# Patient Record
Sex: Male | Born: 1935 | Hispanic: Yes | Marital: Married | Smoking: Never smoker
Health system: Southern US, Community
[De-identification: ages and names within clinical notes are randomized; demographics above are authoritative.]

## PROBLEM LIST (undated history)

## (undated) DIAGNOSIS — I2510 Atherosclerotic heart disease of native coronary artery without angina pectoris: Secondary | ICD-10-CM

## (undated) DIAGNOSIS — N189 Chronic kidney disease, unspecified: Secondary | ICD-10-CM

## (undated) DIAGNOSIS — H903 Sensorineural hearing loss, bilateral: Secondary | ICD-10-CM

## (undated) DIAGNOSIS — K219 Gastro-esophageal reflux disease without esophagitis: Secondary | ICD-10-CM

## (undated) DIAGNOSIS — I471 Supraventricular tachycardia, unspecified: Secondary | ICD-10-CM

## (undated) DIAGNOSIS — K4090 Unilateral inguinal hernia, without obstruction or gangrene, not specified as recurrent: Secondary | ICD-10-CM

## (undated) DIAGNOSIS — K429 Umbilical hernia without obstruction or gangrene: Secondary | ICD-10-CM

## (undated) DIAGNOSIS — M1712 Unilateral primary osteoarthritis, left knee: Secondary | ICD-10-CM

## (undated) DIAGNOSIS — I5030 Unspecified diastolic (congestive) heart failure: Secondary | ICD-10-CM

## (undated) DIAGNOSIS — I340 Nonrheumatic mitral (valve) insufficiency: Secondary | ICD-10-CM

## (undated) DIAGNOSIS — T847XXA Infection and inflammatory reaction due to other internal orthopedic prosthetic devices, implants and grafts, initial encounter: Secondary | ICD-10-CM

## (undated) DIAGNOSIS — Z95810 Presence of automatic (implantable) cardiac defibrillator: Secondary | ICD-10-CM

## (undated) DIAGNOSIS — I5189 Other ill-defined heart diseases: Secondary | ICD-10-CM

## (undated) DIAGNOSIS — K5790 Diverticulosis of intestine, part unspecified, without perforation or abscess without bleeding: Secondary | ICD-10-CM

## (undated) DIAGNOSIS — N281 Cyst of kidney, acquired: Secondary | ICD-10-CM

## (undated) DIAGNOSIS — Z87448 Personal history of other diseases of urinary system: Secondary | ICD-10-CM

## (undated) DIAGNOSIS — I428 Other cardiomyopathies: Secondary | ICD-10-CM

## (undated) DIAGNOSIS — R7303 Prediabetes: Secondary | ICD-10-CM

## (undated) DIAGNOSIS — M109 Gout, unspecified: Secondary | ICD-10-CM

## (undated) DIAGNOSIS — Z974 Presence of external hearing-aid: Secondary | ICD-10-CM

## (undated) DIAGNOSIS — Z7901 Long term (current) use of anticoagulants: Secondary | ICD-10-CM

## (undated) DIAGNOSIS — E785 Hyperlipidemia, unspecified: Secondary | ICD-10-CM

## (undated) DIAGNOSIS — I6789 Other cerebrovascular disease: Secondary | ICD-10-CM

## (undated) DIAGNOSIS — I700 Atherosclerosis of aorta: Secondary | ICD-10-CM

## (undated) DIAGNOSIS — L409 Psoriasis, unspecified: Secondary | ICD-10-CM

## (undated) DIAGNOSIS — M1990 Unspecified osteoarthritis, unspecified site: Secondary | ICD-10-CM

## (undated) DIAGNOSIS — N183 Chronic kidney disease, stage 3 unspecified: Secondary | ICD-10-CM

## (undated) DIAGNOSIS — E039 Hypothyroidism, unspecified: Secondary | ICD-10-CM

## (undated) DIAGNOSIS — I38 Endocarditis, valve unspecified: Secondary | ICD-10-CM

## (undated) DIAGNOSIS — I10 Essential (primary) hypertension: Secondary | ICD-10-CM

## (undated) DIAGNOSIS — I209 Angina pectoris, unspecified: Secondary | ICD-10-CM

## (undated) DIAGNOSIS — I491 Atrial premature depolarization: Secondary | ICD-10-CM

## (undated) DIAGNOSIS — G459 Transient cerebral ischemic attack, unspecified: Secondary | ICD-10-CM

## (undated) DIAGNOSIS — K4020 Bilateral inguinal hernia, without obstruction or gangrene, not specified as recurrent: Secondary | ICD-10-CM

## (undated) DIAGNOSIS — N400 Enlarged prostate without lower urinary tract symptoms: Secondary | ICD-10-CM

## (undated) HISTORY — PX: BUNIONECTOMY: SHX129

## (undated) HISTORY — PX: CATARACT EXTRACTION W/ INTRAOCULAR LENS  IMPLANT, BILATERAL: SHX1307

## (undated) HISTORY — DX: Hyperlipidemia, unspecified: E78.5

## (undated) HISTORY — DX: Personal history of other diseases of urinary system: Z87.448

## (undated) HISTORY — DX: Unspecified osteoarthritis, unspecified site: M19.90

## (undated) HISTORY — DX: Essential (primary) hypertension: I10

## (undated) HISTORY — PX: JOINT REPLACEMENT: SHX530

## (undated) HISTORY — PX: EYE SURGERY: SHX253

---

## 2012-10-06 DIAGNOSIS — R079 Chest pain, unspecified: Secondary | ICD-10-CM | POA: Insufficient documentation

## 2012-10-06 DIAGNOSIS — E785 Hyperlipidemia, unspecified: Secondary | ICD-10-CM | POA: Insufficient documentation

## 2012-10-09 DIAGNOSIS — I209 Angina pectoris, unspecified: Secondary | ICD-10-CM | POA: Insufficient documentation

## 2013-03-18 DIAGNOSIS — I2510 Atherosclerotic heart disease of native coronary artery without angina pectoris: Secondary | ICD-10-CM | POA: Insufficient documentation

## 2013-03-19 DIAGNOSIS — K219 Gastro-esophageal reflux disease without esophagitis: Secondary | ICD-10-CM | POA: Insufficient documentation

## 2013-11-18 DIAGNOSIS — N189 Chronic kidney disease, unspecified: Secondary | ICD-10-CM | POA: Insufficient documentation

## 2014-02-17 DIAGNOSIS — I491 Atrial premature depolarization: Secondary | ICD-10-CM | POA: Insufficient documentation

## 2014-02-17 DIAGNOSIS — R5382 Chronic fatigue, unspecified: Secondary | ICD-10-CM | POA: Insufficient documentation

## 2014-08-17 ENCOUNTER — Other Ambulatory Visit: Payer: Self-pay | Admitting: Unknown Physician Specialty

## 2014-08-17 DIAGNOSIS — I10 Essential (primary) hypertension: Secondary | ICD-10-CM

## 2014-08-17 DIAGNOSIS — N183 Chronic kidney disease, stage 3 unspecified: Secondary | ICD-10-CM

## 2014-08-18 ENCOUNTER — Ambulatory Visit (INDEPENDENT_AMBULATORY_CARE_PROVIDER_SITE_OTHER): Payer: Self-pay | Admitting: Unknown Physician Specialty

## 2014-08-18 ENCOUNTER — Encounter: Payer: Self-pay | Admitting: Unknown Physician Specialty

## 2014-08-18 VITALS — BP 144/83 | HR 64 | Temp 98.2°F | Ht 65.0 in | Wt 184.0 lb

## 2014-08-18 DIAGNOSIS — I10 Essential (primary) hypertension: Secondary | ICD-10-CM | POA: Insufficient documentation

## 2014-08-18 DIAGNOSIS — M545 Low back pain: Secondary | ICD-10-CM

## 2014-08-18 DIAGNOSIS — L409 Psoriasis, unspecified: Secondary | ICD-10-CM | POA: Insufficient documentation

## 2014-08-18 DIAGNOSIS — E785 Hyperlipidemia, unspecified: Secondary | ICD-10-CM

## 2014-08-18 DIAGNOSIS — N183 Chronic kidney disease, stage 3 unspecified: Secondary | ICD-10-CM | POA: Insufficient documentation

## 2014-08-18 HISTORY — DX: Hyperlipidemia, unspecified: E78.5

## 2014-08-18 LAB — MICROALBUMIN, URINE WAIVED
Creatinine, Urine Waived: 100 mg/dL (ref 10–300)
Microalb, Ur Waived: 80 mg/L — ABNORMAL HIGH (ref 0–19)

## 2014-08-18 MED ORDER — LISINOPRIL 5 MG PO TABS: 5.0000 mg | ORAL_TABLET | Freq: Every day | ORAL | Status: DC

## 2014-08-18 NOTE — Assessment & Plan Note (Signed)
BP is borderline.  Will need to add ACE to help with CKD

## 2014-08-18 NOTE — Patient Instructions (Addendum)
Start Lisinopril 5 mg QD to improve blood pressure control and protect your kidneys.  Recheck in 1 month for blood pressure and tolerance to medication.     Dolor de espalda en el adulto (Back Pain, Adult)  El dolor de cintura es frecuente. Aproximadamente 1 de cada 5 personas lo sufren.La causa rara vez pone en peligro la vida. Con frecuencia mejora luego de algn tiempo.Alrededor de la mitad de las personas que sufren un inicio sbito de dolor de cintura, se sentirn mejor luego de 2 semanas. Aproximadamente 8 de cada 10 se sentirn mejor luego de 6 semanas.  CAUSAS  Algunas causas comunes son:   Distensin de los msculos o ligamentos que sostienen la columna vertebral.  Desgaste (degeneracin) de los discos vertebrales.  Artritis.  Traumatismos directos en la espalda. DIAGNSTICO  La mayor parte de las veces, la causa directa no se conoce.Sin embargo, Chief Technology Officer puede tratarse efectivamente an cuando no se Best boy.Una de las formas ms precisas de asegurar que la causa del dolor no constituye un peligro es responder a las preguntas del mdico acerca de su salud y sus sntomas. Si el mdico necesita ms informacin, podr indicar anlisis de laboratorio o Education officer, environmental un diagnstico por imgenes (radiografas o Health visitor).Sin embargo, aunque las [employer] modificaciones, generalmente no es necesaria la [place].  INSTRUCCIONES PARA EL CUIDADO EN EL HOGAR  En algunas personas, el dolor de espalda vuelve.Como rara vez es peligroso, los pacientes pueden aprender a Interior and spatial designer.   Mantngase activo. Si permanece sentado o de pie mucho tiempo en el mismo lugar, se tensiona la espalda.  No se siente, maneje ni se quede parado en un mismo lugar por ms de 30 minutos. Realice caminatas cortas en superficies planas ni bien el dolor haya cedido. Trate de Copy tiempo que camina .  No se quede en la cama.Si hace reposo durante ms de 1 o 2 das,  puede Estate agent.  No evite los ejercicios ni el trabajo.El cuerpo est hecho para moverse.No es peligroso estar [place], aunque le duela la espalda.La espalda se curar ms rpido si contina sus actividades antes de que el dolor se vaya.  Preste atencin a su cuerpo cuando se incline y se levante. Muchas personas sienten menos molestias cuando levantan objetos si doblan las rodillas, mantienen la carga cerca del cuerpo y evitan torcerse. Generalmente, las posiciones ms cmodas son las que ejercen menos tensin en la espalda en recuperacin.  Encuentre una posicin cmoda para dormir. Utilice un colchn firme y recustese de [place]. Doble ligeramente sus rodillas. Si se recuesta sobre su espalda, coloque una almohada debajo de sus rodillas.  Tome slo medicamentos de venta libre o recetados, segn las indicaciones del mdico. Los medicamentos de venta libre para Primary school teacher y reducir Futures trader, son los que en general ms ayudan.El mdico podr prescribirle relajantes musculares.Estos medicamentos calman el dolor de modo que pueda retornar a sus actividades normales y a Investment banker, operational.  Aplique hielo sobre la zona lesionada.  Ponga el hielo en una bolsa plstica.  Colquese una toalla entre la piel y la bolsa de hielo.  Deje la bolsa de hielo durante 15 a 20 minutos 3 a 4 veces por da, durante los primeros 2  3 das. Luego podr alternar Eusebio Me calor y hielo para reducir Chief Technology Officer y los espasmos.  Consulte a su mdico si puede tratar de hacer ejercicios para la espalda y recibir un masaje suave. Pueden ser  beneficiosos.  Evite sentirse ansioso o estresado.El estrs aumenta la tensin muscular y puede empeorar el dolor de espalda.Es importante reconocer cuando est ansioso o estresado y aprender la forma de controlarlos.El ejercicio es una gran opcin. SOLICITE ATENCIN MDICA SI:   Siente un dolor que no se alivia con reposo o medicamentos.  El  dolor no mejora en 1 semana.  Desarrolla nuevos sntomas.  No se siente bien en general. SOLICITE ATENCIN MDICA DE INMEDIATO SI:  Siente un dolor que se irradia desde la espalda hacia sus piernas.  Desarrolla nuevos problemas en el intestino o la vejiga.  Siente debilidad o adormecimiento inusual en sus brazos o piernas.  Presenta nuseas o vmitos.  Presenta dolor abdominal.  Se siente desfalleciente. Document Released: 02/27/2005 Document Revised: 08/29/2011 [facility] Patient Information 2015 [place], [place]. This information is not intended to replace advice given to you by your health care provider. Make sure you discuss any questions you have with your health care provider. Ejercicios para la espalda (Back Exercises) Estos ejercicios ayudan a tratar y a prevenir lesiones en la espalda. El objetivo es aumentar la fuerza en los msculos del vientre (abdomen) y de la espalda. Estos ejercicios tambin lo ayudarn a mejorar la flexibilidad. Comience a realizar estos ejercicios cuando el mdico se lo indique. CUIDADOS EN EL HOGAR Los ejercicios para la espalda incluyen: Inclinacin de la pelvis.  Recustese sobre la espalda con las rodillas flexionadas. Incline la pelvis hasta que la parte inferior de la espalda se apoye en el piso. Mantenga esta posicin durante 5 a 10 segundos. Repita este ejercicio 5 a 10 veces. Rodilla al pecho.  Empuje con la rodilla contra el pecho y [place] posicin durante 20 a 30 segundos. Repita con la otra pierna. Esto puede realizarlo con la otra pierna extendida o flexionada, del modo en que se sienta ms cmodo. Luego presione ambas rodillas contra el pecho. Abdominales.  Doble las rodillas a 90 grados. Comience doblando la pelvis y haga un abdominal parcial y en forma lenta. Slo eleve la parte superior a 30  45 grados del suelo. Emplee al [employer] 2 y 3 segundos para cada abdominal. No haga los abdominales con las rodillas extendidas. Si  hacer abdominales parciales le resulta difcil, simplemente haga el ejercicio pero slo endureciendo los msculos del vientre(abdomen) y manteniendo segn la indicacin. Elevar la cadera.  Recustese sobre la espalda con las rodillas flexionadas a 90 grados. Presione con los pies y los hombros a medida que eleva las caderas a 5 cm del suelo. Mantenga durante 10 segundos y repita 5 a 10 veces. Arquear la espalda.  Acustese [employer]. Levntese apoyando los codos doblados. Presione lentamente con las manos, formando un arco con la zona inferior de la espalda. Repita entre 3 y 5 veces. Elevar los hombros.  Acustese boca abajo con los brazos a los lados del cuerpo. Presione las caderas y Dance movement psychotherapist torso contra el suelo mientras eleva lentamente la cabeza y los hombros del suelo. No exagere al [employer] ejercicios. Tenga cuidado al principio. Los ejercicios pueden causar algunas molestias leves en la espalda. Si el dolor dura ms de 15 minutos, detenga los ejercicios hasta que consulte al mdico. Los problemas en la espalda mejoran de L[place] lenta con esta terapia.  Document Released: 06/14/2010 Document Revised: 05/22/2011 [facility] Patient Information 2015 [place], [place]. This information is not intended to replace advice given to you by your health care provider. Make sure you discuss any questions you have with your  health care provider.  

## 2014-08-18 NOTE — Assessment & Plan Note (Signed)
Add ACE.  Check CMP

## 2014-08-18 NOTE — Progress Notes (Signed)
   BP 144/83 mmHg  Pulse 64  Temp(Src) 98.2 F (36.8 C) (Oral)  Ht 5\' 5"  (1.651 m)  Wt 184 lb (83.462 kg)  BMI 30.62 kg/m2  SpO2 96%   Subjective:    Patient ID: Gerald Hodges, male    DOB: October 26, 1935, 79 y.o.   MRN: [id number]  HPI: Gerald Hodges is a 79 y.o. male presenting on 08/18/2014 for Follow-up  [name] is here with his friend who helpt to interpret.    HYPERTENSION / HYPERLIPIDEMIA Satisfied with current treatment? yes Duration of hypertension: chronic BP monitoring frequency: not checking BP range:  BP medication side effects: no Past BP meds: none, amlodipine/benazepril, atenolol, benazepril, benazepril/HCTZ, bisoprolol (bystolic), carvedilol, chlorthalidone, clonidine, diltiazem, exforge HCT, HCTZ, irbesartan (avapro), labetalol, lisinopril, lisinopril-HCTZ, losartan (cozaar), methyldopa, nifedipine, olmesartan (benicar), olmesartan-HCTZ, quinapril, ramipril, spironalactone, tekturna, valsartan, valsartan-HCTZ and verapamil Duration of hyperlipidemia: chronic Cholesterol medication side effects: no Cholesterol supplements: fish oil, niacin and red yeast rice Past cholesterol medications: none, lovastatin (mevacor), pravastatin (pravachol), rosuvastatin (crestor), simvastatin (zocor), vytorin, fenofibrate (tricor), gemfibrozil, ezetimide (zetia), niaspan and lovaza Medication compliance: excellent compliance Aspirin: yes Recent stressors: yes Recurrent headaches: no Visual changes: no Palpitations: no Dyspnea: no Chest pain: no Lower extremity edema: no Dizzy/lightheaded: no   Note that currently is not on an ACE or ARB despite GFR of 47.  He is a new patient to me   Relevant past medical, surgical, family and social history reviewed and updated as indicated. Interim medical history since our last visit reviewed. Allergies and medications reviewed and updated.  Back pain: sometimes when he wakes up.  Has a lot of back pain. Improves through  the dayl    Review of Systems  Per HPI unless specifically indicated above     Objective:    BP 144/83 mmHg  Pulse 64  Temp(Src) 98.2 F (36.8 C) (Oral)  Ht 5\' 5"  (1.651 m)  Wt 184 lb (83.462 kg)  BMI 30.62 kg/m2  SpO2 96%  Wt Readings from Last 3 Encounters:  08/18/14 184 lb (83.462 kg)  10/21/13 183 lb (83.008 kg)    Physical Exam  Constitutional: He is oriented to person, place, and time. He appears well-developed and well-nourished.  HENT:  Head: Normocephalic and atraumatic.  Cardiovascular: Normal rate and normal heart sounds.   Pulmonary/Chest: Effort normal and breath sounds normal.  Neurological: He is oriented to person, place, and time.  Skin: Skin is warm.  Psychiatric: He has a normal mood and affect. His behavior is normal.          Assessment & Plan:   1. Essential hypertension, benign Borderline BP.  Add Ace for kidney protection  2. Chronic kidney disease, stage 3 Check CMP  3. Hyperlipemia Check Lipid panel  - Comprehensive metabolic panel - Lipid panel - Uric acid - Microalbumin / creatinine urine ratio  4.  Back pain Excercises and pt ed given     Follow up plan:  1 month to check BMP and BP

## 2014-08-18 NOTE — Assessment & Plan Note (Signed)
Check Lipid panel 

## 2014-08-19 LAB — COMPREHENSIVE METABOLIC PANEL
ALT: 17 IU/L (ref 0–44)
AST: 20 IU/L (ref 0–40)
Albumin/Globulin Ratio: 1.9 (ref 1.1–2.5)
Albumin: 4.7 g/dL (ref 3.5–4.8)
Alkaline Phosphatase: 70 IU/L (ref 39–117)
BUN/Creatinine Ratio: 15 (ref 10–22)
BUN: 21 mg/dL (ref 8–27)
Bilirubin Total: 0.8 mg/dL (ref 0.0–1.2)
CO2: 26 mmol/L (ref 18–29)
Calcium: 9.8 mg/dL (ref 8.6–10.2)
Chloride: 97 mmol/L (ref 97–108)
Creatinine, Ser: 1.39 mg/dL — ABNORMAL HIGH (ref 0.76–1.27)
GFR calc Af Amer: 57 mL/min/{1.73_m2} — ABNORMAL LOW (ref >59)
GFR calc non Af Amer: 49 mL/min/{1.73_m2} — ABNORMAL LOW (ref >59)
Globulin, Total: 2.5 g/dL (ref 1.5–4.5)
Glucose: 89 mg/dL (ref 65–99)
Potassium: 4.1 mmol/L (ref 3.5–5.2)
Sodium: 142 mmol/L (ref 134–144)
Total Protein: 7.2 g/dL (ref 6.0–8.5)

## 2014-08-19 LAB — LIPID PANEL
Chol/HDL Ratio: 3.4 ratio units (ref 0.0–5.0)
Cholesterol, Total: 124 mg/dL (ref 100–199)
HDL: 36 mg/dL — ABNORMAL LOW (ref >39)
LDL Calculated: 48 mg/dL (ref 0–99)
Triglycerides: 199 mg/dL — ABNORMAL HIGH (ref 0–149)
VLDL Cholesterol Cal: 40 mg/dL (ref 5–40)

## 2014-08-19 LAB — URIC ACID: Uric Acid: 11.5 mg/dL — ABNORMAL HIGH (ref 3.7–8.6)

## 2014-09-29 ENCOUNTER — Ambulatory Visit: Payer: Self-pay | Admitting: Unknown Physician Specialty

## 2014-10-09 ENCOUNTER — Encounter: Payer: Self-pay | Admitting: Unknown Physician Specialty

## 2014-10-09 ENCOUNTER — Ambulatory Visit (INDEPENDENT_AMBULATORY_CARE_PROVIDER_SITE_OTHER): Payer: Self-pay | Admitting: Unknown Physician Specialty

## 2014-10-09 VITALS — BP 151/84 | HR 64 | Temp 98.3°F | Ht 65.5 in | Wt 184.0 lb

## 2014-10-09 DIAGNOSIS — I129 Hypertensive chronic kidney disease with stage 1 through stage 4 chronic kidney disease, or unspecified chronic kidney disease: Secondary | ICD-10-CM

## 2014-10-09 DIAGNOSIS — E785 Hyperlipidemia, unspecified: Secondary | ICD-10-CM

## 2014-10-09 DIAGNOSIS — N183 Chronic kidney disease, stage 3 unspecified: Secondary | ICD-10-CM

## 2014-10-09 DIAGNOSIS — N189 Chronic kidney disease, unspecified: Secondary | ICD-10-CM

## 2014-10-09 DIAGNOSIS — M1A3790 Chronic gout due to renal impairment, unspecified ankle and foot, without tophus (tophi): Secondary | ICD-10-CM

## 2014-10-09 DIAGNOSIS — M109 Gout, unspecified: Secondary | ICD-10-CM | POA: Insufficient documentation

## 2014-10-09 MED ORDER — OMEPRAZOLE 20 MG PO CPDR: 20.0000 mg | DELAYED_RELEASE_CAPSULE | Freq: Every day | ORAL | Status: DC

## 2014-10-09 MED ORDER — CARVEDILOL 6.25 MG PO TABS: 6.2500 mg | ORAL_TABLET | Freq: Two times a day (BID) | ORAL | Status: DC

## 2014-10-09 MED ORDER — NITROGLYCERIN 0.4 MG SL SUBL: 0.4000 mg | SUBLINGUAL_TABLET | SUBLINGUAL | Status: DC | PRN

## 2014-10-09 MED ORDER — ALLOPURINOL 100 MG PO TABS: 100.0000 mg | ORAL_TABLET | Freq: Every day | ORAL | Status: DC

## 2014-10-09 MED ORDER — COLCHICINE 0.6 MG PO TABS
0.6000 mg | ORAL_TABLET | Freq: Every day | ORAL | Status: DC
Start: 2014-10-09 — End: 2015-06-28

## 2014-10-09 MED ORDER — AMLODIPINE BESYLATE 10 MG PO TABS: 10.0000 mg | ORAL_TABLET | Freq: Every day | ORAL | Status: DC

## 2014-10-09 MED ORDER — ISOSORBIDE MONONITRATE ER 60 MG PO TB24: 60.0000 mg | ORAL_TABLET | Freq: Every day | ORAL | Status: DC

## 2014-10-09 MED ORDER — ATORVASTATIN CALCIUM 20 MG PO TABS
20.0000 mg | ORAL_TABLET | Freq: Every day | ORAL | Status: DC
Start: 2014-10-09 — End: 2015-06-28

## 2014-10-09 NOTE — Progress Notes (Signed)
BP 151/84 mmHg  Pulse 64  Temp(Src) 98.3 F (36.8 C)  Ht 5' 5.5" (1.664 m)  Wt 184 lb (83.462 kg)  BMI 30.14 kg/m2  SpO2 98%   Subjective:    Patient ID: Gerald Hodges, male    DOB: 1935-05-26, 79 y.o.   MRN: [id number]  HPI: Gerald Hodges is a 79 y.o. male presenting on 10/09/2014 for Hyperlipidemia; Hypertension; and Medication Refill  [name] is here with a friend and interperter.    HYPERTENSION / HYPERLIPIDEMIA  While here, he brought in Furosemide 40 mg that he takes once a day that was given in [place]  He brought in 3 medicines which we know he is taking but his friend feels he has more medications at home that he did not bring in.  Those medications are Allopurinol, lisinopril, and Furosemide.  Probably taking Amlodipine and Carvedilol  Satisfied with current treatment? yes Duration of hypertension: chronic BP monitoring frequency: not checking BP range:  BP medication side effects: no Past BP meds: none, amlodipine/benazepril, atenolol, benazepril, benazepril/HCTZ, bisoprolol (bystolic), carvedilol, chlorthalidone, clonidine, diltiazem, exforge HCT, HCTZ, irbesartan (avapro), labetalol, lisinopril, lisinopril-HCTZ, losartan (cozaar), methyldopa, nifedipine, olmesartan (benicar), olmesartan-HCTZ, quinapril, ramipril, spironalactone, tekturna, valsartan, valsartan-HCTZ and verapamil Duration of hyperlipidemia: chronic Cholesterol medication side effects: no Cholesterol supplements: fish oil, niacin and red yeast rice Past cholesterol medications: none, lovastatin (mevacor), pravastatin (pravachol), rosuvastatin (crestor), simvastatin (zocor), vytorin, fenofibrate (tricor), gemfibrozil, ezetimide (zetia), niaspan and lovaza Medication compliance: excellent compliance Aspirin: yes Recent stressors: yes Recurrent headaches: no Visual changes: no Palpitations: no Dyspnea: no Chest pain: no Lower extremity edema: no Dizzy/lightheaded: no    CAD Pt with probable CAD.  He has Imdur in his chart and takes Nitroglycerin maybe once int he last month.  I'm not sure he is taking Imdur.   Gout Taking Allopurinol with a gout attack about 15 days ago.  Colchicine is in chart but not sure he is taking.     Relevant past medical, surgical, family and social history reviewed and updated as indicated. Interim medical history since our last visit reviewed. Allergies and medications reviewed and updated.  Back pain:  Middle of the back still hurts a little bit.    Review of Systems  Per HPI unless specifically indicated above     Objective:    BP 151/84 mmHg  Pulse 64  Temp(Src) 98.3 F (36.8 C)  Ht 5' 5.5" (1.664 m)  Wt 184 lb (83.462 kg)  BMI 30.14 kg/m2  SpO2 98%  Wt Readings from Last 3 Encounters:  10/09/14 184 lb (83.462 kg)  08/18/14 184 lb (83.462 kg)  10/21/13 183 lb (83.008 kg)    Physical Exam  Constitutional: He is oriented to person, place, and time. He appears well-developed and well-nourished.  HENT:  Head: Normocephalic and atraumatic.  Cardiovascular: Normal rate and normal heart sounds.   Pulmonary/Chest: Effort normal and breath sounds normal.  Neurological: He is oriented to person, place, and time.  Skin: Skin is warm.  Psychiatric: He has a normal mood and affect. His behavior is normal.    Reviewed his last labs showing a GFR of 49 with normal electrolytes.       Assessment & Plan:   Problem List Items Addressed This Visit      Unprioritized   Chronic kidney disease, stage III (moderate)    Unable to see Nephrology due to insurance.  I will continue to monitor every 3 months.  Continue Furosemide  Hyperlipemia    LDL 48 last visit.  Continue present statin dose      Relevant Medications   atorvastatin (LIPITOR) 20 MG tablet   amLODipine (NORVASC) 10 MG tablet   carvedilol (COREG) 6.25 MG tablet   isosorbide mononitrate (IMDUR) 60 MG 24 hr tablet   nitroGLYCERIN (NITROSTAT) 0.4  MG SL tablet   Benign hypertension with chronic kidney disease - Primary    He seems to be doing well on present medications.  Kidney function is stable.  Will recheck a CMP today      Relevant Medications   atorvastatin (LIPITOR) 20 MG tablet   amLODipine (NORVASC) 10 MG tablet   carvedilol (COREG) 6.25 MG tablet   isosorbide mononitrate (IMDUR) 60 MG 24 hr tablet   nitroGLYCERIN (NITROSTAT) 0.4 MG SL tablet   Gout    Continue present medications           Follow up plan:  Recheck 4 weeks with all of his medications  Check CMP next visit.  Will check every 3 months.  Unable to afford Cardiology or Nephrology at this time-

## 2014-10-09 NOTE — Assessment & Plan Note (Signed)
LDL 48 last visit.  Continue present statin dose

## 2014-10-09 NOTE — Assessment & Plan Note (Signed)
Continue present medications. 

## 2014-10-09 NOTE — Patient Instructions (Addendum)
DASH Eating Plan DASH stands for "Dietary Approaches to Stop Hypertension." The DASH eating plan is a healthy eating plan that has been shown to reduce high blood pressure (hypertension). Additional health benefits may include reducing the risk of type 2 diabetes mellitus, heart disease, and stroke. The DASH eating plan may also help with weight loss. WHAT DO I NEED TO KNOW ABOUT THE DASH EATING PLAN? For the DASH eating plan, you will follow these general guidelines:  Choose foods with a percent daily value for sodium of less than 5% (as listed on the food label).  Use salt-free seasonings or herbs instead of table salt or sea salt.  Check with your health care provider or pharmacist before using salt substitutes.  Eat lower-sodium products, often labeled as "lower sodium" or "no salt added."  Eat fresh foods.  Eat more vegetables, fruits, and low-fat dairy products.  Choose whole grains. Look for the word "whole" as the first word in the ingredient list.  Choose fish and skinless chicken or turkey more often than red meat. Limit fish, poultry, and meat to 6 oz (170 g) each day.  Limit sweets, desserts, sugars, and sugary drinks.  Choose heart-healthy fats.  Limit cheese to 1 oz (28 g) per day.  Eat more home-cooked food and less restaurant, buffet, and fast food.  Limit fried foods.  Cook foods using methods other than frying.  Limit canned vegetables. If you do use them, rinse them well to decrease the sodium.  When eating at a restaurant, ask that your food be prepared with less salt, or no salt if possible. WHAT FOODS CAN I EAT? Seek help from a dietitian for individual calorie needs. Grains Whole grain or whole wheat bread. Brown rice. Whole grain or whole wheat pasta. Quinoa, bulgur, and whole grain cereals. Low-sodium cereals. Corn or whole wheat flour tortillas. Whole grain cornbread. Whole grain crackers. Low-sodium crackers. Vegetables Fresh or frozen vegetables  (raw, steamed, roasted, or grilled). Low-sodium or reduced-sodium tomato and vegetable juices. Low-sodium or reduced-sodium tomato sauce and paste. Low-sodium or reduced-sodium canned vegetables.  Fruits All fresh, canned (in natural juice), or frozen fruits. Meat and Other Protein Products Ground beef (85% or leaner), grass-fed beef, or beef trimmed of fat. Skinless chicken or turkey. Ground chicken or turkey. Pork trimmed of fat. All fish and seafood. Eggs. Dried beans, peas, or lentils. Unsalted nuts and seeds. Unsalted canned beans. Dairy Low-fat dairy products, such as skim or 1% milk, 2% or reduced-fat cheeses, low-fat ricotta or cottage cheese, or plain low-fat yogurt. Low-sodium or reduced-sodium cheeses. Fats and Oils Tub margarines without trans fats. Light or reduced-fat mayonnaise and salad dressings (reduced sodium). Avocado. Safflower, olive, or canola oils. Natural peanut or almond butter. Other Unsalted popcorn and pretzels. The items listed above may not be a complete list of recommended foods or beverages. Contact your dietitian for more options. WHAT FOODS ARE NOT RECOMMENDED? Grains White bread. White pasta. White rice. Refined cornbread. Bagels and croissants. Crackers that contain trans fat. Vegetables Creamed or fried vegetables. Vegetables in a cheese sauce. Regular canned vegetables. Regular canned tomato sauce and paste. Regular tomato and vegetable juices. Fruits Dried fruits. Canned fruit in light or heavy syrup. Fruit juice. Meat and Other Protein Products Fatty cuts of meat. Ribs, chicken wings, bacon, sausage, bologna, salami, chitterlings, fatback, hot dogs, bratwurst, and packaged luncheon meats. Salted nuts and seeds. Canned beans with salt. Dairy Whole or 2% milk, cream, half-and-half, and cream cheese. Whole-fat or sweetened yogurt. Full-fat   cheeses or blue cheese. Nondairy creamers and whipped toppings. Processed cheese, cheese spreads, or cheese  curds. Condiments Onion and garlic salt, seasoned salt, table salt, and sea salt. Canned and packaged gravies. Worcestershire sauce. Tartar sauce. Barbecue sauce. Teriyaki sauce. Soy sauce, including reduced sodium. Steak sauce. Fish sauce. Oyster sauce. Cocktail sauce. Horseradish. Ketchup and mustard. Meat flavorings and tenderizers. Bouillon cubes. Hot sauce. Tabasco sauce. Marinades. Taco seasonings. Relishes. Fats and Oils Butter, stick margarine, lard, shortening, ghee, and bacon fat. Coconut, palm kernel, or palm oils. Regular salad dressings. Other Pickles and olives. Salted popcorn and pretzels. The items listed above may not be a complete list of foods and beverages to avoid. Contact your dietitian for more information. WHERE CAN I FIND MORE INFORMATION? National Heart, Lung, and Blood Institute: [email or web address] Document Released: 02/16/2011 Document Revised: 07/14/2013 Document Reviewed: 01/01/2013 [facility] Patient Information 2015 [place], [place]. This information is not intended to replace advice given to you by your health care provider. Make sure you discuss any questions you have with your health care provider.  Bring all your medications in next visit [facility] for lungs

## 2014-10-09 NOTE — Assessment & Plan Note (Addendum)
Unable to see Nephrology due to insurance.  I will continue to monitor every 3 months.  Continue Furosemide

## 2014-10-09 NOTE — Assessment & Plan Note (Signed)
He seems to be doing well on present medications.  Kidney function is stable.  Will recheck a CMP today

## 2014-11-10 ENCOUNTER — Ambulatory Visit: Payer: Self-pay | Admitting: Unknown Physician Specialty

## 2015-06-28 ENCOUNTER — Encounter: Payer: Self-pay | Admitting: Unknown Physician Specialty

## 2015-06-28 ENCOUNTER — Other Ambulatory Visit: Payer: Self-pay | Admitting: Unknown Physician Specialty

## 2015-06-28 ENCOUNTER — Ambulatory Visit (INDEPENDENT_AMBULATORY_CARE_PROVIDER_SITE_OTHER): Payer: Medicaid Other | Admitting: Unknown Physician Specialty

## 2015-06-28 VITALS — BP 173/89 | HR 69 | Temp 98.1°F | Ht 64.1 in | Wt 190.0 lb

## 2015-06-28 DIAGNOSIS — N183 Chronic kidney disease, stage 3 unspecified: Secondary | ICD-10-CM

## 2015-06-28 DIAGNOSIS — I208 Other forms of angina pectoris: Secondary | ICD-10-CM | POA: Diagnosis not present

## 2015-06-28 DIAGNOSIS — M21612 Bunion of left foot: Secondary | ICD-10-CM | POA: Diagnosis not present

## 2015-06-28 DIAGNOSIS — E785 Hyperlipidemia, unspecified: Secondary | ICD-10-CM | POA: Diagnosis not present

## 2015-06-28 DIAGNOSIS — M21619 Bunion of unspecified foot: Secondary | ICD-10-CM | POA: Insufficient documentation

## 2015-06-28 DIAGNOSIS — IMO0001 Reserved for inherently not codable concepts without codable children: Secondary | ICD-10-CM

## 2015-06-28 DIAGNOSIS — I10 Essential (primary) hypertension: Secondary | ICD-10-CM

## 2015-06-28 DIAGNOSIS — T814XXA Infection following a procedure, initial encounter: Principal | ICD-10-CM

## 2015-06-28 DIAGNOSIS — I2089 Other forms of angina pectoris: Secondary | ICD-10-CM | POA: Insufficient documentation

## 2015-06-28 DIAGNOSIS — S81801A Unspecified open wound, right lower leg, initial encounter: Secondary | ICD-10-CM

## 2015-06-28 DIAGNOSIS — S8982XS Other specified injuries of left lower leg, sequela: Secondary | ICD-10-CM | POA: Diagnosis not present

## 2015-06-28 MED ORDER — AMLODIPINE BESYLATE 10 MG PO TABS: 10.0000 mg | ORAL_TABLET | Freq: Every day | ORAL | Status: DC

## 2015-06-28 MED ORDER — CARVEDILOL 6.25 MG PO TABS: 6.2500 mg | ORAL_TABLET | Freq: Two times a day (BID) | ORAL | Status: DC

## 2015-06-28 MED ORDER — ATORVASTATIN CALCIUM 20 MG PO TABS: 20.0000 mg | ORAL_TABLET | Freq: Every day | ORAL | Status: DC

## 2015-06-28 MED ORDER — FUROSEMIDE 40 MG PO TABS: 40.0000 mg | ORAL_TABLET | Freq: Two times a day (BID) | ORAL | Status: DC

## 2015-06-28 MED ORDER — ALLOPURINOL 100 MG PO TABS: 100.0000 mg | ORAL_TABLET | Freq: Every day | ORAL | Status: DC

## 2015-06-28 MED ORDER — ISOSORBIDE MONONITRATE ER 60 MG PO TB24: 60.0000 mg | ORAL_TABLET | Freq: Every day | ORAL | Status: DC

## 2015-06-28 MED ORDER — LISINOPRIL 5 MG PO TABS: 5.0000 mg | ORAL_TABLET | Freq: Every day | ORAL | Status: DC

## 2015-06-28 MED ORDER — COLCHICINE 0.6 MG PO TABS: 0.6000 mg | ORAL_TABLET | Freq: Every day | ORAL | Status: DC

## 2015-06-28 NOTE — Assessment & Plan Note (Signed)
Poor control.  Will restart Lisinopril and Lasix

## 2015-06-28 NOTE — Progress Notes (Signed)
BP 173/89 mmHg  Pulse 69  Temp(Src) 98.1 F (36.7 C)  Ht 5' 4.1" (1.628 m)  Wt 190 lb (86.183 kg)  BMI 32.52 kg/m2  SpO2 97%   Subjective:    Patient ID: Gerald Hodges, male    DOB: 1936-01-17, 80 y.o.   MRN: [id number]  HPI: Gerald Hodges is a 80 y.o. male  Chief Complaint  Patient presents with  . Hyperlipidemia  . Hypertension   Pt is here with family member who is doing interpretation.    Hypertension/hyperlipidemia This is a patient who is lost to f/u who is not taking many of his medications.  Sees cardiology.  Is out of his cholesterol, Lasix, and BP meds. Appt is pending with cardiology.  Having intermittent chest pain which stops with rest.  No SOB  Leg wound  Hit left leg with hammer.  Has a wound for about 2 weeks  Relevant past medical, surgical, family and social history reviewed and updated as indicated. Interim medical history since our last visit reviewed. Allergies and medications reviewed and updated.  Review of Systems  Constitutional: Negative.   HENT: Negative.   Eyes: Negative.   Respiratory: Negative.   Cardiovascular: Positive for chest pain.  Gastrointestinal: Negative.   Endocrine: Negative.   Genitourinary:       Some pain in pelvic area with urination  Skin:       Left lower leg wound  Psychiatric/Behavioral: Negative.   Wants a bunion removed  Per HPI unless specifically indicated above     Objective:    BP 173/89 mmHg  Pulse 69  Temp(Src) 98.1 F (36.7 C)  Ht 5' 4.1" (1.628 m)  Wt 190 lb (86.183 kg)  BMI 32.52 kg/m2  SpO2 97%  Wt Readings from Last 3 Encounters:  06/28/15 190 lb (86.183 kg)  10/09/14 184 lb (83.462 kg)  08/18/14 184 lb (83.462 kg)    Physical Exam  Constitutional: He is oriented to person, place, and time. He appears well-developed and well-nourished. No distress.  HENT:  Head: Normocephalic and atraumatic.  Eyes: Conjunctivae and lids are normal. Right eye exhibits no discharge. Left eye  exhibits no discharge. No scleral icterus.  Neck: Normal range of motion. Neck supple. No JVD present. Carotid bruit is not present.  Cardiovascular: Normal rate, regular rhythm and normal heart sounds.   Pulmonary/Chest: Effort normal and breath sounds normal. No respiratory distress.  Abdominal: Normal appearance. There is no splenomegaly or hepatomegaly.  Musculoskeletal: Normal range of motion.  Neurological: He is alert and oriented to person, place, and time.  Skin: Skin is warm, dry and intact. Lesion noted. No rash noted. No pallor.  Left lower leg lesion with eschar  Psychiatric: He has a normal mood and affect. His behavior is normal. Judgment and thought content normal.    Results for orders placed or performed in visit on 08/18/14  Comprehensive metabolic panel  Result Value Ref Range   Glucose 89 65 - 99 mg/dL   BUN 21 8 - 27 mg/dL   Creatinine, Ser 6.62 (H) 0.76 - 1.27 mg/dL   GFR calc non Af Amer 49 (L) >59 mL/min/1.73   GFR calc Af Amer 57 (L) >59 mL/min/1.73   BUN/Creatinine Ratio 15 10 - 22   Sodium 142 134 - 144 mmol/L   Potassium 4.1 3.5 - 5.2 mmol/L   Chloride 97 97 - 108 mmol/L   CO2 26 18 - 29 mmol/L   Calcium 9.8 8.6 - 10.2 mg/dL  Total Protein 7.2 6.0 - 8.5 g/dL   Albumin 4.7 3.5 - 4.8 g/dL   Globulin, Total 2.5 1.5 - 4.5 g/dL   Albumin/Globulin Ratio 1.9 1.1 - 2.5   Bilirubin Total 0.8 0.0 - 1.2 mg/dL   Alkaline Phosphatase 70 39 - 117 IU/L   AST 20 0 - 40 IU/L   ALT 17 0 - 44 IU/L  Lipid panel  Result Value Ref Range   Cholesterol, Total 124 100 - 199 mg/dL   Triglycerides 161 (H) 0 - 149 mg/dL   HDL 36 (L) >09 mg/dL   VLDL Cholesterol Cal 40 5 - 40 mg/dL   LDL Calculated 48 0 - 99 mg/dL   Chol/HDL Ratio 3.4 0.0 - 5.0 ratio units  Uric acid  Result Value Ref Range   Uric Acid 11.5 (H) 3.7 - 8.6 mg/dL  Microalbumin, Urine Waived  Result Value Ref Range   Microalb, Ur Waived 80 (H) 0 - 19 mg/L   Creatinine, Urine Waived 100 10 - 300 mg/dL    Microalb/Creat Ratio 30-300 (H) <30 mg/g      Assessment & Plan:   Problem List Items Addressed This Visit      Unprioritized   Bunion of unspecified foot   Relevant Orders   Ambulatory referral to Podiatry   Chronic kidney disease, stage III (moderate)    Check CMP.  Improve BP control      Relevant Orders   CBC   Essential hypertension, benign    Poor control.  Will restart Lisinopril and Lasix      Relevant Medications   amLODipine (NORVASC) 10 MG tablet   atorvastatin (LIPITOR) 20 MG tablet   carvedilol (COREG) 6.25 MG tablet   furosemide (LASIX) 40 MG tablet   isosorbide mononitrate (IMDUR) 60 MG 24 hr tablet   lisinopril (PRINIVIL,ZESTRIL) 5 MG tablet   Other Relevant Orders   Comprehensive metabolic panel   Hyperlipemia    Restart cholesterol meds      Relevant Medications   amLODipine (NORVASC) 10 MG tablet   atorvastatin (LIPITOR) 20 MG tablet   carvedilol (COREG) 6.25 MG tablet   furosemide (LASIX) 40 MG tablet   isosorbide mononitrate (IMDUR) 60 MG 24 hr tablet   lisinopril (PRINIVIL,ZESTRIL) 5 MG tablet   Other Relevant Orders   Lipid Panel w/o Chol/HDL Ratio   Stable angina [facility])    Appointment pending with cardiology      Relevant Medications   amLODipine (NORVASC) 10 MG tablet   atorvastatin (LIPITOR) 20 MG tablet   carvedilol (COREG) 6.25 MG tablet   furosemide (LASIX) 40 MG tablet   isosorbide mononitrate (IMDUR) 60 MG 24 hr tablet   lisinopril (PRINIVIL,ZESTRIL) 5 MG tablet    Other Visit Diagnoses    Wound abscess, initial encounter    -  Primary    Refer to wound clinic    Chronic kidney disease, stage 3        Relevant Medications    furosemide (LASIX) 40 MG tablet    lisinopril (PRINIVIL,ZESTRIL) 5 MG tablet        Follow up plan: Return in about 4 weeks (around 07/26/2015).

## 2015-06-28 NOTE — Assessment & Plan Note (Addendum)
Check CMP.  Improve BP control

## 2015-06-28 NOTE — Assessment & Plan Note (Signed)
Appointment pending with cardiology

## 2015-06-28 NOTE — Assessment & Plan Note (Signed)
Restart cholesterol meds

## 2015-06-29 ENCOUNTER — Encounter: Payer: Self-pay | Admitting: Unknown Physician Specialty

## 2015-06-29 LAB — COMPREHENSIVE METABOLIC PANEL
ALT: 25 IU/L (ref 0–44)
AST: 26 IU/L (ref 0–40)
Albumin/Globulin Ratio: 1.6 (ref 1.2–2.2)
Albumin: 4.1 g/dL (ref 3.5–4.8)
Alkaline Phosphatase: 81 IU/L (ref 39–117)
BUN/Creatinine Ratio: 16 (ref 10–24)
BUN: 23 mg/dL (ref 8–27)
Bilirubin Total: 0.7 mg/dL (ref 0.0–1.2)
CO2: 24 mmol/L (ref 18–29)
Calcium: 9.7 mg/dL (ref 8.6–10.2)
Chloride: 103 mmol/L (ref 96–106)
Creatinine, Ser: 1.45 mg/dL — ABNORMAL HIGH (ref 0.76–1.27)
GFR calc Af Amer: 53 mL/min/{1.73_m2} — ABNORMAL LOW (ref >59)
GFR calc non Af Amer: 46 mL/min/{1.73_m2} — ABNORMAL LOW (ref >59)
Globulin, Total: 2.6 g/dL (ref 1.5–4.5)
Glucose: 92 mg/dL (ref 65–99)
Potassium: 5 mmol/L (ref 3.5–5.2)
Sodium: 142 mmol/L (ref 134–144)
Total Protein: 6.7 g/dL (ref 6.0–8.5)

## 2015-06-29 LAB — CBC
Hematocrit: 43 % (ref 37.5–51.0)
Hemoglobin: 14.5 g/dL (ref 12.6–17.7)
MCH: 30.5 pg (ref 26.6–33.0)
MCHC: 33.7 g/dL (ref 31.5–35.7)
MCV: 90 fL (ref 79–97)
Platelets: 265 10*3/uL (ref 150–379)
RBC: 4.76 x10E6/uL (ref 4.14–5.80)
RDW: 13.5 % (ref 12.3–15.4)
WBC: 5.5 10*3/uL (ref 3.4–10.8)

## 2015-06-29 LAB — LIPID PANEL W/O CHOL/HDL RATIO
Cholesterol, Total: 109 mg/dL (ref 100–199)
HDL: 32 mg/dL — ABNORMAL LOW (ref >39)
LDL Calculated: 38 mg/dL (ref 0–99)
Triglycerides: 194 mg/dL — ABNORMAL HIGH (ref 0–149)
VLDL Cholesterol Cal: 39 mg/dL (ref 5–40)

## 2015-06-30 ENCOUNTER — Other Ambulatory Visit: Payer: Self-pay

## 2015-06-30 MED ORDER — AMLODIPINE BESYLATE 10 MG PO TABS: 10.0000 mg | ORAL_TABLET | Freq: Every day | ORAL | Status: DC

## 2015-06-30 MED ORDER — CARVEDILOL 6.25 MG PO TABS: 6.2500 mg | ORAL_TABLET | Freq: Two times a day (BID) | ORAL | Status: DC

## 2015-06-30 MED ORDER — ALLOPURINOL 100 MG PO TABS: 100.0000 mg | ORAL_TABLET | Freq: Every day | ORAL | Status: DC

## 2015-06-30 MED ORDER — NITROGLYCERIN 0.4 MG SL SUBL: 0.4000 mg | SUBLINGUAL_TABLET | SUBLINGUAL | Status: DC | PRN

## 2015-06-30 MED ORDER — OMEPRAZOLE 20 MG PO CPDR: 20.0000 mg | DELAYED_RELEASE_CAPSULE | Freq: Every day | ORAL | Status: DC

## 2015-06-30 MED ORDER — ATORVASTATIN CALCIUM 20 MG PO TABS: 20.0000 mg | ORAL_TABLET | Freq: Every day | ORAL | Status: DC

## 2015-06-30 NOTE — Telephone Encounter (Signed)
Pharmacy is [facility] [address].

## 2015-07-05 ENCOUNTER — Encounter: Payer: Medicaid Other | Attending: Surgery | Admitting: Surgery

## 2015-07-05 DIAGNOSIS — X58XXXA Exposure to other specified factors, initial encounter: Secondary | ICD-10-CM | POA: Insufficient documentation

## 2015-07-05 DIAGNOSIS — N183 Chronic kidney disease, stage 3 (moderate): Secondary | ICD-10-CM | POA: Diagnosis not present

## 2015-07-05 DIAGNOSIS — L97222 Non-pressure chronic ulcer of left calf with fat layer exposed: Secondary | ICD-10-CM | POA: Insufficient documentation

## 2015-07-05 DIAGNOSIS — S81812A Laceration without foreign body, left lower leg, initial encounter: Secondary | ICD-10-CM | POA: Insufficient documentation

## 2015-07-05 DIAGNOSIS — Z79899 Other long term (current) drug therapy: Secondary | ICD-10-CM | POA: Insufficient documentation

## 2015-07-05 DIAGNOSIS — E785 Hyperlipidemia, unspecified: Secondary | ICD-10-CM | POA: Diagnosis not present

## 2015-07-05 DIAGNOSIS — I129 Hypertensive chronic kidney disease with stage 1 through stage 4 chronic kidney disease, or unspecified chronic kidney disease: Secondary | ICD-10-CM | POA: Diagnosis not present

## 2015-07-05 NOTE — Progress Notes (Signed)
Gerald Hodges ([id number]) Visit Report for 07/05/2015 Chief Complaint Document Details Patient Name: Gerald Hodges, Gerald Hodges Date of Service: 07/05/2015 8:45 AM Medical Record Number: [id number] Patient Account Number: [id number] Date of Birth/Sex: [birth year] (80 y.o. Male) Treating RN: Leonard Downing Primary Care Physician: Gabriel Cirri Other Clinician: Referring Physician: Gabriel Cirri Treating Physician/Extender: Rudene Re in Treatment: 0 Information Obtained from: Patient Chief Complaint Patient presents to the wound care center for a consult due non healing wound to the left lower extremity which he sustained about 3 weeks ago. The patient speaks Spanish and is seen with an Print production planner) Signed: 07/05/2015 9:57:56 AM By: Evlyn Kanner MD, FACS Entered By: Evlyn Kanner on 07/05/2015 09:57:56 Gerald Hodges ([id number]) -------------------------------------------------------------------------------- Debridement Details Patient Name: Gerald Hodges Date of Service: 07/05/2015 8:45 AM Medical Record Number: [id number] Patient Account Number: [id number] Date of Birth/Sex: 1935-12-15 (80 y.o. Male) Treating RN: Leonard Downing Primary Care Physician: Gabriel Cirri Other Clinician: Referring Physician: Gabriel Cirri Treating Physician/Extender: Rudene Re in Treatment: 0 Debridement Performed for Wound #1 Left Lower Leg Assessment: Performed By: Physician Evlyn Kanner, MD Debridement: Debridement Pre-procedure Yes Verification/Time Out Taken: Start Time: 09:33 Pain Control: Lidocaine 4% Topical Solution Level: Skin/Subcutaneous Tissue Total Area Debrided (L x 1.8 (cm) x 0.9 (cm) = 1.62 (cm) W): Tissue and other Viable, Non-Viable, Fibrin/Slough, Subcutaneous material debrided: Instrument: Curette Bleeding: Minimum Hemostasis Achieved: Pressure End Time: 09:35 Procedural Pain: 0 Post Procedural Pain: 1 Response to  Treatment: Procedure was tolerated well Post Debridement Measurements of Total Wound Length: (cm) 1.8 Width: (cm) 0.9 Depth: (cm) 0.2 Volume: (cm) 0.254 Post Procedure Diagnosis Same as Pre-procedure Electronic Signature(s) Signed: 07/05/2015 9:57:14 AM By: Evlyn Kanner MD, FACS Signed: 07/05/2015 3:31:28 PM By: Lucrezia Starch RN, Sendra Entered By: Evlyn Kanner on 07/05/2015 09:57:13 Gerald Hodges ([id number]) -------------------------------------------------------------------------------- HPI Details Patient Name: Gerald Hodges Date of Service: 07/05/2015 8:45 AM Medical Record Number: [id number] Patient Account Number: [id number] Date of Birth/Sex: October 19, 1935 (80 y.o. Male) Treating RN: Leonard Downing Primary Care Physician: Gabriel Cirri Other Clinician: Referring Physician: Gabriel Cirri Treating Physician/Extender: Rudene Re in Treatment: 0 History of Present Illness Location: left lower extremity Quality: Patient reports experiencing a dull pain to affected area(s). Severity: Patient states wound are getting worse. Duration: Patient has had the wound for < 3 weeks prior to presenting for treatment Timing: Pain in wound is Intermittent (comes and goes Context: The wound occurred when the patient injured himself with a hammer which caused a wound on the left lower extremity Modifying Factors: Other treatment(s) tried include:hydrogen peroxide and some home remedies Associated Signs and Symptoms: Patient reports having increase swelling. HPI Description: 80 year old Hispanic gentleman who is seen with the interpreter for a injury to the left lower extremity sustained about 3 weeks ago with a gamma which tore his skin open. He has been using some home remedies and hydrogen peroxide to clean this wound and it has gotten rather dry with no signs of infection. Has a past medical history of hypertension and does not have any other issues. the patient has a past  medical history of hyperlipidemia and hypertension but has not been taking medications for a long while. he is also known to have chronic kidney disease stage III and had stable angina. Electronic Signature(s) Signed: 07/05/2015 10:01:48 AM By: Evlyn Kanner MD, FACS Entered By: Evlyn Kanner on 07/05/2015 10:01:47 Gerald Hodges ([id number]) -------------------------------------------------------------------------------- Physical Exam Details Patient Name: Gerald Hodges Date of Service: 07/05/2015 8:45 AM Medical Record Number: [id number] Patient Account Number: [id number]  Date of Birth/Sex: 31-Aug-1935 (80 y.o. Male) Treating RN: Leonard Downing Primary Care Physician: Gabriel Cirri Other Clinician: Referring Physician: Gabriel Cirri Treating Physician/Extender: Rudene Re in Treatment: 0 Constitutional . Pulse regular. Respirations normal and unlabored. Afebrile. . Eyes Nonicteric. Reactive to light. Ears, Nose, Mouth, and Throat Lips, teeth, and gums WNL.Marland Kitchen Moist mucosa without lesions. Neck supple and nontender. No palpable supraclavicular or cervical adenopathy. Normal sized without goiter. Respiratory WNL. No retractions.. Cardiovascular Pedal Pulses WNL. ABI on the left was 0.94. No clubbing, cyanosis or edema. Lymphatic No adneopathy. No adenopathy. No adenopathy. Musculoskeletal Adexa without tenderness or enlargement.. Digits and nails w/o clubbing, cyanosis, infection, petechiae, ischemia, or inflammatory conditions.. Integumentary (Hair, Skin) No suspicious lesions. No crepitus or fluctuance. No peri-wound warmth or erythema. No masses.Marland Kitchen Psychiatric Judgement and insight Intact.. No evidence of depression, anxiety, or agitation.. Notes he has an open wound down to the subcutaneous tissue which is covered with dry eschar with some necrotic skin and sharp debridement was done with a #3 curet and bleeding was brisk and controlled  with pressure Electronic Signature(s) Signed: 07/05/2015 10:02:38 AM By: Evlyn Kanner MD, FACS Entered By: Evlyn Kanner on 07/05/2015 10:02:36 Gerald Hodges ([id number]) -------------------------------------------------------------------------------- Physician Orders Details Patient Name: Gerald Hodges Date of Service: 07/05/2015 8:45 AM Medical Record Number: [id number] Patient Account Number: [id number] Date of Birth/Sex: [birth year] (80 y.o. Male) Treating RN: Leonard Downing Primary Care Physician: Gabriel Cirri Other Clinician: Referring Physician: Gabriel Cirri Treating Physician/Extender: Rudene Re in Treatment: 0 Verbal / Phone Orders: Yes Clinician: Leonard Downing Read Back and Verified: Yes Diagnosis Coding Wound Cleansing Wound #1 Left Lower Leg o Cleanse wound with mild soap and water Primary Wound Dressing Wound #1 Left Lower Leg o Santyl Ointment - this is the medication of choice but it is expensive, will order through [facility] direct. If unable to purchase this medication then use Medihoney o Medihoney gel - use this medication only if the other medication (Santyl) is not available Secondary Dressing Wound #1 Left Lower Leg o Boardered Foam Dressing Dressing Change Frequency Wound #1 Left Lower Leg o Change dressing every day. Follow-up Appointments Wound #1 Left Lower Leg o Return Appointment in 1 week. Patient Medications Allergies: no known drug allergies Notifications Medication Indication Start End MediHoney (honey) 07/05/2015 DOSE topical 100 % paste - paste topical as directed Electronic Signature(s) Signed: 07/05/2015 10:06:31 AM By: Evlyn Kanner MD, FACS Entered By: Evlyn Kanner on 07/05/2015 10:06:30 KARLOZ, GOGERTY ([id number]) [place], Margurite Auerbach ([id number]) -------------------------------------------------------------------------------- Prescription 07/05/2015 Patient Name: Gerald Hodges Physician: Evlyn Kanner MD Date of Birth: 02/06/1936 NPI#: [id number] Sex: Judie Petit DEA#: [id number] Phone #: [phone] License #: Patient Address: [facility] [address] [place], [place] [address] [facility] [address], [address] [place], [place] [address] [phone] Allergies no known drug allergies Physician's Orders Santyl Ointment - this is the medication of choice but it is expensive, will order through [facility] direct. If unable to purchase this medication then use Medihoney Signature(s): Date(s): Electronic Signature(s) Signed: 07/05/2015 3:39:07 PM By: Evlyn Kanner MD, FACS Entered By: Evlyn Kanner on 07/05/2015 10:06:32 Gerald Hodges ([id number]) --------------------------------------------------------------------------------  Problem List Details Patient Name: Gerald Hodges Date of Service: 07/05/2015 8:45 AM Medical Record Number: [id number] Patient Account Number: [id number] Date of Birth/Sex: February 17, 1936 (80 y.o. 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 Male) Treating RN: Leonard Downing Primary Care Physician: Gabriel Cirri Other Clinician: Referring Physician: Gabriel Cirri Treating Physician/Extender: Rudene Re in Treatment: 0 Subjective Chief Complaint Information obtained from Patient Patient presents to the wound care center for a consult due non healing wound to the left lower extremity which he sustained about 3 weeks ago. The patient speaks Spanish and is seen with an interpreter History of Present Illness (HPI) The following HPI elements were documented for the patient's wound: Location: left lower extremity Quality: Patient reports experiencing a dull pain to affected area(s). Severity: Patient states wound are getting worse. Duration: Patient has had the wound for < 3 weeks prior to presenting for treatment Timing: Pain in wound is Intermittent (comes and goes Context: The wound occurred when the patient injured himself with a hammer which caused a wound on the left lower extremity Modifying Factors: Other treatment(s) tried include:hydrogen peroxide and some home remedies Associated Signs and Symptoms: Patient reports having increase swelling. 80 year old Hispanic gentleman who is seen with the interpreter for a injury to the left lower extremity sustained about 3 weeks ago with a gamma which tore his skin open. He has been using some home remedies and hydrogen peroxide to clean this wound and it has gotten rather dry with no signs of infection. Has a past medical history of hypertension and does not have any other issues. the patient has a past medical history of hyperlipidemia and hypertension but has not been taking medications for a long while. he is also known to have chronic kidney disease stage III and had stable angina. Wound History Patient presents with 1 open wound that has been present for approximately 3 weeks. Patient has been treating wound in the following manner: peroxide. Laboratory  tests have not been performed in the last month. Patient reportedly has not tested positive for an antibiotic resistant organism. Patient reportedly has not had testing performed to evaluate circulation in the legs. Patient History Information obtained from Patient, son. Allergies no known drug allergies JAMIE, BELGER ([id number]) Social History Never smoker, Marital Status - Married, Alcohol Use - Never, Drug Use - No History, Caffeine Use - Daily. Medical History Cardiovascular Patient has history of Angina - stable, Hypertension Medical And Surgical History Notes Cardiovascular hyperlipemia Review of Systems (ROS) Constitutional Symptoms (General Health) Complains or has symptoms of Fever - "feels like I have been running a temperature". Denies complaints or symptoms of Fatigue, Chills, Marked Weight Change. Eyes The patient has no complaints or symptoms. Ear/Nose/Mouth/Throat The patient has no complaints or symptoms. Hematologic/Lymphatic The patient has no complaints or symptoms. Medications Coreg 6.25 mg tablet oral tablet oral Lipitor 20 mg tablet oral tablet oral lisinopril 5 mg tablet oral tablet oral amlodipine 10 mg tablet oral tablet oral Lasix 40 mg tablet oral tablet oral isosorbide mononitrate ER 60 mg tablet,extended release 24 hr oral tablet extended release 24 hr oral Objective Constitutional Pulse regular. Respirations normal and unlabored. Afebrile. Vitals Time Taken: 9:15 AM, Height: 64 in, Source: Stated, Weight: 190 lbs, Source: Stated,  BMI: 32.6, Temperature: 98.0 F, Pulse: 67 bpm, Blood Pressure: 159/80 mmHg. Eyes Nonicteric. Reactive to light. [place], Margurite Auerbach ([id number]) Ears, Nose, Mouth, and Throat Lips, teeth, and gums WNL.Marland Kitchen Moist mucosa without lesions. Neck supple and nontender. No palpable supraclavicular or cervical adenopathy. Normal sized without goiter. Respiratory WNL. No retractions.. Cardiovascular Pedal Pulses WNL. ABI on  the left was 0.94. No clubbing, cyanosis or edema. Lymphatic No adneopathy. No adenopathy. No adenopathy. Musculoskeletal Adexa without tenderness or enlargement.. Digits and nails w/o clubbing, cyanosis, infection, petechiae, ischemia, or inflammatory conditions.Marland Kitchen Psychiatric Judgement and insight Intact.. No evidence of depression, anxiety, or agitation.. General Notes: he has an open wound down to the subcutaneous tissue which is covered with dry eschar with some necrotic skin and sharp debridement was done with a #3 curet and bleeding was brisk and controlled with pressure Integumentary (Hair, Skin) No suspicious lesions. No crepitus or fluctuance. No peri-wound warmth or erythema. No masses.. Wound #1 status is Open. Original cause of wound was Trauma. The wound is located on the Left Lower Leg. The wound measures 1.8cm length x 0.9cm width x 0.1cm depth; 1.272cm^2 area and 0.127cm^3 volume. The wound is limited to skin breakdown. There is no tunneling or undermining noted. There is a none present amount of drainage noted. The wound margin is flat and intact. There is small (1-33%) granulation within the wound bed. There is a large (67-100%) amount of necrotic tissue within the wound bed including Eschar and Adherent Slough. The periwound skin appearance exhibited: Dry/Scaly. The periwound skin appearance did not exhibit: Callus, Crepitus, Excoriation, Fluctuance, Friable, Induration, Localized Edema, Rash, Scarring, Maceration, Moist, Atrophie Blanche, Cyanosis, Ecchymosis, Hemosiderin Staining, Mottled, Pallor, Rubor, Erythema. Periwound temperature was noted as No Abnormality. Assessment Active Problems ICD-10 JADEN, ABREU ([id number]) [phone] - Non-pressure chronic ulcer of left calf with fat layer exposed S81.812A - Laceration without foreign body, left lower leg, initial encounter this 80 year old gentleman who has been using hydrogen peroxide and some home remedies on his  wound of the left lower extremity on the left leg, has been advised to stop all previous treatments. I have recommended: 1. Santyl ointment locally to be applied daily and daily obtains this from his insurance company we will ask him to use some medical honey. 2. He can wash with soap and water and do her daily dressing and see me back next week Procedures Wound #1 Wound #1 is a Trauma, Other located on the Left Lower Leg . There was a Skin/Subcutaneous Tissue Debridement ([id number]) debridement with total area of 1.62 sq cm performed by Evlyn Kanner, MD. with the following instrument(s): Curette to remove Viable and Non-Viable tissue/material including Fibrin/Slough and Subcutaneous after achieving pain control using Lidocaine 4% Topical Solution. A time out was conducted prior to the start of the procedure. A Minimum amount of bleeding was controlled with Pressure. The procedure was tolerated well with a pain level of 0 throughout and a pain level of 1 following the procedure. Post Debridement Measurements: 1.8cm length x 0.9cm width x 0.2cm depth; 0.254cm^3 volume. Post procedure Diagnosis Wound #1: Same as Pre-Procedure Plan Wound Cleansing: Wound #1 Left Lower Leg: Cleanse wound with mild soap and water Primary Wound Dressing: Wound #1 Left Lower Leg: Santyl Ointment - this is the medication of choice but it is expensive, will order through Santyl direct. If unable to purchase this medication then use Medihoney Medihoney gel - use this medication only if the other medication (Santyl) is not available Secondary Dressing: Wound #1 Left  Lower Leg: Boardered Foam Dressing Dressing Change Frequency: Wound #1 Left Lower Leg: DECLAN, MIER ([id number]) Change dressing every day. Follow-up Appointments: Wound #1 Left Lower Leg: Return Appointment in 1 week. The following medication(s) was prescribed: MediHoney (honey) topical 100 % paste paste topical as directed starting  07/05/2015 this 80 year old gentleman who has been using hydrogen peroxide and some home remedies on his wound of the left lower extremity on the left leg, has been advised to stop all previous treatments. I have recommended: 1. Santyl ointment locally to be applied daily and daily obtains this from his insurance company we will ask him to use some medical honey. 2. He can wash with soap and water and do her daily dressing and see me back next week Electronic Signature(s) Signed: 07/05/2015 10:07:06 AM By: Evlyn Kanner MD, FACS Previous Signature: 07/05/2015 10:06:56 AM Version By: Evlyn Kanner MD, FACS Previous Signature: 07/05/2015 10:04:09 AM Version By: Evlyn Kanner MD, FACS Entered By: Evlyn Kanner on 07/05/2015 10:07:06 Gerald Hodges ([id number]) -------------------------------------------------------------------------------- ROS/PFSH Details Patient Name: Gerald Hodges Date of Service: 07/05/2015 8:45 AM Medical Record Number: [id number] Patient Account Number: [id number] Date of Birth/Sex: [birth year] (80 y.o. Male) Treating RN: Leonard Downing Primary Care Physician: Gabriel Cirri Other Clinician: Referring Physician: Gabriel Cirri Treating Physician/Extender: Rudene Re in Treatment: 0 Information Obtained From Patient Other: son Wound History Do you currently have one or more open woundso Yes How many open wounds do you currently haveo 1 Approximately how long have you had your woundso 3 weeks How have you been treating your wound(s) until nowo peroxide Has your wound(s) ever healed and then re-openedo No Have you had any lab work done in the past montho No Have you tested positive for an antibiotic resistant organism (MRSA, VRE)o No Have you had any tests for circulation on your legso No Constitutional Symptoms (General Health) Complaints and Symptoms: Positive for: Fever - "feels like I have been running a temperature" Negative for: Fatigue; Chills;  Marked Weight Change Eyes Complaints and Symptoms: No Complaints or Symptoms Ear/Nose/Mouth/Throat Complaints and Symptoms: No Complaints or Symptoms Hematologic/Lymphatic Complaints and Symptoms: No Complaints or Symptoms Cardiovascular Medical History: Positive for: Angina - stable; Hypertension Past Medical History Notes: hyperlipemia Family and Social History SOTERO, BRINKMEYER ([id number]) Never smoker; Marital Status - Married; Alcohol Use: Never; Drug Use: No History; Caffeine Use: Daily; Financial Concerns: No; Food, Clothing or Shelter Needs: No; Support System Lacking: No; Transportation Concerns: No; Advanced Directives: No; Patient does not want information on Advanced Directives; Do not resuscitate: No; Living Will: No; Medical Power of Attorney: No Physician Affirmation I have reviewed and agree with the above information. Electronic Signature(s) Signed: 07/05/2015 9:57:25 AM By: Evlyn Kanner MD, FACS Signed: 07/05/2015 3:31:28 PM By: Lucrezia Starch RN, Lennice Sites By: Evlyn Kanner on 07/05/2015 09:57:25 Gerald Hodges ([id number]) -------------------------------------------------------------------------------- SuperBill Details Patient Name: Gerald Hodges Date of Service: 07/05/2015 Medical Record Number: [id number] Patient Account Number: [id number] Date of Birth/Sex: 1935-11-26 (79 y.o. Male) Treating RN: Leonard Downing Primary Care Physician: Gabriel Cirri Other Clinician: Referring Physician: Gabriel Cirri Treating Physician/Extender: Rudene Re in Treatment: 0 Diagnosis Coding ICD-10 Codes Code Description [phone] Non-pressure chronic ulcer of left calf with fat layer exposed S81.812A Laceration without foreign body, left lower leg, initial encounter Facility Procedures CPT4 Code Description: [id number] 99213 - WOUND CARE VISIT-LEV 3 EST PT Modifier: Quantity: 1 CPT4 Code Description: [id number] 11042 - DEB SUBQ TISSUE 20 SQ CM/< ICD-10  Description Diagnosis L97.222 Non-pressure chronic ulcer of left calf with fat  lay [phone] Laceration without foreign body, left lower leg, ini Modifier: er exposed tial encounter Quantity: 1 Physician Procedures CPT4 Code Description: [id number] 99204 - WC PHYS LEVEL 4 - NEW PT ICD-10 Description Diagnosis L97.222 Non-pressure chronic ulcer of left calf with fat lay S81.812A Laceration without foreign body, left lower leg, ini Modifier: er exposed tial encounter Quantity: 1 CPT4 Code Description: [id number] 11042 - WC PHYS SUBQ TISS 20 SQ CM ICD-10 Description Diagnosis L97.222 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

## 2015-07-05 NOTE — Progress Notes (Signed)
OHM, STARLIPER ([id number]) Visit Report for 07/05/2015 Abuse/Suicide Risk Screen Details Patient Name: Gerald Hodges, Gerald Hodges Date of Service: 07/05/2015 8:45 AM Medical Record Number: [id number] Patient Account Number: [id number] Date of Birth/Sex: 12-20-1935 (80 y.o. Male) Treating RN: Leonard Downing Primary Care Physician: Gabriel Cirri Other Clinician: Referring Physician: Gabriel Cirri Treating Physician/Extender: Rudene Re in Treatment: 0 Abuse/Suicide Risk Screen Items Answer ABUSE/SUICIDE RISK SCREEN: Has anyone close to you tried to hurt or harm you recentlyo No Do you feel uncomfortable with anyone in your familyo No Has anyone forced you do things that you didnot want to doo No Do you have any thoughts of harming yourselfo No Patient displays signs or symptoms of abuse and/or neglect. No Electronic Signature(s) Signed: 07/05/2015 3:31:28 PM By: Lucrezia Starch, RN, Sendra Entered By: Lucrezia Starch RN, Sendra on 07/05/2015 09:14:18 Gerald Hodges ([id number]) -------------------------------------------------------------------------------- Activities of Daily Living Details Patient Name: Gerald Hodges Date of Service: 07/05/2015 8:45 AM Medical Record Number: [id number] Patient Account Number: [id number] Date of Birth/Sex: 08-10-1935 (80 y.o. Male) Treating RN: Leonard Downing Primary Care Physician: Gabriel Cirri Other Clinician: Referring Physician: Gabriel Cirri Treating Physician/Extender: Rudene Re in Treatment: 0 Activities of Daily Living Items Answer Activities of Daily Living (Please select one for each item) Drive Automobile Not Able Take Medications Completely Able Use Telephone Completely Able Care for Appearance Completely Able Use Toilet Completely Able Bath / Shower Completely Able Dress Self Completely Able Feed Self Completely Able Walk Completely Able Get In / Out Bed Completely Able Housework Completely Able Prepare Meals  Completely Able Handle Money Completely Able Shop for Self Need Assistance Electronic Signature(s) Signed: 07/05/2015 3:31:28 PM By: Lucrezia Starch, RN, Sendra Entered By: Lucrezia Starch RN, Sendra on 07/05/2015 09:14:54 Gerald Hodges ([id number]) -------------------------------------------------------------------------------- Education Assessment Details Patient Name: Gerald Hodges Date of Service: 07/05/2015 8:45 AM Medical Record Number: [id number] Patient Account Number: [id number] Date of Birth/Sex: 21-Jan-1936 (80 y.o. Male) Treating RN: Leonard Downing Primary Care Physician: Gabriel Cirri Other Clinician: Referring Physician: Gabriel Cirri Treating Physician/Extender: Rudene Re in Treatment: 0 Primary Learner Assessed: Patient Learning Preferences/Education Level/Primary Language Learning Preference: Explanation, Demonstration Highest Education Level: Grade School Preferred Language: Spanish Cognitive Barrier Assessment/Beliefs Language Barrier: Corporate investment banker Needed: Yes Hospital Employed Language Interpreter Memory Deficit: No Emotional Barrier: No Physical Barrier Assessment Impaired Vision: Yes Glasses Impaired Hearing: Yes does not hear well Decreased Hand dexterity: No Knowledge/Comprehension Assessment Knowledge Level: Medium Comprehension Level: Medium Ability to understand written Medium instructions: Ability to understand verbal Medium instructions: Motivation Assessment Anxiety Level: Calm Cooperation: Cooperative Education Importance: Acknowledges Need Interest in Health Problems: Asks Questions Perception: Coherent Willingness to Engage in Self- High Management Activities: Readiness to Engage in Self- High Management Activities: Electronic Signature(s) Signed: 07/05/2015 3:31:28 PM By: Lucrezia Starch RN, Basilio Cairo, Selassie ([id number]) Entered By: Lucrezia Starch RN, Sendra on 07/05/2015 09:16:15 Gerald Hodges  ([id number]) -------------------------------------------------------------------------------- Fall Risk Assessment Details Patient Name: Gerald Hodges Date of Service: 07/05/2015 8:45 AM Medical Record Number: [id number] Patient Account Number: [id number] Date of Birth/Sex: March 27, 1935 (80 y.o. Male) Treating RN: Leonard Downing Primary Care Physician: Gabriel Cirri Other Clinician: Referring Physician: Gabriel Cirri Treating Physician/Extender: Rudene Re in Treatment: 0 Fall Risk Assessment Items Have you had 2 or more falls in the last 12 monthso 0 No Have you had any fall that resulted in injury in the last 12 monthso 0 No FALL RISK ASSESSMENT: History of falling - immediate or within 3 months 0 No Secondary diagnosis 15 Yes Ambulatory aid None/bed rest/wheelchair/nurse 0 Yes Crutches/cane/walker 0 No Furniture 0  No IV Access/Saline Lock 0 No Gait/Training Normal/bed rest/immobile 0 Yes Weak 0 No Impaired 0 No Mental Status Oriented to own ability 0 Yes Electronic Signature(s) Signed: 07/05/2015 3:31:28 PM By: Lucrezia Starch, RN, Sendra Entered By: Lucrezia Starch RN, Sendra on 07/05/2015 09:16:33 Gerald Hodges ([id number]) -------------------------------------------------------------------------------- Foot Assessment Details Patient Name: Gerald Hodges Date of Service: 07/05/2015 8:45 AM Medical Record Number: [id number] Patient Account Number: [id number] Date of Birth/Sex: Jun 18, 1935 (80 y.o. Male) Treating RN: Leonard Downing Primary Care Physician: Gabriel Cirri Other Clinician: Referring Physician: Gabriel Cirri Treating Physician/Extender: Rudene Re in Treatment: 0 Foot Assessment Items Site Locations + = Sensation present, - = Sensation absent, C = Callus, U = Ulcer R = Redness, W = Warmth, M = Maceration, PU = Pre-ulcerative lesion F = Fissure, S = Swelling, D = Dryness Assessment Right: Left: Other Deformity: No No Prior Foot Ulcer: No  No Prior Amputation: No No Charcot Joint: No No Ambulatory Status: Ambulatory Without Help Gait: Steady Electronic Signature(s) Signed: 07/05/2015 3:31:28 PM By: Lucrezia Starch, RN, Sendra Entered By: Lucrezia Starch RN, Sendra on 07/05/2015 09:17:12 Gerald Hodges ([id number]) -------------------------------------------------------------------------------- Nutrition Risk Assessment Details Patient Name: Gerald Hodges Date of Service: 07/05/2015 8:45 AM Medical Record Number: [id number] Patient Account Number: [id number] Date of Birth/Sex: 07-07-1935 (80 y.o. Male) Treating RN: Leonard Downing Primary Care Physician: Gabriel Cirri Other Clinician: Referring Physician: Gabriel Cirri Treating Physician/Extender: Rudene Re in Treatment: 0 Height (in): Weight (lbs): Body Mass Index (BMI): Nutrition Risk Assessment Items NUTRITION RISK SCREEN: I have an illness or condition that made me change the kind and/or 0 No amount of food I eat I eat fewer than two meals per day 0 No I eat few fruits and vegetables, or milk products 0 No I have three or more drinks of beer, liquor or wine almost every day 0 No I have tooth or mouth problems that make it hard for me to eat 0 No I don't always have enough money to buy the food I need 0 No I eat alone most of the time 0 No I take three or more different prescribed or over-the-counter drugs a 1 Yes day Without wanting to, I have lost or gained 10 pounds in the last six 0 No months I am not always physically able to shop, cook and/or feed myself 0 No Nutrition Protocols Good Risk Protocol 0 No interventions needed Moderate Risk Protocol Electronic Signature(s) Signed: 07/05/2015 3:31:28 PM By: Lucrezia Starch RN, Sendra Entered By: Lucrezia Starch RN, Sendra on 07/05/2015 09:16:43

## 2015-07-05 NOTE — Progress Notes (Signed)
HARTFORD, MAULDEN ([id number]) Visit Report for 07/05/2015 Allergy List Details Patient Name: CHAN, SHEAHAN Date of Service: 07/05/2015 8:45 AM Medical Record Number: [id number] Patient Account Number: [id number] Date of Birth/Sex: 22-Jun-1935 (80 y.o. Male) Treating RN: Leonard Downing Primary Care Physician: Gabriel Cirri Other Clinician: Referring Physician: Gabriel Cirri Treating Physician/Extender: Rudene Re in Treatment: 0 Allergies Active Allergies no known drug allergies Allergy Notes Electronic Signature(s) Signed: 07/05/2015 3:31:28 PM By: Lucrezia Starch, RN, Sendra Entered By: Lucrezia Starch RN, Sendra on 07/05/2015 09:09:08 [name] ([id number]) -------------------------------------------------------------------------------- Arrival Information Details Patient Name: [name] Date of Service: 07/05/2015 8:45 AM Medical Record Number: [id number] Patient Account Number: [id number] Date of Birth/Sex: [birth year] (80 y.o. Male) Treating RN: Leonard Downing Primary Care Physician: Gabriel Cirri Other Clinician: Referring Physician: Gabriel Cirri Treating Physician/Extender: Rudene Re in Treatment: 0 Visit Information Patient Arrived: Ambulatory Arrival Time: 08:55 Accompanied By: son and spanish interpreter Transfer Assistance: None Patient Identification Verified: Yes Secondary Verification Process Yes Completed: Patient Requires Transmission- No Based Precautions: Patient Has Alerts: Yes Patient Alerts: 07/05/15 ABI (L) 0.94 Electronic Signature(s) Signed: 07/05/2015 3:31:28 PM By: Lucrezia Starch, RN, Sendra Entered By: Lucrezia Starch RN, Sendra on 07/05/2015 09:22:38 [name] ([id number]) -------------------------------------------------------------------------------- Clinic Level of Care Assessment Details Patient Name: [name] Date of Service: 07/05/2015 8:45 AM Medical Record Number: [id number] Patient Account Number:  [id number] Date of Birth/Sex: 30-Jan-1936 (80 y.o. Male) Treating RN: Leonard Downing Primary Care Physician: Gabriel Cirri Other Clinician: Referring Physician: Gabriel Cirri Treating Physician/Extender: Rudene Re in Treatment: 0 Clinic Level of Care Assessment Items TOOL 1 Quantity Score X - Use when EandM and Procedure is performed on INITIAL visit 1 0 ASSESSMENTS - Nursing Assessment / Reassessment X - General Physical Exam (combine w/ comprehensive assessment (listed just 1 20 below) when performed on new pt. evals) X - Comprehensive Assessment (HX, ROS, Risk Assessments, Wounds Hx, etc.) 1 25 ASSESSMENTS - Wound and Skin Assessment / Reassessment []  - Dermatologic / Skin Assessment (not related to wound area) 0 ASSESSMENTS - Ostomy and/or Continence Assessment and Care []  - Incontinence Assessment and Management 0 []  - Ostomy Care Assessment and Management (repouching, etc.) 0 PROCESS - Coordination of Care X - Simple Patient / Family Education for ongoing care 1 15 []  - Complex (extensive) Patient / Family Education for ongoing care 0 X - Staff obtains Chiropractor, Records, Test Results / Process Orders 1 10 []  - Staff telephones HHA, Nursing Homes / Clarify orders / etc 0 []  - Routine Transfer to another Facility (non-emergent condition) 0 []  - Routine Hospital Admission (non-emergent condition) 0 []  - New Admissions / Manufacturing engineer / Ordering NPWT, Apligraf, etc. 0 []  - Emergency Hospital Admission (emergent condition) 0 PROCESS - Special Needs []  - Pediatric / Minor Patient Management 0 []  - Isolation Patient Management 0 TORRIAN, CANION ([id number]) []  - Hearing / Language / Visual special needs 0 []  - Assessment of Community assistance (transportation, D/C planning, etc.) 0 []  - Additional assistance / Altered mentation 0 []  - Support Surface(s) Assessment (bed, cushion, seat, etc.) 0 INTERVENTIONS - Miscellaneous []  - External ear exam 0 []  -  Patient Transfer (multiple staff / Nurse, adult / Similar devices) 0 []  - Simple Staple / Suture removal (25 or less) 0 []  - Complex Staple / Suture removal (26 or more) 0 []  - Hypo/Hyperglycemic Management (do not check if billed separately) 0 X - Ankle / Brachial Index (ABI) - do not check if billed separately 1 15 Has the patient been seen at the  hospital within the last three years: Yes Total Score: 85 Level Of Care: New/Established - Level 3 Electronic Signature(s) Signed: 07/05/2015 3:31:28 PM By: Lucrezia Starch, RN, Sendra Entered By: Lucrezia Starch RN, Sendra on 07/05/2015 10:02:26 [name] ([id number]) -------------------------------------------------------------------------------- Encounter Discharge Information Details Patient Name: [name] Date of Service: 07/05/2015 8:45 AM Medical Record Number: [id number] Patient Account Number: [id number] Date of Birth/Sex: 07/02/1935 (80 y.o. Male) Treating RN: Leonard Downing Primary Care Physician: Gabriel Cirri Other Clinician: Referring Physician: Gabriel Cirri Treating Physician/Extender: Rudene Re in Treatment: 0 Encounter Discharge Information Items Schedule Follow-up Appointment: No Medication Reconciliation completed and provided to Patient/Care No Tynia Wiers: Provided on Clinical Summary of Care: 07/05/2015 Form Type Recipient Paper Patient AS Electronic Signature(s) Signed: 07/05/2015 9:47:30 AM By: Gwenlyn Perking Entered By: Gwenlyn Perking on 07/05/2015 09:47:30 [name] ([id number]) -------------------------------------------------------------------------------- Lower Extremity Assessment Details Patient Name: [name] Date of Service: 07/05/2015 8:45 AM Medical Record Number: [id number] Patient Account Number: [id number] Date of Birth/Sex: [birth year] (80 y.o. Male) Treating RN: Leonard Downing Primary Care Physician: Gabriel Cirri Other Clinician: Referring Physician: Gabriel Cirri Treating Physician/Extender: Rudene Re in Treatment: 0 Edema Assessment Assessed: [Left: No] [Right: No] E[Left: dema] [Right: :] Calf Left: Right: Point of Measurement: 30 cm From Medial Instep 39 cm cm Ankle Left: Right: Point of Measurement: 10 cm From Medial Instep 24 cm cm Vascular Assessment Pulses: Posterior Tibial Dorsalis Pedis Palpable: [Left:Yes] Extremity colors, hair growth, and conditions: Extremity Color: [Left:Normal] Hair Growth on Extremity: [Left:Yes] Temperature of Extremity: [Left:Warm] Capillary Refill: [Left:< 3 seconds] Dependent Rubor: [Left:No] Blanched when Elevated: [Left:No] Lipodermatosclerosis: [Left:No] Blood Pressure: Brachial: [Left:159] Dorsalis Pedis: 150 [Left:Dorsalis Pedis:] Ankle: Posterior Tibial: [Left:Posterior Tibial: 0.94] Toe Nail Assessment Left: Right: Thick: No Discolored: No Deformed: No Improper Length and Hygiene: No TABOR, DENHAM ([id number]) Electronic Signature(s) Signed: 07/05/2015 3:31:28 PM By: Lucrezia Starch, RN, Sendra Entered By: Lucrezia Starch RN, Sendra on 07/05/2015 09:04:23 [name] ([id number]) -------------------------------------------------------------------------------- Multi-Disciplinary Care Plan Details Patient Name: [name] Date of Service: 07/05/2015 8:45 AM Medical Record Number: [id number] Patient Account Number: [id number] Date of Birth/Sex: 12-16-1935 (80 y.o. Male) Treating RN: Leonard Downing Primary Care Physician: Gabriel Cirri Other Clinician: Referring Physician: Gabriel Cirri Treating Physician/Extender: Rudene Re in Treatment: 0 Active Inactive Orientation to the Wound Care Program Nursing Diagnoses: Knowledge deficit related to the wound healing center program Goals: Patient/caregiver will verbalize understanding of the Wound Healing Center Program Date Initiated: 07/05/2015 Goal Status: Active Interventions: Provide education on  orientation to the wound center Notes: Wound/Skin Impairment Nursing Diagnoses: Knowledge deficit related to ulceration/compromised skin integrity Goals: Patient/caregiver will verbalize understanding of skin care regimen Date Initiated: 07/05/2015 Goal Status: Active Ulcer/skin breakdown will have a volume reduction of 30% by week 4 Date Initiated: 07/05/2015 Goal Status: Active Ulcer/skin breakdown will have a volume reduction of 50% by week 8 Date Initiated: 07/05/2015 Goal Status: Active Ulcer/skin breakdown will have a volume reduction of 80% by week 12 Date Initiated: 07/05/2015 Goal Status: Active Ulcer/skin breakdown will heal within 14 weeks Date Initiated: 07/05/2015 Goal Status: Active YAKUB, LODES ([id number]) Interventions: Assess patient/caregiver ability to obtain necessary supplies Assess patient/caregiver ability to perform ulcer/skin care regimen upon admission and as needed Assess ulceration(s) every visit Provide education on ulcer and skin care Notes: Electronic Signature(s) Signed: 07/05/2015 3:31:28 PM By: Lucrezia Starch, RN, Sendra Entered By: Lucrezia Starch RN, Sendra on 07/05/2015 09:31:42 [name] ([id number]) -------------------------------------------------------------------------------- Pain Assessment Details Patient Name: [name] Date of Service: 07/05/2015 8:45 AM Medical Record Number: [id number] Patient Account Number:  [id number] Date of Birth/Sex: 1935-07-17 (80 y.o. Male) Treating RN: Leonard Downing Primary Care Physician: Gabriel Cirri Other Clinician: Referring Physician: Gabriel Cirri Treating Physician/Extender: Rudene Re in Treatment: 0 Active Problems Location of Pain Severity and Description of Pain Patient Has Paino No Site Locations Rate the pain. Current Pain Level: 0 Pain Management and Medication Current Pain Management: Electronic Signature(s) Signed: 07/05/2015 3:31:28 PM By: Lucrezia Starch, RN,  Sendra Entered By: Lucrezia Starch, RN, Sendra on 07/05/2015 09:19:28 [name] ([id number]) -------------------------------------------------------------------------------- Patient/Caregiver Education Details Patient Name: [name] Date of Service: 07/05/2015 8:45 AM Medical Record Number: [id number] Patient Account Number: [id number] Date of Birth/Gender: Jul 11, 1935 (80 y.o. Male) Treating RN: Leonard Downing Primary Care Physician: Gabriel Cirri Other Clinician: Referring Physician: Gabriel Cirri Treating Physician/Extender: Rudene Re in Treatment: 0 Education Assessment Education Provided To: Patient and Caregiver Education Topics Provided Wound Debridement: Handouts: Wound Debridement Methods: Explain/Verbal Responses: State content correctly Wound/Skin Impairment: Handouts: Caring for Your Ulcer, Skin Care Do's and Dont's Methods: Explain/Verbal Responses: State content correctly Electronic Signature(s) Signed: 07/05/2015 3:31:28 PM By: Lucrezia Starch, RN, Sendra Entered By: Lucrezia Starch, RN, Sendra on 07/05/2015 09:22:14 [name] ([id number]) -------------------------------------------------------------------------------- Wound Assessment Details Patient Name: [name] Date of Service: 07/05/2015 8:45 AM Medical Record Number: [id number] Patient Account Number: [id number] Date of Birth/Sex: 11/07/1935 (80 y.o. Male) Treating RN: Leonard Downing Primary Care Physician: Gabriel Cirri Other Clinician: Referring Physician: Gabriel Cirri Treating Physician/Extender: Rudene Re in Treatment: 0 Wound Status Wound Number: 1 Primary Etiology: Trauma, Other Wound Location: Left Lower Leg Wound Status: Open Wounding Event: Trauma Comorbid History: Angina, Hypertension Date Acquired: 06/14/2015 Weeks Of Treatment: 0 Clustered Wound: No Photos Photo Uploaded By: Lucrezia Starch, RN, Rosalio Macadamia on 07/05/2015 11:14:25 Wound Measurements Length: (cm)  1.8 % Reduction i Width: (cm) 0.9 % Reduction i Depth: (cm) 0.1 Tunneling: Area: (cm) 1.272 Undermining: Volume: (cm) 0.127 n Area: 0% n Volume: 0% No No Wound Description Classification: Partial Thickness Foul Odor Aft Wound Margin: Flat and Intact Exudate Amount: None Present er Cleansing: No Wound Bed Granulation Amount: Small (1-33%) Exposed Structure Necrotic Amount: Large (67-100%) Fascia Exposed: No Necrotic Quality: Eschar, Adherent Slough Fat Layer Exposed: No Tendon Exposed: No Muscle Exposed: No Joint Exposed: No SORIANO, Gurkirat ([id number]) Bone Exposed: No Limited to Skin Breakdown Periwound Skin Texture Texture Color No Abnormalities Noted: No No Abnormalities Noted: No Callus: No Atrophie Blanche: No Crepitus: No Cyanosis: No Excoriation: No Ecchymosis: No Fluctuance: No Erythema: No Friable: No Hemosiderin Staining: No Induration: No Mottled: No Localized Edema: No Pallor: No Rash: No Rubor: No Scarring: No Temperature / Pain Moisture Temperature: No Abnormality No Abnormalities Noted: No Dry / Scaly: Yes Maceration: No Moist: No Wound Preparation Ulcer Cleansing: Rinsed/Irrigated with Saline Topical Anesthetic Applied: Other: lidocaine 4%, Treatment Notes Wound #1 (Left Lower Leg) 1. Cleansed with: Clean wound with Normal Saline 2. Anesthetic Topical Lidocaine 4% cream to wound bed prior to debridement 4. Dressing Applied: Santyl Ointment 5. Secondary Dressing Applied Bordered Foam Dressing Notes patient given instructions on using [employer] is unable to purchase [employer]) Signed: 07/05/2015 3:31:28 PM By: Lucrezia Starch, RN, Sendra Entered By: Lucrezia Starch RN, Sendra on 07/05/2015 09:30:54 [name] ([id number]) -------------------------------------------------------------------------------- Vitals Details Patient Name: [name] Date of Service: 07/05/2015 8:45 AM Medical Record Number:  [id number] Patient Account Number: [id number] Date of Birth/Sex: Oct 31, 1935 (80 y.o. Male) Treating RN: Leonard Downing Primary Care Physician: Gabriel Cirri Other Clinician: Referring Physician: Gabriel Cirri Treating Physician/Extender: Rudene Re in Treatment: 0 Vital Signs Time Taken: 09:15  Temperature (F): 98.0 Height (in): 64 Pulse (bpm): 67 Source: Stated Blood Pressure (mmHg): 159/80 Weight (lbs): 190 Reference Range: 80 - 120 mg / dl Source: Stated Body Mass Index (BMI): 32.6 Electronic Signature(s) Signed: 07/05/2015 3:31:28 PM By: Lucrezia Starch RN, Sendra Entered By: Lucrezia Starch RN, Sendra on 07/05/2015 09:23:39

## 2015-07-12 ENCOUNTER — Encounter: Payer: Medicaid Other | Attending: Surgery | Admitting: Surgery

## 2015-07-12 DIAGNOSIS — E785 Hyperlipidemia, unspecified: Secondary | ICD-10-CM | POA: Diagnosis not present

## 2015-07-12 DIAGNOSIS — N183 Chronic kidney disease, stage 3 (moderate): Secondary | ICD-10-CM | POA: Diagnosis not present

## 2015-07-12 DIAGNOSIS — L97222 Non-pressure chronic ulcer of left calf with fat layer exposed: Secondary | ICD-10-CM | POA: Diagnosis not present

## 2015-07-12 DIAGNOSIS — S81812A Laceration without foreign body, left lower leg, initial encounter: Secondary | ICD-10-CM | POA: Insufficient documentation

## 2015-07-12 DIAGNOSIS — X58XXXA Exposure to other specified factors, initial encounter: Secondary | ICD-10-CM | POA: Insufficient documentation

## 2015-07-12 DIAGNOSIS — I129 Hypertensive chronic kidney disease with stage 1 through stage 4 chronic kidney disease, or unspecified chronic kidney disease: Secondary | ICD-10-CM | POA: Diagnosis not present

## 2015-07-12 NOTE — Progress Notes (Signed)
MASASHI, SNOWDON ([id number]) Visit Report for 07/12/2015 Chief Complaint Document Details Patient Name: Gerald Hodges, Gerald Hodges Date of Service: 07/12/2015 8:45 AM Medical Record Number: [id number] Patient Account Number: [id number] Date of Birth/Sex: [birth year] (80 y.o. Male) Treating RN: Leonard Downing Primary Care Physician: Gabriel Cirri Other Clinician: Referring Physician: Gabriel Cirri Treating Physician/Extender: Rudene Re in Treatment: 1 Information Obtained from: Patient Chief Complaint Patient presents to the wound care center for a consult due non healing wound to the left lower extremity which he sustained about 3 weeks ago. The patient speaks Spanish and is seen with an Print production planner) Signed: 07/12/2015 9:22:30 AM By: Evlyn Kanner MD, FACS Entered By: Evlyn Kanner on 07/12/2015 09:22:30 Gerald Hodges ([id number]) -------------------------------------------------------------------------------- Debridement Details Patient Name: Gerald Hodges Date of Service: 07/12/2015 8:45 AM Medical Record Number: [id number] Patient Account Number: [id number] Date of Birth/Sex: 03-02-1936 (80 y.o. Male) Treating RN: Leonard Downing Primary Care Physician: Gabriel Cirri Other Clinician: Referring Physician: Gabriel Cirri Treating Physician/Extender: Rudene Re in Treatment: 1 Debridement Performed for Wound #1 Left Lower Leg Assessment: Performed By: Physician Evlyn Kanner, MD Debridement: Debridement Pre-procedure Yes Verification/Time Out Taken: Start Time: 09:11 Pain Control: Lidocaine 4% Topical Solution Level: Skin/Subcutaneous Tissue Total Area Debrided (L x 1.8 (cm) x 0.9 (cm) = 1.62 (cm) W): Tissue and other Viable, Non-Viable, Fibrin/Slough, Subcutaneous material debrided: Instrument: Curette Bleeding: Minimum End Time: 09:12 Procedural Pain: 0 Post Procedural Pain: 0 Response to Treatment: Procedure was tolerated  well Post Debridement Measurements of Total Wound Length: (cm) 1.8 Width: (cm) 1 Depth: (cm) 0.1 Volume: (cm) 0.141 Post Procedure Diagnosis Same as Pre-procedure Electronic Signature(s) Signed: 07/12/2015 9:22:24 AM By: Evlyn Kanner MD, FACS Signed: 07/12/2015 4:34:19 PM By: Lucrezia Starch RN, Sendra Previous Signature: 07/12/2015 9:22:02 AM Version By: Evlyn Kanner MD, FACS Entered By: Evlyn Kanner on 07/12/2015 09:22:24 Gerald Hodges ([id number]) -------------------------------------------------------------------------------- HPI Details Patient Name: Gerald Hodges Date of Service: 07/12/2015 8:45 AM Medical Record Number: [id number] Patient Account Number: [id number] Date of Birth/Sex: October 10, 1935 (80 y.o. Male) Treating RN: Leonard Downing Primary Care Physician: Gabriel Cirri Other Clinician: Referring Physician: Gabriel Cirri Treating Physician/Extender: Rudene Re in Treatment: 1 History of Present Illness Location: left lower extremity Quality: Patient reports experiencing a dull pain to affected area(s). Severity: Patient states wound are getting worse. Duration: Patient has had the wound for < 3 weeks prior to presenting for treatment Timing: Pain in wound is Intermittent (comes and goes Context: The wound occurred when the patient injured himself with a hammer which caused a wound on the left lower extremity Modifying Factors: Other treatment(s) tried include:hydrogen peroxide and some home remedies Associated Signs and Symptoms: Patient reports having increase swelling. HPI Description: 80 year old Hispanic gentleman who is seen with the interpreter for a injury to the left lower extremity sustained about 3 weeks ago with a gamma which tore his skin open. He has been using some home remedies and hydrogen peroxide to clean this wound and it has gotten rather dry with no signs of infection. Has a past medical history of hypertension and does not have any other  issues. the patient has a past medical history of hyperlipidemia and hypertension but has not been taking medications for a long while. he is also known to have chronic kidney disease stage III and had stable angina. 07/12/2015 -- he has received a Santyl ointment has been using it for the last couple of days. No other issues. Electronic Signature(s) Signed: 07/12/2015 9:22:49 AM By: Evlyn Kanner MD, FACS Entered By: Meyer Russel  Kjell Brannen on 07/12/2015 09:22:49 Gerald Hodges, Gerald Hodges ([id number]) -------------------------------------------------------------------------------- Physical Exam Details Patient Name: Gerald Hodges, Gerald Hodges Date of Service: 07/12/2015 8:45 AM Medical Record Number: [id number] Patient Account Number: [id number] Date of Birth/Sex: [birth year] (80 y.o. Male) Treating RN: Leonard Downing Primary Care Physician: Gabriel Cirri Other Clinician: Referring Physician: Gabriel Cirri Treating Physician/Extender: Rudene Re in Treatment: 1 Constitutional . Pulse regular. Respirations normal and unlabored. Afebrile. . Eyes Nonicteric. Reactive to light. Ears, Nose, Mouth, and Throat Lips, teeth, and gums WNL.Marland Kitchen Moist mucosa without lesions. Neck supple and nontender. No palpable supraclavicular or cervical adenopathy. Normal sized without goiter. Respiratory WNL. No retractions.. Breath sounds WNL, No rubs, rales, rhonchi, or wheeze.. Cardiovascular Heart rhythm and rate regular, no murmur or gallop.. Pedal Pulses WNL. No clubbing, cyanosis or edema. Lymphatic No adneopathy. No adenopathy. No adenopathy. Musculoskeletal Adexa without tenderness or enlargement.. Digits and nails w/o clubbing, cyanosis, infection, petechiae, ischemia, or inflammatory conditions.. Integumentary (Hair, Skin) No suspicious lesions. No crepitus or fluctuance. No peri-wound warmth or erythema. No masses.Marland Kitchen Psychiatric Judgement and insight Intact.. No evidence of depression, anxiety, or  agitation.. Notes the wound on the left lower extremity continues to have subcutaneous debris and using a #3 curet I have been able to sharply debride some of the necrotic tissue down to the subcutaneous granulation tissue and bleeding was controlled with pressure Electronic Signature(s) Signed: 07/12/2015 9:23:24 AM By: Evlyn Kanner MD, FACS Entered By: Evlyn Kanner on 07/12/2015 09:23:23 Gerald Hodges ([id number]) -------------------------------------------------------------------------------- Physician Orders Details Patient Name: Gerald Hodges Date of Service: 07/12/2015 8:45 AM Medical Record Number: [id number] Patient Account Number: [id number] Date of Birth/Sex: Jul 03, 1935 (80 y.o. Male) Treating RN: Leonard Downing Primary Care Physician: Gabriel Cirri Other Clinician: Referring Physician: Gabriel Cirri Treating Physician/Extender: Rudene Re in Treatment: 1 Verbal / Phone Orders: Yes Clinician: Leonard Downing Read Back and Verified: Yes Diagnosis Coding Wound Cleansing Wound #1 Left Lower Leg o Cleanse wound with mild soap and water Primary Wound Dressing Wound #1 Left Lower Leg o Santyl Ointment Secondary Dressing Wound #1 Left Lower Leg o Boardered Foam Dressing Dressing Change Frequency Wound #1 Left Lower Leg o Change dressing every day. Follow-up Appointments Wound #1 Left Lower Leg o Return Appointment in 1 week. Electronic Signature(s) Signed: 07/12/2015 4:16:10 PM By: Evlyn Kanner MD, FACS Signed: 07/12/2015 4:34:19 PM By: Lucrezia Starch RN, Lennice Sites By: Lucrezia Starch RN, Sendra on 07/12/2015 09:12:54 Gerald Hodges ([id number]) -------------------------------------------------------------------------------- Problem List Details Patient Name: Gerald Hodges Date of Service: 07/12/2015 8:45 AM Medical Record Number: [id number] Patient Account Number: [id number] Date of Birth/Sex: [birth year] (80 y.o. 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 Male) Treating RN: Leonard Downing Primary Care Physician: Gabriel Cirri Other Clinician: Referring Physician: Gabriel Cirri Treating Physician/Extender: Rudene Re in Treatment: 1 Subjective Chief Complaint Information obtained from Patient Patient presents to the wound care center for a consult due non healing wound to the left lower  extremity which he sustained about 3 weeks ago. The patient speaks Spanish and is seen with an interpreter History of Present Illness (HPI) The following HPI elements were documented for the patient's wound: Location: left lower extremity Quality: Patient reports experiencing a dull pain to affected area(s). Severity: Patient states wound are getting worse. Duration: Patient has had the wound for < 3 weeks prior to presenting for treatment Timing: Pain in wound is Intermittent (comes and goes Context: The wound occurred when the patient injured himself with a hammer which caused a wound on the left lower extremity Modifying Factors: Other treatment(s) tried  include:hydrogen peroxide and some home remedies Associated Signs and Symptoms: Patient reports having increase swelling. 80 year old Hispanic gentleman who is seen with the interpreter for a injury to the left lower extremity sustained about 3 weeks ago with a gamma which tore his skin open. He has been using some home remedies and hydrogen peroxide to clean this wound and it has gotten rather dry with no signs of infection. Has a past medical history of hypertension and does not have any other issues. the patient has a past medical history of hyperlipidemia and hypertension but has not been taking medications for a long while. he is also known to have chronic kidney disease stage III and had stable angina. 07/12/2015 -- he has received a Santyl ointment has been using it for the last couple of days. No other issues. Objective Constitutional Pulse regular. Respirations normal and unlabored. Afebrile. Vitals Time Taken: 9:00 AM, Height: 64 in, Weight: 190 lbs, BMI: 32.6, Temperature: 97.6 F, Pulse: 75 Gerald Hodges, Gerald Hodges ([id number]) bpm, Respiratory Rate: 16 breaths/min, Blood Pressure: 138/72 mmHg. Eyes Nonicteric. Reactive to light. Ears, Nose, Mouth, and Throat Lips, teeth, and gums WNL.Marland Kitchen Moist mucosa without lesions. Neck supple and nontender. No palpable supraclavicular or cervical adenopathy. Normal sized without goiter. Respiratory WNL. No retractions.. Breath sounds WNL, No rubs, rales, rhonchi, or wheeze.. Cardiovascular Heart rhythm and rate regular, no murmur or gallop.. Pedal Pulses WNL. No clubbing, cyanosis or edema. Lymphatic No adneopathy. No adenopathy. No adenopathy. Musculoskeletal Adexa without tenderness or enlargement.. Digits and nails w/o clubbing, cyanosis, infection, petechiae, ischemia, or inflammatory conditions.Marland Kitchen Psychiatric Judgement and insight Intact.. No evidence of depression, anxiety, or agitation.. General Notes: the wound on the left lower  extremity continues to have subcutaneous debris and using a #3 curet I have been able to sharply debride some of the necrotic tissue down to the subcutaneous granulation tissue and bleeding was controlled with pressure Integumentary (Hair, Skin) No suspicious lesions. No crepitus or fluctuance. No peri-wound warmth or erythema. No masses.. Wound #1 status is Open. Original cause of wound was Trauma. The wound is located on the Left Lower Leg. The wound measures 1.8cm length x 1cm width x 0.1cm depth; 1.414cm^2 area and 0.141cm^3 volume. The wound is limited to skin breakdown. There is no tunneling or undermining noted. There is a none present amount of drainage noted. The wound margin is flat and intact. There is medium (34-66%) red granulation within the wound bed. There is a medium (34-66%) amount of necrotic tissue within the wound bed including Eschar and Adherent Slough. The periwound skin appearance exhibited: Moist. The periwound skin appearance did not exhibit: Callus, Crepitus, Excoriation, Fluctuance, Friable, Induration, Localized Edema, Rash, Scarring, Dry/Scaly, Maceration, Atrophie Blanche, Cyanosis, Ecchymosis, Hemosiderin Staining, Mottled, Pallor, Rubor, Erythema. Periwound temperature was noted as No Abnormality. The periwound has tenderness on palpation. Assessment Gerald Hodges, Gerald Hodges ([id number]) Active Problems ICD-10 [phone] - Non-pressure chronic  ulcer of left calf with fat layer exposed S81.812A - Laceration without foreign body, left lower leg, initial encounter Procedures Wound #1 Wound #1 is a Trauma, Other located on the Left Lower Leg . There was a Skin/Subcutaneous Tissue Debridement ([id number]) debridement with total area of 1.62 sq cm performed by Evlyn Kanner, MD. with the following instrument(s): Curette to remove Viable and Non-Viable tissue/material including Fibrin/Slough and Subcutaneous after achieving pain control using Lidocaine 4% Topical Solution. A  time out was conducted prior to the start of the procedure. A Minimum amount of bleeding was controlled with N/A. The procedure was tolerated well with a pain level of 0 throughout and a pain level of 0 following the procedure. Post Debridement Measurements: 1.8cm length x 1cm width x 0.1cm depth; 0.141cm^3 volume. Post procedure Diagnosis Wound #1: Same as Pre-Procedure Plan Wound Cleansing: Wound #1 Left Lower Leg: Cleanse wound with mild soap and water Primary Wound Dressing: Wound #1 Left Lower Leg: Santyl Ointment Secondary Dressing: Wound #1 Left Lower Leg: Boardered Foam Dressing Dressing Change Frequency: Wound #1 Left Lower Leg: Change dressing every day. Follow-up Appointments: Wound #1 Left Lower Leg: Return Appointment in 1 week. Gerald Hodges, Gerald Hodges ([id number]) I have asked him to continue using Santyl ointment daily after washing with soap and water and also recommended elevation and exercise. He is being compliant and all questions answered with the help of interpreter Electronic Signature(s) Signed: 07/12/2015 2:55:19 PM By: Evlyn Kanner MD, FACS Previous Signature: 07/12/2015 9:23:48 AM Version By: Evlyn Kanner MD, FACS Entered By: Evlyn Kanner on 07/12/2015 14:55:18 Gerald Hodges ([id number]) -------------------------------------------------------------------------------- SuperBill Details Patient Name: Gerald Hodges Date of Service: 07/12/2015 Medical Record Number: [id number] Patient Account Number: [id number] Date of Birth/Sex: June 23, 1935 (80 y.o. 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

## 2015-07-12 NOTE — Progress Notes (Signed)
Gerald Hodges ([id number]) Visit Report for 07/12/2015 Arrival Information Details Patient Name: Gerald Hodges Date of Service: 07/12/2015 8:45 AM Medical Record Number: [id number] Patient Account Number: [id number] Date of Birth/Sex: 1935-12-19 (80 y.o. Male) Treating RN: Gerald Hodges Primary Care Physician: Gerald Hodges Other Clinician: Referring Physician: Gabriel Hodges Treating Physician/Extender: Gerald Hodges in Treatment: 1 Visit Information History Since Last Visit Added or deleted any medications: No Patient Arrived: Ambulatory Any new allergies or adverse reactions: No Arrival Time: 08:54 Had a fall or experienced change in No Accompanied By: son and activities of daily living that may affect intrepreter risk of falls: Transfer Assistance: None Signs or symptoms of abuse/neglect since last No Patient Identification Verified: Yes visito Secondary Verification Process Yes Hospitalized since last visit: No Completed: Has Dressing in Place as Prescribed: Yes Patient Requires Transmission- No Pain Present Now: No Based Precautions: Patient Has Alerts: Yes Patient Alerts: 07/05/15 ABI (L) 0.94 Electronic Signature(s) Signed: 07/12/2015 2:37:38 PM By: Gerald Gurney, RN, BSN, Kim RN, BSN Entered By: Gerald Gurney, RN, BSN, Gerald Hodges on 07/12/2015 08:59:34 Gerald Hodges ([id number]) -------------------------------------------------------------------------------- Encounter Discharge Information Details Patient Name: Gerald Hodges Date of Service: 07/12/2015 8:45 AM Medical Record Number: [id number] Patient Account Number: [id number] Date of Birth/Sex: 12-Oct-1935 (79 y.o. Male) Treating RN: Gerald Hodges Primary Care Physician: Gerald Hodges Other Clinician: Referring Physician: Gabriel Hodges Treating Physician/Extender: Gerald Hodges in Treatment: 1 Encounter Discharge Information Items Schedule Follow-up Appointment: No Medication Reconciliation completed No and  provided to Patient/Care Gerald Hodges: Provided on Clinical Summary of Care: 07/12/2015 Form Type Recipient Paper Patient AS Electronic Signature(s) Signed: 07/12/2015 9:18:38 AM By: Gerald Hodges Entered By: Gerald Hodges on 07/12/2015 09:18:38 Gerald Hodges ([id number]) -------------------------------------------------------------------------------- Lower Extremity Assessment Details Patient Name: Gerald Hodges Date of Service: 07/12/2015 8:45 AM Medical Record Number: [id number] Patient Account Number: [id number] Date of Birth/Sex: [birth year] (80 y.o. Male) Treating RN: Gerald Hodges Primary Care Physician: Gerald Hodges Other Clinician: Referring Physician: Gabriel Hodges Treating Physician/Extender: Gerald Hodges in Treatment: 1 Edema Assessment Assessed: [Left: No] [Right: No] E[Left: dema] [Right: :] Calf Left: Right: Point of Measurement: 30 cm From Medial Instep 37.4 cm cm Ankle Left: Right: Point of Measurement: 10 cm From Medial Instep 25 cm cm Vascular Assessment Pulses: Posterior Tibial Palpable: [Left:Yes] Dorsalis Pedis Palpable: [Left:Yes] Extremity colors, hair growth, and conditions: Extremity Color: [Left:Normal] Hair Growth on Extremity: [Left:Yes] Temperature of Extremity: [Left:Cold] Capillary Refill: [Left:< 3 seconds] Toe Nail Assessment Left: Right: Thick: No Discolored: No Deformed: No Improper Length and Hygiene: No Electronic Signature(s) Signed: 07/12/2015 2:37:38 PM By: Gerald Gurney, RN, BSN, Kim RN, BSN Entered By: Gerald Gurney, RN, BSN, Gerald Hodges on 07/12/2015 09:02:44 Gerald Hodges ([id number]) Gerald Hodges, Gerald Hodges ([id number]) -------------------------------------------------------------------------------- Pain Assessment Details Patient Name: Gerald Hodges Date of Service: 07/12/2015 8:45 AM Medical Record Number: [id number] Patient Account Number: [id number] Date of Birth/Sex: 19-Sep-1935 (80 y.o. Male) Treating RN: Gerald Hodges Primary Care  Physician: Gerald Hodges Other Clinician: Referring Physician: Gabriel Hodges Treating Physician/Extender: Gerald Hodges in Treatment: 1 Active Problems Location of Pain Severity and Description of Pain Patient Has Paino No Site Locations Pain Management and Medication Current Pain Management: Electronic Signature(s) Signed: 07/12/2015 2:37:38 PM By: Gerald Gurney, RN, BSN, Kim RN, BSN Entered By: Gerald Gurney, RN, BSN, Gerald Hodges on 07/12/2015 09:00:25 Gerald Hodges ([id number]) -------------------------------------------------------------------------------- Patient/Caregiver Education Details Patient Name: Gerald Hodges Date of Service: 07/12/2015 8:45 AM Medical Record Number: [id number] Patient Account Number: [id number] Date of Birth/Gender: 1935/07/07 (80 y.o. Male) Treating RN: Gerald Hodges Primary Care Physician: Gerald Hodges Other Clinician: Referring Physician:  WICKER, Hodges Treating Physician/Extender: Gerald Hodges in Treatment: 1 Education Assessment Education Provided To: Patient Education Topics Provided Wound/Skin Impairment: Handouts: Caring for Your Ulcer, Other: continue using santyl daily Methods: Demonstration Responses: State content correctly Electronic Signature(s) Signed: 07/12/2015 4:34:19 PM By: Gerald Starch, RN, Gerald Hodges Entered By: Gerald Starch, RN, Gerald Hodges on 07/12/2015 09:19:09 Gerald Hodges ([id number]) -------------------------------------------------------------------------------- Wound Assessment Details Patient Name: Gerald Hodges Date of Service: 07/12/2015 8:45 AM Medical Record Number: [id number] Patient Account Number: [id number] Date of Birth/Sex: 20-Mar-1935 (80 y.o. Male) Treating RN: Gerald Hodges Primary Care Physician: Gerald Hodges Other Clinician: Referring Physician: Gabriel Hodges Treating Physician/Extender: Gerald Hodges in Treatment: 1 Wound Status Wound Number: 1 Primary Etiology: Trauma, Other Wound Location:  Left Lower Leg Wound Status: Open Wounding Event: Trauma Comorbid History: Angina, Hypertension Date Acquired: 06/14/2015 Weeks Of Treatment: 1 Clustered Wound: No Photos Photo Uploaded By: Gerald Gurney, RN, BSN, Gerald Hodges on 07/12/2015 09:26:50 Wound Measurements Length: (cm) 1.8 Width: (cm) 1 Depth: (cm) 0.1 Area: (cm) 1.414 Volume: (cm) 0.141 % Reduction in Area: -11.2% % Reduction in Volume: -11% Epithelialization: Small (1-33%) Tunneling: No Undermining: No Wound Description Classification: Partial Thickness Foul Odor Aft Wound Margin: Flat and Intact Exudate Amount: None Present er Cleansing: No Wound Bed Granulation Amount: Medium (34-66%) Exposed Structure Granulation Quality: Red Fascia Exposed: No Necrotic Amount: Medium (34-66%) Fat Layer Exposed: No Necrotic Quality: Eschar, Adherent Slough Tendon Exposed: No Muscle Exposed: No Joint Exposed: No SORIANO, Maijor ([id number]) Bone Exposed: No Limited to Skin Breakdown Periwound Skin Texture Texture Color No Abnormalities Noted: No No Abnormalities Noted: No Callus: No Atrophie Blanche: No Crepitus: No Cyanosis: No Excoriation: No Ecchymosis: No Fluctuance: No Erythema: No Friable: No Hemosiderin Staining: No Induration: No Mottled: No Localized Edema: No Pallor: No Rash: No Rubor: No Scarring: No Temperature / Pain Moisture Temperature: No Abnormality No Abnormalities Noted: No Tenderness on Palpation: Yes Dry / Scaly: No Maceration: No Moist: Yes Wound Preparation Ulcer Cleansing: Rinsed/Irrigated with Saline Topical Anesthetic Applied: Other: lidocaine 4%, Treatment Notes Wound #1 (Left Lower Leg) 1. Cleansed with: Clean wound with Normal Saline 2. Anesthetic Topical Lidocaine 4% cream to wound bed prior to debridement 4. Dressing Applied: Santyl Ointment 5. Secondary Dressing Applied Bordered Foam Dressing Electronic Signature(s) Signed: 07/12/2015 4:34:19 PM By: Gerald Starch, RN,  Gerald Hodges Entered By: Gerald Starch RN, Gerald Hodges on 07/12/2015 09:25:10 Gerald Hodges ([id number]) -------------------------------------------------------------------------------- Vitals Details Patient Name: Gerald Hodges Date of Service: 07/12/2015 8:45 AM Medical Record Number: [id number] Patient Account Number: [id number] Date of Birth/Sex: 11-01-1935 (80 y.o. Male) Treating RN: Gerald Hodges Primary Care Physician: Gerald Hodges Other Clinician: Referring Physician: Gabriel Hodges Treating Physician/Extender: Gerald Hodges in Treatment: 1 Vital Signs Time Taken: 09:00 Temperature (F): 97.6 Height (in): 64 Pulse (bpm): 75 Weight (lbs): 190 Respiratory Rate (breaths/min): 16 Body Mass Index (BMI): 32.6 Blood Pressure (mmHg): 138/72 Reference Range: 80 - 120 mg / dl Electronic Signature(s) Signed: 07/12/2015 2:37:38 PM By: Gerald Gurney, RN, BSN, Kim RN, BSN Entered By: Gerald Gurney, RN, BSN, Gerald Hodges on 07/12/2015 09:01:14

## 2015-07-16 ENCOUNTER — Ambulatory Visit (INDEPENDENT_AMBULATORY_CARE_PROVIDER_SITE_OTHER): Payer: Medicaid Other

## 2015-07-16 ENCOUNTER — Encounter: Payer: Self-pay | Admitting: Sports Medicine

## 2015-07-16 ENCOUNTER — Ambulatory Visit (INDEPENDENT_AMBULATORY_CARE_PROVIDER_SITE_OTHER): Payer: Medicaid Other | Admitting: Sports Medicine

## 2015-07-16 DIAGNOSIS — M216X9 Other acquired deformities of unspecified foot: Secondary | ICD-10-CM

## 2015-07-16 DIAGNOSIS — M779 Enthesopathy, unspecified: Secondary | ICD-10-CM

## 2015-07-16 DIAGNOSIS — M79673 Pain in unspecified foot: Secondary | ICD-10-CM

## 2015-07-16 DIAGNOSIS — M21619 Bunion of unspecified foot: Secondary | ICD-10-CM

## 2015-07-16 DIAGNOSIS — M778 Other enthesopathies, not elsewhere classified: Secondary | ICD-10-CM

## 2015-07-16 DIAGNOSIS — M7750 Other enthesopathy of unspecified foot: Secondary | ICD-10-CM

## 2015-07-16 MED ORDER — TRIAMCINOLONE ACETONIDE 10 MG/ML IJ SUSP
10.0000 mg | Freq: Once | INTRAMUSCULAR | Status: DC
Start: 2015-07-16 — End: 2020-01-01

## 2015-07-16 NOTE — Patient Instructions (Signed)
Juanete (Hallux Valgus) (Bunion [Hallux Valgus])  Una protrusin sea de la zona interna del pie, en la base del primer dedo, se denomina hallux valgus o juanete. Un juanete hace que Financial risk analyst dedo forme un [employer] otros dedos. SNTOMAS  Una protrusin sea en la zona interna del pie que hace que el primer dedo se doble y en algunos casos se superponga a los otros dedos.  Piel dura sobre protuberancia sea en la base del dedo gordo del pie (callosidad).  Ocasionalmente, se acumula lquido bajo la piel dura; este lquido puede ser rojo y causar dolor e hinchazn (inflamacin) debido a la irritacin o presin constante.  Dolor y rigidez del pie . CAUSAS  Hay muchas causas, entre las que se incluyen la herencia (factores genticos) o puede deberse a un traumatismo que fuerza al primer dedo a una posicin en la que superpone a los otros dedos.  Los juanetes se asocian con la utilizacin de zapatos de punta angosta (zapatos puntiagudos). LOS RIESGOS AUMENTAN CON  Historial familiar de anormalidades en el pie, especialmente juanetes.  Artritis  Zapatos angostos, en particular con tacos altos. PREVENCIN  Utilice zapatos con una zona amplia para el dedo gordo.  Evite los zapatos con Editor, commissioning.  Utilice una pequea almohadilla entre el dedo gordo y [place].  Mantenga un estado fsico adecuado:  La flexibilidad en el tobillo y la pierna  [place] y resistencia del msculo PRONSTICO Con un tratamiento adecuado, los juanetes pueden curarse. En ocasiones requiere someterse a [place].  POSIBLES COMPLICACIONES:  Infecciones en el juanete.  Artritis en el dedo gordo.  Riesgos de [place], que incluyen infeccin, Restaurant manager, fast food, lesiones a los nervios (dedo entumecido), Investment banker, operational, sobrecorreccin (el dedo apunta Fish farm manager), artritis del dedo gordo, el dedo gordo apunta hacia arriba, el hueso no est curado. TRATAMIENTO El tratamiento inicial consiste en la  suspensin de las actividades que agravan el dolor, la utilizacin de analgsicos y aplicacin de hielo para reducir la inflamacin y Chief Technology Officer. Utilice zapatos con una zona amplia para el dedo gordo. Puede hacer que un zapatero modifique los zapatos para Paramedic la presin sobre el juanete, especialmente si no puede hallar zapatos lo suficientemente anchos. Tambin puede colocar un almohadilla con un corte en el centro para reducir la presin [employer]. Ocasionalmente le ser de gran ayuda un apoyo de arco (orttico) para reducir la presin del juanete y Pharmacologist los sntomas. Es importante Chief Financial Officer de Therapist, nutritional y fortalecimiento de los msculos del pie. Puede optar por [employer] un soporte o una almohadilla durante la noche para Tax adviser dedo del segundo. Si no se obtiene xito con Artist, ser necesario someterse a [place]. La ciruga implica la remocin del tejido que se ha desarrollado en exceso y la correccin de la posicin del primer dedo, modificando la alineacin de los [place]. La ciruga del juanete generalmente se realiza como ciruga ambulatoria, lo que significa que podr regresar a su casa el mismo da de la [place]. La ciruga puede suponer el corte del hueso del dedo gordo, en la porcin media o simplemente el corte y Actuary (Tourist information centre manager) de los ligamentos y tejidos blandos alrededor del dedo gordo.  MEDICAMENTOS  Si necesita analgsicos, se recomiendan los antiinflamatorios no esteroides, como aspirina e ibuprofeno y otros calmantes menores, como acetaminofeno.  No los tome dentro de los [address] previos a la [place].  En general, luego de la ciruga se prescriben calmantes. Utilcelos como se le  indique y slo cuando lo necesite.  Los ungentos tpicos tambin pueden [employer]. CALOR Y FRO  El tratamiento con fro [employer] y reduce la inflamacin. El fro debe aplicarse durante 10 a 15 minutos cada 2  3  horas para reducir la inflamacin y Chief Technology Officer e inmediatamente despus de cualquier actividad que agrava los sntomas. Utilice bolsas o un masaje de hielo.  El calor puede usarse antes de Therapist, music y de las actividades de fortalecimiento indicadas por el profesional, el fisioterapeuta o Orthoptist. Utilice una bolsa trmica o un pao hmedo. SOLICITE ATENCIN MDICA SI:   Los sntomas empeoran o no mejoran en 2 semanas, a pesar de Medical illustrator.  Luego de la ciruga presenta fiebre, aumento del dolor, enrojecimiento, hinchazn, supura, hemorragia o incremento de calor alrededor del rea operada.  Presenta sntomas nuevos sin motivo aparente (las drogas utilizadas durante el tratamiento pueden producir [place]).   Esta informacin no tiene Theme park manager el consejo del mdico. Asegrese de hacerle al mdico cualquier pregunta que tenga.   Document Released: 12/14/2005 Document Revised: 07/14/2014 Elsevier Interactive Patient Education [employer]

## 2015-07-16 NOTE — Progress Notes (Signed)
Patient ID: Gerald Hodges, male   DOB: 1935/09/21, 80 y.o.   MRN: [id number] Subjective: Gerald Hodges is a 80 y.o. male patient who presents to office for evaluation of Left>Right bunion pain. Patient complains of progressive pain especially over the last year in the Left>Right foot that starts as pain over the bump with direct pressure and range of motion; patient now has difficulty fitting shoes comfortably. Reports that he can only walk about 30 mins until it hurts; reports that they swell and has had this in total for >10 years but the last year has been more painful. States he was in [place] and was about to have surgery but then came to the [place].  Patient has also tried change in shoes with no relief. Patient denies any other pedal complaints.   Patient is assisted by relative and a translator during this visit.   Patient Active Problem List   Diagnosis Date Noted  . Stable angina (HCC) 06/28/2015  . Bunion of unspecified foot 06/28/2015  . Benign hypertension with chronic kidney disease 10/09/2014  . Gout 10/09/2014  . Essential hypertension, benign 08/18/2014  . Psoriasis 08/18/2014  . Chronic kidney disease, stage III (moderate) 08/18/2014  . Hyperlipemia 08/18/2014  . CFIDS (chronic fatigue and immune dysfunction syndrome) 02/17/2014  . APC (atrial premature contractions) 02/17/2014  . Chronic kidney disease 11/18/2013  . Acid reflux 03/19/2013  . Arteriosclerosis of coronary artery 03/18/2013  . Angina pectoris (HCC) 10/09/2012  . BP (high blood pressure) 10/09/2012  . Breath shortness 10/09/2012  . Chest pain 10/06/2012  . Creatinine elevation 10/06/2012  . HLD (hyperlipidemia) 10/06/2012    Current Outpatient Prescriptions on File Prior to Visit  Medication Sig Dispense Refill  . allopurinol (ZYLOPRIM) 100 MG tablet Take 1 tablet (100 mg total) by mouth daily. 90 tablet 1  . amLODipine (NORVASC) 10 MG tablet Take 1 tablet (10 mg total) by mouth daily. 90 tablet 1  .  aspirin 81 MG tablet Take 81 mg by mouth daily.    Marland Kitchen atorvastatin (LIPITOR) 20 MG tablet Take 1 tablet (20 mg total) by mouth daily. 90 tablet 1  . carvedilol (COREG) 6.25 MG tablet Take 1 tablet (6.25 mg total) by mouth 2 (two) times daily with a meal. 180 tablet 1  . colchicine 0.6 MG tablet Take 1 tablet (0.6 mg total) by mouth daily. 30 tablet 2  . furosemide (LASIX) 40 MG tablet Take 1 tablet (40 mg total) by mouth 2 (two) times daily. Reported on 06/28/2015 90 tablet 1  . isosorbide mononitrate (IMDUR) 60 MG 24 hr tablet Take 1 tablet (60 mg total) by mouth daily. 90 tablet 1  . lisinopril (PRINIVIL,ZESTRIL) 5 MG tablet Take 1 tablet (5 mg total) by mouth daily. 90 tablet 1  . nitroGLYCERIN (NITROSTAT) 0.4 MG SL tablet Place 1 tablet (0.4 mg total) under the tongue every 5 (five) minutes as needed for chest pain (chest pain). 20 tablet 1  . omeprazole (PRILOSEC) 20 MG capsule Take 1 capsule (20 mg total) by mouth daily. 90 capsule 3   No current facility-administered medications on file prior to visit.    No Known Allergies  Objective:  General: Alert and oriented x3 in no acute distress  Dermatology: No open lesions bilateral lower extremities, no webspace macerations, no ecchymosis bilateral, all nails x 10 are well manicured.  Vascular: Dorsalis Pedis and Posterior Tibial pedal pulses 1/4, Capillary Fill Time 3 seconds, (+) scant pedal hair growth bilateral, no edema bilateral lower extremities,  Temperature gradient within normal limits.  Neurology: Gross sensation intact via light touch bilateral, Protective sensation intact  with Semmes Weinstein Monofilament to all pedal sites, Position sense intact, vibratory intact bilateral, Deep tendon reflexes within normal limits bilateral, No babinski sign present bilateral. (-) Tinels sign bilateral.   Musculoskeletal: Mild tenderness with palpation left>right bunion deformity, no crepitus with range of motion, deformity not reducible,  trackbound on left and tracking on right, there is mild 1st ray hypermobility noted bilateral. Midtarsal, Subtalar joint, and ankle joint range of motion is within normal limits.  There is medial arch collapse on weightbearing, HAV deformity supported on ground with second toe crossover deformity noted Left>Right.   Xrays  Right and Left Foot    Impression: Intermetatarsal angle above normal limits Left greater than right suggestive of grade IV bunion with cystic changes at metatarsal head, posterior and inferior spurs, midtarsal breach and arthritis present, soft tissues within normal limits, no fracture, no foreign body.       Assessment and Plan: Problem List Items Addressed This Visit    None    Visit Diagnoses    Foot pain, unspecified laterality    -  Primary    Relevant Orders    DG Foot 2 Views Left    DG Foot 2 Views Right    Bunion        Capsulitis of foot, unspecified laterality        Relevant Medications    triamcinolone acetonide (KENALOG) 10 MG/ML injection 10 mg       -Complete examination performed -Xrays reviewed -Discussed treatement options; discussed HAV deformity;conservative and  Surgical management; risks, benefits, alternatives discussed. All patient's questions answered. -After verbal consent, injected into right and left 1st MTPJ for symptomatic relief 1cc lidocaine plain, 1cc marcaine plain, 0.5cc dexamethasone without complication.   -Recommend good supportive wider shoes and OTC inserts.  -Encouraged rest, ice, elevation as needed -Patient to return to office in 2 months or sooner if condition worsens.  Asencion Islam, DPM

## 2015-07-19 ENCOUNTER — Encounter: Payer: Medicaid Other | Admitting: Surgery

## 2015-07-19 DIAGNOSIS — L97222 Non-pressure chronic ulcer of left calf with fat layer exposed: Secondary | ICD-10-CM | POA: Diagnosis not present

## 2015-07-19 NOTE — Progress Notes (Signed)
SIAM, CLEARY ([id number]) Visit Report for 07/19/2015 Chief Complaint Document Details Patient Name: Gerald Hodges, Gerald Hodges Date of Service: 07/19/2015 10:45 AM Medical Record Number: [id number] Patient Account Number: [id number] Date of Birth/Sex: 1935/12/20 (80 y.o. Male) Treating RN: Leonard Downing Primary Care Physician: Gabriel Cirri Other Clinician: Referring Physician: Gabriel Cirri Treating Physician/Extender: Rudene Re in Treatment: 2 Information Obtained from: Patient Chief Complaint Patient presents to the wound care center for a consult due non healing wound to the left lower extremity which he sustained about 3 weeks ago. The patient speaks Spanish and is seen with an Print production planner) Signed: 07/19/2015 11:26:12 AM By: Evlyn Kanner MD, FACS Entered By: Evlyn Kanner on 07/19/2015 11:26:12 Gerald Hodges ([id number]) -------------------------------------------------------------------------------- HPI Details Patient Name: Gerald Hodges Date of Service: 07/19/2015 10:45 AM Medical Record Number: [id number] Patient Account Number: [id number] Date of Birth/Sex: 1935-12-28 (80 y.o. Male) Treating RN: Leonard Downing Primary Care Physician: Gabriel Cirri Other Clinician: Referring Physician: Gabriel Cirri Treating Physician/Extender: Rudene Re in Treatment: 2 History of Present Illness Location: left lower extremity Quality: Patient reports experiencing a dull pain to affected area(s). Severity: Patient states wound are getting worse. Duration: Patient has had the wound for < 3 weeks prior to presenting for treatment Timing: Pain in wound is Intermittent (comes and goes Context: The wound occurred when the patient injured himself with a hammer which caused a wound on the left lower extremity Modifying Factors: Other treatment(s) tried include:hydrogen peroxide and some home remedies Associated Signs and Symptoms: Patient reports  having increase swelling. HPI Description: 80 year old Hispanic gentleman who is seen with the interpreter for a injury to the left lower extremity sustained about 3 weeks ago with a gamma which tore his skin open. He has been using some home remedies and hydrogen peroxide to clean this wound and it has gotten rather dry with no signs of infection. Has a past medical history of hypertension and does not have any other issues. the patient has a past medical history of hyperlipidemia and hypertension but has not been taking medications for a long while. he is also known to have chronic kidney disease stage III and had stable angina. 07/12/2015 -- he has received a Santyl ointment has been using it for the last couple of days. No other issues. Electronic Signature(s) Signed: 07/19/2015 11:26:18 AM By: Evlyn Kanner MD, FACS Entered By: Evlyn Kanner on 07/19/2015 11:26:18 Gerald Hodges ([id number]) -------------------------------------------------------------------------------- Physical Exam Details Patient Name: Gerald Hodges Date of Service: 07/19/2015 10:45 AM Medical Record Number: [id number] Patient Account Number: [id number] Date of Birth/Sex: Jan 22, 1936 (80 y.o. Male) Treating RN: Leonard Downing Primary Care Physician: Gabriel Cirri Other Clinician: Referring Physician: Gabriel Cirri Treating Physician/Extender: Rudene Re in Treatment: 2 Constitutional . Pulse regular. Respirations normal and unlabored. Afebrile. . Eyes Nonicteric. Reactive to light. Ears, Nose, Mouth, and Throat Lips, teeth, and gums WNL.Marland Kitchen Moist mucosa without lesions. Neck supple and nontender. No palpable supraclavicular or cervical adenopathy. Normal sized without goiter. Respiratory WNL. No retractions.. Cardiovascular Pedal Pulses WNL. No clubbing, cyanosis or edema. Lymphatic No adneopathy. No adenopathy. No adenopathy. Musculoskeletal Adexa without tenderness or enlargement.. Digits  and nails w/o clubbing, cyanosis, infection, petechiae, ischemia, or inflammatory conditions.. Integumentary (Hair, Skin) No suspicious lesions. No crepitus or fluctuance. No peri-wound warmth or erythema. No masses.Marland Kitchen Psychiatric Judgement and insight Intact.. No evidence of depression, anxiety, or agitation.. Notes the wound continues to look better and no debridement was required today. I have washed it out with moist saline gauze. Electronic Signature(s)  Signed: 07/19/2015 11:26:50 AM By: Evlyn Kanner MD, FACS Entered By: Evlyn Kanner on 07/19/2015 11:26:49 Gerald Hodges ([id number]) -------------------------------------------------------------------------------- Physician Orders Details Patient Name: Gerald Hodges Date of Service: 07/19/2015 10:45 AM Medical Record Number: [id number] Patient Account Number: [id number] Date of Birth/Sex: 1935/05/02 (80 y.o. Male) Treating RN: Leonard Downing Primary Care Physician: Gabriel Cirri Other Clinician: Referring Physician: Gabriel Cirri Treating Physician/Extender: Rudene Re in Treatment: 2 Verbal / Phone Orders: Yes Clinician: Leonard Downing Read Back and Verified: Yes Diagnosis Coding Wound Cleansing Wound #1 Left Lower Leg o Cleanse wound with mild soap and water Primary Wound Dressing Wound #1 Left Lower Leg o Aquacel Ag Secondary Dressing Wound #1 Left Lower Leg o Boardered Foam Dressing Dressing Change Frequency Wound #1 Left Lower Leg o Change dressing every other day. Follow-up Appointments Wound #1 Left Lower Leg o Return Appointment in 1 week. Electronic Signature(s) Signed: 07/19/2015 3:24:57 PM By: Maureen Chatters Signed: 07/19/2015 4:11:53 PM By: Evlyn Kanner MD, FACS Entered By: Lucrezia Starch RN, Sendra on 07/19/2015 11:17:42 Gerald Hodges ([id number]) -------------------------------------------------------------------------------- Problem List Details Patient Name: Gerald Hodges Date of Service: 07/19/2015 10:45 AM Medical Record Number: [id number] Patient Account Number: [id number] Date of Birth/Sex: 1935-11-11 (80 y.o. 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 Male) Treating RN: Leonard Downing Primary Care Physician: Gabriel Cirri Other Clinician: Referring Physician: Gabriel Cirri Treating Physician/Extender: Rudene Re in Treatment: 2 Subjective Chief Complaint Information obtained from Patient Patient presents to the wound care center for a consult due non healing wound to the left lower extremity which he sustained about 3 weeks ago. The patient speaks Spanish and is seen with an interpreter History of Present Illness (HPI) The following HPI elements were documented for the patient's wound: Location: left lower extremity Quality: Patient reports experiencing a dull pain to affected area(s). Severity: Patient states wound are getting worse. Duration: Patient has had the wound for < 3 weeks prior to presenting for treatment Timing: Pain in wound is Intermittent (comes  and goes Context: The wound occurred when the patient injured himself with a hammer which caused a wound on the left lower extremity Modifying Factors: Other treatment(s) tried include:hydrogen peroxide and some home remedies Associated Signs and Symptoms: Patient reports having increase swelling. 80 year old Hispanic gentleman who is seen with the interpreter for a injury to the left lower extremity sustained about 3 weeks ago with a gamma which tore his skin open. He has been using some home remedies and hydrogen peroxide to clean this wound and it has gotten rather dry with no signs of infection. Has a past medical history of hypertension and does not have any other issues. the patient has a past medical history of hyperlipidemia and hypertension but has not been taking medications for a long while. he is also known to have chronic kidney disease stage III and had stable angina. 07/12/2015 -- he has received a  Santyl ointment has been using it for the last couple of days. No other issues. Objective Constitutional Pulse regular. Respirations normal and unlabored. Afebrile. Vitals Time Taken: 11:12 AM, Height: 64 in, Weight: 190 lbs, BMI: 32.6, Pulse: 63 bpm, Blood Pressure: SORIANO, Darriel ([id number]) 151/84 mmHg. Eyes Nonicteric. Reactive to light. Ears, Nose, Mouth, and Throat Lips, teeth, and gums WNL.Marland Kitchen Moist mucosa without lesions. Neck supple and nontender. No palpable supraclavicular or cervical adenopathy. Normal sized without goiter. Respiratory WNL. No retractions.. Cardiovascular Pedal Pulses WNL. No clubbing, cyanosis or edema. Lymphatic No adneopathy. No adenopathy. No adenopathy. Musculoskeletal Adexa without tenderness or enlargement.. Digits and nails w/o clubbing, cyanosis, infection, petechiae, ischemia, or inflammatory conditions.Marland Kitchen Psychiatric Judgement and insight Intact.. No evidence of depression, anxiety, or agitation.. General Notes: the wound continues  to look better and no debridement was required today. I have washed it out with moist saline gauze. Integumentary (Hair, Skin) No suspicious lesions. No crepitus or fluctuance. No peri-wound warmth or erythema. No masses.. Wound #1 status is Open. Original cause of wound was Trauma. The wound is located on the Left Lower Leg. The wound measures 1.4cm length x 0.4cm width x 0.1cm depth; 0.44cm^2 area and 0.044cm^3 volume. The wound is limited to skin breakdown. There is no tunneling or undermining noted. There is a small amount of drainage noted. The wound margin is flat and intact. There is large (67-100%) red granulation within the wound bed. There is a small (1-33%) amount of necrotic tissue within the wound bed including Adherent Slough. The periwound skin appearance exhibited: Moist. The periwound skin appearance did not exhibit: Callus, Crepitus, Excoriation, Fluctuance, Friable, Induration, Localized Edema, Rash, Scarring, Dry/Scaly, Maceration, Atrophie Blanche, Cyanosis, Ecchymosis, Hemosiderin Staining, Mottled, Pallor, Rubor, Erythema. Periwound temperature was noted as No Abnormality. The periwound has tenderness on palpation. KYLLIAN, CLINGERMAN ([id number]) Assessment Active Problems ICD-10 [phone] - Non-pressure chronic ulcer of left calf with fat layer exposed S81.812A - Laceration without foreign body, left lower leg, initial encounter Plan Wound Cleansing: Wound #1 Left Lower Leg: Cleanse wound with mild soap and water Primary Wound Dressing: Wound #1 Left Lower Leg: Aquacel Ag Secondary Dressing: Wound #1 Left Lower Leg: Boardered Foam Dressing Dressing Change Frequency: Wound #1 Left Lower Leg: Change dressing every other day. Follow-up Appointments: Wound #1 Left Lower Leg: Return Appointment in 1 week. I have asked him to begin using aqua cell AG on every other day after washing with soap and water and also recommended elevation and exercise. He is being  compliant and all questions answered with the help of interpreter. He will come back and see me next week. Electronic Signature(s) Signed: 07/19/2015 11:27:54 AM By: Evlyn Kanner MD, FACS Entered By: Evlyn Kanner on 07/19/2015 11:27:54 Gerald Hodges ([id number]) -------------------------------------------------------------------------------- SuperBill Details Patient Name: Gerald Hodges Date of Service: 07/19/2015 Medical Record Number: [id number] Patient Account Number: [id number] Date of Birth/Sex: 11-Jan-1936 (80 y.o. Male) Treating RN: Leonard Downing Primary Care Physician: Gabriel Cirri Other Clinician: Referring Physician: Gabriel Cirri Treating Physician/Extender: Rudene Re in Treatment: 2 Diagnosis Coding ICD-10 Codes Code Description [phone] Non-pressure chronic ulcer of left calf with fat layer exposed S81.812A Laceration without foreign body, left lower leg, initial encounter Facility Procedures CPT4 Code: [id number] Description: 99213 - WOUND CARE VISIT-LEV 3 EST PT Modifier: Quantity: 1 Physician Procedures CPT4 Code Description: [id number] 99213 - WC PHYS LEVEL 3 - EST PT ICD-10 Description Diagnosis L97.222 Non-pressure chronic ulcer of left calf with fat lay S81.812A Laceration without foreign body, left lower leg, ini  Modifier: er exposed tial encounter Quantity: 1 Electronic Signature(s) Signed: 07/19/2015 3:24:57 PM By: Maureen Chatters Signed: 07/19/2015 4:11:53 PM By: Evlyn Kanner MD, FACS Previous Signature: 07/19/2015 11:28:14 AM Version By: Evlyn Kanner MD, FACS Entered By: Lucrezia Starch RN, Sendra on 07/19/2015 11:39:02

## 2015-07-19 NOTE — Progress Notes (Signed)
Gerald Hodges, Gerald Hodges ([id number]) Visit Report for 07/19/2015 Arrival Information Details Patient Name: Gerald Hodges, Gerald Hodges Date of Service: 07/19/2015 10:45 AM Medical Record Number: [id number] Patient Account Number: [id number] Date of Birth/Sex: September 08, 1935 (80 y.o. Male) Treating RN: Leonard Downing Primary Care Physician: Gabriel Cirri Other Clinician: Referring Physician: Gabriel Cirri Treating Physician/Extender: Rudene Re in Treatment: 2 Visit Information History Since Last Visit All ordered tests and consults were completed: No Patient Arrived: Ambulatory Added or deleted any medications: No Arrival Time: 11:09 Any new allergies or adverse reactions: No Accompanied By: son and Had a fall or experienced change in No Transfer Assistance: None activities of daily living that may affect Patient Identification Verified: Yes risk of falls: Secondary Verification Process Yes Signs or symptoms of abuse/neglect since last No Completed: visito Patient Requires Transmission-Based No Hospitalized since last visit: No Precautions: Has Dressing in Place as Prescribed: Yes Patient Has Alerts: Yes Pain Present Now: Yes Electronic Signature(s) Signed: 07/19/2015 3:24:57 PM By: Lucrezia Starch, RN, Sendra Entered By: Lucrezia Starch RN, Sendra on 07/19/2015 11:10:23 Gerald Hodges ([id number]) -------------------------------------------------------------------------------- Clinic Level of Care Assessment Details Patient Name: Gerald Hodges Date of Service: 07/19/2015 10:45 AM Medical Record Number: [id number] Patient Account Number: [id number] Date of Birth/Sex: 13-Jun-1935 (80 y.o. Male) Treating RN: Leonard Downing Primary Care Physician: Gabriel Cirri Other Clinician: Referring Physician: Gabriel Cirri Treating Physician/Extender: Rudene Re in Treatment: 2 Clinic Level of Care Assessment Items TOOL 4 Quantity Score X - Use when only an EandM is performed on FOLLOW-UP visit  1 0 ASSESSMENTS - Nursing Assessment / Reassessment X - Reassessment of Co-morbidities (includes updates in patient status) 1 10 X - Reassessment of Adherence to Treatment Plan 1 5 ASSESSMENTS - Wound and Skin Assessment / Reassessment X - Simple Wound Assessment / Reassessment - one wound 1 5 []  - Complex Wound Assessment / Reassessment - multiple wounds 0 []  - Dermatologic / Skin Assessment (not related to wound area) 0 ASSESSMENTS - Focused Assessment X - Circumferential Edema Measurements - multi extremities 1 5 []  - Nutritional Assessment / Counseling / Intervention 0 X - Lower Extremity Assessment (monofilament, tuning fork, pulses) 1 5 []  - Peripheral Arterial Disease Assessment (using hand held doppler) 0 ASSESSMENTS - Ostomy and/or Continence Assessment and Care []  - Incontinence Assessment and Management 0 []  - Ostomy Care Assessment and Management (repouching, etc.) 0 PROCESS - Coordination of Care X - Simple Patient / Family Education for ongoing care 1 15 []  - Complex (extensive) Patient / Family Education for ongoing care 0 X - Staff obtains Chiropractor, Records, Test Results / Process Orders 1 10 []  - Staff telephones HHA, Nursing Homes / Clarify orders / etc 0 []  - Routine Transfer to another Facility (non-emergent condition) 0 [facility], Bastian ([id number]) []  - Routine Hospital Admission (non-emergent condition) 0 []  - New Admissions / Manufacturing engineer / Ordering NPWT, Apligraf, etc. 0 []  - Emergency Hospital Admission (emergent condition) 0 X - Simple Discharge Coordination 1 10 []  - Complex (extensive) Discharge Coordination 0 PROCESS - Special Needs []  - Pediatric / Minor Patient Management 0 []  - Isolation Patient Management 0 []  - Hearing / Language / Visual special needs 0 []  - Assessment of Community assistance (transportation, D/C planning, etc.) 0 []  - Additional assistance / Altered mentation 0 []  - Support Surface(s) Assessment (bed, cushion, seat,  etc.) 0 INTERVENTIONS - Wound Cleansing / Measurement X - Simple Wound Cleansing - one wound 1 5 []  - Complex Wound Cleansing - multiple wounds 0 X - Wound Imaging (photographs -  any number of wounds) 1 5 []  - Wound Tracing (instead of photographs) 0 X - Simple Wound Measurement - one wound 1 5 []  - Complex Wound Measurement - multiple wounds 0 INTERVENTIONS - Wound Dressings X - Small Wound Dressing one or multiple wounds 1 10 []  - Medium Wound Dressing one or multiple wounds 0 []  - Large Wound Dressing one or multiple wounds 0 X - Application of Medications - topical 1 5 []  - Application of Medications - injection 0 INTERVENTIONS - Miscellaneous []  - External ear exam 0 SORIANO, Whitley ([id number]) []  - Specimen Collection (cultures, biopsies, blood, body fluids, etc.) 0 []  - Specimen(s) / Culture(s) sent or taken to Lab for analysis 0 []  - Patient Transfer (multiple staff / Michiel Sites Lift / Similar devices) 0 []  - Simple Staple / Suture removal (25 or less) 0 []  - Complex Staple / Suture removal (26 or more) 0 []  - Hypo / Hyperglycemic Management (close monitor of Blood Glucose) 0 []  - Ankle / Brachial Index (ABI) - do not check if billed separately 0 X - Vital Signs 1 5 Has the patient been seen at the hospital within the last three years: Yes Total Score: 100 Level Of Care: New/Established - Level 3 Electronic Signature(s) Signed: 07/19/2015 3:24:57 PM By: Lucrezia Starch, RN, Sendra Entered By: Lucrezia Starch, RN, Sendra on 07/19/2015 11:38:47 Gerald Hodges ([id number]) -------------------------------------------------------------------------------- Encounter Discharge Information Details Patient Name: Gerald Hodges Date of Service: 07/19/2015 10:45 AM Medical Record Number: [id number] Patient Account Number: [id number] Date of Birth/Sex: 1935-05-14 (80 y.o. Male) Treating RN: Leonard Downing Primary Care Physician: Gabriel Cirri Other Clinician: Referring Physician: Gabriel Cirri Treating Physician/Extender: Rudene Re in Treatment: 2 Encounter Discharge Information Items Schedule Follow-up Appointment: No Medication Reconciliation completed No and provided to Patient/Care Gasper Hopes: Provided on Clinical Summary of Care: 07/19/2015 Form Type Recipient Paper Patient AS Electronic Signature(s) Signed: 07/19/2015 11:23:49 AM By: Gwenlyn Perking Entered By: Gwenlyn Perking on 07/19/2015 11:23:49 Gerald Hodges ([id number]) -------------------------------------------------------------------------------- Lower Extremity Assessment Details Patient Name: Gerald Hodges Date of Service: 07/19/2015 10:45 AM Medical Record Number: [id number] Patient Account Number: [id number] Date of Birth/Sex: [birth year] (80 y.o. Male) Treating RN: Leonard Downing Primary Care Physician: Gabriel Cirri Other Clinician: Referring Physician: Gabriel Cirri Treating Physician/Extender: Rudene Re in Treatment: 2 Edema Assessment Assessed: [Left: No] [Right: No] Edema: [Left: N] [Right: o] Calf Left: Right: Point of Measurement: 30 cm From Medial Instep 39 cm cm Ankle Left: Right: Point of Measurement: 10 cm From Medial Instep 24 cm cm Vascular Assessment Pulses: Posterior Tibial Dorsalis Pedis Palpable: [Left:Yes] Extremity colors, hair growth, and conditions: Extremity Color: [Left:Normal] Hair Growth on Extremity: [Left:Yes] Temperature of Extremity: [Left:Warm] Capillary Refill: [Left:< 3 seconds] Dependent Rubor: [Left:No] Blanched when Elevated: [Left:No] Lipodermatosclerosis: [Left:No] Toe Nail Assessment Left: Right: Thick: No Discolored: No Deformed: No Improper Length and Hygiene: No Electronic Signature(s) Signed: 07/19/2015 3:24:57 PM By: Lucrezia Starch RN, Basilio Cairo, Eulogio ([id number]) Entered By: Lucrezia Starch RN, Sendra on 07/19/2015 11:12:25 Gerald Hodges  ([id number]) -------------------------------------------------------------------------------- Pain Assessment Details Patient Name: Gerald Hodges Date of Service: 07/19/2015 10:45 AM Medical Record Number: [id number] Patient Account Number: [id number] Date of Birth/Sex: May 24, 1935 (80 y.o. Male) Treating RN: Leonard Downing Primary Care Physician: Gabriel Cirri Other Clinician: Referring Physician: Gabriel Cirri Treating Physician/Extender: Rudene Re in Treatment: 2 Active Problems Location of Pain Severity and Description of Pain Patient Has Paino Yes Site Locations Pain Location: Pain in Ulcers With Dressing Change: Yes Duration of the Pain. Constant / Intermittento Intermittent Rate the pain.  Current Pain Level: 5 Pain Management and Medication Current Pain Management: Electronic Signature(s) Signed: 07/19/2015 3:24:57 PM By: Lucrezia Starch, RN, Sendra Entered By: Lucrezia Starch, RN, Sendra on 07/19/2015 11:10:37 Gerald Hodges ([id number]) -------------------------------------------------------------------------------- Patient/Caregiver Education Details Patient Name: Gerald Hodges Date of Service: 07/19/2015 10:45 AM Medical Record Number: [id number] Patient Account Number: [id number] Date of Birth/Gender: [birth year] (80 y.o. Male) Treating RN: Leonard Downing Primary Care Physician: Gabriel Cirri Other Clinician: Referring Physician: Gabriel Cirri Treating Physician/Extender: Rudene Re in Treatment: 2 Education Assessment Education Provided To: Patient and Caregiver Education Topics Provided Wound/Skin Impairment: Handouts: Caring for Your Ulcer, Skin Care Do's and Dont's Methods: Explain/Verbal Responses: State content correctly Electronic Signature(s) Signed: 07/19/2015 3:24:57 PM By: Lucrezia Starch, RN, Sendra Entered By: Lucrezia Starch, RN, Sendra on 07/19/2015 11:24:14 Gerald Hodges  ([id number]) -------------------------------------------------------------------------------- Wound Assessment Details Patient Name: Gerald Hodges Date of Service: 07/19/2015 10:45 AM Medical Record Number: [id number] Patient Account Number: [id number] Date of Birth/Sex: December 03, 1935 (80 y.o. Male) Treating RN: Leonard Downing Primary Care Physician: Gabriel Cirri Other Clinician: Referring Physician: Gabriel Cirri Treating Physician/Extender: Rudene Re in Treatment: 2 Wound Status Wound Number: 1 Primary Etiology: Trauma, Other Wound Location: Left Lower Leg Wound Status: Open Wounding Event: Trauma Comorbid History: Angina, Hypertension Date Acquired: 06/14/2015 Weeks Of Treatment: 2 Clustered Wound: No Wound Measurements Length: (cm) 1.4 Width: (cm) 0.4 Depth: (cm) 0.1 Area: (cm) 0.44 Volume: (cm) 0.044 % Reduction in Area: 65.4% % Reduction in Volume: 65.4% Epithelialization: Medium (34-66%) Tunneling: No Undermining: No Wound Description Classification: Partial Thickness Foul Odor Aft Wound Margin: Flat and Intact Exudate Amount: Small er Cleansing: No Wound Bed Granulation Amount: Large (67-100%) Exposed Structure Granulation Quality: Red Fascia Exposed: No Necrotic Amount: Small (1-33%) Fat Layer Exposed: No Necrotic Quality: Adherent Slough Tendon Exposed: No Muscle Exposed: No Joint Exposed: No Bone Exposed: No Limited to Skin Breakdown Periwound Skin Texture Texture Color No Abnormalities Noted: No No Abnormalities Noted: No Callus: No Atrophie Blanche: No Crepitus: No Cyanosis: No Excoriation: No Ecchymosis: No Fluctuance: No Erythema: No Friable: No Hemosiderin Staining: No Induration: No Mottled: No MITCHELL, IWANICKI ([id number]) Localized Edema: No Pallor: No Rash: No Rubor: No Scarring: No Temperature / Pain Moisture Temperature: No Abnormality No Abnormalities Noted: No Tenderness on Palpation: Yes Dry / Scaly:  No Maceration: No Moist: Yes Wound Preparation Ulcer Cleansing: Rinsed/Irrigated with Saline Topical Anesthetic Applied: Other: lidocaine 4%, Treatment Notes Wound #1 (Left Lower Leg) 1. Cleansed with: Clean wound with Normal Saline 2. Anesthetic Topical Lidocaine 4% cream to wound bed prior to debridement 4. Dressing Applied: Aquacel Ag 5. Secondary Dressing Applied Bordered Foam Dressing Electronic Signature(s) Signed: 07/19/2015 3:24:57 PM By: Lucrezia Starch, RN, Sendra Entered By: Lucrezia Starch RN, Sendra on 07/19/2015 11:19:22 Gerald Hodges ([id number]) -------------------------------------------------------------------------------- Vitals Details Patient Name: Gerald Hodges Date of Service: 07/19/2015 10:45 AM Medical Record Number: [id number] Patient Account Number: [id number] Date of Birth/Sex: [birth year] (80 y.o. Male) Treating RN: Leonard Downing Primary Care Physician: Gabriel Cirri Other Clinician: Referring Physician: Gabriel Cirri Treating Physician/Extender: Rudene Re in Treatment: 2 Vital Signs Time Taken: 11:12 Pulse (bpm): 63 Height (in): 64 Blood Pressure (mmHg): 151/84 Weight (lbs): 190 Reference Range: 80 - 120 mg / dl Body Mass Index (BMI): 32.6 Electronic Signature(s) Signed: 07/19/2015 3:24:57 PM By: Lucrezia Starch RN, Sendra Entered By: Lucrezia Starch RN, Sendra on 07/19/2015 11:12:41

## 2015-07-26 ENCOUNTER — Encounter: Payer: Medicaid Other | Admitting: Surgery

## 2015-07-26 DIAGNOSIS — L97222 Non-pressure chronic ulcer of left calf with fat layer exposed: Secondary | ICD-10-CM | POA: Diagnosis not present

## 2015-07-28 ENCOUNTER — Ambulatory Visit: Payer: Medicaid Other | Admitting: Unknown Physician Specialty

## 2015-07-29 NOTE — Progress Notes (Signed)
EMRIK, ERHARD ([id number]) Visit Report for 07/26/2015 Arrival Information Details Patient Name: Gerald Hodges, Gerald Hodges Date of Service: 07/26/2015 8:45 AM Medical Record Number: [id number] Patient Account Number: [id number] Date of Birth/Sex: 1935/11/18 (80 y.o. Male) Treating RN: Phillis Haggis Primary Care Physician: Gabriel Cirri Other Clinician: Referring Physician: Gabriel Cirri Treating Physician/Extender: Rudene Re in Treatment: 3 Visit Information History Since Last Visit All ordered tests and consults were completed: No Patient Arrived: Ambulatory Added or deleted any medications: No Arrival Time: 08:57 Any new allergies or adverse reactions: No Accompanied By: family, translator Had a fall or experienced change in No activities of daily living that may affect Transfer Assistance: None risk of falls: Patient Requires Transmission- No Signs or symptoms of abuse/neglect since last No Based Precautions: visito Patient Has Alerts: Yes Hospitalized since last visit: No Pain Present Now: No Electronic SignatureHodgess) Signed: 07/28/2015 5:19:35 PM By: Alejandro Mulling Entered By: Alejandro Mulling on 07/26/2015 08:58:38 Gerald [id number]) -------------------------------------------------------------------------------- Clinic Level of Care Assessment Details Patient Name: Gerald Hodges Date of Service: 07/26/2015 8:45 AM Medical Record Number: [id number] Patient Account Number: [id number] Date of Birth/Sex: 07-Apr-1935 (80 y.o. Male) Treating RN: Phillis Haggis Primary Care Physician: Gabriel Cirri Other Clinician: Referring Physician: Gabriel Cirri Treating Physician/Extender: Rudene Re in Treatment: 3 Clinic Level of Care Assessment Items TOOL 4 Quantity Score X - Use when only an EandM is performed on FOLLOW-UP visit 1 0 ASSESSMENTS - Nursing Assessment / Reassessment X - Reassessment of Co-morbidities (includes updates in patient  status) 1 10 X - Reassessment of Adherence to Treatment Plan 1 5 ASSESSMENTS - Wound and Skin Assessment / Reassessment X - Simple Wound Assessment / Reassessment - one wound 1 5  - Complex Wound Assessment / Reassessment - multiple wounds 0  - Dermatologic / Skin Assessment (not related to wound area) 0 ASSESSMENTS - Focused Assessment  - Circumferential Edema Measurements - multi extremities 0  - Nutritional Assessment / Counseling / Intervention 0  - Lower Extremity Assessment (monofilament, tuning fork, pulses) 0  - Peripheral Arterial Disease Assessment (using hand held doppler) 0 ASSESSMENTS - Ostomy and/or Continence Assessment and Care  - Incontinence Assessment and Management 0  - Ostomy Care Assessment and Management (repouching, etc.) 0 PROCESS - Coordination of Care X - Simple Patient / Family Education for ongoing care 1 15  - Complex (extensive) Patient / Family Education for ongoing care 0  - Staff obtains Chiropractor, Records, Test Results / Process Orders 0  - Staff telephones HHA, Nursing Homes / Clarify orders / etc 0  - Routine Transfer to another Facility (non-emergent condition) 0 Hypericum, Knute ([id number])  - Routine Hospital Admission (non-emergent condition) 0  - New Admissions / Manufacturing engineer / Ordering NPWT, Apligraf, etc. 0  - Emergency Hospital Admission (emergent condition) 0  - Simple Discharge Coordination 0 X - Complex (extensive) Discharge Coordination 1 15 PROCESS - Special Needs  - Pediatric / Minor Patient Management 0  - Isolation Patient Management 0  - Hearing / Language / Visual special needs 0  - Assessment of Community assistance (transportation, D/C planning, etc.) 0  - Additional assistance / Altered mentation 0  - Support SurfaceHodgess) Assessment (bed, cushion, seat, etc.) 0 INTERVENTIONS - Wound Cleansing / Measurement X - Simple Wound Cleansing - one wound 1 5  - Complex Wound  Cleansing - multiple wounds 0 X - Wound Imaging (photographs - any number of wounds) 1 5  - Wound Tracing (instead of photographs) 0  - Simple Wound Measurement -  one wound 0 []  - Complex Wound Measurement - multiple wounds 0 INTERVENTIONS - Wound Dressings []  - Small Wound Dressing one or multiple wounds 0 []  - Medium Wound Dressing one or multiple wounds 0 []  - Large Wound Dressing one or multiple wounds 0 []  - Application of Medications - topical 0 []  - Application of Medications - injection 0 INTERVENTIONS - Miscellaneous []  - External ear exam 0 SORIANO, Jadiel ([id number]) []  - Specimen Collection (cultures, biopsies, blood, body fluids, etc.) 0 []  - SpecimenHodgess) / CultureHodgess) sent or taken to Lab for analysis 0 []  - Patient Transfer (multiple staff / Michiel Sites Lift / Similar devices) 0 []  - Simple Staple / Suture removal (25 or less) 0 []  - Complex Staple / Suture removal (26 or more) 0 []  - Hypo / Hyperglycemic Management (close monitor of Blood Glucose) 0 []  - Ankle / Brachial Index (ABI) - do not check if billed separately 0 X - Vital Signs 1 5 Has the patient been seen at the hospital within the last three years: Yes Total Score: 65 Level Of Care: New/Established - Level 2 Electronic SignatureHodgess) Signed: 07/28/2015 5:19:35 PM By: Alejandro Mulling Entered By: Alejandro Mulling on 07/26/2015 10:16:42 Gerald [id number]) -------------------------------------------------------------------------------- Encounter Discharge Information Details Patient Name: Gerald Hodges Date of Service: 07/26/2015 8:45 AM Medical Record Number: [id number] Patient Account Number: [id number] Date of Birth/Sex: 25-Oct-1935 (80 y.o. Male) Treating RN: Phillis Haggis Primary Care Physician: Gabriel Cirri Other Clinician: Referring Physician: Gabriel Cirri Treating Physician/Extender: Rudene Re in Treatment: 3 Encounter Discharge Information Items Discharge Pain Level:  0 Discharge Condition: Stable Ambulatory Status: Ambulatory Discharge Destination: Home Transportation: Private Auto family / Accompanied By: translator Schedule Follow-up Appointment: No Medication Reconciliation completed and provided to Patient/Care Yes Madeeha Costantino: Provided on Clinical Summary of Care: 07/26/2015 Form Type Recipient Paper Patient AS Electronic SignatureHodgess) Signed: 07/26/2015 9:13:49 AM By: Gwenlyn Perking Entered By: Gwenlyn Perking on 07/26/2015 09:13:49 Gerald [id number]) -------------------------------------------------------------------------------- Lower Extremity Assessment Details Patient Name: Gerald Hodges Date of Service: 07/26/2015 8:45 AM Medical Record Number: [id number] Patient Account Number: [id number] Date of Birth/Sex: Sep 23, 1935 (80 y.o. Male) Treating RN: Phillis Haggis Primary Care Physician: Gabriel Cirri Other Clinician: Referring Physician: Gabriel Cirri Treating Physician/Extender: Rudene Re in Treatment: 3 Vascular Assessment Pulses: Posterior Tibial Dorsalis Pedis Palpable: [Left:Yes] Extremity colors, hair growth, and conditions: Extremity Color: [Left:Mottled] Temperature of Extremity: [Left:Warm] Capillary Refill: [Left:> 3 seconds] Electronic SignatureHodgess) Signed: 07/28/2015 5:19:35 PM By: Alejandro Mulling Entered By: Alejandro Mulling on 07/26/2015 09:02:56 Gerald [id number]) -------------------------------------------------------------------------------- Multi Wound Chart Details Patient Name: Gerald Hodges Date of Service: 07/26/2015 8:45 AM Medical Record Number: [id number] Patient Account Number: [id number] Date of Birth/Sex: [birth year] (80 y.o. Male) Treating RN: Phillis Haggis Primary Care Physician: Gabriel Cirri Other Clinician: Referring Physician: Gabriel Cirri Treating Physician/Extender: Rudene Re in Treatment: 3 Vital Signs HeightHodgesin): 64 PulseHodgesbpm):  59 WeightHodgeslbs): 190 Blood Pressure 143/72 (mmHg): Body Mass IndexHodgesBMI): 33 TemperatureHodgesF): Respiratory Rate 18 (breaths/min): Photos: [1:No Photos] [N/A:N/A] Wound Location: [1:Left Lower Leg] [N/A:N/A] Wounding Event: [1:Trauma] [N/A:N/A] Primary Etiology: [1:Trauma, Other] [N/A:N/A] Date Acquired: [1:06/14/2015] [N/A:N/A] Weeks of Treatment: [1:3] [N/A:N/A] Wound Status: [1:Healed - Epithelialized] [N/A:N/A] Measurements L x W x D 0x0x0 [N/A:N/A] (cm) Area (cm) : [1:0] [N/A:N/A] Volume (cm) : [1:0] [N/A:N/A] % Reduction in Area: [1:100.00%] [N/A:N/A] % Reduction in Volume: 100.00% [N/A:N/A] Classification: [1:Partial Thickness] [N/A:N/A] Periwound Skin Texture: No Abnormalities Noted [N/A:N/A] Periwound Skin [1:No Abnormalities Noted] [N/A:N/A] Moisture: Periwound Skin Color: No Abnormalities Noted [N/A:N/A] Tenderness on [1:No] [  N/A:N/A] Treatment Notes Electronic SignatureHodgess) Signed: 07/28/2015 5:19:35 PM By: Alejandro Mulling Entered By: Alejandro Mulling on 07/26/2015 09:08:03 Gerald [id number]) -------------------------------------------------------------------------------- Multi-Disciplinary Care Plan Details Patient Name: Gerald Hodges Date of Service: 07/26/2015 8:45 AM Medical Record Number: [id number] Patient Account Number: [id number] Date of Birth/Sex: 06-06-1935 (79 y.o. Male) Treating RN: Phillis Haggis Primary Care Physician: Gabriel Cirri Other Clinician: Referring Physician: Gabriel Cirri Treating Physician/Extender: Rudene Re in Treatment: 3 Active Inactive Electronic SignatureHodgess) Signed: 07/28/2015 5:19:35 PM By: Alejandro Mulling Entered By: Alejandro Mulling on 07/27/2015 11:55:22 Gerald [id number]) -------------------------------------------------------------------------------- Pain Assessment Details Patient Name: Gerald Hodges Date of Service: 07/26/2015 8:45 AM Medical Record Number: [id number] Patient  Account Number: [id number] Date of Birth/Sex: 17-Mar-1935 (80 y.o. Male) Treating RN: Phillis Haggis Primary Care Physician: Gabriel Cirri Other Clinician: Referring Physician: Gabriel Cirri Treating Physician/Extender: Rudene Re in Treatment: 3 Active Problems Location of Pain Severity and Description of Pain Patient Has Paino No Site Locations Pain Management and Medication Current Pain Management: Electronic SignatureHodgess) Signed: 07/28/2015 5:19:35 PM By: Alejandro Mulling Entered By: Alejandro Mulling on 07/26/2015 08:58:46 Gerald [id number]) -------------------------------------------------------------------------------- Patient/Caregiver Education Details Patient Name: Gerald Hodges Date of Service: 07/26/2015 8:45 AM Medical Record Number: [id number] Patient Account Number: [id number] Date of Birth/Gender: 03/25/1935 (80 y.o. Male) Treating RN: Phillis Haggis Primary Care Physician: Gabriel Cirri Other Clinician: Referring Physician: Gabriel Cirri Treating Physician/Extender: Rudene Re in Treatment: 3 Education Assessment Education Provided To: Patient Education Topics Provided Wound/Skin Impairment: Other: Keep area / new skin protected with band-aide for about 10 days. Call us if you need Handouts: anything or hav Methods: Demonstration, Explain/Verbal Responses: State content correctly Electronic SignatureHodgess) Signed: 07/28/2015 5:19:35 PM By: Alejandro Mulling Entered By: Alejandro Mulling on 07/26/2015 09:10:00 Gerald [id number]) -------------------------------------------------------------------------------- Wound Assessment Details Patient Name: Gerald Hodges Date of Service: 07/26/2015 8:45 AM Medical Record Number: [id number] Patient Account Number: [id number] Date of Birth/Sex: [birth year] (80 y.o. Male) Treating RN: Phillis Haggis Primary Care Physician: Gabriel Cirri Other Clinician: Referring Physician:  Gabriel Cirri Treating Physician/Extender: Rudene Re in Treatment: 3 Wound Status Wound Number: 1 Primary Etiology: Trauma, Other Wound Location: Left Lower Leg Wound Status: Healed - Epithelialized Wounding Event: Trauma Date Acquired: 06/14/2015 Weeks Of Treatment: 3 Clustered Wound: No Photos Photo Uploaded By: Alejandro Mulling on 07/27/2015 07:57:26 Wound Measurements Length: (cm) 0 % Reduction i Width: (cm) 0 % Reduction i Depth: (cm) 0 Area: (cm) 0 Volume: (cm) 0 n Area: 100% n Volume: 100% Wound Description Classification: Partial Thickness Periwound Skin Texture Texture Color No Abnormalities Noted: No No Abnormalities Noted: No Moisture No Abnormalities Noted: No Electronic SignatureHodgess) Signed: 07/28/2015 5:19:35 PM By: Clarice Pole, Sadik ([id number]) Entered By: Alejandro Mulling on 07/26/2015 09:07:51 Gerald [id number]) -------------------------------------------------------------------------------- Vitals Details Patient Name: Gerald Hodges Date of Service: 07/26/2015 8:45 AM Medical Record Number: [id number] Patient Account Number: [id number] Date of Birth/Sex: 06/24/1935 (80 y.o. Male) Treating RN: Phillis Haggis Primary Care Physician: Gabriel Cirri Other Clinician: Referring Physician: Gabriel Cirri Treating Physician/Extender: Rudene Re in Treatment: 3 Vital Signs Time Taken: 09:00 Pulse (bpm): 59 Height (in): 64 Respiratory Rate (breaths/min): 18 Weight (lbs): 190 Blood Pressure (mmHg): 143/72 Body Mass Index (BMI): 32.6 Reference Range: 80 - 120 mg / dl Electronic SignatureHodgess) Signed: 07/28/2015 5:19:35 PM By: Alejandro Mulling Entered By: Alejandro Mulling on 07/26/2015 09:01:15

## 2015-07-29 NOTE — Progress Notes (Signed)
JAHLIL, ZILLER ([id number]) Visit Report for 07/26/2015 Chief Complaint Document Details Patient Name: Gerald Hodges, Gerald Hodges Date of Service: 07/26/2015 8:45 AM Medical Record Number: [id number] Patient Account Number: [id number] Date of Birth/Sex: 1935/09/22 (80 y.o. Male) Treating RN: Phillis Haggis Primary Care Physician: Gabriel Cirri Other Clinician: Referring Physician: Gabriel Cirri Treating Physician/Extender: Rudene Re in Treatment: 3 Information Obtained from: Patient Chief Complaint Patient presents to the wound care center for a consult due non healing wound to the left lower extremity which he sustained about 3 weeks ago. The patient speaks Spanish and is seen with an Print production planner) Signed: 07/26/2015 9:09:50 AM By: Evlyn Kanner MD, FACS Entered By: Evlyn Kanner on 07/26/2015 09:09:50 Gerald Hodges ([id number]) -------------------------------------------------------------------------------- HPI Details Patient Name: Gerald Hodges Date of Service: 07/26/2015 8:45 AM Medical Record Number: [id number] Patient Account Number: [id number] Date of Birth/Sex: 1935-05-16 (80 y.o. Male) Treating RN: Phillis Haggis Primary Care Physician: Gabriel Cirri Other Clinician: Referring Physician: Gabriel Cirri Treating Physician/Extender: Rudene Re in Treatment: 3 History of Present Illness Location: left lower extremity Quality: Patient reports experiencing a dull pain to affected area(s). Severity: Patient states wound are getting worse. Duration: Patient has had the wound for < 3 weeks prior to presenting for treatment Timing: Pain in wound is Intermittent (comes and goes Context: The wound occurred when the patient injured himself with a hammer which caused a wound on the left lower extremity Modifying Factors: Other treatment(s) tried include:hydrogen peroxide and some home remedies Associated Signs and Symptoms: Patient reports  having increase swelling. HPI Description: 80 year old Hispanic gentleman who is seen with the interpreter for a injury to the left lower extremity sustained about 3 weeks ago with a gamma which tore his skin open. He has been using some home remedies and hydrogen peroxide to clean this wound and it has gotten rather dry with no signs of infection. Has a past medical history of hypertension and does not have any other issues. the patient has a past medical history of hyperlipidemia and hypertension but has not been taking medications for a long while. he is also known to have chronic kidney disease stage III and had stable angina. 07/12/2015 -- he has received a Santyl ointment has been using it for the last couple of days. No other issues. Electronic Signature(s) Signed: 07/26/2015 9:09:54 AM By: Evlyn Kanner MD, FACS Entered By: Evlyn Kanner on 07/26/2015 09:09:53 Gerald Hodges ([id number]) -------------------------------------------------------------------------------- Physical Exam Details Patient Name: Gerald Hodges Date of Service: 07/26/2015 8:45 AM Medical Record Number: [id number] Patient Account Number: [id number] Date of Birth/Sex: [birth year] (80 y.o. Male) Treating RN: Phillis Haggis Primary Care Physician: Gabriel Cirri Other Clinician: Referring Physician: Gabriel Cirri Treating Physician/Extender: Rudene Re in Treatment: 3 Constitutional . Pulse regular. Respirations normal and unlabored. Afebrile. . Eyes Nonicteric. Reactive to light. Ears, Nose, Mouth, and Throat Lips, teeth, and gums WNL.Marland Kitchen Moist mucosa without lesions. Neck supple and nontender. No palpable supraclavicular or cervical adenopathy. Normal sized without goiter. Respiratory WNL. No retractions.. Cardiovascular Pedal Pulses WNL. No clubbing, cyanosis or edema. Lymphatic No adneopathy. No adenopathy. No adenopathy. Musculoskeletal Adexa without tenderness or enlargement.. Digits  and nails w/o clubbing, cyanosis, infection, petechiae, ischemia, or inflammatory conditions.. Integumentary (Hair, Skin) No suspicious lesions. No crepitus or fluctuance. No peri-wound warmth or erythema. No masses.Marland Kitchen Psychiatric Judgement and insight Intact.. No evidence of depression, anxiety, or agitation.. Notes wound is completely healed and there are no open ulcerations Electronic Signature(s) Signed: 07/26/2015 9:10:16 AM By: Evlyn Kanner MD, FACS Entered By:  Evlyn Kanner on 07/26/2015 09:10:16 Gerald Hodges, Gerald Hodges ([id number]) -------------------------------------------------------------------------------- Physician Orders Details Patient Name: Gerald Hodges Date of Service: 07/26/2015 8:45 AM Medical Record Number: [id number] Patient Account Number: [id number] Date of Birth/Sex: June 07, 1935 (80 y.o. Male) Treating RN: Phillis Haggis Primary Care Physician: Gabriel Cirri Other Clinician: Referring Physician: Gabriel Cirri Treating Physician/Extender: Rudene Re in Treatment: 3 Verbal / Phone Orders: Yes Clinician: Ashok Cordia, Debi Read Back and Verified: Yes Diagnosis Coding Discharge From [facility] Services o Discharge from Wound Care Center - Keep area / new skin protected with band-aide for about 10 days. Call us if you need anything or have any questions. Electronic Signature(s) Signed: 07/26/2015 3:35:28 PM By: Evlyn Kanner MD, FACS Signed: 07/28/2015 5:19:35 PM By: Alejandro Mulling Entered By: Alejandro Mulling on 07/26/2015 09:09:18 Gerald Hodges ([id number]) -------------------------------------------------------------------------------- Problem List Details Patient Name: Gerald Hodges Date of Service: 07/26/2015 8:45 AM Medical Record Number: [id number] Patient Account Number: [id number] Date of Birth/Sex: 1935/07/31 (80 y.o. 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 Male) Treating RN: Phillis Haggis Primary Care Physician: Gabriel Cirri Other Clinician: Referring Physician: Gabriel Cirri Treating Physician/Extender: Rudene Re in Treatment: 3 Subjective Chief Complaint Information obtained from Patient Patient presents to the wound care center for a consult due non healing wound to the left lower extremity which he sustained about 3 weeks ago. The patient speaks Spanish and is seen with an interpreter History of Present Illness (HPI) The following HPI elements were documented for the patient's wound: Location: left lower extremity Quality: Patient reports experiencing a dull pain to affected area(s). Severity: Patient states wound are getting worse. Duration: Patient has had the wound for < 3 weeks prior to presenting for treatment Timing: Pain in wound is Intermittent (comes and goes Context: The wound occurred when the patient injured himself with a hammer which caused a wound on the left lower extremity Modifying Factors: Other treatment(s) tried include:hydrogen peroxide and some home remedies Associated Signs and Symptoms: Patient reports  having increase swelling. 80 year old Hispanic gentleman who is seen with the interpreter for a injury to the left lower extremity sustained about 3 weeks ago with a gamma which tore his skin open. He has been using some home remedies and hydrogen peroxide to clean this wound and it has gotten rather dry with no signs of infection. Has a past medical history of hypertension and does not have any other issues. the patient has a past medical history of hyperlipidemia and hypertension but has not been taking medications for a long while. he is also known to have chronic kidney disease stage III and had stable angina. 07/12/2015 -- he has received a Santyl ointment has been using it for the last couple of days. No other issues. Objective Constitutional Pulse regular. Respirations normal and unlabored. Afebrile. Vitals Time Taken: 9:00 AM, Height: 64 in, Weight: 190 lbs, BMI: 32.6, Pulse: 59 bpm, Respiratory Rate: 18 Gerald Hodges,  Gerald Hodges ([id number]) breaths/min, Blood Pressure: 143/72 mmHg. Eyes Nonicteric. Reactive to light. Ears, Nose, Mouth, and Throat Lips, teeth, and gums WNL.Marland Kitchen Moist mucosa without lesions. Neck supple and nontender. No palpable supraclavicular or cervical adenopathy. Normal sized without goiter. Respiratory WNL. No retractions.. Cardiovascular Pedal Pulses WNL. No clubbing, cyanosis or edema. Lymphatic No adneopathy. No adenopathy. No adenopathy. Musculoskeletal Adexa without tenderness or enlargement.. Digits and nails w/o clubbing, cyanosis, infection, petechiae, ischemia, or inflammatory conditions.Marland Kitchen Psychiatric Judgement and insight Intact.. No evidence of depression, anxiety, or agitation.. General Notes: wound is completely healed and there are no open ulcerations Integumentary (Hair, Skin) No suspicious lesions. No crepitus or fluctuance. No peri-wound warmth or erythema. No masses.. Wound #1 status is Healed - Epithelialized. Original cause of wound was  Trauma. The wound is located on the Left Lower Leg. The wound measures 0cm length x 0cm width x 0cm depth; 0cm^2 area and 0cm^3 volume. Assessment Active Problems ICD-10 L97.222 - Non-pressure chronic ulcer of left calf with fat layer exposed S81.812A - Laceration without foreign body, left lower leg, initial encounter Gerald Hodges, Gerald Hodges ([id number]) The wound is healed and I have asked them to protect this with a large Band-Aid for about 10 days. He is discharged on the wound care services and will be seen back as needed. An interpreter was there in the room to help with the translation Plan Discharge From [facility] Services: Discharge from Wound Care Center - Keep area / new skin protected with band-aide for about 10 days. Call us if you need anything or have any questions. The wound is healed and I have asked them to protect this with a large Band-Aid for about 10 days. He is discharged on the wound care services and will be seen back as needed. An interpreter was there in the room to help with the translation Electronic Signature(s) Signed: 07/26/2015 9:10:55 AM By: Evlyn Kanner MD, FACS Entered By: Evlyn Kanner on 07/26/2015 09:10:54 Gerald Hodges ([id number]) -------------------------------------------------------------------------------- SuperBill Details Patient Name: Gerald Hodges Date of Service: 07/26/2015 Medical Record Number: [id number] Patient Account Number: [id number] Date of Birth/Sex: 02-12-1936 (80 y.o. Male) Treating RN: Phillis Haggis Primary Care Physician: Gabriel Cirri Other Clinician: Referring Physician: Gabriel Cirri Treating Physician/Extender: Rudene Re in Treatment: 3 Diagnosis Coding ICD-10 Codes Code Description [id number] Non-pressure chronic ulcer of left calf with fat layer exposed S81.812A Laceration without foreign body, left lower leg, initial encounter Facility Procedures CPT4 Code: [id number] Description: 75300 - WOUND CARE  VISIT-LEV 2 EST PT Modifier: Quantity: 1 Physician Procedures CPT4 Code Description: 5110211 17356 - WC PHYS LEVEL 2 - EST PT ICD-10 Description Diagnosis L97.222 Non-pressure chronic ulcer of left calf with fat lay S81.812A Laceration without foreign body, left lower leg, ini Modifier: er exposed tial encounter Quantity: 1 Electronic Signature(s) Signed: 07/26/2015 3:35:28 PM By: Evlyn Kanner MD, FACS Signed: 07/28/2015 5:19:35 PM By: Alejandro Mulling Previous Signature: 07/26/2015 9:11:05 AM Version By: Evlyn Kanner MD, FACS Entered By: Alejandro Mulling on 07/26/2015 10:16:56

## 2015-08-04 ENCOUNTER — Ambulatory Visit (INDEPENDENT_AMBULATORY_CARE_PROVIDER_SITE_OTHER): Payer: Medicaid Other | Admitting: Unknown Physician Specialty

## 2015-08-04 ENCOUNTER — Encounter: Payer: Self-pay | Admitting: Unknown Physician Specialty

## 2015-08-04 VITALS — BP 174/102 | HR 67 | Temp 98.1°F | Ht 65.2 in | Wt 183.4 lb

## 2015-08-04 DIAGNOSIS — J309 Allergic rhinitis, unspecified: Secondary | ICD-10-CM | POA: Diagnosis not present

## 2015-08-04 DIAGNOSIS — R05 Cough: Secondary | ICD-10-CM

## 2015-08-04 DIAGNOSIS — N183 Chronic kidney disease, stage 3 unspecified: Secondary | ICD-10-CM

## 2015-08-04 DIAGNOSIS — R059 Cough, unspecified: Secondary | ICD-10-CM

## 2015-08-04 DIAGNOSIS — I10 Essential (primary) hypertension: Secondary | ICD-10-CM

## 2015-08-04 MED ORDER — FLUTICASONE PROPIONATE 50 MCG/ACT NA SUSP: 2.0000 | Freq: Every day | NASAL | Status: DC

## 2015-08-04 MED ORDER — BENZONATATE 100 MG PO CAPS: 100.0000 mg | ORAL_CAPSULE | Freq: Two times a day (BID) | ORAL | Status: DC | PRN

## 2015-08-04 MED ORDER — LOSARTAN POTASSIUM 50 MG PO TABS: 50.0000 mg | ORAL_TABLET | Freq: Every day | ORAL | Status: DC

## 2015-08-04 NOTE — Patient Instructions (Addendum)
Get chest x-ray at [address] [place], [place] [address] [facility]  Discontinue Lisinopril and start Losartan  For vaccines, go to [facility] or [address] in [place] or [employer]

## 2015-08-04 NOTE — Progress Notes (Signed)
BP 174/102 mmHg  Pulse 67  Temp(Src) 98.1 F (36.7 C)  Ht 5' 5.2" (1.656 m)  Wt 183 lb 6.4 oz (83.19 kg)  BMI 30.34 kg/m2  SpO2 96%   Subjective:    Patient ID: Gerald Hodges, male    DOB: 09-Dec-1935, 80 y.o.   MRN: [id number]  HPI: Gerald Hodges is a 80 y.o. male  Chief Complaint  Patient presents with  . Cough    pt states he has had a cough and some chest congestion for about 3 weeks now   Cough This is a new problem. The current episode started today. The problem has been gradually worsening. The problem occurs constantly. The cough is productive of sputum. Associated symptoms include headaches, nasal congestion and postnasal drip. Pertinent negatives include no chest pain, ear pain or fever. Nothing aggravates the symptoms. He has tried nothing for the symptoms. The treatment provided no relief.   Hypertension Pt originally told me he is taking no medications but after discussion with me and the interpreter and son who doesn't live with him, he says he is taking 4 medications but doesn't know which ones they are.    Relevant past medical, surgical, family and social history reviewed and updated as indicated. Interim medical history since our last visit reviewed. Allergies and medications reviewed and updated.  Review of Systems  Constitutional: Negative for fever.  HENT: Positive for postnasal drip. Negative for ear pain.   Respiratory: Positive for cough.   Cardiovascular: Negative for chest pain.  Neurological: Positive for headaches.    Per HPI unless specifically indicated above     Objective:    BP 174/102 mmHg  Pulse 67  Temp(Src) 98.1 F (36.7 C)  Ht 5' 5.2" (1.656 m)  Wt 183 lb 6.4 oz (83.19 kg)  BMI 30.34 kg/m2  SpO2 96%  Wt Readings from Last 3 Encounters:  08/04/15 183 lb 6.4 oz (83.19 kg)  06/28/15 190 lb (86.183 kg)  10/09/14 184 lb (83.462 kg)    Physical Exam  Constitutional: He is oriented to person, place, and time. He appears  well-developed and well-nourished. No distress.  HENT:  Head: Normocephalic and atraumatic.  Right Ear: Tympanic membrane and ear canal normal.  Left Ear: Tympanic membrane and ear canal normal.  Nose: Rhinorrhea present. Right sinus exhibits no maxillary sinus tenderness and no frontal sinus tenderness. Left sinus exhibits no maxillary sinus tenderness and no frontal sinus tenderness.  Mouth/Throat: Uvula is midline. Posterior oropharyngeal edema present.  Eyes: Conjunctivae and lids are normal. Right eye exhibits no discharge. Left eye exhibits no discharge. No scleral icterus.  Neck: Neck supple.  Cardiovascular: Normal rate, regular rhythm and normal heart sounds.   Pulmonary/Chest: Effort normal and breath sounds normal. No respiratory distress.  Abdominal: Normal appearance. There is no splenomegaly or hepatomegaly.  Musculoskeletal: Normal range of motion.  Neurological: He is alert and oriented to person, place, and time.  Skin: Skin is warm, dry and intact. No rash noted. No pallor.  Psychiatric: He has a normal mood and affect. His behavior is normal. Judgment and thought content normal.  Nursing note and vitals reviewed.   Results for orders placed or performed in visit on 06/28/15  Lipid Panel w/o Chol/HDL Ratio  Result Value Ref Range   Cholesterol, Total 109 100 - 199 mg/dL   Triglycerides 412 (H) 0 - 149 mg/dL   HDL 32 (L) >87 mg/dL   VLDL Cholesterol Cal 39 5 - 40 mg/dL   LDL  Calculated 38 0 - 99 mg/dL  Comprehensive metabolic panel  Result Value Ref Range   Glucose 92 65 - 99 mg/dL   BUN 23 8 - 27 mg/dL   Creatinine, Ser 1.61 (H) 0.76 - 1.27 mg/dL   GFR calc non Af Amer 46 (L) >59 mL/min/1.73   GFR calc Af Amer 53 (L) >59 mL/min/1.73   BUN/Creatinine Ratio 16 10 - 24   Sodium 142 134 - 144 mmol/L   Potassium 5.0 3.5 - 5.2 mmol/L   Chloride 103 96 - 106 mmol/L   CO2 24 18 - 29 mmol/L   Calcium 9.7 8.6 - 10.2 mg/dL   Total Protein 6.7 6.0 - 8.5 g/dL   Albumin  4.1 3.5 - 4.8 g/dL   Globulin, Total 2.6 1.5 - 4.5 g/dL   Albumin/Globulin Ratio 1.6 1.2 - 2.2   Bilirubin Total 0.7 0.0 - 1.2 mg/dL   Alkaline Phosphatase 81 39 - 117 IU/L   AST 26 0 - 40 IU/L   ALT 25 0 - 44 IU/L  CBC  Result Value Ref Range   WBC 5.5 3.4 - 10.8 x10E3/uL   RBC 4.76 4.14 - 5.80 x10E6/uL   Hemoglobin 14.5 12.6 - 17.7 g/dL   Hematocrit 09.6 04.5 - 51.0 %   MCV 90 79 - 97 fL   MCH 30.5 26.6 - 33.0 pg   MCHC 33.7 31.5 - 35.7 g/dL   RDW 40.9 81.1 - 91.4 %   Platelets 265 150 - 379 x10E3/uL      Assessment & Plan:   Problem List Items Addressed This Visit      Unprioritized   Allergic rhinitis   Essential hypertension, benign   Relevant Medications   losartan (COZAAR) 50 MG tablet    Other Visit Diagnoses    Cough    -  Primary    Relevant Orders    DG Chest 2 View    Chronic kidney disease, stage 3           Written instructions given  Follow up plan: Return in about 4 weeks (around 09/01/2015).

## 2015-08-13 ENCOUNTER — Ambulatory Visit
Admission: RE | Admit: 2015-08-13 | Discharge: 2015-08-13 | Disposition: A | Discharge status: Home / Self Care | Payer: Medicaid Other | Source: Ambulatory Visit | Attending: Unknown Physician Specialty | Admitting: Unknown Physician Specialty

## 2015-08-13 DIAGNOSIS — R059 Cough, unspecified: Secondary | ICD-10-CM

## 2015-08-13 DIAGNOSIS — R918 Other nonspecific abnormal finding of lung field: Secondary | ICD-10-CM | POA: Insufficient documentation

## 2015-08-13 DIAGNOSIS — R05 Cough: Secondary | ICD-10-CM | POA: Insufficient documentation

## 2015-09-03 ENCOUNTER — Ambulatory Visit: Payer: Medicaid Other | Admitting: Unknown Physician Specialty

## 2015-09-13 ENCOUNTER — Other Ambulatory Visit: Payer: Self-pay

## 2015-09-13 NOTE — Telephone Encounter (Signed)
Opened in error

## 2015-09-15 ENCOUNTER — Other Ambulatory Visit: Payer: Self-pay

## 2015-09-15 MED ORDER — NITROGLYCERIN 0.4 MG SL SUBL: 0.4000 mg | SUBLINGUAL_TABLET | SUBLINGUAL | Status: DC | PRN

## 2015-09-21 ENCOUNTER — Encounter: Payer: Self-pay | Admitting: Unknown Physician Specialty

## 2015-09-21 ENCOUNTER — Ambulatory Visit (INDEPENDENT_AMBULATORY_CARE_PROVIDER_SITE_OTHER): Payer: Medicaid Other | Admitting: Unknown Physician Specialty

## 2015-09-21 VITALS — BP 147/87 | HR 71 | Temp 98.3°F | Ht 65.5 in | Wt 186.4 lb

## 2015-09-21 DIAGNOSIS — N183 Chronic kidney disease, stage 3 unspecified: Secondary | ICD-10-CM

## 2015-09-21 DIAGNOSIS — M545 Low back pain, unspecified: Secondary | ICD-10-CM

## 2015-09-21 DIAGNOSIS — N189 Chronic kidney disease, unspecified: Secondary | ICD-10-CM

## 2015-09-21 DIAGNOSIS — I129 Hypertensive chronic kidney disease with stage 1 through stage 4 chronic kidney disease, or unspecified chronic kidney disease: Secondary | ICD-10-CM

## 2015-09-21 DIAGNOSIS — I2510 Atherosclerotic heart disease of native coronary artery without angina pectoris: Secondary | ICD-10-CM

## 2015-09-21 NOTE — Assessment & Plan Note (Signed)
Per Dr Klein °

## 2015-09-21 NOTE — Assessment & Plan Note (Addendum)
It seems that he see a nephrologist in [place].

## 2015-09-21 NOTE — Progress Notes (Signed)
BP 147/87 mmHg  Pulse 71  Temp(Src) 98.3 F (36.8 C)  Ht 5' 5.5" (1.664 m)  Wt 186 lb 6.4 oz (84.55 kg)  BMI 30.54 kg/m2  SpO2 98%   Subjective:    Patient ID: Gerald Hodges, male    DOB: March 16, 1935, 80 y.o.   MRN: [id number]  HPI: Gerald Hodges is a 80 y.o. male  Chief Complaint  Patient presents with  . Follow-up    pt states he has not been taking aspirin because it makes him cough, he also wants to talk about something for his kidneys, states his previous provider told him he had problems with his kidneys. He states he also has 2 blockages and wanted to let us know.    Hypertension Pt brought in medications today and seems to be taking them.  He does have questions about his past history of CKD.  ASA causes an allergy and a cough.   Average home BPs Not checking   No problems or lightheadedness He has the occasional chest pain and uses Nitroglycerin.   No Edema   Hyperlipidemia Using medications without problems.  Taking 40 mg Atorvastatin.   No Muscle aches  Diet compliance: Exercise:  CAD He is established with a cardiologist at [facility]. He is concerned that he can't take Aspirin.     Back pain Pt with complaints of low back pain a lot in the last 6 months.  States his wife gives him a pill which helps.    Relevant past medical, surgical, family and social history reviewed and updated as indicated. Interim medical history since our last visit reviewed. Allergies and medications reviewed and updated.  Review of Systems  Per HPI unless specifically indicated above     Objective:    BP 147/87 mmHg  Pulse 71  Temp(Src) 98.3 F (36.8 C)  Ht 5' 5.5" (1.664 m)  Wt 186 lb 6.4 oz (84.55 kg)  BMI 30.54 kg/m2  SpO2 98%  Wt Readings from Last 3 Encounters:  09/21/15 186 lb 6.4 oz (84.55 kg)  08/04/15 183 lb 6.4 oz (83.19 kg)  06/28/15 190 lb (86.183 kg)    Physical Exam  Constitutional: He is oriented to person, place, and time. He appears  well-developed and well-nourished. No distress.  HENT:  Head: Normocephalic and atraumatic.  Eyes: Conjunctivae and lids are normal. Right eye exhibits no discharge. Left eye exhibits no discharge. No scleral icterus.  Neck: Normal range of motion. Neck supple. No JVD present. Carotid bruit is not present.  Cardiovascular: Normal rate, regular rhythm and normal heart sounds.   Pulmonary/Chest: Effort normal and breath sounds normal. No respiratory distress.  Abdominal: Normal appearance. There is no splenomegaly or hepatomegaly.  Musculoskeletal: Normal range of motion.  Neurological: He is alert and oriented to person, place, and time.  Skin: Skin is warm, dry and intact. No rash noted. No pallor.  Psychiatric: He has a normal mood and affect. His behavior is normal. Judgment and thought content normal.    Results for orders placed or performed in visit on 06/28/15  Lipid Panel w/o Chol/HDL Ratio  Result Value Ref Range   Cholesterol, Total 109 100 - 199 mg/dL   Triglycerides 409 (H) 0 - 149 mg/dL   HDL 32 (L) >81 mg/dL   VLDL Cholesterol Cal 39 5 - 40 mg/dL   LDL Calculated 38 0 - 99 mg/dL  Comprehensive metabolic panel  Result Value Ref Range   Glucose 92 65 - 99 mg/dL  BUN 23 8 - 27 mg/dL   Creatinine, Ser 0.25 (H) 0.76 - 1.27 mg/dL   GFR calc non Af Amer 46 (L) >59 mL/min/1.73   GFR calc Af Amer 53 (L) >59 mL/min/1.73   BUN/Creatinine Ratio 16 10 - 24   Sodium 142 134 - 144 mmol/L   Potassium 5.0 3.5 - 5.2 mmol/L   Chloride 103 96 - 106 mmol/L   CO2 24 18 - 29 mmol/L   Calcium 9.7 8.6 - 10.2 mg/dL   Total Protein 6.7 6.0 - 8.5 g/dL   Albumin 4.1 3.5 - 4.8 g/dL   Globulin, Total 2.6 1.5 - 4.5 g/dL   Albumin/Globulin Ratio 1.6 1.2 - 2.2   Bilirubin Total 0.7 0.0 - 1.2 mg/dL   Alkaline Phosphatase 81 39 - 117 IU/L   AST 26 0 - 40 IU/L   ALT 25 0 - 44 IU/L  CBC  Result Value Ref Range   WBC 5.5 3.4 - 10.8 x10E3/uL   RBC 4.76 4.14 - 5.80 x10E6/uL   Hemoglobin 14.5 12.6  - 17.7 g/dL   Hematocrit 85.2 77.8 - 51.0 %   MCV 90 79 - 97 fL   MCH 30.5 26.6 - 33.0 pg   MCHC 33.7 31.5 - 35.7 g/dL   RDW 24.2 35.3 - 61.4 %   Platelets 265 150 - 379 x10E3/uL      Assessment & Plan:   Problem List Items Addressed This Visit      Unprioritized   Arteriosclerosis of coronary artery    Per Dr. Graciela Husbands      Relevant Medications   atorvastatin (LIPITOR) 40 MG tablet   Benign hypertension with chronic kidney disease - Primary    BP improved today      Relevant Medications   atorvastatin (LIPITOR) 40 MG tablet   Chronic kidney disease, stage III (moderate)    It seems that he see a nephrologist in [place].        Low back pain       Follow up plan: Return in about 6 months (around 03/23/2016).

## 2015-09-21 NOTE — Assessment & Plan Note (Signed)
BP improved today.  

## 2015-09-21 NOTE — Assessment & Plan Note (Signed)
Discussed Tylenol and activity

## 2015-09-21 NOTE — Patient Instructions (Signed)
Dolor de espalda en adultos (Back Pain, Adult) El dolor de espalda es muy frecuente en los adultos.La causa del dolor de espalda es rara vez peligrosa y Chief Technology Officer a menudo mejora con el [place].Es posible que se desconozca la causa de esta afeccin. Algunas causas comunes son las siguientes: 1. Distensin de los msculos o ligamentos que sostienen la columna vertebral. 2. Desgaste (degeneracin) de los discos vertebrales. 3. Artritis. 4. Lesiones directas en la espalda. En [employer], el dolor de espalda es recurrente. Como rara vez es peligroso, las personas pueden aprender a Psychologist, clinical afeccin por s mismas. INSTRUCCIONES PARA EL CUIDADO EN EL HOGAR Controle su dolor de espalda a fin de Public house manager cambio. Las siguientes indicaciones ayudarn a Paramedic cualquier molestia que pueda sentir: 1. Permanezca activo. Si permanece sentado o de pie en un mismo lugar durante mucho tiempo, se tensiona la espalda. No se siente, conduzca o permanezca de pie en un mismo lugar durante ms de 30 minutos seguidos. Realice caminatas cortas en superficies planas tan pronto como le sea posible.Trate de caminar un poco ms de Pharmacist, community. 2. Haga ejercicio regularmente como se lo haya indicado el mdico. El ejercicio ayuda a que su espalda se cure ms rpidamente. Tambin ayuda a prevenir futuras lesiones al [employer] fuertes y flexibles. 3. No permanezca en la cama.Si hace reposo ms de 1 a 2 das, puede demorar su recuperacin. 4. Preste atencin a su cuerpo al inclinarse y levantarse. Las posiciones ms cmodas son las que ejercen menos tensin en la espalda en recuperacin. Siempre use tcnicas apropiadas para levantar objetos, como por ejemplo: 1. Flexionar las rodillas. 2. Mantener la carga cerca del cuerpo. 3. No torcerse. 5. Encuentre una posicin cmoda para dormir. Use un colchn firme y recustese de costado con las rodillas ligeramente flexionadas. Si se recuesta [employer], coloque una almohada debajo de las rodillas. 6. Evite sentir ansiedad o estrs.El estrs aumenta la tensin muscular y puede empeorar el dolor de espalda.Es importante reconocer si se siente ansioso o estresado y aprender maneras de controlarlo, por ejemplo haciendo ejercicio. 7. [employer] solamente como se lo haya indicado el mdico. Los medicamentos de venta libre para Engineer, materials y la inflamacin a menudo son los ms eficaces.El mdico puede recetarle relajantes musculares.Estos medicamentos ayudan a Primary school teacher de modo que pueda reanudar ms rpidamente sus actividades normales y el ejercicio saludable. 8. Aplique hielo sobre la zona lesionada. 1. Ponga el hielo en una bolsa plstica. 2. Coloque una toalla entre la piel y la bolsa de hielo. 3. Deje el hielo durante , 2 a 3veces por da, durante los primeros 2 o 3das. Despus de eso, puede alternar el hielo y el calor para reducir Chief Technology Officer y los espasmos. 9. Mantenga un peso saludable. El exceso de peso ejerce presin adicional sobre la espalda y hace que resulte difcil mantener una buena [place]. SOLICITE ATENCIN MDICA SI: 1. Siente un dolor que no se alivia con reposo o medicamentos. 2. Siente mucho dolor que se extiende a las piernas o los glteos. 3. El dolor no mejora en [employer]. 4. Siente dolor por la noche. 5. Pierde peso. 6. Siente escalofros o fiebre. SOLICITE ATENCIN MDICA DE INMEDIATO SI:  1. Tiene nuevos problemas para controlar la vejiga o los intestinos. 2. Siente debilidad o adormecimiento inusuales en los brazos o en las piernas. 3. Siente nuseas o vmitos. 4. Siente dolor abdominal. 5. Siente que va a desmayarse.  Esta informacin no tiene Theme park manager el consejo del mdico. Asegrese de hacerle al mdico cualquier pregunta que tenga.   Document Released: 02/27/2005 Document Revised: 03/20/2014 Elsevier Interactive Patient Education 2016 [employer]. Ejercicios para la espalda (Back Exercises) Los siguientes ejercicios fortalecen los msculos que dan soporte a la espalda y, [place], ayudan a Pharmacologist la flexibilidad de la zona lumbar. Hacer estos ejercicios puede ser de ayuda para Psychologist, sport and exercise de espalda o Engineer, materials actual. Si tiene dolor o molestias en la espalda, intente hacer estos ejercicios 2 o 3veces por da, o como se lo haya indicado el mdico. Cuando el dolor desaparezca, hgalos una vez por da, pero haga ms repeticiones de cada ejercicio. Si no tiene dolor o [employer] espalda, haga estos ejercicios una vez por da o como se lo haya indicado el mdico. EJERCICIOS Rodilla al pecho Repita estos pasos 3 o 5veces con cada pierna: 5. Acustese boca arriba sobre una cama dura o sobre el suelo con las piernas extendidas. 6. Lleve una rodilla al pecho. La otra pierna debe quedar extendida y en contacto con el suelo. 7. Mantenga la rodilla contra el pecho. Para lograrlo tmese la rodilla o el muslo. 8. Tire de la rodilla hasta sentir una elongacin suave en la parte baja de la espalda. 9. Mantenga la elongacin durante 10 a 30segundos. 10. Suelte y extienda la pierna lentamente. Inclinacin de la pelvis Repita estos pasos 5 o 10veces: 10. Acustese boca arriba sobre una cama dura o sobre el suelo con las piernas extendidas. 11. Flexione las rodillas de modo que apunten al techo y los pies queden apoyados en el suelo. 12. Contraiga los msculos de la parte baja del abdomen para empujar la zona lumbar contra el suelo. Con este movimiento se inclinar la pelvis de modo que el cccix apunte hacia el techo, en lugar de apuntar en direccin a los pies o al suelo. 13. Contraiga suavemente y respire con normalidad mientras mantiene esta posicin durante 5 a 10segundos. El perro y el gato Repita estos pasos hasta que la zona lumbar se vuelva ms flexible: 7. Apoye las palmas de las manos y las rodillas sobre una superficie  firme. Las manos deben estar alineadas con los hombros y las rodillas con las caderas. Puede colocarse almohadillas debajo de las rodillas para estar cmodo. 8. Deje caer la cabeza y baje el cccix en direccin al suelo de modo que la zona lumbar se arquee como el lomo de un gato [place]. 9. Mantenga esta posicin durante 5segundos. 10. Lentamente, levante la cabeza y eleve el cccix de modo que apunte en direccin al techo para que la espalda forme un arco hundido como el lomo de un perro contento. 11. Mantenga esta posicin durante 5segundos. Flexiones de Public house manager pasos 5 o 10veces: 6. Acustese boca abajo en el suelo. 7. Coloque las palmas de las manos cerca de la cabeza, separadas aproximadamente al ancho de los hombros. 8. Con la espalda lo ms relajada posible y las caderas apoyadas en el suelo, extienda lentamente los brazos para levantar la mitad superior del cuerpo y Optometrist los hombros. No use los msculos de la espalda para elevar la parte superior del torso. Puede cambiar las manos de lugar para estar ms cmodo. 9. Mantenga esta posicin durante 5segundos mientras mantiene la espalda relajada. 10. Lentamente vuelva a la posicin horizontal. Puentes Repita estos pasos 10veces: 1. Acustese boca arriba sobre una superficie firme. 2. Flexione las rodillas de [place]  que apunten al techo y los pies queden apoyados en el suelo. 3. Contraiga los glteos y despegue las nalgas del suelo hasta que la cintura est casi a la misma altura que las rodillas. Debe sentir el trabajo muscular en las nalgas y la parte posterior de los muslos. Si no siente el esfuerzo de [employer], aleje los pies 1 o 2pulgadas (2,5 o 5centmetros) de las nalgas. 4. Mantenga esta posicin durante 3 a 5segundos. 5. Baje lentamente las caderas a la posicin inicial y relaje los glteos por completo. Si este ejercicio le resulta muy fcil, intente realizarlo con los brazos cruzados [place]. Abdominales Repita estos pasos 5 o 10veces: 1. Acustese boca arriba sobre una cama dura o sobre el suelo con las piernas extendidas. 2. Flexione las rodillas de modo que apunten al techo y los pies queden apoyados en el suelo. 3. Cruce los [employer]. 4. Baje levemente el mentn en direccin al pecho sin doblar el cuello. 5. Contraiga los msculos del abdomen y con lentitud eleve el torso lo suficiente como para Artist los omplatos del suelo. No eleve el torso ms alto que eso, porque esto puede sobreexigir a la zona lumbar y no ayuda a Investment banker, operational. 6. Regrese lentamente a la posicin inicial. Elevaciones de espalda Repita estos pasos 5 o 10veces: 1. Acustese boca abajo con los brazos a los costados del cuerpo y apoye la frente en el suelo. 2. Contraiga los msculos de las piernas y los glteos. 3. Lentamente despegue el pecho del suelo [employer] las caderas bien apoyadas en el suelo. Mantenga la nuca alineada con la curvatura de la espalda. Los ojos deben mirar al suelo. 4. Mantenga esta posicin durante 3 a 5segundos. 5. Regrese lentamente a la posicin inicial. SOLICITE ATENCIN MDICA SI:  El dolor o las molestias en la espalda se vuelven mucho ms intensos cuando hace un ejercicio.  El dolor o las molestias en la espalda no se Copy en el trmino de las 2horas posteriores a Copy. Si tiene alguno de [employer], deje de [employer] ejercicios de inmediato. No vuelva a hacer los ejercicios a menos que el mdico lo autorice. SOLICITE ATENCIN MDICA DE INMEDIATO SI:  Siente un dolor sbito e intenso en la espalda. Si esto ocurre, deje de [employer] ejercicios de inmediato. No vuelva a hacer los ejercicios a menos que el mdico lo autorice.   Esta informacin no tiene Theme park manager el consejo del mdico. Asegrese de hacerle al mdico cualquier pregunta que tenga.   Document Released: 02/27/2005  Document Revised: 11/18/2014 Elsevier Interactive Patient Education [employer]

## 2015-09-24 ENCOUNTER — Encounter: Payer: Self-pay | Admitting: Sports Medicine

## 2015-09-24 ENCOUNTER — Ambulatory Visit (INDEPENDENT_AMBULATORY_CARE_PROVIDER_SITE_OTHER): Payer: Medicaid Other | Admitting: Sports Medicine

## 2015-09-24 DIAGNOSIS — M216X9 Other acquired deformities of unspecified foot: Secondary | ICD-10-CM | POA: Diagnosis not present

## 2015-09-24 DIAGNOSIS — M779 Enthesopathy, unspecified: Secondary | ICD-10-CM

## 2015-09-24 DIAGNOSIS — M7750 Other enthesopathy of unspecified foot: Secondary | ICD-10-CM

## 2015-09-24 DIAGNOSIS — M21619 Bunion of unspecified foot: Secondary | ICD-10-CM

## 2015-09-24 DIAGNOSIS — M79673 Pain in unspecified foot: Secondary | ICD-10-CM

## 2015-09-24 DIAGNOSIS — M19079 Primary osteoarthritis, unspecified ankle and foot: Secondary | ICD-10-CM

## 2015-09-24 DIAGNOSIS — M778 Other enthesopathies, not elsewhere classified: Secondary | ICD-10-CM

## 2015-09-24 MED ORDER — TRIAMCINOLONE ACETONIDE 10 MG/ML IJ SUSP: 10.0000 mg | Freq: Once | INTRAMUSCULAR | Status: DC

## 2015-09-24 NOTE — Progress Notes (Addendum)
Patient ID: Gerald Hodges, male   DOB: Sep 07, 1935, 80 y.o.   MRN: [id number]  Subjective: Gerald Hodges is a 80 y.o. male patient who returns to office for evaluation of Left>Right bunion pain. Patient is s/p injection to both bunions and reports injections helped for about 3 weeks. Patient denies any other pedal complaints.   Patient is assisted by relative and a translator during this visit.   Patient Active Problem List   Diagnosis Date Noted  . Low back pain 09/21/2015  . Allergic rhinitis 08/04/2015  . Stable angina ([place]) 06/28/2015  . Bunion of unspecified foot 06/28/2015  . Benign hypertension with chronic kidney disease 10/09/2014  . Gout 10/09/2014  . Essential hypertension, benign 08/18/2014  . Psoriasis 08/18/2014  . Chronic kidney disease, stage III (moderate) 08/18/2014  . Hyperlipemia 08/18/2014  . CFIDS (chronic fatigue and immune dysfunction syndrome) 02/17/2014  . APC (atrial premature contractions) 02/17/2014  . Chronic kidney disease 11/18/2013  . Acid reflux 03/19/2013  . Arteriosclerosis of coronary artery 03/18/2013  . Angina pectoris ([place]) 10/09/2012  . Breath shortness 10/09/2012  . Chest pain 10/06/2012  . Creatinine elevation 10/06/2012  . HLD (hyperlipidemia) 10/06/2012    Current Outpatient Prescriptions on File Prior to Visit  Medication Sig Dispense Refill  . allopurinol (ZYLOPRIM) 100 MG tablet Take 1 tablet (100 mg total) by mouth daily. 90 tablet 1  . amLODipine (NORVASC) 10 MG tablet Take 1 tablet (10 mg total) by mouth daily. 90 tablet 1  . aspirin 81 MG tablet Take 81 mg by mouth daily. Reported on 09/21/2015    . atorvastatin (LIPITOR) 40 MG tablet Take 40 mg by mouth daily.    . carvedilol (COREG) 6.25 MG tablet Take 1 tablet (6.25 mg total) by mouth 2 (two) times daily with a meal. 180 tablet 1  . colchicine 0.6 MG tablet Take 1 tablet (0.6 mg total) by mouth daily. 30 tablet 2  . fluticasone (FLONASE) 50 MCG/ACT nasal spray Place 2  sprays into both nostrils daily. 16 g 6  . furosemide (LASIX) 40 MG tablet Take 1 tablet (40 mg total) by mouth 2 (two) times daily. Reported on 06/28/2015 90 tablet 1  . isosorbide mononitrate (IMDUR) 60 MG 24 hr tablet Take 1 tablet (60 mg total) by mouth daily. 90 tablet 1  . losartan (COZAAR) 50 MG tablet Take 1 tablet (50 mg total) by mouth daily. 30 tablet 1  . nitroGLYCERIN (NITROSTAT) 0.4 MG SL tablet Place 1 tablet (0.4 mg total) under the tongue every 5 (five) minutes as needed for chest pain (chest pain). 20 tablet 1  . omeprazole (PRILOSEC) 20 MG capsule Take 1 capsule (20 mg total) by mouth daily. 90 capsule 3   Current Facility-Administered Medications on File Prior to Visit  Medication Dose Route Frequency Provider Last Rate Last Dose  . triamcinolone acetonide (KENALOG) 10 MG/ML injection 10 mg  10 mg Other Once Landis Martins, DPM        Allergies  Allergen Reactions  . Aspirin Cough  . Lisinopril Other (See Comments)    Throat itching    Objective:  General: Alert and oriented x3 in no acute distress  Dermatology: No open lesions bilateral lower extremities, no webspace macerations, no ecchymosis bilateral, all nails x 10 are well manicured.  Vascular: Dorsalis Pedis and Posterior Tibial pedal pulses 1/4, Capillary Fill Time 3 seconds, (+) scant pedal hair growth bilateral, no edema bilateral lower extremities, Temperature gradient within normal limits.  Neurology: Gross sensation intact  via light touch bilateral, Protective sensation intact  with Semmes Weinstein Monofilament to all pedal sites, Position sense intact, vibratory intact bilateral, Deep tendon reflexes within normal limits bilateral, No babinski sign present bilateral. (-) Tinels sign bilateral.   Musculoskeletal: Mild tenderness with palpation left>right bunion deformity, no crepitus with range of motion, deformity not reducible, trackbound on left and tracking on right, there is mild 1st ray  hypermobility noted bilateral. Midtarsal, Subtalar joint, and ankle joint range of motion is within normal limits.  There is medial arch collapse on weightbearing, HAV deformity supported on ground with second toe crossover deformity noted Left>Right.   Assessment and Plan: Problem List Items Addressed This Visit    None    Visit Diagnoses    Bunion    -  Primary   Relevant Medications   triamcinolone acetonide (KENALOG) 10 MG/ML injection 10 mg   Other Relevant Orders   Uric acid (Completed)   CBC with Differential (Completed)   Rheumatoid factor (Completed)   HLA-B27 Antigen (Completed)   Sedimentation Rate (Completed)   C-reactive protein (Completed)   Basic Metabolic Panel (Completed)   Capsulitis of foot, unspecified laterality       Relevant Medications   triamcinolone acetonide (KENALOG) 10 MG/ML injection 10 mg   Other Relevant Orders   Uric acid (Completed)   CBC with Differential (Completed)   Rheumatoid factor (Completed)   HLA-B27 Antigen (Completed)   Sedimentation Rate (Completed)   C-reactive protein (Completed)   Basic Metabolic Panel (Completed)   Foot pain, unspecified laterality       Relevant Medications   triamcinolone acetonide (KENALOG) 10 MG/ML injection 10 mg   Other Relevant Orders   Uric acid (Completed)   CBC with Differential (Completed)   Rheumatoid factor (Completed)   HLA-B27 Antigen (Completed)   Sedimentation Rate (Completed)   C-reactive protein (Completed)   Basic Metabolic Panel (Completed)   Osteoarthritis of ankle and foot, unspecified laterality       Relevant Medications   triamcinolone acetonide (KENALOG) 10 MG/ML injection 10 mg   Arthritis of foot       Relevant Medications   triamcinolone acetonide (KENALOG) 10 MG/ML injection 10 mg     -Complete examination performed  -Re-Discussed treatement options; discussed HAV deformity;conservative and  Surgical management; risks, benefits, alternatives discussed. All patient's  questions answered. -After verbal consent, injected into right and left 1st MTPJ for symptomatic relief 1cc lidocaine plain, 1cc marcaine plain, 0.5cc dexamethasone without complication.  This is injection # 2.  -Recommend good supportive wider shoes and OTC inserts.  -Encouraged rest, ice, elevation as needed -Patient to return to office after arthritic panel and after he sees his PCP for medical clearance for consideration for surgery or sooner if condition worsens.  Landis Martins, DPM

## 2015-09-28 ENCOUNTER — Ambulatory Visit: Payer: Medicaid Other | Admitting: Unknown Physician Specialty

## 2015-09-28 LAB — CBC WITH DIFFERENTIAL/PLATELET
Basophils Absolute: 0 10*3/uL (ref 0.0–0.2)
Basos: 0 %
EOS (ABSOLUTE): 0.2 10*3/uL (ref 0.0–0.4)
Eos: 5 %
Hematocrit: 38.9 % (ref 37.5–51.0)
Hemoglobin: 13.8 g/dL (ref 12.6–17.7)
Immature Grans (Abs): 0 10*3/uL (ref 0.0–0.1)
Immature Granulocytes: 0 %
Lymphocytes Absolute: 2.3 10*3/uL (ref 0.7–3.1)
Lymphs: 47 %
MCH: 31.2 pg (ref 26.6–33.0)
MCHC: 35.5 g/dL (ref 31.5–35.7)
MCV: 88 fL (ref 79–97)
Monocytes Absolute: 0.5 10*3/uL (ref 0.1–0.9)
Monocytes: 9 %
Neutrophils Absolute: 1.9 10*3/uL (ref 1.4–7.0)
Neutrophils: 39 %
Platelets: 270 10*3/uL (ref 150–379)
RBC: 4.43 x10E6/uL (ref 4.14–5.80)
RDW: 14.4 % (ref 12.3–15.4)
WBC: 4.9 10*3/uL (ref 3.4–10.8)

## 2015-09-28 LAB — BASIC METABOLIC PANEL
BUN/Creatinine Ratio: 17 (ref 10–24)
BUN: 26 mg/dL (ref 8–27)
CO2: 22 mmol/L (ref 18–29)
Calcium: 9.6 mg/dL (ref 8.6–10.2)
Chloride: 96 mmol/L (ref 96–106)
Creatinine, Ser: 1.49 mg/dL — ABNORMAL HIGH (ref 0.76–1.27)
GFR calc Af Amer: 52 mL/min/{1.73_m2} — ABNORMAL LOW (ref >59)
GFR calc non Af Amer: 45 mL/min/{1.73_m2} — ABNORMAL LOW (ref >59)
Glucose: 92 mg/dL (ref 65–99)
Potassium: 4.3 mmol/L (ref 3.5–5.2)
Sodium: 136 mmol/L (ref 134–144)

## 2015-09-28 LAB — HLA-B27 ANTIGEN: HLA B27: NEGATIVE

## 2015-09-28 LAB — RHEUMATOID FACTOR: Rhuematoid fact SerPl-aCnc: <10 IU/mL (ref 0.0–13.9)

## 2015-09-28 LAB — C-REACTIVE PROTEIN: CRP: 2.3 mg/L (ref 0.0–4.9)

## 2015-09-28 LAB — SEDIMENTATION RATE: Sed Rate: 2 mm/hr (ref 0–30)

## 2015-09-28 LAB — URIC ACID: Uric Acid: 7.9 mg/dL (ref 3.7–8.6)

## 2015-10-19 ENCOUNTER — Ambulatory Visit (INDEPENDENT_AMBULATORY_CARE_PROVIDER_SITE_OTHER): Payer: Medicaid Other | Admitting: Sports Medicine

## 2015-10-19 ENCOUNTER — Encounter: Payer: Self-pay | Admitting: Sports Medicine

## 2015-10-19 DIAGNOSIS — M778 Other enthesopathies, not elsewhere classified: Secondary | ICD-10-CM

## 2015-10-19 DIAGNOSIS — Z01818 Encounter for other preprocedural examination: Secondary | ICD-10-CM

## 2015-10-19 DIAGNOSIS — M7750 Other enthesopathy of unspecified foot: Secondary | ICD-10-CM | POA: Diagnosis not present

## 2015-10-19 DIAGNOSIS — M79673 Pain in unspecified foot: Secondary | ICD-10-CM

## 2015-10-19 DIAGNOSIS — M216X9 Other acquired deformities of unspecified foot: Secondary | ICD-10-CM

## 2015-10-19 DIAGNOSIS — M21619 Bunion of unspecified foot: Secondary | ICD-10-CM

## 2015-10-19 DIAGNOSIS — M779 Enthesopathy, unspecified: Secondary | ICD-10-CM

## 2015-10-19 NOTE — Patient Instructions (Signed)
Pre-Operative Instructions  Congratulations, you have decided to take an important step to improving your quality of life.  You can be assured that the doctors of [facility] will be with you every step of the way.  1. Plan to be at the surgery center/hospital at least 1 (one) hour prior to your scheduled time unless otherwise directed by the surgical center/hospital staff.  You must have a responsible adult accompany you, remain during the surgery and drive you home.  Make sure you have directions to the surgical center/hospital and know how to get there on time. 2. For hospital based surgery you will need to obtain a history and physical form from your family physician within 1 month prior to the date of surgery- we will give you a form for you primary physician.  3. We make every effort to accommodate the date you request for surgery.  There are however, times where surgery dates or times have to be moved.  We will contact you as soon as possible if a change in schedule is required.   4. No Aspirin/Ibuprofen for one week before surgery.  If you are on aspirin, any non-steroidal anti-inflammatory medications (Mobic, Aleve, Ibuprofen) you should stop taking it 7 days prior to your surgery.  You make take Tylenol  For pain prior to surgery.  5. Medications- If you are taking daily heart and blood pressure medications, seizure, reflux, allergy, asthma, anxiety, pain or diabetes medications, make sure the surgery center/hospital is aware before the day of surgery so they may notify you which medications to take or avoid the day of surgery. 6. No food or drink after midnight the night before surgery unless directed otherwise by surgical center/hospital staff. 7. No alcoholic beverages 24 hours prior to surgery.  No smoking 24 hours prior to or 24 hours after surgery. 8. Wear loose pants or shorts- loose enough to fit over bandages, boots, and casts. 9. No slip on shoes, sneakers are best. 10. Bring  your boot with you to the surgery center/hospital.  Also bring crutches or a walker if your physician has prescribed it for you.  If you do not have this equipment, it will be provided for you after surgery. 11. If you have not been contracted by the surgery center/hospital by the day before your surgery, call to confirm the date and time of your surgery. 12. Leave-time from work may vary depending on the type of surgery you have.  Appropriate arrangements should be made prior to surgery with your employer. 13. Prescriptions will be provided immediately following surgery by your doctor.  Have these filled as soon as possible after surgery and take the medication as directed. 14. Remove nail polish on the operative foot. 15. Wash the night before surgery.  The night before surgery wash the foot and leg well with the antibacterial soap provided and water paying special attention to beneath the toenails and in between the toes.  Rinse thoroughly with water and dry well with a towel.  Perform this wash unless told not to do so by your physician.  Enclosed: 1 Ice pack (please put in freezer the night before surgery)   1 Hibiclens skin cleaner   Pre-op Instructions  If you have any questions regarding the instructions, do not hesitate to call our office.  [place]: [address] [place], [place] [address] [phone]  [place]: [address] [place], [place] [address] [phone]  [place]: [address]  [place], [place] [address] [phone]   Dr.   Norman Regal DPM, Dr. Matthew Wagoner DPM, Dr. M. Todd Hyatt DPM, Dr. Olive Zmuda DPM 

## 2015-10-19 NOTE — Progress Notes (Signed)
Patient ID: Gerald Hodges, male   DOB: Apr 14, 1935, 80 y.o.   MRN: [id number]  Subjective: Gerald Hodges is a 80 y.o. male patient who returns to office for evaluation of Left>Right bunion pain. S/p injection #2, 3.5 weeks ago to sites. Reports a little relief but now starting to hurt at L>R bunion. Patient is here for surgery consult and review of blood work as well. Patient denies any other pedal complaints.   Patient is assisted by Gerald Hodges who is translating during this visit.   Patient Active Problem List   Diagnosis Date Noted  . Low back pain 09/21/2015  . Allergic rhinitis 08/04/2015  . Stable angina (HCC) 06/28/2015  . Bunion of unspecified foot 06/28/2015  . Benign hypertension with chronic kidney disease 10/09/2014  . Gout 10/09/2014  . Essential hypertension, benign 08/18/2014  . Psoriasis 08/18/2014  . Chronic kidney disease, stage III (moderate) 08/18/2014  . Hyperlipemia 08/18/2014  . CFIDS (chronic fatigue and immune dysfunction syndrome) 02/17/2014  . APC (atrial premature contractions) 02/17/2014  . Chronic kidney disease 11/18/2013  . Acid reflux 03/19/2013  . Arteriosclerosis of coronary artery 03/18/2013  . Angina pectoris (HCC) 10/09/2012  . Breath shortness 10/09/2012  . Chest pain 10/06/2012  . Creatinine elevation 10/06/2012  . HLD (hyperlipidemia) 10/06/2012    Current Outpatient Prescriptions on File Prior to Visit  Medication Sig Dispense Refill  . allopurinol (ZYLOPRIM) 100 MG tablet Take 1 tablet (100 mg total) by mouth daily. 90 tablet 1  . amLODipine (NORVASC) 10 MG tablet Take 1 tablet (10 mg total) by mouth daily. 90 tablet 1  . aspirin 81 MG tablet Take 81 mg by mouth daily. Reported on 09/21/2015    . atorvastatin (LIPITOR) 40 MG tablet Take 40 mg by mouth daily.    . carvedilol (COREG) 6.25 MG tablet Take 1 tablet (6.25 mg total) by mouth 2 (two) times daily with a meal. 180 tablet 1  . colchicine 0.6 MG tablet Take 1 tablet (0.6 mg total) by  mouth daily. 30 tablet 2  . fluticasone (FLONASE) 50 MCG/ACT nasal spray Place 2 sprays into both nostrils daily. 16 g 6  . furosemide (LASIX) 40 MG tablet Take 1 tablet (40 mg total) by mouth 2 (two) times daily. Reported on 06/28/2015 90 tablet 1  . isosorbide mononitrate (IMDUR) 60 MG 24 hr tablet Take 1 tablet (60 mg total) by mouth daily. 90 tablet 1  . losartan (COZAAR) 50 MG tablet Take 1 tablet (50 mg total) by mouth daily. 30 tablet 1  . nitroGLYCERIN (NITROSTAT) 0.4 MG SL tablet Place 1 tablet (0.4 mg total) under the tongue every 5 (five) minutes as needed for chest pain (chest pain). 20 tablet 1  . omeprazole (PRILOSEC) 20 MG capsule Take 1 capsule (20 mg total) by mouth daily. 90 capsule 3   Current Facility-Administered Medications on File Prior to Visit  Medication Dose Route Frequency Provider Last Rate Last Dose  . triamcinolone acetonide (KENALOG) 10 MG/ML injection 10 mg  10 mg Other Once [employer], DPM      . triamcinolone acetonide (KENALOG) 10 MG/ML injection 10 mg  10 mg Other Once Asencion Islam, DPM        Allergies  Allergen Reactions  . Aspirin Cough  . Lisinopril Other (See Comments)    Throat itching    Objective:  General: Alert and oriented x3 in no acute distress  Dermatology: No open lesions bilateral lower extremities, no webspace macerations, no ecchymosis bilateral, all nails x  10 are well manicured.  Vascular: Dorsalis Pedis and Posterior Tibial pedal pulses 1/4, Capillary Fill Time 3 seconds, (+) scant pedal hair growth bilateral, no edema bilateral lower extremities, Temperature gradient within normal limits.  Neurology: Gross sensation intact via light touch bilateral, Protective sensation intact  with Semmes Weinstein Monofilament to all pedal sites, Position sense intact, vibratory intact bilateral, Deep tendon reflexes within normal limits bilateral, No babinski sign present bilateral. (-) Tinels sign bilateral.   Musculoskeletal: Mild  tenderness with palpation left>right bunion deformity, no crepitus with range of motion, deformity not reducible, trackbound on left and tracking on right, there is mild 1st ray hypermobility noted bilateral. Midtarsal, Subtalar joint, and ankle joint range of motion is within normal limits.  There is medial arch collapse on weightbearing, HAV deformity supported on ground with second toe crossover deformity noted Left>Right.   Arthritic panel- negative  Assessment and Plan: Problem List Items Addressed This Visit    None    Visit Diagnoses    Bunion    -  Primary   Capsulitis of foot, unspecified laterality       Foot pain, unspecified laterality         -Complete examination performed  -Re-Discussed treatement options; discussed HAV deformity;conservative and  Surgical management; risks, benefits, alternatives discussed. All patient's questions answered. -Arthritic panel negative -Patient opt for surgical management. Consent obtained for Left bunionectomy with plate and screws and possible kwire. Pre and Post op course explained. Risks, benefits, alternatives explained. No guarantees given or implied. Surgical booking slip submitted and provided patient with Surgical packet and info for [facility]. Our office scheduler will call patient to discuss date of surgery once clearances have been received as below -Will need PCP, cardiology and nephrology clearance prior; Patient goes to [facility] family practice -To Dispense CAM Walker at time of surgery. Patient will also need walker or wheelchair; DME request made  -Recommend good supportive wider shoes and OTC inserts meanwhile.  -Encouraged rest, ice, elevation as needed -Patient to return to office after surgery or sooner if condition worsens.  Asencion Islam, DPM

## 2015-10-26 ENCOUNTER — Telehealth: Payer: Self-pay | Admitting: *Deleted

## 2015-10-26 NOTE — Telephone Encounter (Signed)
"  This is Gerald Hodges.  I'm calling about my daddy's surgery.  He's supposed to have a bunion taken out.  We went to the doctor the week before last.  I don't know, we never received a phone call of when we are going to schedule surgery.  I want to know if you can help me out with that?  If you would return my call, I would appreciate."

## 2015-11-03 ENCOUNTER — Encounter: Payer: Self-pay | Admitting: *Deleted

## 2015-11-03 NOTE — Telephone Encounter (Signed)
I left a message for Trinna Post to give me a call back.  I need to know the name of Mr. Jaclyn Shaggy Nephrologist and Cardiologist so I can send medical clearance letters to them.

## 2015-11-03 NOTE — Telephone Encounter (Signed)
Medical clearance requests were sent to Dr. Angelita Ingles and Gabriel Cirri, NP.  Waiting on responses.

## 2015-11-03 NOTE — Telephone Encounter (Signed)
"  This is Gerald Hodges, I'm sorry I missed your call."  I am trying to get medical clearance from his doctors per Dr. Marylene Land.  I've sent a letter to Gerald Cirri, NP and Gerald Hodges, Cardiologist.  [phone] the name of his Nephrologist?  "He doesn't have a Nephrologist. Those are the only 2 doctors that he sees."  I've sent them letters for clearance.  Once we get clearance from them, we'll give you a call to get him scheduled for surgery.  "Okay, you will call me to schedule his appointment when you hear from them?"  Yes, that is correct.

## 2015-11-16 ENCOUNTER — Other Ambulatory Visit: Payer: Self-pay | Admitting: Unknown Physician Specialty

## 2015-11-16 NOTE — Telephone Encounter (Signed)
"  Have you received the information from the Cardiologist?"  No, we haven't heard anything yet.  "How long does it take to get it, you know?"  No, it depends on the doctor.  I suggest you call him and see if you can follow up.  I sent the medical clearance letter.  "Okay, I will."

## 2015-11-23 ENCOUNTER — Telehealth: Payer: Self-pay | Admitting: Podiatry

## 2015-11-23 NOTE — Telephone Encounter (Signed)
pts son called stating they still have not heard about scheduling surgery. Per Delydia they still have not gotten the clearance from the pts cardiologist. Pts son is to call the cardiologist to check on this

## 2015-11-23 NOTE — Telephone Encounter (Signed)
"  I am calling to see if you got my daddy's clearance letter?"  Yes, we got it.  When would you like to schedule it?  "What days are available?"  She can do it on Monday.  "That date will be fine, what time?"  You'll probably get a call on Thursday or Friday from the surgical center.  They will tell you the time.  "Okay, thank you."  I called and informed Gerald Hodges that Dr. Marylene Land wants [name] to stop taking the baby Aspirin until after surgery.

## 2015-11-24 NOTE — Telephone Encounter (Signed)
I called [facility] and informed Aram Beecham that Dr. Marylene Land said she can use an Integra Lapidus Plate or 2, 3.5 mm cortical screws.  "Arlys John said it would probably be best to use the 3.27mm screws.  Integra Lapidus Plate is expensive too."  I will let her know.

## 2015-11-24 NOTE — Telephone Encounter (Signed)
Aram Beecham from [facility] called.  She asked if a different plate can be used for patient's surgery.  Plate costs more than the procedure itself. (Integra base wedge plate)

## 2015-11-24 NOTE — Telephone Encounter (Signed)
We received medical clearance.  We can schedule his surgery.  When would you like to schedule him?  "Let us do it as soon as possible."  She can do it on Monday.  "That will be good, in [facility] right?"  Yes it will be done at [facility].  They will call with the arrival time.

## 2015-11-24 NOTE — Telephone Encounter (Signed)
Integra lapidus plate or I can use 2, 3.5-mm cortical screws  -Dr. Kathie Rhodes

## 2015-11-25 ENCOUNTER — Telehealth: Payer: Self-pay | Admitting: Unknown Physician Specialty

## 2015-11-25 DIAGNOSIS — H5213 Myopia, bilateral: Secondary | ICD-10-CM

## 2015-11-25 NOTE — Telephone Encounter (Signed)
Referral generated

## 2015-11-25 NOTE — Telephone Encounter (Signed)
Pt needs a referral to go to [facility] for annual eye exam.

## 2015-11-25 NOTE — Telephone Encounter (Signed)
Routing to provider  

## 2015-11-29 ENCOUNTER — Encounter: Payer: Self-pay | Admitting: Sports Medicine

## 2015-11-29 DIAGNOSIS — M2012 Hallux valgus (acquired), left foot: Secondary | ICD-10-CM | POA: Diagnosis not present

## 2015-11-30 ENCOUNTER — Telehealth: Payer: Self-pay | Admitting: Sports Medicine

## 2015-11-30 ENCOUNTER — Telehealth: Payer: Self-pay | Admitting: *Deleted

## 2015-11-30 ENCOUNTER — Telehealth: Payer: Self-pay

## 2015-11-30 MED ORDER — ACETAMINOPHEN-CODEINE #4 300-60 MG PO TABS: 1.0000 | ORAL_TABLET | Freq: Four times a day (QID) | ORAL | 0 refills | Status: DC | PRN

## 2015-11-30 NOTE — Telephone Encounter (Signed)
Post op check phone call made to patient. Patient did not answer. Left voicemail with call back phone # advising patient to call if there are any issues and to follow up as scheduled on next week. -Dr. Marylene Land

## 2015-11-30 NOTE — Telephone Encounter (Signed)
error 

## 2015-11-30 NOTE — Telephone Encounter (Signed)
I called and asked Gerald Hodges to change patient's prescription to Tylenol #4 quantity 60 sig: one po every 6 hours as needed for pain.Marland Kitchen  Hydrocodone 10/325 required prior authorization.

## 2015-12-03 NOTE — Progress Notes (Signed)
DOS 09.18.2017 Left Bunionectomy with Possible Plate with Screws and K-Wire

## 2015-12-07 ENCOUNTER — Ambulatory Visit (INDEPENDENT_AMBULATORY_CARE_PROVIDER_SITE_OTHER): Payer: Medicaid Other

## 2015-12-07 ENCOUNTER — Encounter: Payer: Self-pay | Admitting: Sports Medicine

## 2015-12-07 ENCOUNTER — Ambulatory Visit (INDEPENDENT_AMBULATORY_CARE_PROVIDER_SITE_OTHER): Payer: Medicaid Other | Admitting: Sports Medicine

## 2015-12-07 VITALS — BP 116/74 | HR 83 | Temp 98.8°F | Resp 16

## 2015-12-07 DIAGNOSIS — Z9889 Other specified postprocedural states: Secondary | ICD-10-CM

## 2015-12-07 DIAGNOSIS — M216X9 Other acquired deformities of unspecified foot: Secondary | ICD-10-CM | POA: Diagnosis not present

## 2015-12-07 DIAGNOSIS — M79672 Pain in left foot: Secondary | ICD-10-CM

## 2015-12-07 DIAGNOSIS — M21619 Bunion of unspecified foot: Secondary | ICD-10-CM

## 2015-12-07 NOTE — Progress Notes (Signed)
Subjective: Gerald Hodges is a 80 y.o. male patient seen today in office for POV #1 (DOS 11-29-15), S/P Keller bunionectomy and base wedge osteotomy. Patient denies pain at surgical site, denies calf pain, denies headache, chest pain, shortness of breath, nausea, vomiting, fever, or chills. No other issues noted.   Patient is assisted by son and grandson this visit who is  Interpreting.  Patient Active Problem List   Diagnosis Date Noted  . Low back pain 09/21/2015  . Allergic rhinitis 08/04/2015  . Stable angina (HCC) 06/28/2015  . Bunion of unspecified foot 06/28/2015  . Benign hypertension with chronic kidney disease 10/09/2014  . Gout 10/09/2014  . Essential hypertension, benign 08/18/2014  . Psoriasis 08/18/2014  . Chronic kidney disease, stage III (moderate) 08/18/2014  . Hyperlipemia 08/18/2014  . CFIDS (chronic fatigue and immune dysfunction syndrome) 02/17/2014  . APC (atrial premature contractions) 02/17/2014  . Chronic kidney disease 11/18/2013  . Acid reflux 03/19/2013  . Arteriosclerosis of coronary artery 03/18/2013  . Angina pectoris (HCC) 10/09/2012  . Breath shortness 10/09/2012  . Chest pain 10/06/2012  . Creatinine elevation 10/06/2012  . HLD (hyperlipidemia) 10/06/2012    Current Outpatient Prescriptions on File Prior to Visit  Medication Sig Dispense Refill  . acetaminophen-codeine (TYLENOL #4) 300-60 MG tablet Take 1 tablet by mouth every 6 (six) hours as needed for moderate pain. 60 tablet 0  . allopurinol (ZYLOPRIM) 100 MG tablet Take 1 tablet (100 mg total) by mouth daily. 90 tablet 1  . amLODipine (NORVASC) 10 MG tablet Take 1 tablet (10 mg total) by mouth daily. 90 tablet 1  . aspirin 81 MG tablet Take 81 mg by mouth daily. Reported on 09/21/2015    . atorvastatin (LIPITOR) 40 MG tablet Take 40 mg by mouth daily.    . carvedilol (COREG) 6.25 MG tablet Take 1 tablet (6.25 mg total) by mouth 2 (two) times daily with a meal. 180 tablet 1  . colchicine 0.6  MG tablet Take 1 tablet (0.6 mg total) by mouth daily. 30 tablet 2  . docusate sodium (COLACE) 100 MG capsule Take 100 mg by mouth daily as needed for mild constipation.    . fluticasone (FLONASE) 50 MCG/ACT nasal spray Place 2 sprays into both nostrils daily. 16 g 6  . furosemide (LASIX) 40 MG tablet Take 1 tablet (40 mg total) by mouth 2 (two) times daily. Reported on 06/28/2015 90 tablet 1  . HYDROcodone-acetaminophen (NORCO) 10-325 MG tablet Take 1 tablet by mouth every 6 (six) hours as needed.    . isosorbide mononitrate (IMDUR) 60 MG 24 hr tablet Take 1 tablet (60 mg total) by mouth daily. 90 tablet 1  . losartan (COZAAR) 50 MG tablet take 1 tablet by mouth once daily 30 tablet 6  . nitroGLYCERIN (NITROSTAT) 0.4 MG SL tablet Place 1 tablet (0.4 mg total) under the tongue every 5 (five) minutes as needed for chest pain (chest pain). 20 tablet 1  . omeprazole (PRILOSEC) 20 MG capsule Take 1 capsule (20 mg total) by mouth daily. 90 capsule 3  . promethazine (PHENERGAN) 25 MG tablet Take 25 mg by mouth every 8 (eight) hours as needed for nausea or vomiting.     Current Facility-Administered Medications on File Prior to Visit  Medication Dose Route Frequency Provider Last Rate Last Dose  . triamcinolone acetonide (KENALOG) 10 MG/ML injection 10 mg  10 mg Other Once [employer], DPM      . triamcinolone acetonide (KENALOG) 10 MG/ML injection 10 mg  10 mg Other Once Asencion Islamitorya Calab Sachse, DPM        Allergies  Allergen Reactions  . Aspirin Cough  . Lisinopril Other (See Comments)    Throat itching    Objective: There were no vitals filed for this visit.  General: No acute distress, AAOx3  Left foot: Sutures intact with no gapping or dehiscence at surgical site, mild swelling to left forefoot, no erythema, no warmth, no drainage, no signs of infection noted, Capillary fill time <3 seconds in all digits, gross sensation present via light touch to left foot. No pain or crepitation with range of  motion left foot.  No pain with calf compression.   Post Op Xray, Left foot: Kwire and screw in excellent alignment and position. Osteotomy sites healing. Hardware intact. Soft tissue swelling within normal limits for post op status.   Assessment and Plan:  Problem List Items Addressed This Visit    None    Visit Diagnoses    S/P foot surgery, left    -  Primary   Left foot pain       Bunion       Relevant Orders   DG Foot Complete Left     -Patient seen and evaluated -Applied dry sterile dressing to surgical site left foot secured with ACE wrap and stockinet  -Advised patient to make sure to keep dressings clean, dry, and intact to left surgical site, removing the ACE as needed  -Advised patient to continue with walker and CAM boot to left foot  -Advised patient to limit activity to necessity  -Advised patient to ice and elevate as necessary  -Continue with PRN and Pain meds (Tylenol #4) -Will plan for possible suture removal at next office visit if ready. In the meantime, patient to call office if any issues or problems arise.   Asencion Islamitorya Djimon Lundstrom, DPM

## 2015-12-13 ENCOUNTER — Other Ambulatory Visit: Payer: Self-pay | Admitting: Unknown Physician Specialty

## 2015-12-13 DIAGNOSIS — I10 Essential (primary) hypertension: Secondary | ICD-10-CM

## 2015-12-13 DIAGNOSIS — N183 Chronic kidney disease, stage 3 unspecified: Secondary | ICD-10-CM

## 2015-12-13 MED ORDER — FUROSEMIDE 40 MG PO TABS: 40.0000 mg | ORAL_TABLET | Freq: Two times a day (BID) | ORAL | 1 refills | Status: DC

## 2015-12-13 NOTE — Telephone Encounter (Signed)
Pt would like refill for furosemide (LASIX) 40 MG tablet sent to [facility] [address]

## 2015-12-14 ENCOUNTER — Ambulatory Visit (INDEPENDENT_AMBULATORY_CARE_PROVIDER_SITE_OTHER): Payer: Medicaid Other | Admitting: Sports Medicine

## 2015-12-14 VITALS — BP 121/86 | HR 57

## 2015-12-14 DIAGNOSIS — Z9889 Other specified postprocedural states: Secondary | ICD-10-CM

## 2015-12-14 DIAGNOSIS — M21619 Bunion of unspecified foot: Secondary | ICD-10-CM

## 2015-12-14 NOTE — Progress Notes (Signed)
Subjective: Gerald Hodges is a 80 y.o. male patient seen today in office for POV #2 (DOS 11-29-15), S/P Keller bunionectomy and base wedge osteotomy. Patient denies pain at surgical site, denies calf pain, denies headache, chest pain, shortness of breath, nausea, vomiting, fever, or chills. No other issues noted.   Patient is assisted by son and wife this visit who is interpreting.  Patient Active Problem List   Diagnosis Date Noted  . Low back pain 09/21/2015  . Allergic rhinitis 08/04/2015  . Stable angina (HCC) 06/28/2015  . Bunion of unspecified foot 06/28/2015  . Benign hypertension with chronic kidney disease 10/09/2014  . Gout 10/09/2014  . Essential hypertension, benign 08/18/2014  . Psoriasis 08/18/2014  . Chronic kidney disease, stage III (moderate) 08/18/2014  . Hyperlipemia 08/18/2014  . CFIDS (chronic fatigue and immune dysfunction syndrome) (HCC) 02/17/2014  . APC (atrial premature contractions) 02/17/2014  . Chronic kidney disease 11/18/2013  . Acid reflux 03/19/2013  . Arteriosclerosis of coronary artery 03/18/2013  . Angina pectoris (HCC) 10/09/2012  . Breath shortness 10/09/2012  . Chest pain 10/06/2012  . Creatinine elevation 10/06/2012  . HLD (hyperlipidemia) 10/06/2012    Current Outpatient Prescriptions on File Prior to Visit  Medication Sig Dispense Refill  . acetaminophen-codeine (TYLENOL #4) 300-60 MG tablet Take 1 tablet by mouth every 6 (six) hours as needed for moderate pain. 60 tablet 0  . allopurinol (ZYLOPRIM) 100 MG tablet Take 1 tablet (100 mg total) by mouth daily. 90 tablet 1  . amLODipine (NORVASC) 10 MG tablet Take 1 tablet (10 mg total) by mouth daily. 90 tablet 1  . aspirin 81 MG tablet Take 81 mg by mouth daily. Reported on 09/21/2015    . atorvastatin (LIPITOR) 40 MG tablet Take 40 mg by mouth daily.    . carvedilol (COREG) 6.25 MG tablet Take 1 tablet (6.25 mg total) by mouth 2 (two) times daily with a meal. 180 tablet 1  . colchicine 0.6  MG tablet Take 1 tablet (0.6 mg total) by mouth daily. 30 tablet 2  . docusate sodium (COLACE) 100 MG capsule Take 100 mg by mouth daily as needed for mild constipation.    . fluticasone (FLONASE) 50 MCG/ACT nasal spray Place 2 sprays into both nostrils daily. 16 g 6  . furosemide (LASIX) 40 MG tablet Take 1 tablet (40 mg total) by mouth 2 (two) times daily. Reported on 06/28/2015 90 tablet 1  . HYDROcodone-acetaminophen (NORCO) 10-325 MG tablet Take 1 tablet by mouth every 6 (six) hours as needed.    . isosorbide mononitrate (IMDUR) 60 MG 24 hr tablet Take 1 tablet (60 mg total) by mouth daily. 90 tablet 1  . losartan (COZAAR) 50 MG tablet take 1 tablet by mouth once daily 30 tablet 6  . nitroGLYCERIN (NITROSTAT) 0.4 MG SL tablet Place 1 tablet (0.4 mg total) under the tongue every 5 (five) minutes as needed for chest pain (chest pain). 20 tablet 1  . omeprazole (PRILOSEC) 20 MG capsule Take 1 capsule (20 mg total) by mouth daily. 90 capsule 3  . promethazine (PHENERGAN) 25 MG tablet Take 25 mg by mouth every 8 (eight) hours as needed for nausea or vomiting.     Current Facility-Administered Medications on File Prior to Visit  Medication Dose Route Frequency Provider Last Rate Last Dose  . triamcinolone acetonide (KENALOG) 10 MG/ML injection 10 mg  10 mg Other Once [employer], DPM      . triamcinolone acetonide (KENALOG) 10 MG/ML injection 10 mg  10 mg Other Once Asencion Islamitorya Rosalie Buenaventura, DPM        Allergies  Allergen Reactions  . Aspirin Cough  . Lisinopril Other (See Comments)    Throat itching    Objective: There were no vitals filed for this visit.  General: No acute distress, AAOx3  Left foot: Sutures intact with no dehiscence at surgical site, mild swelling to left forefoot, no erythema, no warmth, no drainage, no signs of infection noted, Capillary fill time <3 seconds in all digits, gross sensation present via light touch to left foot. No pain or crepitation with range of motion left  foot.  No pain with calf compression.    Assessment and Plan:  Problem List Items Addressed This Visit    None    Visit Diagnoses    S/P foot surgery, left    -  Primary   Bunion         -Patient seen and evaluated -Sutures were not ready to be removed today -Applied steristrips and dry sterile dressing to surgical site left foot secured with ACE wrap and stockinet  -Advised patient to make sure to keep dressings clean, dry, and intact to left surgical site, removing the ACE as needed  -Advised patient to continue with walker and CAM boot to left foot  -Advised patient to limit activity to necessity  -Advised patient to ice and elevate as necessary  -Continue with PRN and Pain meds (Tylenol #4) as needed  -Will plan for possible suture removal at next office visit if ready. In the meantime, patient to call office if any issues or problems arise.   Asencion Islamitorya Keondre Markson, DPM

## 2015-12-17 ENCOUNTER — Other Ambulatory Visit: Payer: Self-pay | Admitting: Unknown Physician Specialty

## 2015-12-17 MED ORDER — COLCHICINE 0.6 MG PO TABS: 0.6000 mg | ORAL_TABLET | Freq: Every day | ORAL | 2 refills | Status: DC

## 2015-12-21 ENCOUNTER — Ambulatory Visit (INDEPENDENT_AMBULATORY_CARE_PROVIDER_SITE_OTHER): Payer: Medicaid Other | Admitting: Sports Medicine

## 2015-12-21 VITALS — Temp 97.9°F

## 2015-12-21 DIAGNOSIS — Z9889 Other specified postprocedural states: Secondary | ICD-10-CM

## 2015-12-21 DIAGNOSIS — M21619 Bunion of unspecified foot: Secondary | ICD-10-CM

## 2015-12-21 NOTE — Progress Notes (Signed)
Subjective: Gerald Hodges is a 80 y.o. male patient seen today in office for POV #3 (DOS 11-29-15), S/P Keller bunionectomy and base wedge osteotomy. Patient denies pain at surgical site, denies calf pain, denies headache, chest pain, shortness of breath, nausea, vomiting, fever, or chills. No other issues noted.   Patient is assisted by son and wife this visit who is interpreting.  Patient Active Problem List   Diagnosis Date Noted  . Low back pain 09/21/2015  . Allergic rhinitis 08/04/2015  . Stable angina (HCC) 06/28/2015  . Bunion of unspecified foot 06/28/2015  . Benign hypertension with chronic kidney disease 10/09/2014  . Gout 10/09/2014  . Essential hypertension, benign 08/18/2014  . Psoriasis 08/18/2014  . Chronic kidney disease, stage III (moderate) 08/18/2014  . Hyperlipemia 08/18/2014  . CFIDS (chronic fatigue and immune dysfunction syndrome) (HCC) 02/17/2014  . APC (atrial premature contractions) 02/17/2014  . Chronic kidney disease 11/18/2013  . Acid reflux 03/19/2013  . Arteriosclerosis of coronary artery 03/18/2013  . Angina pectoris (HCC) 10/09/2012  . Breath shortness 10/09/2012  . Chest pain 10/06/2012  . Creatinine elevation 10/06/2012  . HLD (hyperlipidemia) 10/06/2012    Current Outpatient Prescriptions on File Prior to Visit  Medication Sig Dispense Refill  . acetaminophen-codeine (TYLENOL #4) 300-60 MG tablet Take 1 tablet by mouth every 6 (six) hours as needed for moderate pain. 60 tablet 0  . allopurinol (ZYLOPRIM) 100 MG tablet Take 1 tablet (100 mg total) by mouth daily. 90 tablet 1  . amLODipine (NORVASC) 10 MG tablet Take 1 tablet (10 mg total) by mouth daily. 90 tablet 1  . aspirin 81 MG tablet Take 81 mg by mouth daily. Reported on 09/21/2015    . atorvastatin (LIPITOR) 40 MG tablet Take 40 mg by mouth daily.    . carvedilol (COREG) 6.25 MG tablet Take 1 tablet (6.25 mg total) by mouth 2 (two) times daily with a meal. 180 tablet 1  . colchicine 0.6  MG tablet Take 1 tablet (0.6 mg total) by mouth daily. 30 tablet 2  . docusate sodium (COLACE) 100 MG capsule Take 100 mg by mouth daily as needed for mild constipation.    . fluticasone (FLONASE) 50 MCG/ACT nasal spray Place 2 sprays into both nostrils daily. 16 g 6  . furosemide (LASIX) 40 MG tablet Take 1 tablet (40 mg total) by mouth 2 (two) times daily. Reported on 06/28/2015 90 tablet 1  . HYDROcodone-acetaminophen (NORCO) 10-325 MG tablet Take 1 tablet by mouth every 6 (six) hours as needed.    . isosorbide mononitrate (IMDUR) 60 MG 24 hr tablet Take 1 tablet (60 mg total) by mouth daily. 90 tablet 1  . losartan (COZAAR) 50 MG tablet take 1 tablet by mouth once daily 30 tablet 6  . nitroGLYCERIN (NITROSTAT) 0.4 MG SL tablet Place 1 tablet (0.4 mg total) under the tongue every 5 (five) minutes as needed for chest pain (chest pain). 20 tablet 1  . omeprazole (PRILOSEC) 20 MG capsule Take 1 capsule (20 mg total) by mouth daily. 90 capsule 3  . promethazine (PHENERGAN) 25 MG tablet Take 25 mg by mouth every 8 (eight) hours as needed for nausea or vomiting.     Current Facility-Administered Medications on File Prior to Visit  Medication Dose Route Frequency Provider Last Rate Last Dose  . triamcinolone acetonide (KENALOG) 10 MG/ML injection 10 mg  10 mg Other Once [employer] Tinsley Lomas, DPM      . triamcinolone acetonide (KENALOG) 10 MG/ML injection 10 mg  10 mg Other Once Asencion Islam, DPM        Allergies  Allergen Reactions  . Aspirin Cough  . Lisinopril Other (See Comments)    Throat itching    Objective: There were no vitals filed for this visit.  General: No acute distress, AAOx3  Left foot: Sutures intact with no dehiscence at surgical site, mild swelling to left forefoot, no erythema, no warmth, no drainage, no signs of infection noted, Capillary fill time <3 seconds in all digits, gross sensation present via light touch to left foot. No pain or crepitation with range of motion left  foot.  No pain with calf compression.    Assessment and Plan:  Problem List Items Addressed This Visit    None    Visit Diagnoses    S/P foot surgery, left    -  Primary   Bunion         -Patient seen and evaluated -Sutures were not ready to be removed today -Applied betadine and steristrips and dry sterile dressing to surgical site left foot secured with ACE wrap and stockinet  -Advised patient to make sure to keep dressings clean, dry, and intact to left surgical site, removing the ACE as needed  -Advised patient to continue with walker and CAM boot to left foot  -Advised patient to limit activity to necessity  -Advised patient to ice and elevate as necessary  -Continue with PRN and Pain meds (Tylenol #4) as needed  -Will plan for suture removal at next office visit and also Xray right foot to consider if Rober Minion is ready to be removed. In the meantime, patient to call office if any issues or problems arise.   Asencion Islam, DPM

## 2015-12-28 ENCOUNTER — Ambulatory Visit (INDEPENDENT_AMBULATORY_CARE_PROVIDER_SITE_OTHER): Payer: Medicaid Other | Admitting: Sports Medicine

## 2015-12-28 ENCOUNTER — Encounter: Payer: Self-pay | Admitting: Sports Medicine

## 2015-12-28 DIAGNOSIS — M21619 Bunion of unspecified foot: Secondary | ICD-10-CM

## 2015-12-28 DIAGNOSIS — M79672 Pain in left foot: Secondary | ICD-10-CM

## 2015-12-28 DIAGNOSIS — Z9889 Other specified postprocedural states: Secondary | ICD-10-CM

## 2015-12-28 NOTE — Progress Notes (Signed)
Subjective: Gerald Hodges is a 80 y.o. male patient seen today in office for POV #4 (DOS 11-29-15), S/P Keller bunionectomy and base wedge osteotomy. Patient denies pain at surgical site, denies calf pain, denies headache, chest pain, shortness of breath, nausea, vomiting, fever, or chills. No other issues noted.   Patient is assisted by Gerald Hodges and Gerald Hodges this visit who is interpreting.  Patient Active Problem List   Diagnosis Date Noted  . Low back pain 09/21/2015  . Allergic rhinitis 08/04/2015  . Stable angina (HCC) 06/28/2015  . Bunion of unspecified foot 06/28/2015  . Benign hypertension with chronic kidney disease 10/09/2014  . Gout 10/09/2014  . Essential hypertension, benign 08/18/2014  . Psoriasis 08/18/2014  . Chronic kidney disease, stage III (moderate) 08/18/2014  . Hyperlipemia 08/18/2014  . CFIDS (chronic fatigue and immune dysfunction syndrome) (HCC) 02/17/2014  . APC (atrial premature contractions) 02/17/2014  . Chronic kidney disease 11/18/2013  . Acid reflux 03/19/2013  . Arteriosclerosis of coronary artery 03/18/2013  . Angina pectoris (HCC) 10/09/2012  . Breath shortness 10/09/2012  . Chest pain 10/06/2012  . Creatinine elevation 10/06/2012  . HLD (hyperlipidemia) 10/06/2012    Current Outpatient Prescriptions on File Prior to Visit  Medication Sig Dispense Refill  . acetaminophen-codeine (TYLENOL #4) 300-60 MG tablet Take 1 tablet by mouth every 6 (six) hours as needed for moderate pain. 60 tablet 0  . allopurinol (ZYLOPRIM) 100 MG tablet Take 1 tablet (100 mg total) by mouth daily. 90 tablet 1  . amLODipine (NORVASC) 10 MG tablet Take 1 tablet (10 mg total) by mouth daily. 90 tablet 1  . aspirin 81 MG tablet Take 81 mg by mouth daily. Reported on 09/21/2015    . atorvastatin (LIPITOR) 40 MG tablet Take 40 mg by mouth daily.    . carvedilol (COREG) 6.25 MG tablet Take 1 tablet (6.25 mg total) by mouth 2 (two) times daily with a meal. 180 tablet 1  . colchicine 0.6  MG tablet Take 1 tablet (0.6 mg total) by mouth daily. 30 tablet 2  . docusate sodium (COLACE) 100 MG capsule Take 100 mg by mouth daily as needed for mild constipation.    . fluticasone (FLONASE) 50 MCG/ACT nasal spray Place 2 sprays into both nostrils daily. 16 g 6  . furosemide (LASIX) 40 MG tablet Take 1 tablet (40 mg total) by mouth 2 (two) times daily. Reported on 06/28/2015 90 tablet 1  . HYDROcodone-acetaminophen (NORCO) 10-325 MG tablet Take 1 tablet by mouth every 6 (six) hours as needed.    . isosorbide mononitrate (IMDUR) 60 MG 24 hr tablet Take 1 tablet (60 mg total) by mouth daily. 90 tablet 1  . losartan (COZAAR) 50 MG tablet take 1 tablet by mouth once daily 30 tablet 6  . nitroGLYCERIN (NITROSTAT) 0.4 MG SL tablet Place 1 tablet (0.4 mg total) under the tongue every 5 (five) minutes as needed for chest pain (chest pain). 20 tablet 1  . omeprazole (PRILOSEC) 20 MG capsule Take 1 capsule (20 mg total) by mouth daily. 90 capsule 3  . promethazine (PHENERGAN) 25 MG tablet Take 25 mg by mouth every 8 (eight) hours as needed for nausea or vomiting.     Current Facility-Administered Medications on File Prior to Visit  Medication Dose Route Frequency Provider Last Rate Last Dose  . triamcinolone acetonide (KENALOG) 10 MG/ML injection 10 mg  10 mg Other Once [employer] Hien Perreira, DPM      . triamcinolone acetonide (KENALOG) 10 MG/ML injection 10 mg  10 mg Other Once Asencion Islam, DPM        Allergies  Allergen Reactions  . Aspirin Cough and Other (See Comments)  . Lisinopril Other (See Comments)    Throat itching Throat itching Throat itching    Objective: There were no vitals filed for this visit.  General: No acute distress, AAOx3  Left foot: Sutures intact with no dehiscence at surgical site, mild swelling to left forefoot, no erythema, no warmth, no drainage, no signs of infection noted, Capillary fill time <3 seconds in all digits, gross sensation present via light touch to left  foot. No pain or crepitation with range of motion left foot.  No pain with calf compression.    Assessment and Plan:  Problem List Items Addressed This Visit    None    Visit Diagnoses    S/P foot surgery, left    -  Primary   Bunion       Left foot pain         -Patient seen and evaluated -Sutures removed and then applied betadine and steristrips and dry sterile dressing to surgical site left foot secured with ACE wrap and stockinet  -Advised patient to make sure to keep dressings clean, dry, and intact to left surgical site, removing the ACE as needed  -Advised patient to continue with walker and CAM boot to left foot  -Advised patient to limit activity to necessity  -Advised patient to ice and elevate as necessary  -Continue with PRN and Pain meds (Tylenol #4) as needed  -Will plan for Xray right foot to consider if Rober Minion is ready to be removed in 2 weeks. In the meantime, patient to call office if any issues or problems arise.   Asencion Islam, DPM

## 2016-01-11 ENCOUNTER — Ambulatory Visit (INDEPENDENT_AMBULATORY_CARE_PROVIDER_SITE_OTHER): Payer: Medicaid Other | Admitting: Sports Medicine

## 2016-01-11 ENCOUNTER — Ambulatory Visit (INDEPENDENT_AMBULATORY_CARE_PROVIDER_SITE_OTHER): Payer: Medicaid Other

## 2016-01-11 ENCOUNTER — Encounter: Payer: Self-pay | Admitting: Sports Medicine

## 2016-01-11 DIAGNOSIS — M216X9 Other acquired deformities of unspecified foot: Secondary | ICD-10-CM

## 2016-01-11 DIAGNOSIS — M21619 Bunion of unspecified foot: Secondary | ICD-10-CM

## 2016-01-11 DIAGNOSIS — Z9889 Other specified postprocedural states: Secondary | ICD-10-CM

## 2016-01-11 NOTE — Progress Notes (Signed)
Subjective: Gerald Hodges is a 80 y.o. male patient seen today in office for POV #5 (DOS 11-29-15), S/P Keller bunionectomy and base wedge osteotomy. Patient denies pain at surgical site, denies calf pain, denies headache, chest pain, shortness of breath, nausea, vomiting, fever, or chills. No other issues noted.   Patient is assisted by son this visit who is interpreting.  Patient admits to walking without boot.  Patient Active Problem List   Diagnosis Date Noted  . Low back pain 09/21/2015  . Allergic rhinitis 08/04/2015  . Stable angina (HCC) 06/28/2015  . Bunion of unspecified foot 06/28/2015  . Benign hypertension with chronic kidney disease 10/09/2014  . Gout 10/09/2014  . Essential hypertension, benign 08/18/2014  . Psoriasis 08/18/2014  . Chronic kidney disease, stage III (moderate) 08/18/2014  . Hyperlipemia 08/18/2014  . CFIDS (chronic fatigue and immune dysfunction syndrome) (HCC) 02/17/2014  . APC (atrial premature contractions) 02/17/2014  . Chronic kidney disease 11/18/2013  . Acid reflux 03/19/2013  . Arteriosclerosis of coronary artery 03/18/2013  . Angina pectoris (HCC) 10/09/2012  . Breath shortness 10/09/2012  . Chest pain 10/06/2012  . Creatinine elevation 10/06/2012  . HLD (hyperlipidemia) 10/06/2012    Current Outpatient Prescriptions on File Prior to Visit  Medication Sig Dispense Refill  . acetaminophen-codeine (TYLENOL #4) 300-60 MG tablet Take 1 tablet by mouth every 6 (six) hours as needed for moderate pain. 60 tablet 0  . allopurinol (ZYLOPRIM) 100 MG tablet Take 1 tablet (100 mg total) by mouth daily. 90 tablet 1  . amLODipine (NORVASC) 10 MG tablet Take 1 tablet (10 mg total) by mouth daily. 90 tablet 1  . aspirin 81 MG tablet Take 81 mg by mouth daily. Reported on 09/21/2015    . atorvastatin (LIPITOR) 40 MG tablet Take 40 mg by mouth daily.    . carvedilol (COREG) 6.25 MG tablet Take 1 tablet (6.25 mg total) by mouth 2 (two) times daily with a  meal. 180 tablet 1  . colchicine 0.6 MG tablet Take 1 tablet (0.6 mg total) by mouth daily. 30 tablet 2  . docusate sodium (COLACE) 100 MG capsule Take 100 mg by mouth daily as needed for mild constipation.    . fluticasone (FLONASE) 50 MCG/ACT nasal spray Place 2 sprays into both nostrils daily. 16 g 6  . furosemide (LASIX) 40 MG tablet Take 1 tablet (40 mg total) by mouth 2 (two) times daily. Reported on 06/28/2015 90 tablet 1  . HYDROcodone-acetaminophen (NORCO) 10-325 MG tablet Take 1 tablet by mouth every 6 (six) hours as needed.    . isosorbide mononitrate (IMDUR) 60 MG 24 hr tablet Take 1 tablet (60 mg total) by mouth daily. 90 tablet 1  . losartan (COZAAR) 50 MG tablet take 1 tablet by mouth once daily 30 tablet 6  . nitroGLYCERIN (NITROSTAT) 0.4 MG SL tablet Place 1 tablet (0.4 mg total) under the tongue every 5 (five) minutes as needed for chest pain (chest pain). 20 tablet 1  . omeprazole (PRILOSEC) 20 MG capsule Take 1 capsule (20 mg total) by mouth daily. 90 capsule 3  . promethazine (PHENERGAN) 25 MG tablet Take 25 mg by mouth every 8 (eight) hours as needed for nausea or vomiting.     Current Facility-Administered Medications on File Prior to Visit  Medication Dose Route Frequency Provider Last Rate Last Dose  . triamcinolone acetonide (KENALOG) 10 MG/ML injection 10 mg  10 mg Other Once [employer], DPM      . triamcinolone acetonide (KENALOG)  10 MG/ML injection 10 mg  10 mg Other Once Asencion Islamitorya Paiton Boultinghouse, DPM        Allergies  Allergen Reactions  . Aspirin Cough and Other (See Comments)  . Lisinopril Other (See Comments)    Throat itching Throat itching Throat itching    Objective: There were no vitals filed for this visit.  General: No acute distress, AAOx3  Left foot: Incision intact at surgical site, mild swelling to left forefoot, no erythema, no warmth, no drainage, no signs of infection noted, Capillary fill time <3 seconds in all digits, gross sensation present via  light touch to left foot. No pain or crepitation with range of motion left foot.  No pain with calf compression.   Xray- small amount of loosing at proximal screw. Kwire intact.    Assessment and Plan:  Problem List Items Addressed This Visit    None    Visit Diagnoses    S/P foot surgery, left    -  Primary   Relevant Orders   DG Foot Complete Left   Bunion       Relevant Orders   DG Foot Complete Left     -Patient seen and evaluated -Applied steristrips and dry sterile dressing to surgical site left foot secured with stockinet  -Advised patient to make sure to keep steristrips, clean, dry, and intact to left surgical site, removing the Stockinette as needed  -Advised patient to continue with walker and CAM boot to left foot; Advised compliance to prevent more loosening of screw; will xray next visit to determine if screw needs to be removed -Advised patient to limit activity to necessity  -Advised patient to ice and elevate as necessary  -Continue with PRN and Pain meds (Tylenol #4) as needed  -Will plan for Xray right foot to consider if Rober Minionkwire is ready and consider possible screw removal plan if continues to loosen in 2 weeks. In the meantime, patient to call office if any issues or problems arise.   Asencion Islamitorya Ryler Laskowski, DPM

## 2016-01-25 ENCOUNTER — Ambulatory Visit (INDEPENDENT_AMBULATORY_CARE_PROVIDER_SITE_OTHER): Payer: Medicaid Other | Admitting: Sports Medicine

## 2016-01-25 ENCOUNTER — Ambulatory Visit (INDEPENDENT_AMBULATORY_CARE_PROVIDER_SITE_OTHER): Payer: Medicaid Other

## 2016-01-25 ENCOUNTER — Encounter: Payer: Self-pay | Admitting: Sports Medicine

## 2016-01-25 DIAGNOSIS — M216X9 Other acquired deformities of unspecified foot: Secondary | ICD-10-CM | POA: Diagnosis not present

## 2016-01-25 DIAGNOSIS — Z9889 Other specified postprocedural states: Secondary | ICD-10-CM

## 2016-01-25 DIAGNOSIS — M79672 Pain in left foot: Secondary | ICD-10-CM

## 2016-01-25 DIAGNOSIS — M21619 Bunion of unspecified foot: Secondary | ICD-10-CM

## 2016-01-25 NOTE — Patient Instructions (Signed)
Pre-Operative Instructions  Congratulations, you have decided to take an important step to improving your quality of life.  You can be assured that the doctors of [facility] will be with you every step of the way.  1. Plan to be at the surgery center/hospital at least 1 (one) hour prior to your scheduled time unless otherwise directed by the surgical center/hospital staff.  You must have a responsible adult accompany you, remain during the surgery and drive you home.  Make sure you have directions to the surgical center/hospital and know how to get there on time. 2. For hospital based surgery you will need to obtain a history and physical form from your family physician within 1 month prior to the date of surgery- we will give you a form for you primary physician.  3. We make every effort to accommodate the date you request for surgery.  There are however, times where surgery dates or times have to be moved.  We will contact you as soon as possible if a change in schedule is required.   4. No Aspirin/Ibuprofen for one week before surgery.  If you are on aspirin, any non-steroidal anti-inflammatory medications (Mobic, Aleve, Ibuprofen) you should stop taking it 7 days prior to your surgery.  You make take Tylenol  For pain prior to surgery.  5. Medications- If you are taking daily heart and blood pressure medications, seizure, reflux, allergy, asthma, anxiety, pain or diabetes medications, make sure the surgery center/hospital is aware before the day of surgery so they may notify you which medications to take or avoid the day of surgery. 6. No food or drink after midnight the night before surgery unless directed otherwise by surgical center/hospital staff. 7. No alcoholic beverages 24 hours prior to surgery.  No smoking 24 hours prior to or 24 hours after surgery. 8. Wear loose pants or shorts- loose enough to fit over bandages, boots, and casts. 9. No slip on shoes, sneakers are best. 10. Bring  your boot with you to the surgery center/hospital.  Also bring crutches or a walker if your physician has prescribed it for you.  If you do not have this equipment, it will be provided for you after surgery. 11. If you have not been contracted by the surgery center/hospital by the day before your surgery, call to confirm the date and time of your surgery. 12. Leave-time from work may vary depending on the type of surgery you have.  Appropriate arrangements should be made prior to surgery with your employer. 13. Prescriptions will be provided immediately following surgery by your doctor.  Have these filled as soon as possible after surgery and take the medication as directed. 14. Remove nail polish on the operative foot. 15. Wash the night before surgery.  The night before surgery wash the foot and leg well with the antibacterial soap provided and water paying special attention to beneath the toenails and in between the toes.  Rinse thoroughly with water and dry well with a towel.  Perform this wash unless told not to do so by your physician.  Enclosed: 1 Ice pack (please put in freezer the night before surgery)   1 Hibiclens skin cleaner   Pre-op Instructions  If you have any questions regarding the instructions, do not hesitate to call our office.  [place]: [address] [place], [place] [address] [phone]  [place]: [address] [place], [place] [address] [phone]  [place]: [address]  , [place] [address] [phone]   Dr.   Norman Regal DPM, Dr. Matthew Wagoner DPM, Dr. M. Todd Hyatt DPM, Dr. Demoni Gergen DPM 

## 2016-01-25 NOTE — Progress Notes (Signed)
Subjective: Gerald Hodges is a 80 y.o. male patient seen today in office for POV #6 (DOS 11-29-15), S/P Keller bunionectomy and base wedge osteotomy. Patient denies pain at surgical site, denies calf pain, denies headache, chest pain, shortness of breath, nausea, vomiting, fever, or chills. No other issues noted.   Patient is assisted by son this visit who is interpreting.  Patient Active Problem List   Diagnosis Date Noted  . Low back pain 09/21/2015  . Allergic rhinitis 08/04/2015  . Stable angina (HCC) 06/28/2015  . Bunion of unspecified foot 06/28/2015  . Benign hypertension with chronic kidney disease 10/09/2014  . Gout 10/09/2014  . Essential hypertension, benign 08/18/2014  . Psoriasis 08/18/2014  . Chronic kidney disease, stage III (moderate) 08/18/2014  . Hyperlipemia 08/18/2014  . CFIDS (chronic fatigue and immune dysfunction syndrome) (HCC) 02/17/2014  . APC (atrial premature contractions) 02/17/2014  . Chronic kidney disease 11/18/2013  . Acid reflux 03/19/2013  . Arteriosclerosis of coronary artery 03/18/2013  . Angina pectoris (HCC) 10/09/2012  . Breath shortness 10/09/2012  . Chest pain 10/06/2012  . Creatinine elevation 10/06/2012  . HLD (hyperlipidemia) 10/06/2012    Current Outpatient Prescriptions on File Prior to Visit  Medication Sig Dispense Refill  . acetaminophen-codeine (TYLENOL #4) 300-60 MG tablet Take 1 tablet by mouth every 6 (six) hours as needed for moderate pain. 60 tablet 0  . allopurinol (ZYLOPRIM) 100 MG tablet Take 1 tablet (100 mg total) by mouth daily. 90 tablet 1  . amLODipine (NORVASC) 10 MG tablet Take 1 tablet (10 mg total) by mouth daily. 90 tablet 1  . aspirin 81 MG tablet Take 81 mg by mouth daily. Reported on 09/21/2015    . atorvastatin (LIPITOR) 40 MG tablet Take 40 mg by mouth daily.    . carvedilol (COREG) 6.25 MG tablet Take 1 tablet (6.25 mg total) by mouth 2 (two) times daily with a meal. 180 tablet 1  . colchicine 0.6 MG  tablet Take 1 tablet (0.6 mg total) by mouth daily. 30 tablet 2  . docusate sodium (COLACE) 100 MG capsule Take 100 mg by mouth daily as needed for mild constipation.    . fluticasone (FLONASE) 50 MCG/ACT nasal spray Place 2 sprays into both nostrils daily. 16 g 6  . furosemide (LASIX) 40 MG tablet Take 1 tablet (40 mg total) by mouth 2 (two) times daily. Reported on 06/28/2015 90 tablet 1  . HYDROcodone-acetaminophen (NORCO) 10-325 MG tablet Take 1 tablet by mouth every 6 (six) hours as needed.    . isosorbide mononitrate (IMDUR) 60 MG 24 hr tablet Take 1 tablet (60 mg total) by mouth daily. 90 tablet 1  . losartan (COZAAR) 50 MG tablet take 1 tablet by mouth once daily 30 tablet 6  . nitroGLYCERIN (NITROSTAT) 0.4 MG SL tablet Place 1 tablet (0.4 mg total) under the tongue every 5 (five) minutes as needed for chest pain (chest pain). 20 tablet 1  . omeprazole (PRILOSEC) 20 MG capsule Take 1 capsule (20 mg total) by mouth daily. 90 capsule 3  . promethazine (PHENERGAN) 25 MG tablet Take 25 mg by mouth every 8 (eight) hours as needed for nausea or vomiting.     Current Facility-Administered Medications on File Prior to Visit  Medication Dose Route Frequency Provider Last Rate Last Dose  . triamcinolone acetonide (KENALOG) 10 MG/ML injection 10 mg  10 mg Other Once [employer] Kayode Petion, DPM      . triamcinolone acetonide (KENALOG) 10 MG/ML injection 10 mg  10 mg Other Once Asencion Islam, DPM        Allergies  Allergen Reactions  . Aspirin Cough and Other (See Comments)  . Lisinopril Other (See Comments)    Throat itching Throat itching Throat itching    Objective: There were no vitals filed for this visit.  General: No acute distress, AAOx3  Left foot:Wire at toe and Incision intact at surgical site, mild swelling to left forefoot, no erythema, no warmth, no drainage, no signs of infection noted, Capillary fill time <3 seconds in all digits, gross sensation present via light touch to left foot.  No pain or crepitation with range of motion left foot.  No pain with calf compression.   Xray- more loosing at proximal screw. Kwire intact.    Assessment and Plan:  Problem List Items Addressed This Visit    None    Visit Diagnoses    S/P foot surgery, left    -  Primary   Relevant Orders   DG Foot Complete Left   Bunion       Relevant Orders   DG Foot Complete Left   Left foot pain       Relevant Orders   DG Foot Complete Left     -Patient seen and evaluated -Patient opt for surgical management. Consent obtained for removal of hardware. Pre and Post op course explained. Risks, benefits, alternatives explained. No guarantees given or implied. Surgical booking slip submitted and provided patient with Surgical packet and info for [facility].  -Applied stockinette to left foot -Advised patient to continue with walker and CAM boot to left foot -Advised patient to limit activity to necessity  -Advised patient to ice and elevate as necessary  -Continue with PRN and Pain meds (Tylenol #4) as needed  -Will plan for OR removal of pin and screws and patient to follow up after surgery.   Asencion Islam, DPM

## 2016-01-31 ENCOUNTER — Encounter: Payer: Self-pay | Admitting: Sports Medicine

## 2016-01-31 DIAGNOSIS — M2012 Hallux valgus (acquired), left foot: Secondary | ICD-10-CM | POA: Diagnosis not present

## 2016-02-01 ENCOUNTER — Telehealth: Payer: Self-pay | Admitting: Sports Medicine

## 2016-02-01 NOTE — Telephone Encounter (Signed)
Post op check phone call made to patient. There was no answer. Left voicemail with call back #. Patient instructed to follow up next week and to call office if there is any problems or issues. -Dr. Marylene Land

## 2016-02-02 ENCOUNTER — Other Ambulatory Visit: Payer: Self-pay | Admitting: Unknown Physician Specialty

## 2016-02-02 DIAGNOSIS — M1A0720 Idiopathic chronic gout, left ankle and foot, without tophus (tophi): Secondary | ICD-10-CM

## 2016-02-02 MED ORDER — PREDNISONE 20 MG PO TABS: 40.0000 mg | ORAL_TABLET | Freq: Every day | ORAL | 0 refills | Status: DC

## 2016-02-07 ENCOUNTER — Telehealth: Payer: Self-pay | Admitting: Unknown Physician Specialty

## 2016-02-07 DIAGNOSIS — M1A0720 Idiopathic chronic gout, left ankle and foot, without tophus (tophi): Secondary | ICD-10-CM

## 2016-02-07 MED ORDER — PREDNISONE 20 MG PO TABS: 40.0000 mg | ORAL_TABLET | Freq: Every day | ORAL | 0 refills | Status: DC

## 2016-02-07 NOTE — Progress Notes (Signed)
DOS 11.20.2017 Removal of Hardware and Kwire Left Foot

## 2016-02-07 NOTE — Telephone Encounter (Signed)
Routing to provider  

## 2016-02-07 NOTE — Telephone Encounter (Signed)
It is dangerous to take this much.  I will order it just once more

## 2016-02-07 NOTE — Telephone Encounter (Signed)
Pts son would like to know if th pt could have more predniSONE (DELTASONE) 20 MG tablet sent to [facility] [address] until the PA for colchicine 0.6 MG tablet is approved.

## 2016-02-07 NOTE — Telephone Encounter (Signed)
Called and let patient know what Cheryl said.  

## 2016-02-08 ENCOUNTER — Ambulatory Visit (INDEPENDENT_AMBULATORY_CARE_PROVIDER_SITE_OTHER): Payer: Medicaid Other | Admitting: Sports Medicine

## 2016-02-08 ENCOUNTER — Encounter: Payer: Self-pay | Admitting: Sports Medicine

## 2016-02-08 DIAGNOSIS — Z9889 Other specified postprocedural states: Secondary | ICD-10-CM

## 2016-02-08 DIAGNOSIS — M21619 Bunion of unspecified foot: Secondary | ICD-10-CM

## 2016-02-08 DIAGNOSIS — M79672 Pain in left foot: Secondary | ICD-10-CM

## 2016-02-08 NOTE — Progress Notes (Signed)
Subjective: Gerald Hodges is a 80 y.o. male patient seen today in office for POV 1 (DOS 01-31-16), S/P hardware removal. Patient denies pain at surgical site, denies calf pain, denies headache, chest pain, shortness of breath, nausea, vomiting, fever, or chills. Patient states that he is doing well and is only taking Tylenol. No other issues noted.   Patient Active Problem List   Diagnosis Date Noted  . Low back pain 09/21/2015  . Allergic rhinitis 08/04/2015  . Stable angina (HCC) 06/28/2015  . Bunion of unspecified foot 06/28/2015  . Hypertension 10/09/2014  . Gout 10/09/2014  . Essential hypertension, benign 08/18/2014  . Psoriasis 08/18/2014  . Chronic kidney disease, stage III (moderate) 08/18/2014  . Hyperlipemia 08/18/2014  . CFIDS (chronic fatigue and immune dysfunction syndrome) (HCC) 02/17/2014  . APC (atrial premature contractions) 02/17/2014  . Chronic kidney disease 11/18/2013  . GERD (gastroesophageal reflux disease) 03/19/2013  . CAD (coronary artery disease) 03/18/2013  . Angina pectoris (HCC) 10/09/2012  . Breath shortness 10/09/2012  . Chest pain 10/06/2012  . Creatinine elevation 10/06/2012  . Hyperlipidemia 10/06/2012    Current Outpatient Prescriptions on File Prior to Visit  Medication Sig Dispense Refill  . acetaminophen-codeine (TYLENOL #4) 300-60 MG tablet Take 1 tablet by mouth every 6 (six) hours as needed for moderate pain. 60 tablet 0  . allopurinol (ZYLOPRIM) 100 MG tablet Take 1 tablet (100 mg total) by mouth daily. 90 tablet 1  . amLODipine (NORVASC) 10 MG tablet Take 1 tablet (10 mg total) by mouth daily. 90 tablet 1  . aspirin 81 MG tablet Take 81 mg by mouth daily. Reported on 09/21/2015    . atorvastatin (LIPITOR) 40 MG tablet Take 40 mg by mouth daily.    . carvedilol (COREG) 6.25 MG tablet Take 1 tablet (6.25 mg total) by mouth 2 (two) times daily with a meal. 180 tablet 1  . colchicine 0.6 MG tablet Take 1 tablet (0.6 mg total) by mouth  daily. 30 tablet 2  . docusate sodium (COLACE) 100 MG capsule Take 100 mg by mouth daily as needed for mild constipation.    . docusate sodium (COLACE) 100 MG capsule Take 100 mg by mouth daily.    . fluticasone (FLONASE) 50 MCG/ACT nasal spray Place 2 sprays into both nostrils daily. 16 g 6  . furosemide (LASIX) 40 MG tablet Take 1 tablet (40 mg total) by mouth 2 (two) times daily. Reported on 06/28/2015 90 tablet 1  . HYDROcodone-acetaminophen (NORCO) 10-325 MG tablet Take 1 tablet by mouth every 6 (six) hours as needed.    . isosorbide mononitrate (IMDUR) 60 MG 24 hr tablet Take 1 tablet (60 mg total) by mouth daily. 90 tablet 1  . losartan (COZAAR) 50 MG tablet take 1 tablet by mouth once daily 30 tablet 6  . nitroGLYCERIN (NITROSTAT) 0.4 MG SL tablet Place 1 tablet (0.4 mg total) under the tongue every 5 (five) minutes as needed for chest pain (chest pain). 20 tablet 1  . omeprazole (PRILOSEC) 20 MG capsule Take 1 capsule (20 mg total) by mouth daily. 90 capsule 3  . predniSONE (DELTASONE) 20 MG tablet Take 2 tablets (40 mg total) by mouth daily with breakfast. 10 tablet 0  . promethazine (PHENERGAN) 25 MG tablet Take 25 mg by mouth every 8 (eight) hours as needed for nausea or vomiting.    . promethazine (PHENERGAN) 25 MG tablet Take 25 mg by mouth every 8 (eight) hours as needed for nausea or vomiting.    .Marland Kitchen  UNABLE TO FIND Take 600 mg by mouth 1 day or 1 dose. Tylenol 600     Current Facility-Administered Medications on File Prior to Visit  Medication Dose Route Frequency Provider Last Rate Last Dose  . triamcinolone acetonide (KENALOG) 10 MG/ML injection 10 mg  10 mg Other Once [employer], DPM      . triamcinolone acetonide (KENALOG) 10 MG/ML injection 10 mg  10 mg Other Once Asencion Islam, DPM        Allergies  Allergen Reactions  . Aspirin Cough and Other (See Comments)  . Lisinopril Other (See Comments)    Throat itching Throat itching Throat itching    Objective: There  were no vitals filed for this visit.  General: No acute distress, AAOx3  Left foot: Sutures intact with no gapping or dehiscence at surgical site, mild swelling to left foot, no erythema, no warmth, no drainage, no signs of infection noted, Capillary fill time <3 seconds in all digits, gross sensation present via light touch to left foot. No pain or crepitation with range of motion left foot.  No pain with calf compression.   Post Op Xray, Left foot to be obtained at next visit.   Assessment and Plan:  Problem List Items Addressed This Visit    None    Visit Diagnoses    S/P foot surgery, left    -  Primary   Bunion       Left foot pain           -Patient seen and evaluated -Applied dry sterile dressing to surgical site left foot secured with ACE wrap and stockinet  -Advised patient to make sure to keep dressings clean, dry, and intact to left surgical site, removing the ACE as needed  -Advised patient to continue with [place] and CAM boot left foot   -Advised patient to limit activity to necessity  -Advised patient to ice and elevate as necessary  -Will plan for suture removal, xrays, and to discuss slowly transitioning from walking boot pending bone healing at next office visit. In the meantime, patient to call office if any issues or problems arise.   Asencion Islam, DPM

## 2016-02-18 ENCOUNTER — Encounter: Payer: Self-pay | Admitting: Sports Medicine

## 2016-02-20 ENCOUNTER — Other Ambulatory Visit: Payer: Self-pay | Admitting: Unknown Physician Specialty

## 2016-02-20 DIAGNOSIS — M1A0720 Idiopathic chronic gout, left ankle and foot, without tophus (tophi): Secondary | ICD-10-CM

## 2016-02-22 ENCOUNTER — Ambulatory Visit (INDEPENDENT_AMBULATORY_CARE_PROVIDER_SITE_OTHER): Payer: Medicaid Other

## 2016-02-22 ENCOUNTER — Ambulatory Visit (INDEPENDENT_AMBULATORY_CARE_PROVIDER_SITE_OTHER): Payer: Medicaid Other | Admitting: Sports Medicine

## 2016-02-22 ENCOUNTER — Encounter: Payer: Self-pay | Admitting: Sports Medicine

## 2016-02-22 DIAGNOSIS — M2012 Hallux valgus (acquired), left foot: Secondary | ICD-10-CM | POA: Diagnosis not present

## 2016-02-22 DIAGNOSIS — M79672 Pain in left foot: Secondary | ICD-10-CM

## 2016-02-22 DIAGNOSIS — Z9889 Other specified postprocedural states: Secondary | ICD-10-CM

## 2016-02-22 NOTE — Progress Notes (Signed)
Subjective: Gerald Hodges is a 80 y.o. male patient seen today in office for POV 2 (DOS 01-31-16), S/P hardware removal. Patient denies pain at surgical site, denies calf pain, denies headache, chest pain, shortness of breath, nausea, vomiting, fever, or chills. Patient states that he is doing well and is only taking Tylenol. No other issues noted.   Patient Active Problem List   Diagnosis Date Noted  . Low back pain 09/21/2015  . Allergic rhinitis 08/04/2015  . Stable angina (HCC) 06/28/2015  . Bunion of unspecified foot 06/28/2015  . Hypertension 10/09/2014  . Gout 10/09/2014  . Essential hypertension, benign 08/18/2014  . Psoriasis 08/18/2014  . Chronic kidney disease, stage III (moderate) 08/18/2014  . Hyperlipemia 08/18/2014  . CFIDS (chronic fatigue and immune dysfunction syndrome) (HCC) 02/17/2014  . APC (atrial premature contractions) 02/17/2014  . Chronic kidney disease 11/18/2013  . GERD (gastroesophageal reflux disease) 03/19/2013  . CAD (coronary artery disease) 03/18/2013  . Angina pectoris (HCC) 10/09/2012  . Breath shortness 10/09/2012  . Chest pain 10/06/2012  . Creatinine elevation 10/06/2012  . Hyperlipidemia 10/06/2012    Current Outpatient Prescriptions on File Prior to Visit  Medication Sig Dispense Refill  . acetaminophen-codeine (TYLENOL #4) 300-60 MG tablet Take 1 tablet by mouth every 6 (six) hours as needed for moderate pain. 60 tablet 0  . allopurinol (ZYLOPRIM) 100 MG tablet Take 1 tablet (100 mg total) by mouth daily. 90 tablet 1  . amLODipine (NORVASC) 10 MG tablet Take 1 tablet (10 mg total) by mouth daily. 90 tablet 1  . aspirin 81 MG tablet Take 81 mg by mouth daily. Reported on 09/21/2015    . atorvastatin (LIPITOR) 40 MG tablet Take 40 mg by mouth daily.    . carvedilol (COREG) 6.25 MG tablet Take 1 tablet (6.25 mg total) by mouth 2 (two) times daily with a meal. 180 tablet 1  . colchicine 0.6 MG tablet Take 1 tablet (0.6 mg total) by mouth  daily. 30 tablet 2  . docusate sodium (COLACE) 100 MG capsule Take 100 mg by mouth daily as needed for mild constipation.    . docusate sodium (COLACE) 100 MG capsule Take 100 mg by mouth daily.    . fluticasone (FLONASE) 50 MCG/ACT nasal spray Place 2 sprays into both nostrils daily. 16 g 6  . furosemide (LASIX) 40 MG tablet Take 1 tablet (40 mg total) by mouth 2 (two) times daily. Reported on 06/28/2015 90 tablet 1  . HYDROcodone-acetaminophen (NORCO) 10-325 MG tablet Take 1 tablet by mouth every 6 (six) hours as needed.    . isosorbide mononitrate (IMDUR) 60 MG 24 hr tablet Take 1 tablet (60 mg total) by mouth daily. 90 tablet 1  . losartan (COZAAR) 50 MG tablet take 1 tablet by mouth once daily 30 tablet 6  . nitroGLYCERIN (NITROSTAT) 0.4 MG SL tablet Place 1 tablet (0.4 mg total) under the tongue every 5 (five) minutes as needed for chest pain (chest pain). 20 tablet 1  . omeprazole (PRILOSEC) 20 MG capsule Take 1 capsule (20 mg total) by mouth daily. 90 capsule 3  . predniSONE (DELTASONE) 20 MG tablet take 2 tablets by mouth once daily with BREAKFAST 10 tablet 0  . promethazine (PHENERGAN) 25 MG tablet Take 25 mg by mouth every 8 (eight) hours as needed for nausea or vomiting.    . promethazine (PHENERGAN) 25 MG tablet Take 25 mg by mouth every 8 (eight) hours as needed for nausea or vomiting.    .Marland Kitchen  UNABLE TO FIND Take 600 mg by mouth 1 day or 1 dose. Tylenol 600     Current Facility-Administered Medications on File Prior to Visit  Medication Dose Route Frequency Provider Last Rate Last Dose  . triamcinolone acetonide (KENALOG) 10 MG/ML injection 10 mg  10 mg Other Once [employer], DPM      . triamcinolone acetonide (KENALOG) 10 MG/ML injection 10 mg  10 mg Other Once Asencion Islam, DPM        Allergies  Allergen Reactions  . Aspirin Cough and Other (See Comments)  . Lisinopril Other (See Comments)    Throat itching Throat itching Throat itching    Objective: There were no  vitals filed for this visit.  General: No acute distress, AAOx3  Left foot: Sutures intact with no gapping or dehiscence at surgical site, mild swelling to left foot, no erythema, no warmth, no drainage, no signs of infection noted, Capillary fill time <3 seconds in all digits, gross sensation present via light touch to left foot. No pain or crepitation with range of motion left foot.  No pain with calf compression.   Post Op Xray, Left foot kwire and screw remaining intact with lucency and filling in at osteotomy site.  Assessment and Plan:  Problem List Items Addressed This Visit    None    Visit Diagnoses    Left foot pain    -  Primary   Relevant Orders   DG Foot Complete Left   S/P foot surgery, left          -Patient seen and evaluated -Xrays reviewed -Sutures removed and applied steristrips and stockinet  -Advised patient to shower as normal and to allow steristrips to fall off -Advised patient to continue with Cane and CAM boot left foot x 2 weeks and then slowly transition to good supportive shoes -Advised patient to limit activity to necessity  -Advised patient to ice and elevate as necessary  -Return to office in 3 weeks. In the meantime, patient to call office if any issues or problems arise.   Asencion Islam, DPM

## 2016-03-14 ENCOUNTER — Encounter: Payer: Self-pay | Admitting: Sports Medicine

## 2016-03-14 ENCOUNTER — Ambulatory Visit (INDEPENDENT_AMBULATORY_CARE_PROVIDER_SITE_OTHER): Payer: Self-pay | Admitting: Sports Medicine

## 2016-03-14 DIAGNOSIS — M79672 Pain in left foot: Secondary | ICD-10-CM

## 2016-03-14 DIAGNOSIS — Z9889 Other specified postprocedural states: Secondary | ICD-10-CM

## 2016-03-14 DIAGNOSIS — M21619 Bunion of unspecified foot: Secondary | ICD-10-CM

## 2016-03-14 NOTE — Progress Notes (Signed)
Subjective: Gerald Hodges is a 81 y.o. male patient seen today in office for POV 3 (DOS 01-31-16), S/P hardware removal. Patient denies pain at surgical site, denies calf pain, denies headache, chest pain, shortness of breath, nausea, vomiting, fever, or chills. Patient states that he is doing well with no other issues noted.   Patient Active Problem List   Diagnosis Date Noted  . Low back pain 09/21/2015  . Allergic rhinitis 08/04/2015  . Stable angina (HCC) 06/28/2015  . Bunion of unspecified foot 06/28/2015  . Hypertension 10/09/2014  . Gout 10/09/2014  . Essential hypertension, benign 08/18/2014  . Psoriasis 08/18/2014  . Chronic kidney disease, stage III (moderate) 08/18/2014  . Hyperlipemia 08/18/2014  . CFIDS (chronic fatigue and immune dysfunction syndrome) (HCC) 02/17/2014  . APC (atrial premature contractions) 02/17/2014  . Chronic kidney disease 11/18/2013  . GERD (gastroesophageal reflux disease) 03/19/2013  . CAD (coronary artery disease) 03/18/2013  . Angina pectoris (HCC) 10/09/2012  . Breath shortness 10/09/2012  . Chest pain 10/06/2012  . Creatinine elevation 10/06/2012  . Hyperlipidemia 10/06/2012    Current Outpatient Prescriptions on File Prior to Visit  Medication Sig Dispense Refill  . acetaminophen-codeine (TYLENOL #4) 300-60 MG tablet Take 1 tablet by mouth every 6 (six) hours as needed for moderate pain. 60 tablet 0  . allopurinol (ZYLOPRIM) 100 MG tablet Take 1 tablet (100 mg total) by mouth daily. 90 tablet 1  . amLODipine (NORVASC) 10 MG tablet Take 1 tablet (10 mg total) by mouth daily. 90 tablet 1  . aspirin 81 MG tablet Take 81 mg by mouth daily. Reported on 09/21/2015    . atorvastatin (LIPITOR) 40 MG tablet Take 40 mg by mouth daily.    . carvedilol (COREG) 6.25 MG tablet Take 1 tablet (6.25 mg total) by mouth 2 (two) times daily with a meal. 180 tablet 1  . colchicine 0.6 MG tablet Take 1 tablet (0.6 mg total) by mouth daily. 30 tablet 2  .  docusate sodium (COLACE) 100 MG capsule Take 100 mg by mouth daily as needed for mild constipation.    . docusate sodium (COLACE) 100 MG capsule Take 100 mg by mouth daily.    . fluticasone (FLONASE) 50 MCG/ACT nasal spray Place 2 sprays into both nostrils daily. 16 g 6  . furosemide (LASIX) 40 MG tablet Take 1 tablet (40 mg total) by mouth 2 (two) times daily. Reported on 06/28/2015 90 tablet 1  . HYDROcodone-acetaminophen (NORCO) 10-325 MG tablet Take 1 tablet by mouth every 6 (six) hours as needed.    . isosorbide mononitrate (IMDUR) 60 MG 24 hr tablet Take 1 tablet (60 mg total) by mouth daily. 90 tablet 1  . losartan (COZAAR) 50 MG tablet take 1 tablet by mouth once daily 30 tablet 6  . nitroGLYCERIN (NITROSTAT) 0.4 MG SL tablet Place 1 tablet (0.4 mg total) under the tongue every 5 (five) minutes as needed for chest pain (chest pain). 20 tablet 1  . omeprazole (PRILOSEC) 20 MG capsule Take 1 capsule (20 mg total) by mouth daily. 90 capsule 3  . predniSONE (DELTASONE) 20 MG tablet take 2 tablets by mouth once daily with BREAKFAST 10 tablet 0  . promethazine (PHENERGAN) 25 MG tablet Take 25 mg by mouth every 8 (eight) hours as needed for nausea or vomiting.    . promethazine (PHENERGAN) 25 MG tablet Take 25 mg by mouth every 8 (eight) hours as needed for nausea or vomiting.    Marland Kitchen UNABLE TO FIND Take  600 mg by mouth 1 day or 1 dose. Tylenol 600     Current Facility-Administered Medications on File Prior to Visit  Medication Dose Route Frequency Provider Last Rate Last Dose  . triamcinolone acetonide (KENALOG) 10 MG/ML injection 10 mg  10 mg Other Once [employer], DPM      . triamcinolone acetonide (KENALOG) 10 MG/ML injection 10 mg  10 mg Other Once Asencion Islam, DPM        Allergies  Allergen Reactions  . Aspirin Cough and Other (See Comments)  . Lisinopril Other (See Comments)    Throat itching Throat itching Throat itching    Objective: There were no vitals filed for this  visit.  General: No acute distress, AAOx3  Left foot:Incision healed with no gapping or dehiscence at surgical site, mild swelling to left foot, no erythema, no warmth, no drainage, no signs of infection noted, Capillary fill time <3 seconds in all digits, gross sensation present via light touch to left foot. No pain or crepitation with range of motion left foot.  No pain with calf compression.   Assessment and Plan:  Problem List Items Addressed This Visit    None    Visit Diagnoses    S/P foot surgery, left    -  Primary   Left foot pain       Bunion          -Patient seen and evaluated -Surgical site well healed  -Advised patient to slowly transition to good supportive shoes -Advised patient to limit activity to necessity  -Advised patient to ice and elevate as necessary  -Return to office in 6 weeks for surgical consent for right foot with xrays. In the meantime, patient to call office if any issues or problems arise.   Asencion Islam, DPM

## 2016-03-28 ENCOUNTER — Encounter: Payer: Medicaid Other | Admitting: Unknown Physician Specialty

## 2016-03-28 ENCOUNTER — Other Ambulatory Visit: Payer: Self-pay | Admitting: Sports Medicine

## 2016-04-10 ENCOUNTER — Other Ambulatory Visit: Payer: Self-pay | Admitting: Unknown Physician Specialty

## 2016-04-10 DIAGNOSIS — M1A0720 Idiopathic chronic gout, left ankle and foot, without tophus (tophi): Secondary | ICD-10-CM

## 2016-04-25 ENCOUNTER — Ambulatory Visit (INDEPENDENT_AMBULATORY_CARE_PROVIDER_SITE_OTHER): Payer: Medicaid Other

## 2016-04-25 ENCOUNTER — Ambulatory Visit (INDEPENDENT_AMBULATORY_CARE_PROVIDER_SITE_OTHER): Payer: Self-pay | Admitting: Sports Medicine

## 2016-04-25 ENCOUNTER — Encounter: Payer: Self-pay | Admitting: Sports Medicine

## 2016-04-25 DIAGNOSIS — M216X1 Other acquired deformities of right foot: Secondary | ICD-10-CM

## 2016-04-25 DIAGNOSIS — M21611 Bunion of right foot: Secondary | ICD-10-CM

## 2016-04-25 DIAGNOSIS — Z01818 Encounter for other preprocedural examination: Secondary | ICD-10-CM

## 2016-04-25 NOTE — Progress Notes (Signed)
Subjective: Gerald Hodges is a 81 y.o. male patient seen today in office for POV 4 (DOS 01-31-16), S/P hardware removal on left and is now here for surgery consult for right bunion. No other issues noted.   Patient Active Problem List   Diagnosis Date Noted  . Low back pain 09/21/2015  . Allergic rhinitis 08/04/2015  . Stable angina (HCC) 06/28/2015  . Bunion of unspecified foot 06/28/2015  . Hypertension 10/09/2014  . Gout 10/09/2014  . Essential hypertension, benign 08/18/2014  . Psoriasis 08/18/2014  . Chronic kidney disease, stage III (moderate) 08/18/2014  . Hyperlipemia 08/18/2014  . Chronic fatigue 02/17/2014  . PAC (premature atrial contraction) 02/17/2014  . Chronic kidney disease 11/18/2013  . GERD (gastroesophageal reflux disease) 03/19/2013  . CAD (coronary artery disease) 03/18/2013  . Angina pectoris (HCC) 10/09/2012  . Shortness of breath 10/09/2012  . Chest pain 10/06/2012  . Elevated serum creatinine 10/06/2012  . Hyperlipidemia 10/06/2012    Current Outpatient Prescriptions on File Prior to Visit  Medication Sig Dispense Refill  . acetaminophen-codeine (TYLENOL #4) 300-60 MG tablet Take 1 tablet by mouth every 6 (six) hours as needed for moderate pain. 60 tablet 0  . allopurinol (ZYLOPRIM) 100 MG tablet Take 1 tablet (100 mg total) by mouth daily. 90 tablet 1  . amLODipine (NORVASC) 10 MG tablet Take 1 tablet (10 mg total) by mouth daily. 90 tablet 1  . aspirin 81 MG tablet Take 81 mg by mouth daily. Reported on 09/21/2015    . atorvastatin (LIPITOR) 40 MG tablet Take 40 mg by mouth daily.    . carvedilol (COREG) 6.25 MG tablet Take 1 tablet (6.25 mg total) by mouth 2 (two) times daily with a meal. 180 tablet 1  . colchicine 0.6 MG tablet Take 1 tablet (0.6 mg total) by mouth daily. 30 tablet 2  . docusate sodium (COLACE) 100 MG capsule Take 100 mg by mouth daily as needed for mild constipation.    . docusate sodium (COLACE) 100 MG capsule Take 100 mg by  mouth daily.    . fluticasone (FLONASE) 50 MCG/ACT nasal spray Place 2 sprays into both nostrils daily. 16 g 6  . furosemide (LASIX) 40 MG tablet Take 1 tablet (40 mg total) by mouth 2 (two) times daily. Reported on 06/28/2015 90 tablet 1  . HYDROcodone-acetaminophen (NORCO) 10-325 MG tablet Take 1 tablet by mouth every 6 (six) hours as needed.    . isosorbide mononitrate (IMDUR) 60 MG 24 hr tablet Take 1 tablet (60 mg total) by mouth daily. 90 tablet 1  . losartan (COZAAR) 50 MG tablet take 1 tablet by mouth once daily 30 tablet 6  . nitroGLYCERIN (NITROSTAT) 0.4 MG SL tablet Place 1 tablet (0.4 mg total) under the tongue every 5 (five) minutes as needed for chest pain (chest pain). 20 tablet 1  . omeprazole (PRILOSEC) 20 MG capsule Take 1 capsule (20 mg total) by mouth daily. 90 capsule 3  . predniSONE (DELTASONE) 20 MG tablet take 2 tablets by mouth once daily with BREAKFAST 10 tablet 0  . predniSONE (DELTASONE) 20 MG tablet take 2 tablets by mouth once daily with BREAKFAST 10 tablet 0  . promethazine (PHENERGAN) 25 MG tablet Take 25 mg by mouth every 8 (eight) hours as needed for nausea or vomiting.    . promethazine (PHENERGAN) 25 MG tablet Take 25 mg by mouth every 8 (eight) hours as needed for nausea or vomiting.    Marland Kitchen UNABLE TO FIND Take  600 mg by mouth 1 day or 1 dose. Tylenol 600     Current Facility-Administered Medications on File Prior to Visit  Medication Dose Route Frequency Provider Last Rate Last Dose  . triamcinolone acetonide (KENALOG) 10 MG/ML injection 10 mg  10 mg Other Once [employer], DPM      . triamcinolone acetonide (KENALOG) 10 MG/ML injection 10 mg  10 mg Other Once Asencion Islam, DPM        Allergies  Allergen Reactions  . Aspirin Cough and Other (See Comments)  . Lisinopril Other (See Comments)    Throat itching Throat itching Throat itching   Family History  Problem Relation Age of Onset  . Cancer Mother     stomach   Social History   Social  History  . Marital status: Married    Spouse name: N/A  . Number of children: N/A  . Years of education: N/A   Social History Main Topics  . Smoking status: Never Smoker  . Smokeless tobacco: Never Used  . Alcohol use No  . Drug use: No  . Sexual activity: Not Currently   Other Topics Concern  . None   Social History Narrative  . None   No past surgical history on file.  Objective: There were no vitals filed for this visit.  General: No acute distress, AAOx3  Left foot:Healed with no acute concerns  Right foot: Neurovascular status intact, Grade 4 bunion with hallux pronation with mild pain without crepitation with range of motion  Xray, right foot- increased IM angle consistent with grade 4 bunion and mild arthritis, no other acute findings  Assessment and Plan:  Problem List Items Addressed This Visit    None    Visit Diagnoses    Bunion of right foot    -  Primary   Relevant Orders   DG Foot 2 Views Right   Pre-operative exam          -Patient seen and evaluated -Left Surgical site well healed  -Patient opt for surgical management, right bunion. Consent obtained for right bunionectomy. Pre and Post op course explained. Risks, benefits, alternatives explained. No guarantees given or implied. Surgical booking slip submitted and provided patient with Surgical packet and info for [facility].  -All questions answered with family translating  -To dispense at [facility] CAM Boot to use post op with walker and no weight to right forefoot -Patient to return after surgery. In the meantime, patient to call office if any issues or problems arise.   Asencion Islam, DPM

## 2016-04-25 NOTE — Patient Instructions (Signed)
Pre-Operative Instructions  Congratulations, you have decided to take an important step to improving your quality of life.  You can be assured that the doctors of [facility] will be with you every step of the way.  1. Plan to be at the surgery center/hospital at least 1 (one) hour prior to your scheduled time unless otherwise directed by the surgical center/hospital staff.  You must have a responsible adult accompany you, remain during the surgery and drive you home.  Make sure you have directions to the surgical center/hospital and know how to get there on time. 2. For hospital based surgery you will need to obtain a history and physical form from your family physician within 1 month prior to the date of surgery- we will give you a form for you primary physician.  3. We make every effort to accommodate the date you request for surgery.  There are however, times where surgery dates or times have to be moved.  We will contact you as soon as possible if a change in schedule is required.   4. No Aspirin/Ibuprofen for one week before surgery.  If you are on aspirin, any non-steroidal anti-inflammatory medications (Mobic, Aleve, Ibuprofen) you should stop taking it 7 days prior to your surgery.  You make take Tylenol  For pain prior to surgery.  5. Medications- If you are taking daily heart and blood pressure medications, seizure, reflux, allergy, asthma, anxiety, pain or diabetes medications, make sure the surgery center/hospital is aware before the day of surgery so they may notify you which medications to take or avoid the day of surgery. 6. No food or drink after midnight the night before surgery unless directed otherwise by surgical center/hospital staff. 7. No alcoholic beverages 24 hours prior to surgery.  No smoking 24 hours prior to or 24 hours after surgery. 8. Wear loose pants or shorts- loose enough to fit over bandages, boots, and casts. 9. No slip on shoes, sneakers are best. 10. Bring  your boot with you to the surgery center/hospital.  Also bring crutches or a walker if your physician has prescribed it for you.  If you do not have this equipment, it will be provided for you after surgery. 11. If you have not been contracted by the surgery center/hospital by the day before your surgery, call to confirm the date and time of your surgery. 12. Leave-time from work may vary depending on the type of surgery you have.  Appropriate arrangements should be made prior to surgery with your employer. 13. Prescriptions will be provided immediately following surgery by your doctor.  Have these filled as soon as possible after surgery and take the medication as directed. 14. Remove nail polish on the operative foot. 15. Wash the night before surgery.  The night before surgery wash the foot and leg well with the antibacterial soap provided and water paying special attention to beneath the toenails and in between the toes.  Rinse thoroughly with water and dry well with a towel.  Perform this wash unless told not to do so by your physician.  Enclosed: 1 Ice pack (please put in freezer the night before surgery)   1 Hibiclens skin cleaner   Pre-op Instructions  If you have any questions regarding the instructions, do not hesitate to call our office.  [place]: [address] , [place] [address] [phone]  [place]: [address] [place], [place] [address] [phone]  [place]: [address]  [place], [place] [address] [phone]   Dr.   Norman Regal DPM, Dr. Matthew Wagoner DPM, Dr. M. Todd Hyatt DPM, Dr. Stoney Karczewski DPM 

## 2016-04-28 DIAGNOSIS — J029 Acute pharyngitis, unspecified: Secondary | ICD-10-CM | POA: Diagnosis not present

## 2016-04-28 DIAGNOSIS — R6889 Other general symptoms and signs: Secondary | ICD-10-CM | POA: Diagnosis not present

## 2016-05-08 DIAGNOSIS — I10 Essential (primary) hypertension: Secondary | ICD-10-CM | POA: Diagnosis not present

## 2016-05-08 DIAGNOSIS — M25571 Pain in right ankle and joints of right foot: Secondary | ICD-10-CM | POA: Diagnosis not present

## 2016-05-08 DIAGNOSIS — M2010 Hallux valgus (acquired), unspecified foot: Secondary | ICD-10-CM | POA: Diagnosis not present

## 2016-05-08 DIAGNOSIS — M2040 Other hammer toe(s) (acquired), unspecified foot: Secondary | ICD-10-CM | POA: Diagnosis not present

## 2016-05-09 ENCOUNTER — Telehealth: Payer: Self-pay | Admitting: Sports Medicine

## 2016-05-09 NOTE — Telephone Encounter (Signed)
Post op check phone call made to patient. Patient did not answer. Left voicemail with instructions to call office back if any problems or issues arise. Patient to follow up as scheduled on next week. -Dr. Marylene Land

## 2016-05-16 ENCOUNTER — Ambulatory Visit (INDEPENDENT_AMBULATORY_CARE_PROVIDER_SITE_OTHER): Payer: Medicaid Other

## 2016-05-16 ENCOUNTER — Ambulatory Visit (INDEPENDENT_AMBULATORY_CARE_PROVIDER_SITE_OTHER): Payer: Self-pay | Admitting: Sports Medicine

## 2016-05-16 ENCOUNTER — Encounter: Payer: Self-pay | Admitting: Sports Medicine

## 2016-05-16 DIAGNOSIS — Z9889 Other specified postprocedural states: Secondary | ICD-10-CM

## 2016-05-16 DIAGNOSIS — M79671 Pain in right foot: Secondary | ICD-10-CM

## 2016-05-16 DIAGNOSIS — M2010 Hallux valgus (acquired), unspecified foot: Secondary | ICD-10-CM | POA: Diagnosis not present

## 2016-05-16 NOTE — Progress Notes (Signed)
Subjective: Gerald Hodges is a 81 y.o. male patient seen today in office for POV #1 (DOS 05-08-16), S/P Right closing base wedge bunionectomy and keller arthroplasty. Patient denies pain at surgical site, denies calf pain, denies headache, chest pain, shortness of breath, nausea, vomiting, fever, or chills. Patient states that he is doing well and is only taking tylenol as needed. No other issues noted.   Patient Active Problem List   Diagnosis Date Noted  . Low back pain 09/21/2015  . Allergic rhinitis 08/04/2015  . Stable angina ([place]) 06/28/2015  . Bunion of unspecified foot 06/28/2015  . Hypertension 10/09/2014  . Gout 10/09/2014  . Essential hypertension, benign 08/18/2014  . Psoriasis 08/18/2014  . Chronic kidney disease, stage III (moderate) 08/18/2014  . Hyperlipemia 08/18/2014  . Chronic fatigue 02/17/2014  . PAC (premature atrial contraction) 02/17/2014  . Chronic kidney disease 11/18/2013  . GERD (gastroesophageal reflux disease) 03/19/2013  . CAD (coronary artery disease) 03/18/2013  . Angina pectoris ([place]) 10/09/2012  . Shortness of breath 10/09/2012  . Chest pain 10/06/2012  . Elevated serum creatinine 10/06/2012  . Hyperlipidemia 10/06/2012    Current Outpatient Prescriptions on File Prior to Visit  Medication Sig Dispense Refill  . acetaminophen-codeine (TYLENOL #4) 300-60 MG tablet Take 1 tablet by mouth every 6 (six) hours as needed for moderate pain. 60 tablet 0  . allopurinol (ZYLOPRIM) 100 MG tablet Take 1 tablet (100 mg total) by mouth daily. 90 tablet 1  . amLODipine (NORVASC) 10 MG tablet Take 1 tablet (10 mg total) by mouth daily. 90 tablet 1  . aspirin 81 MG tablet Take 81 mg by mouth daily. Reported on 09/21/2015    . atorvastatin (LIPITOR) 40 MG tablet Take 40 mg by mouth daily.    . carvedilol (COREG) 6.25 MG tablet Take 1 tablet (6.25 mg total) by mouth 2 (two) times daily with a meal. 180 tablet 1  . colchicine 0.6 MG tablet Take 1 tablet (0.6 mg  total) by mouth daily. 30 tablet 2  . docusate sodium (COLACE) 100 MG capsule Take 100 mg by mouth daily as needed for mild constipation.    . docusate sodium (COLACE) 100 MG capsule Take 100 mg by mouth daily.    . fluticasone (FLONASE) 50 MCG/ACT nasal spray Place 2 sprays into both nostrils daily. 16 g 6  . furosemide (LASIX) 40 MG tablet Take 1 tablet (40 mg total) by mouth 2 (two) times daily. Reported on 06/28/2015 90 tablet 1  . HYDROcodone-acetaminophen (NORCO) 10-325 MG tablet Take 1 tablet by mouth every 6 (six) hours as needed.    . isosorbide mononitrate (IMDUR) 60 MG 24 hr tablet Take 1 tablet (60 mg total) by mouth daily. 90 tablet 1  . losartan (COZAAR) 50 MG tablet take 1 tablet by mouth once daily 30 tablet 6  . nitroGLYCERIN (NITROSTAT) 0.4 MG SL tablet Place 1 tablet (0.4 mg total) under the tongue every 5 (five) minutes as needed for chest pain (chest pain). 20 tablet 1  . omeprazole (PRILOSEC) 20 MG capsule Take 1 capsule (20 mg total) by mouth daily. 90 capsule 3  . predniSONE (DELTASONE) 20 MG tablet take 2 tablets by mouth once daily with BREAKFAST 10 tablet 0  . predniSONE (DELTASONE) 20 MG tablet take 2 tablets by mouth once daily with BREAKFAST 10 tablet 0  . promethazine (PHENERGAN) 25 MG tablet Take 25 mg by mouth every 8 (eight) hours as needed for nausea or vomiting.    . promethazine (  PHENERGAN) 25 MG tablet Take 25 mg by mouth every 8 (eight) hours as needed for nausea or vomiting.    Marland Kitchen UNABLE TO FIND Take 600 mg by mouth 1 day or 1 dose. Tylenol 600     Current Facility-Administered Medications on File Prior to Visit  Medication Dose Route Frequency Provider Last Rate Last Dose  . triamcinolone acetonide (KENALOG) 10 MG/ML injection 10 mg  10 mg Other Once [employer], DPM      . triamcinolone acetonide (KENALOG) 10 MG/ML injection 10 mg  10 mg Other Once Landis Martins, DPM        Allergies  Allergen Reactions  . Aspirin Cough and Other (See Comments)  .  Lisinopril Other (See Comments)    Throat itching Throat itching Throat itching    Objective: There were no vitals filed for this visit.  General: No acute distress, AAOx3  Right foot: Kwire and Sutures intact with no gapping or dehiscence at surgical site, mild swelling to right forefoot, no erythema, no warmth, no drainage, no signs of infection noted, Capillary fill time <3 seconds in all digits, gross sensation present via light touch to right foot. No pain or crepitation with range of motion right foot.  No pain with calf compression.   Post Op Xray, Right foot: 1st met Excellent alignment and position. Osteotomy site healing. Hardware intact. Soft tissue swelling within normal limits for post op status.   Assessment and Plan:  Problem List Items Addressed This Visit    None    Visit Diagnoses    S/P foot surgery, right    -  Primary   Relevant Orders   DG Foot Complete Right (Completed)   Right foot pain          -Patient seen and evaluated -Xrays reviewed -Applied dry sterile dressing to surgical site right foot secured with ACE wrap and stockinet  -Advised patient to make sure to keep dressings clean, dry, and intact to right surgical site, removing the ACE as needed  -Advised patient to continue with CAM boot and walker for the next month -Advised patient to limit activity to necessity  -Advised patient to ice and elevate as necessary  -Continue with tylenol and other PRN meds as needed -Will plan for suture removal if ready at next office visit. In the meantime, patient to call office if any issues or problems arise.   Landis Martins, DPM

## 2016-05-23 ENCOUNTER — Ambulatory Visit: Payer: Medicaid Other

## 2016-05-23 ENCOUNTER — Encounter: Payer: Medicaid Other | Admitting: Sports Medicine

## 2016-05-26 ENCOUNTER — Ambulatory Visit (INDEPENDENT_AMBULATORY_CARE_PROVIDER_SITE_OTHER): Payer: Self-pay | Admitting: Sports Medicine

## 2016-05-26 DIAGNOSIS — M7750 Other enthesopathy of unspecified foot: Secondary | ICD-10-CM

## 2016-05-26 DIAGNOSIS — M779 Enthesopathy, unspecified: Principal | ICD-10-CM

## 2016-05-26 DIAGNOSIS — M778 Other enthesopathies, not elsewhere classified: Secondary | ICD-10-CM

## 2016-05-26 DIAGNOSIS — Z9889 Other specified postprocedural states: Secondary | ICD-10-CM

## 2016-05-29 NOTE — Progress Notes (Signed)
Pt presents s/p foot surgery on 2.26.18 to day for suture removal and dressing change. He states that he has mild pain on the plantar forefoot of his Rt foot. Denies fever, chills, nausea.   No active s/s of infection. No drainage from surgical sites. No erythema noted. Noted swelling WNL for post operative status.   Every other suture was removed, wound edges remained aligned and approximated, no gapping. Re-dressed foot with dry sterile gauze. Advised on s/s of infection  Follow up in 1 week to see Dr Marylene Land and to have remaining sutures removed

## 2016-05-30 NOTE — Progress Notes (Signed)
Nurse note reviewed. Patient to follow up as scheduled next week. -Dr. Marylene Land

## 2016-06-01 ENCOUNTER — Other Ambulatory Visit: Payer: Self-pay | Admitting: Unknown Physician Specialty

## 2016-06-01 DIAGNOSIS — M1A0720 Idiopathic chronic gout, left ankle and foot, without tophus (tophi): Secondary | ICD-10-CM

## 2016-06-06 ENCOUNTER — Ambulatory Visit (INDEPENDENT_AMBULATORY_CARE_PROVIDER_SITE_OTHER): Payer: Self-pay | Admitting: Sports Medicine

## 2016-06-06 DIAGNOSIS — M79671 Pain in right foot: Secondary | ICD-10-CM

## 2016-06-06 DIAGNOSIS — Z9889 Other specified postprocedural states: Secondary | ICD-10-CM

## 2016-06-06 NOTE — Progress Notes (Signed)
Subjective: Gerald Hodges is a 81 y.o. male patient seen today in office for POV #3 (DOS 05-08-16), S/P Right closing base wedge bunionectomy and keller arthroplasty. Patient denies pain at surgical site, denies calf pain, denies headache, chest pain, shortness of breath, nausea, vomiting, fever, or chills. Patient states that he is doing well and is only taking tylenol as needed and using boot. No other issues noted.   Patient Active Problem List   Diagnosis Date Noted  . Low back pain 09/21/2015  . Allergic rhinitis 08/04/2015  . Stable angina ([facility]) 06/28/2015  . Bunion of unspecified foot 06/28/2015  . Hypertension 10/09/2014  . Gout 10/09/2014  . Essential hypertension, benign 08/18/2014  . Psoriasis 08/18/2014  . Chronic kidney disease, stage III (moderate) 08/18/2014  . Hyperlipemia 08/18/2014  . Chronic fatigue 02/17/2014  . PAC (premature atrial contraction) 02/17/2014  . Chronic kidney disease 11/18/2013  . GERD (gastroesophageal reflux disease) 03/19/2013  . CAD (coronary artery disease) 03/18/2013  . Angina pectoris ([facility]) 10/09/2012  . Shortness of breath 10/09/2012  . Chest pain 10/06/2012  . Elevated serum creatinine 10/06/2012  . Hyperlipidemia 10/06/2012    Current Outpatient Prescriptions on File Prior to Visit  Medication Sig Dispense Refill  . acetaminophen-codeine (TYLENOL #4) 300-60 MG tablet Take 1 tablet by mouth every 6 (six) hours as needed for moderate pain. 60 tablet 0  . allopurinol (ZYLOPRIM) 100 MG tablet Take 1 tablet (100 mg total) by mouth daily. 90 tablet 1  . amLODipine (NORVASC) 10 MG tablet Take 1 tablet (10 mg total) by mouth daily. 90 tablet 1  . aspirin 81 MG tablet Take 81 mg by mouth daily. Reported on 09/21/2015    . atorvastatin (LIPITOR) 40 MG tablet Take 40 mg by mouth daily.    . carvedilol (COREG) 6.25 MG tablet Take 1 tablet (6.25 mg total) by mouth 2 (two) times daily with a meal. 180 tablet 1  . colchicine 0.6 MG tablet Take 1  tablet (0.6 mg total) by mouth daily. 30 tablet 2  . docusate sodium (COLACE) 100 MG capsule Take 100 mg by mouth daily as needed for mild constipation.    . docusate sodium (COLACE) 100 MG capsule Take 100 mg by mouth daily.    . fluticasone (FLONASE) 50 MCG/ACT nasal spray Place 2 sprays into both nostrils daily. 16 g 6  . furosemide (LASIX) 40 MG tablet Take 1 tablet (40 mg total) by mouth 2 (two) times daily. Reported on 06/28/2015 90 tablet 1  . HYDROcodone-acetaminophen (NORCO) 10-325 MG tablet Take 1 tablet by mouth every 6 (six) hours as needed.    . isosorbide mononitrate (IMDUR) 60 MG 24 hr tablet Take 1 tablet (60 mg total) by mouth daily. 90 tablet 1  . losartan (COZAAR) 50 MG tablet take 1 tablet by mouth once daily 30 tablet 6  . nitroGLYCERIN (NITROSTAT) 0.4 MG SL tablet Place 1 tablet (0.4 mg total) under the tongue every 5 (five) minutes as needed for chest pain (chest pain). 20 tablet 1  . omeprazole (PRILOSEC) 20 MG capsule Take 1 capsule (20 mg total) by mouth daily. 90 capsule 3  . predniSONE (DELTASONE) 20 MG tablet take 2 tablets by mouth once daily with BREAKFAST 10 tablet 0  . predniSONE (DELTASONE) 20 MG tablet take 2 tablets by mouth once daily WITH BREAKFAST. 10 tablet 0  . promethazine (PHENERGAN) 25 MG tablet Take 25 mg by mouth every 8 (eight) hours as needed for nausea or vomiting.    Marland Kitchen  promethazine (PHENERGAN) 25 MG tablet Take 25 mg by mouth every 8 (eight) hours as needed for nausea or vomiting.    Marland Kitchen UNABLE TO FIND Take 600 mg by mouth 1 day or 1 dose. Tylenol 600     Current Facility-Administered Medications on File Prior to Visit  Medication Dose Route Frequency Provider Last Rate Last Dose  . triamcinolone acetonide (KENALOG) 10 MG/ML injection 10 mg  10 mg Other Once [employer], DPM      . triamcinolone acetonide (KENALOG) 10 MG/ML injection 10 mg  10 mg Other Once Asencion Islam, DPM        Allergies  Allergen Reactions  . Aspirin Cough and Other  (See Comments)  . Lisinopril Other (See Comments)    Throat itching Throat itching Throat itching    Objective: There were no vitals filed for this visit.  General: No acute distress, AAOx3  Right foot: Kwire and Sutures intact with no gapping but a little dehiscence centrally at surgical site, mild swelling to right forefoot, no erythema, no warmth, no drainage, no signs of infection noted, Capillary fill time <3 seconds in all digits, gross sensation present via light touch to right foot. No pain or crepitation with range of motion right foot.  No pain with calf compression.    Assessment and Plan:  Problem List Items Addressed This Visit    None    Visit Diagnoses    S/P foot surgery, right    -  Primary   Right foot pain          -Patient seen and evaluated -Applied dermabond, steristrips and dry sterile dressing to surgical site right foot secured with ACE wrap and stockinet  -Advised patient to make sure to keep dressings clean, dry, and intact to right surgical site, removing the ACE as needed  -Advised patient to continue with CAM boot and walker  -Advised patient to limit activity to necessity  -Advised patient to ice and elevate as necessary  -Continue with tylenol and other PRN meds as needed -Will plan for xray, k-wire removal and suture removal if ready at next office visit. In the meantime, patient to call office if any issues or problems arise.   Asencion Islam, DPM

## 2016-06-13 ENCOUNTER — Ambulatory Visit (INDEPENDENT_AMBULATORY_CARE_PROVIDER_SITE_OTHER): Payer: Medicaid Other | Admitting: Sports Medicine

## 2016-06-13 ENCOUNTER — Ambulatory Visit (INDEPENDENT_AMBULATORY_CARE_PROVIDER_SITE_OTHER): Payer: Medicaid Other

## 2016-06-13 ENCOUNTER — Encounter: Payer: Medicaid Other | Admitting: Unknown Physician Specialty

## 2016-06-13 DIAGNOSIS — Z9889 Other specified postprocedural states: Secondary | ICD-10-CM

## 2016-06-13 DIAGNOSIS — M2041 Other hammer toe(s) (acquired), right foot: Secondary | ICD-10-CM

## 2016-06-13 DIAGNOSIS — M21611 Bunion of right foot: Secondary | ICD-10-CM

## 2016-06-13 NOTE — Progress Notes (Signed)
Subjective: Gerald Hodges is a 81 y.o. male patient seen today in office for POV #4 (DOS 05-08-16), S/P Right closing base wedge bunionectomy and keller arthroplasty. Patient denies pain at surgical site, denies calf pain, denies headache, chest pain, shortness of breath, nausea, vomiting, fever, or chills. Patient states that he is doing well and is only taking tylenol as needed and using boot. No other issues noted.   Patient Active Problem List   Diagnosis Date Noted  . Low back pain 09/21/2015  . Allergic rhinitis 08/04/2015  . Stable angina ([facility]) 06/28/2015  . Bunion of unspecified foot 06/28/2015  . Hypertension 10/09/2014  . Gout 10/09/2014  . Essential hypertension, benign 08/18/2014  . Psoriasis 08/18/2014  . Chronic kidney disease, stage III (moderate) 08/18/2014  . Hyperlipemia 08/18/2014  . Chronic fatigue 02/17/2014  . PAC (premature atrial contraction) 02/17/2014  . Chronic kidney disease 11/18/2013  . GERD (gastroesophageal reflux disease) 03/19/2013  . CAD (coronary artery disease) 03/18/2013  . Angina pectoris ([facility]) 10/09/2012  . Shortness of breath 10/09/2012  . Chest pain 10/06/2012  . Elevated serum creatinine 10/06/2012  . Hyperlipidemia 10/06/2012    Current Outpatient Prescriptions on File Prior to Visit  Medication Sig Dispense Refill  . acetaminophen-codeine (TYLENOL #4) 300-60 MG tablet Take 1 tablet by mouth every 6 (six) hours as needed for moderate pain. 60 tablet 0  . allopurinol (ZYLOPRIM) 100 MG tablet Take 1 tablet (100 mg total) by mouth daily. 90 tablet 1  . amLODipine (NORVASC) 10 MG tablet Take 1 tablet (10 mg total) by mouth daily. 90 tablet 1  . aspirin 81 MG tablet Take 81 mg by mouth daily. Reported on 09/21/2015    . atorvastatin (LIPITOR) 40 MG tablet Take 40 mg by mouth daily.    . carvedilol (COREG) 6.25 MG tablet Take 1 tablet (6.25 mg total) by mouth 2 (two) times daily with a meal. 180 tablet 1  . colchicine 0.6 MG tablet Take 1  tablet (0.6 mg total) by mouth daily. 30 tablet 2  . docusate sodium (COLACE) 100 MG capsule Take 100 mg by mouth daily as needed for mild constipation.    . docusate sodium (COLACE) 100 MG capsule Take 100 mg by mouth daily.    . fluticasone (FLONASE) 50 MCG/ACT nasal spray Place 2 sprays into both nostrils daily. 16 g 6  . furosemide (LASIX) 40 MG tablet Take 1 tablet (40 mg total) by mouth 2 (two) times daily. Reported on 06/28/2015 90 tablet 1  . HYDROcodone-acetaminophen (NORCO) 10-325 MG tablet Take 1 tablet by mouth every 6 (six) hours as needed.    . isosorbide mononitrate (IMDUR) 60 MG 24 hr tablet Take 1 tablet (60 mg total) by mouth daily. 90 tablet 1  . losartan (COZAAR) 50 MG tablet take 1 tablet by mouth once daily 30 tablet 6  . nitroGLYCERIN (NITROSTAT) 0.4 MG SL tablet Place 1 tablet (0.4 mg total) under the tongue every 5 (five) minutes as needed for chest pain (chest pain). 20 tablet 1  . omeprazole (PRILOSEC) 20 MG capsule Take 1 capsule (20 mg total) by mouth daily. 90 capsule 3  . predniSONE (DELTASONE) 20 MG tablet take 2 tablets by mouth once daily with BREAKFAST 10 tablet 0  . predniSONE (DELTASONE) 20 MG tablet take 2 tablets by mouth once daily WITH BREAKFAST. 10 tablet 0  . promethazine (PHENERGAN) 25 MG tablet Take 25 mg by mouth every 8 (eight) hours as needed for nausea or vomiting.    Marland Kitchen  promethazine (PHENERGAN) 25 MG tablet Take 25 mg by mouth every 8 (eight) hours as needed for nausea or vomiting.    Marland Kitchen UNABLE TO FIND Take 600 mg by mouth 1 day or 1 dose. Tylenol 600     Current Facility-Administered Medications on File Prior to Visit  Medication Dose Route Frequency Provider Last Rate Last Dose  . triamcinolone acetonide (KENALOG) 10 MG/ML injection 10 mg  10 mg Other Once [employer], DPM      . triamcinolone acetonide (KENALOG) 10 MG/ML injection 10 mg  10 mg Other Once Asencion Islam, DPM        Allergies  Allergen Reactions  . Aspirin Cough and Other  (See Comments)  . Lisinopril Other (See Comments)    Throat itching Throat itching Throat itching    Objective: There were no vitals filed for this visit.  General: No acute distress, AAOx3  Right foot: Kwire and Sutures intact with no gapping but a little dehiscence centrally at surgical site, mild swelling to right forefoot, no erythema, no warmth, no drainage, no signs of infection noted, Capillary fill time <3 seconds in all digits, gross sensation present via light touch to right foot. No pain or crepitation with range of motion right foot.  No pain with calf compression.    Xrays, right foot- hardware intact with loosening of kwire resembling ready for removal, s/p bunion surgery.   Assessment and Plan:  Problem List Items Addressed This Visit    None    Visit Diagnoses    Bunion of right foot    -  Primary   Relevant Orders   DG Foot Complete Right (Completed)   S/P foot surgery, right       Relevant Orders   DG Foot Complete Right (Completed)      -Patient seen and evaluated -Xrays reviewed -Kwire and remaining sutures were removed -Applied steristrips and dry sterile dressing to surgical site right foot secured with ACE wrap and stockinet  -Advised patient to make sure to keep dressings clean, dry, and intact to right surgical site, removing the ACE as needed  -Advised patient to continue with CAM boot and walker x 2 weeks  -Advised patient to limit activity to necessity  -Advised patient to ice and elevate as necessary  -Continue with tylenol and other PRN meds as needed -Will plan for checking of healing of incision site and transitioning out of boot if incision is healed next visit. In the meantime, patient to call office if any issues or problems arise.   Asencion Islam, DPM

## 2016-06-21 ENCOUNTER — Ambulatory Visit: Payer: Medicaid Other | Admitting: Unknown Physician Specialty

## 2016-06-27 ENCOUNTER — Ambulatory Visit (INDEPENDENT_AMBULATORY_CARE_PROVIDER_SITE_OTHER): Payer: Medicaid Other | Admitting: Sports Medicine

## 2016-06-27 ENCOUNTER — Encounter: Payer: Self-pay | Admitting: Sports Medicine

## 2016-06-27 DIAGNOSIS — Z9889 Other specified postprocedural states: Secondary | ICD-10-CM

## 2016-06-27 DIAGNOSIS — M21611 Bunion of right foot: Secondary | ICD-10-CM

## 2016-06-27 DIAGNOSIS — M79671 Pain in right foot: Secondary | ICD-10-CM

## 2016-06-27 MED ORDER — DOCUSATE SODIUM 100 MG PO CAPS: 100.0000 mg | ORAL_CAPSULE | Freq: Two times a day (BID) | ORAL | 0 refills | Status: DC

## 2016-06-27 NOTE — Progress Notes (Signed)
Subjective: Gerald Hodges is a 81 y.o. male patient seen today in office for POV #5 (DOS 05-08-16), S/P Right closing base wedge bunionectomy and keller arthroplasty. Patient denies pain at surgical site, denies calf pain, denies headache, chest pain, shortness of breath, nausea, vomiting, fever, or chills. Patient states that he is doing well and is only taking tylenol as needed and using boot. No other issues noted.   Patient Active Problem List   Diagnosis Date Noted  . Low back pain 09/21/2015  . Allergic rhinitis 08/04/2015  . Stable angina (HCC) 06/28/2015  . Bunion of unspecified foot 06/28/2015  . Hypertension 10/09/2014  . Gout 10/09/2014  . Essential hypertension, benign 08/18/2014  . Psoriasis 08/18/2014  . Chronic kidney disease, stage III (moderate) 08/18/2014  . Hyperlipemia 08/18/2014  . Chronic fatigue 02/17/2014  . PAC (premature atrial contraction) 02/17/2014  . Chronic kidney disease 11/18/2013  . GERD (gastroesophageal reflux disease) 03/19/2013  . CAD (coronary artery disease) 03/18/2013  . Angina pectoris (HCC) 10/09/2012  . Shortness of breath 10/09/2012  . Chest pain 10/06/2012  . Elevated serum creatinine 10/06/2012  . Hyperlipidemia 10/06/2012    Current Outpatient Prescriptions on File Prior to Visit  Medication Sig Dispense Refill  . acetaminophen-codeine (TYLENOL #4) 300-60 MG tablet Take 1 tablet by mouth every 6 (six) hours as needed for moderate pain. 60 tablet 0  . allopurinol (ZYLOPRIM) 100 MG tablet Take 1 tablet (100 mg total) by mouth daily. 90 tablet 1  . amLODipine (NORVASC) 10 MG tablet Take 1 tablet (10 mg total) by mouth daily. 90 tablet 1  . aspirin 81 MG tablet Take 81 mg by mouth daily. Reported on 09/21/2015    . atorvastatin (LIPITOR) 40 MG tablet Take 40 mg by mouth daily.    . carvedilol (COREG) 6.25 MG tablet Take 1 tablet (6.25 mg total) by mouth 2 (two) times daily with a meal. 180 tablet 1  . colchicine 0.6 MG tablet Take 1  tablet (0.6 mg total) by mouth daily. 30 tablet 2  . docusate sodium (COLACE) 100 MG capsule Take 100 mg by mouth daily as needed for mild constipation.    . docusate sodium (COLACE) 100 MG capsule Take 100 mg by mouth daily.    . fluticasone (FLONASE) 50 MCG/ACT nasal spray Place 2 sprays into both nostrils daily. 16 g 6  . furosemide (LASIX) 40 MG tablet Take 1 tablet (40 mg total) by mouth 2 (two) times daily. Reported on 06/28/2015 90 tablet 1  . HYDROcodone-acetaminophen (NORCO) 10-325 MG tablet Take 1 tablet by mouth every 6 (six) hours as needed.    . isosorbide mononitrate (IMDUR) 60 MG 24 hr tablet Take 1 tablet (60 mg total) by mouth daily. 90 tablet 1  . losartan (COZAAR) 50 MG tablet take 1 tablet by mouth once daily 30 tablet 6  . nitroGLYCERIN (NITROSTAT) 0.4 MG SL tablet Place 1 tablet (0.4 mg total) under the tongue every 5 (five) minutes as needed for chest pain (chest pain). 20 tablet 1  . omeprazole (PRILOSEC) 20 MG capsule Take 1 capsule (20 mg total) by mouth daily. 90 capsule 3  . predniSONE (DELTASONE) 20 MG tablet take 2 tablets by mouth once daily with BREAKFAST 10 tablet 0  . predniSONE (DELTASONE) 20 MG tablet take 2 tablets by mouth once daily WITH BREAKFAST. 10 tablet 0  . promethazine (PHENERGAN) 25 MG tablet Take 25 mg by mouth every 8 (eight) hours as needed for nausea or vomiting.    Marland Kitchen  promethazine (PHENERGAN) 25 MG tablet Take 25 mg by mouth every 8 (eight) hours as needed for nausea or vomiting.    Marland Kitchen UNABLE TO FIND Take 600 mg by mouth 1 day or 1 dose. Tylenol 600     Current Facility-Administered Medications on File Prior to Visit  Medication Dose Route Frequency Provider Last Rate Last Dose  . triamcinolone acetonide (KENALOG) 10 MG/ML injection 10 mg  10 mg Other Once [employer], DPM      . triamcinolone acetonide (KENALOG) 10 MG/ML injection 10 mg  10 mg Other Once Asencion Islam, DPM        Allergies  Allergen Reactions  . Aspirin Cough and Other  (See Comments)  . Lisinopril Other (See Comments)    Throat itching Throat itching Throat itching    Objective: There were no vitals filed for this visit.  General: No acute distress, AAOx3  Right foot: Completely healed at surgical site, mild swelling to right forefoot, no erythema, no warmth, no drainage, no signs of infection noted, Capillary fill time <3 seconds in all digits, gross sensation present via light touch to right foot. No pain or crepitation with range of motion right foot.  No pain with calf compression.    Assessment and Plan:  Problem List Items Addressed This Visit    None    Visit Diagnoses    S/P foot surgery, right    -  Primary   Bunion of right foot       Right foot pain          -Patient seen and evaluated -Applied steristrips and dry sterile dressing to surgical site right foot secured with ACE wrap and stockinet  -Advised patient to shower as normal on tomorrow allowing strips to fall off -Advised patient to wean from CAM boot to normal tennis shoe as instructed -Advised patient to limit activity to necessity  -Advised patient to ice and elevate as necessary  -Continue with tylenol and other PRN meds as needed; Refilled Colace -Will plan for increasing actitivites next visit. In the meantime, patient to call office if any issues or problems arise.   Asencion Islam, DPM

## 2016-07-21 ENCOUNTER — Encounter: Payer: Self-pay | Admitting: Unknown Physician Specialty

## 2016-07-21 ENCOUNTER — Ambulatory Visit (INDEPENDENT_AMBULATORY_CARE_PROVIDER_SITE_OTHER): Payer: Medicaid Other | Admitting: Unknown Physician Specialty

## 2016-07-21 VITALS — BP 138/82 | HR 69 | Ht 66.0 in | Wt 191.0 lb

## 2016-07-21 DIAGNOSIS — Z23 Encounter for immunization: Secondary | ICD-10-CM

## 2016-07-21 DIAGNOSIS — I10 Essential (primary) hypertension: Secondary | ICD-10-CM

## 2016-07-21 DIAGNOSIS — E785 Hyperlipidemia, unspecified: Secondary | ICD-10-CM

## 2016-07-21 DIAGNOSIS — M109 Gout, unspecified: Secondary | ICD-10-CM

## 2016-07-21 DIAGNOSIS — Z0000 Encounter for general adult medical examination without abnormal findings: Secondary | ICD-10-CM

## 2016-07-21 DIAGNOSIS — M1710 Unilateral primary osteoarthritis, unspecified knee: Secondary | ICD-10-CM | POA: Insufficient documentation

## 2016-07-21 DIAGNOSIS — I2510 Atherosclerotic heart disease of native coronary artery without angina pectoris: Secondary | ICD-10-CM

## 2016-07-21 DIAGNOSIS — M170 Bilateral primary osteoarthritis of knee: Secondary | ICD-10-CM

## 2016-07-21 DIAGNOSIS — N183 Chronic kidney disease, stage 3 unspecified: Secondary | ICD-10-CM

## 2016-07-21 DIAGNOSIS — M179 Osteoarthritis of knee, unspecified: Secondary | ICD-10-CM | POA: Insufficient documentation

## 2016-07-21 LAB — MICROSCOPIC EXAMINATION
Bacteria, UA: NONE SEEN
WBC, UA: NONE SEEN /hpf (ref 0–?)

## 2016-07-21 LAB — UA/M W/RFLX CULTURE, ROUTINE
Bilirubin, UA: NEGATIVE
Glucose, UA: NEGATIVE
Ketones, UA: NEGATIVE
Leukocytes, UA: NEGATIVE
Nitrite, UA: NEGATIVE
RBC, UA: NEGATIVE
Specific Gravity, UA: 1.02 (ref 1.005–1.030)
Urobilinogen, Ur: 1 mg/dL (ref 0.2–1.0)
pH, UA: 6.5 (ref 5.0–7.5)

## 2016-07-21 MED ORDER — AMLODIPINE BESYLATE 10 MG PO TABS: 10.0000 mg | ORAL_TABLET | Freq: Every day | ORAL | 1 refills | Status: DC

## 2016-07-21 MED ORDER — FUROSEMIDE 40 MG PO TABS: 40.0000 mg | ORAL_TABLET | Freq: Two times a day (BID) | ORAL | 1 refills | Status: DC

## 2016-07-21 MED ORDER — FLUTICASONE PROPIONATE 50 MCG/ACT NA SUSP: 2.0000 | Freq: Every day | NASAL | 6 refills | Status: DC

## 2016-07-21 MED ORDER — CARVEDILOL 6.25 MG PO TABS: 6.2500 mg | ORAL_TABLET | Freq: Two times a day (BID) | ORAL | 1 refills | Status: DC

## 2016-07-21 MED ORDER — COLCHICINE 0.6 MG PO TABS: 0.6000 mg | ORAL_TABLET | Freq: Every day | ORAL | 2 refills | Status: DC

## 2016-07-21 MED ORDER — ALLOPURINOL 100 MG PO TABS: 100.0000 mg | ORAL_TABLET | Freq: Every day | ORAL | 1 refills | Status: DC

## 2016-07-21 NOTE — Assessment & Plan Note (Signed)
Stable, continue present medications.   

## 2016-07-21 NOTE — Assessment & Plan Note (Signed)
?   If Atorvastatin is contributing to leg pain.  However, reluctant to stop it due to CAD

## 2016-07-21 NOTE — Assessment & Plan Note (Signed)
Needs an appointment with their [facility] cardiologist

## 2016-07-21 NOTE — Assessment & Plan Note (Signed)
Refer to Orthopedics for further evaluation

## 2016-07-21 NOTE — Patient Instructions (Addendum)
Gota (Gout) La gota es una hinchazn dolorosa que puede ocurrir en alguna de las articulaciones. La gota es un tipo de artritis. Esta afeccin es causada por la acumulacin de cido rico en el cuerpo. El cido rico es una sustancia qumica que se forma cuando el cuerpo descompone sustancias llamadas purinas. Las purinas son importantes para la fabricacin de las protenas del cuerpo. Cuando el cuerpo tiene un exceso de cido rico, se pueden formar cristales con punta y Production manageracumularse en el interior de las articulaciones. Esto provoca dolor e hinchazn. Los ataques de gota pueden ocurrir rpidamente y ser muy dolorosos (gota [place]). Con el tiempo, las crisis pueden afectar ms articulaciones y volverse ms frecuentes (gota crnica). La gota tambin puede provocar la acumulacin de cido rico debajo de la piel y en los riones. CAUSAS Esta afeccin es causada por la acumulacin de cido rico en la sangre. Esto puede ocurrir por los siguientes motivos:  Los riones no eliminan la cantidad suficiente de cido rico del cuerpo. Esta es la causa ms frecuente.  El cuerpo produce demasiado cido rico. Esto puede ocurrir con algunos tipos de cncer y tratamientos Water quality scientistcontra el cncer. Tambin puede ocurrir si el cuerpo est descomponiendo demasiados glbulos rojos (anemia hemoltica).  Come demasiados alimentos ricos en purinas. Estos alimentos incluyen vsceras y Agilent Technologiesalgunos mariscos. El alcohol, en especial la cerveza, tambin es rica en purinas. Neomia DearUna crisis de gota puede desencadenarse debido a un traumatismo o Librarian, academicestrs. FACTORES DE RIESGO Es ms probable que esta afeccin se manifieste en las personas que:  Tienen antecedentes familiares de gota.  Son hombres de [place] edad.  Son mujeres que han atravesado la menopausia.  Son obesas.  Beben alcohol con frecuencia, en especial, cerveza.  Estn deshidratadas.  Pierden peso con demasiada rapidez.  Le han hecho un trasplante de un rgano.  Se han  intoxicado con plomo.  Usan determinados medicamentos, como la aspirina, la ciclosporina, diurticos, la levodopa y la niacina.  Tienen enfermedad renal o psoriasis. SNTOMAS Un ataque de gota aguda sucede de repente. Normalmente ocurre solo en Risk analystuna articulacin. El lugar ms comn es el pulgar del pie. Las crisis a menudo comienzan por la noche. Tambin pueden verse afectadas las articulaciones de los pies, los tobillos, las rodillas, los dedos de la mano, las muecas o los codos. Entre los sntomas se pueden incluir los siguientes:  Dolor intenso.  Calor.  Hinchazn.  Rigidez.  Dolor a Insurance claims handlerla palpacin. Se puede sentir mucho dolor al tacto en la articulacin afectada.  Piel brillosa, roja o morada.  Grant RutsFiebre y escalofros. La gota crnica puede provocar sntomas con ms frecuencia. Pueden verse involucradas ms articulaciones. Es posible que tambin tenga bultos blancos o amarillos (tofos) en las manos o los pies, o en otras zonas cercanas a las articulaciones. DIAGNSTICO Esta afeccin se diagnostica en funcin de los sntomas, los antecedentes mdicos y un examen fsico. Pueden hacerle estudios, por ejemplo:  Anlisis de sangre para Foot Lockermedir los niveles de cido rico.  Psychologist, counsellingxtraccin de lquido sinovial con una aguja (aspiracin) para Engineer, manufacturingdetectar la presencia de cristales de cido rico.  Radiografas para observar la articulacin daada. TRATAMIENTO El tratamiento para esta afeccin tiene dos fases: tratar un ataque agudo y prevenir ataques futuros. El tratamiento de la gota aguda puede incluir medicamentos para reducir Chief Technology Officerel dolor y la hinchazn, por ejemplo:  Antiinflamatorios no esteroides.  Corticoides. Estos son medicamentos antiinflamatorios fuertes que se pueden tomar va oral o se pueden Insurance claims handlerinyectar en una articulacin.  Colchicina. Este medicamento Estée Lauderalivia el  dolor y la hinchazn cuando se [place] inmediatamente despus de Neomia Dear crisis. Puede administrarse por va oral o intravenosa (IV). El  tratamiento preventivo puede incluir lo siguiente:  El uso diario de dosis ms bajas de AINE o colchicina.  El uso de un medicamento que reduce los niveles de cido rico en la [place].  Cambios en la dieta. Quiz deba consultar a un especialista en alimentacin saludable (nutricionista). INSTRUCCIONES PARA EL CUIDADO EN EL HOGAR Durante una crisis de gota  Si se lo indican, aplique hielo en la zona afectada:  Ponga el hielo en una bolsa plstica.  Coloque una toalla entre la piel y la bolsa de hielo.  Coloque el hielo durante 20 minutos, 2 a 3 veces por da.  Haga reposo todo el tiempo que pueda. Si la articulacin afectada se encuentra en la pierna, quiz deba usar muletas.  Levante (eleve) la zona lesionada por encima del nivel del corazn tantas veces como le sea posible.  Beba suficiente lquido para Photographer orina clara o de color amarillo plido.  Tome los medicamentos de venta libre y los recetados solamente como se lo haya indicado el mdico.  No conduzca ni opere maquinaria pesada mientras toma analgsicos recetados.  Siga las indicaciones del mdico respecto de las restricciones para las comidas o las bebidas.  Reanude sus actividades normales como se lo haya indicado el mdico. Pregntele al mdico qu actividades son seguras para usted. Cmo evitar futuras crisis de gota  Siga una dieta baja en purinas como se lo haya indicado el nutricionista o el mdico. Evite alimentos y bebidas con alto contenido de purinas, por ejemplo, hgado, rin, anchoas, esprragos, arenque, hongos, mejillones y Financial controller.  Limite el consumo de alcohol a no ms de 1 medida por da si es mujer y no est Orthoptist, y 2 medidas por da si es hombre. Una medida equivale a 12onzas de cerveza, 5onzas de vino o 1onzas de bebidas alcohlicas de alta graduacin.  Mantenga un peso saludable o pierda peso si tiene sobrepeso. Si quiere perder peso, hable con el mdico. Es importante que no pierda  peso demasiado rpido.  Comience o siga un programa de ejercicios como se lo haya indicado el mdico.  Beba suficiente lquido para mantener la orina clara o de color amarillo plido.  Tome los medicamentos de venta libre y los recetados solamente como se lo haya indicado el mdico.  Oceanographer a todas las visitas de control como se lo haya indicado el mdico. Esto es importante. SOLICITE ATENCIN MDICA SI:  Foye Deer otra crisis de gota.  Sigue teniendo sntomas de gota despus de 10das de tratamiento.  Los [employer] causan efectos secundarios.  Tiene escalofros o fiebre.  Tiene sensacin de [employer] al [employer].  Siente dolor en la parte inferior de la espalda o en el abdomen. SOLICITE ATENCIN MDICA DE INMEDIATO SI:  Siente dolor intenso o incontrolable.  No puede orinar. Esta informacin no tiene Theme park manager el consejo del mdico. Asegrese de hacerle al mdico cualquier pregunta que tenga. Document Released: 12/07/2004 Document Revised: 06/21/2015 Document Reviewed: 12/10/2014 Elsevier Interactive Patient Education  2017 [employer]. Dieta con bajo contenido de purinas (Low-Purine Diet) Las purinas son compuestos que influyen en el nivel de cido rico en el cuerpo. Una dieta con bajo contenido de purinas es aquella que tiene un bajo nivel de [place]. El consumo de una dieta con bajo contenido de purinas puede evitar que el nivel de cido rico del cuerpo se eleve demasiado y  cause gota o clculos renales, o ambas cosas. QU DEBO SABER ACERCA DE ESTA DIETA?  Elija alimentos con bajo contenido de purinas. En la siguiente seccin, se incluyen ejemplos de alimentos con bajo contenido de purinas.  [employer] lquido, Hotel manager. El lquido puede ayudar a Land cido rico del cuerpo. Intente tomar de 8a 16vasos (1,9l a 3,8l) por da.  Limite el consumo de alimentos con alto contenido de grasa, en especial de grasas saturadas, ya que la  grasa hace que el cuerpo tenga ms dificultad para eliminar el cido rico. Los alimentos con alto contenido de grasas saturadas incluyen pizza, queso, helado, [place] entera, alimentos fritos y salsas. Elija alimentos con bajo contenido de [place] y fuentes de protenas magras. Al cocinar, elija aceite de oliva, ya que contiene grasas saludables con bajo contenido de grasas saturadas.  Limite el consumo de bebidas alcohlicas. El alcohol interfiere en la eliminacin de cido rico del cuerpo. Si est teniendo [place] crisis de gota, evite consumir alcohol.  Tenga en cuenta que el cuerpo de cada persona reacciona diferente a los distintos alimentos. Con el tiempo, es probable que reconozca los alimentos que lo afectan o no. Si descubre que un alimento tiende a causarle una exacerbacin de la gota, evite comerlo. Puede disfrutar libremente de los alimentos que no le causan problemas. Si tiene [place] pregunta sobre un alimento especfico, hable con el nutricionista o el mdico. QU ALIMENTOS TIENEN CONTENIDO [place], [place] Y ALTO DE PURINAS? La siguiente lista incluye alimentos con contenido bajo, moderado y alto de purinas. Puede comer la cantidad que desee de los alimentos con bajo contenido de purinas. Tambin puede consumir bajas cantidades de alimentos con contenido moderado de purinas. Consulte al mdico la cantidad de alimentos con contenido moderado de purinas que puede consumir. Evite los alimentos con alto contenido de purinas. Cereales  Alimentos con bajo contenido de purinas: Pan con harina blanca enriquecida, pastas, arroz, pasteles, pan de maz, palomitas de maz.  Alimentos con contenido moderado de purinas: Medical laboratory scientific officer y panes integrales, germen de trigo, salvado, avena. Avena no cocida. Germen de trigo o salvado de trigo deshidratados.  Alimentos con alto contenido de purinas: Panqueques, tostadas francesas, bizcochos, bollitos. Vegetales  Alimentos con bajo contenido de purinas: Todas las  verduras, excepto las que tienen contenido moderado de purinas.  Alimentos con contenido moderado de purinas: Esprragos, coliflor, espinaca, championes, guisantes. Frutas  Todas las frutas tienen bajo contenido de purinas. Carnes y otras fuentes de protenas.  Alimentos con bajo contenido de purinas: Huevos, frutos secos, [place] de man.  Alimentos con [employer] de purinas: Carne de vaca 80% o 90% magra, cordero, ternera, cerdo, aves, pescado, [place], [place] de man, frutos secos. [place], [place], ostras y camarones. Lentejas, frijoles y guisantes secos y cocidos.  Alimentos con [employer] de purinas: Anchoas, sardinas, arenque, mejillones, atn, bacalao, vieiras, trucha y abadejo. Panceta. Vsceras (como hgado o riones). Mondongo. Carne de caza. Ganso. Mollejas. Lcteos  Murphy Oil tienen bajo contenido de purinas. Los lcteos sin grasa o con bajo contenido de grasa son mejores, porque tienen pocas grasas saturadas. Bebidas  Bebidas con bajo contenido de purinas: Agua, bebidas gaseosas, t, caf, cacao.  Bebidas con contenido moderado de purinas: Bjorn Loser y otras bebidas endulzadas con Doreen Beam de maz con alto contenido de fructosa. Jugos. Para determinar si una bebida o un alimento estn endulzados con jarabe de maz con alto contenido de fructosa, fjese en la lista de ingredientes.  Bebidas con alto contenido de purinas: Bebidas alcohlicas (  por ejemplo, cerveza). Condimentos  Alimentos con bajo contenido de purinas: Sal, hierbas, aceitunas, pickles, salsas, vinagre.  Alimentos con [employer] de purinas: [place], [place], [place], [place]. Grasas y [employer] con bajo contenido de purinas: Todos los tipos, excepto las salsas preparadas con carne.  Alimentos con alto contenido de purinas: Salsas preparadas con carne. Otros alimentos  Alimentos con bajo contenido de purinas: Azcares, dulces, gelatina. Pasteles. Sopas  elaboradas sin carne.  Alimentos con contenido moderado de purinas: [place] o caldos a base de carne o de pescado. Bebidas y alimentos endulzados con jarabe de maz con alto contenido de fructosa.  Alimentos con [employer] de purinas: Postres con alto contenido de grasa (por ejemplo, helado, galletas, pasteles, tartas, donas y chocolate). Comunquese con el nutricionista para obtener ms informacin sobre los alimentos que no estn incluidos. Esta informacin no tiene Theme park manager el consejo del mdico. Asegrese de hacerle al mdico cualquier pregunta que tenga. Document Released: 11/22/2011 Document Revised: 03/04/2013 Document Reviewed: 02/03/2013 Elsevier Interactive Patient Education  2017 [employer].

## 2016-07-21 NOTE — Assessment & Plan Note (Signed)
Check CMP.  ?

## 2016-07-21 NOTE — Assessment & Plan Note (Signed)
Continue Allopurinol.  Check Uric Acid.  Try to get approval for Colchicine.  Diet information given

## 2016-07-21 NOTE — Progress Notes (Signed)
BP 138/82   Pulse 69   Ht 5\' 6"  (1.676 m)   Wt 191 lb (86.6 kg)   SpO2 97%   BMI 30.83 kg/m    Subjective:    Patient ID: Gerald Hodges, male    DOB: Jan 25, 1936, 81 y.o.   MRN: [id number]  HPI: Gerald Hodges is a 81 y.o. male  Chief Complaint  Patient presents with  . Annual Exam  . Knee Pain    Left. Chronic   Pt is here with son and interpreter and gives the following history:  leg pain CC is bilateral leg pain.  He is complaining of pain in his knee, but also in his upper legs.    States his legs hurt a lot.  States the pain in knees and legs are so severe he has to rest after walking for 30 minutes.  Feels knees crushing with walking.   Gout Suffers gout bilateral feet.  Takes Albuterol.  Medicaid is not paying for the colchicine.  He has multiple gout attacks requiring the use of steroids.  Pt does not drink.  He is wondering if something in his diet can help.    Hypertension Using medications without difficulty Average home BPs Not checking   No problems or lightheadedness No chest pain with exertion or shortness of breath No Edema   Hyperlipidemia Using medications without problems No Muscle aches  Diet compliance:Questions about his diet Exercise: little due to leg pain  CAD No chest pain or SOB.  Overdue for cardiology visit.    Functional Status Survey: Is the patient deaf or have difficulty hearing?: Yes Does the patient have difficulty seeing, even when wearing glasses/contacts?: No Does the patient have difficulty concentrating, remembering, or making decisions?: Yes Does the patient have difficulty walking or climbing stairs?: Yes Does the patient have difficulty dressing or bathing?: No Does the patient have difficulty doing errands alone such as visiting a doctor's office or shopping?: Yes  Depression screen PHQ 2/9 07/21/2016  Decreased Interest 0  Down, Depressed, Hopeless 0  PHQ - 2 Score 0   Fall Risk  07/21/2016  Falls in the past  year? No     Family History  Problem Relation Age of Onset  . Cancer Mother        stomach   Social History   Social History  . Marital status: Married    Spouse name: N/A  . Number of children: N/A  . Years of education: N/A   Occupational History  . Not on file.   Social History Main Topics  . Smoking status: Never Smoker  . Smokeless tobacco: Never Used  . Alcohol use No  . Drug use: No  . Sexual activity: Not Currently   Other Topics Concern  . Not on file   Social History Narrative  . No narrative on file     Relevant past medical, surgical, family and social history reviewed and updated as indicated. Interim medical history since our last visit reviewed. Allergies and medications reviewed and updated.  Review of Systems  Per HPI unless specifically indicated above     Objective:    BP 138/82   Pulse 69   Ht 5\' 6"  (1.676 m)   Wt 191 lb (86.6 kg)   SpO2 97%   BMI 30.83 kg/m   Wt Readings from Last 3 Encounters:  07/21/16 191 lb (86.6 kg)  09/21/15 186 lb 6.4 oz (84.6 kg)  08/04/15 183 lb 6.4 oz (83.2 kg)  Physical Exam  Constitutional: He is oriented to person, place, and time. He appears well-developed and well-nourished. No distress.  HENT:  Head: Normocephalic and atraumatic.  Eyes: Conjunctivae and lids are normal. Right eye exhibits no discharge. Left eye exhibits no discharge. No scleral icterus.  Neck: Normal range of motion. Neck supple. No JVD present. Carotid bruit is not present.  Cardiovascular: Normal rate, regular rhythm and normal heart sounds.   Pulmonary/Chest: Effort normal and breath sounds normal. No respiratory distress.  Abdominal: Normal appearance. There is no splenomegaly or hepatomegaly.  Musculoskeletal: Normal range of motion.  Neurological: He is alert and oriented to person, place, and time.  Skin: Skin is warm, dry and intact. No rash noted. No pallor.  Psychiatric: He has a normal mood and affect. His behavior  is normal. Judgment and thought content normal.    Assessment & Plan:   Problem List Items Addressed This Visit      Unprioritized   CAD (coronary artery disease)    Needs an appointment with their [facility] cardiologist      Relevant Medications   furosemide (LASIX) 40 MG tablet   carvedilol (COREG) 6.25 MG tablet   amLODipine (NORVASC) 10 MG tablet   Chronic kidney disease, stage III (moderate)    Check CMP      Essential hypertension, benign   Relevant Medications   furosemide (LASIX) 40 MG tablet   carvedilol (COREG) 6.25 MG tablet   amLODipine (NORVASC) 10 MG tablet   Other Relevant Orders   Comprehensive metabolic panel   UA/M w/rflx Culture, Routine   Gout    Continue Allopurinol.  Check Uric Acid.  Try to get approval for Colchicine.  Diet information given      Relevant Medications   colchicine 0.6 MG tablet   allopurinol (ZYLOPRIM) 100 MG tablet   Other Relevant Orders   Uric acid   Hyperlipidemia    ? If Atorvastatin is contributing to leg pain.  However, reluctant to stop it due to CAD      Relevant Medications   furosemide (LASIX) 40 MG tablet   carvedilol (COREG) 6.25 MG tablet   amLODipine (NORVASC) 10 MG tablet   Other Relevant Orders   Comprehensive metabolic panel   Lipid panel   UA/M w/rflx Culture, Routine   Knee osteoarthritis    Refer to Orthopedics for further evaluation      Relevant Medications   colchicine 0.6 MG tablet   allopurinol (ZYLOPRIM) 100 MG tablet   Other Relevant Orders   Ambulatory referral to Orthopedic Surgery    Other Visit Diagnoses    Annual physical exam    -  Primary   Relevant Orders   Comprehensive metabolic panel   Lipid panel   UA/M w/rflx Culture, Routine   Chronic kidney disease, stage 3       Relevant Medications   furosemide (LASIX) 40 MG tablet   Need for pneumococcal vaccination       Relevant Orders   Pneumococcal polysaccharide vaccine 23-valent greater than or equal to 2yo subcutaneous/IM    Essential hypertension       Relevant Medications   furosemide (LASIX) 40 MG tablet   carvedilol (COREG) 6.25 MG tablet   amLODipine (NORVASC) 10 MG tablet       Follow up plan: Return in about 6 months (around 01/21/2017).   Greater than 50% of this  25 minute visit was spent in counseling/coordination of care regarding

## 2016-07-22 ENCOUNTER — Other Ambulatory Visit: Payer: Self-pay | Admitting: Unknown Physician Specialty

## 2016-07-22 DIAGNOSIS — M109 Gout, unspecified: Secondary | ICD-10-CM

## 2016-07-22 LAB — LIPID PANEL
Chol/HDL Ratio: 2.8 ratio (ref 0.0–5.0)
Cholesterol, Total: 90 mg/dL — ABNORMAL LOW (ref 100–199)
HDL: 32 mg/dL — ABNORMAL LOW (ref >39)
LDL Calculated: 33 mg/dL (ref 0–99)
Triglycerides: 127 mg/dL (ref 0–149)
VLDL Cholesterol Cal: 25 mg/dL (ref 5–40)

## 2016-07-22 LAB — COMPREHENSIVE METABOLIC PANEL
ALT: 22 IU/L (ref 0–44)
AST: 25 IU/L (ref 0–40)
Albumin/Globulin Ratio: 1.7 (ref 1.2–2.2)
Albumin: 4.5 g/dL (ref 3.5–4.8)
Alkaline Phosphatase: 92 IU/L (ref 39–117)
BUN/Creatinine Ratio: 17 (ref 10–24)
BUN: 23 mg/dL (ref 8–27)
Bilirubin Total: 0.7 mg/dL (ref 0.0–1.2)
CO2: 26 mmol/L (ref 18–29)
Calcium: 9.5 mg/dL (ref 8.6–10.2)
Chloride: 101 mmol/L (ref 96–106)
Creatinine, Ser: 1.37 mg/dL — ABNORMAL HIGH (ref 0.76–1.27)
GFR calc Af Amer: 57 mL/min/{1.73_m2} — ABNORMAL LOW (ref >59)
GFR calc non Af Amer: 49 mL/min/{1.73_m2} — ABNORMAL LOW (ref >59)
Globulin, Total: 2.7 g/dL (ref 1.5–4.5)
Glucose: 97 mg/dL (ref 65–99)
Potassium: 4.8 mmol/L (ref 3.5–5.2)
Sodium: 141 mmol/L (ref 134–144)
Total Protein: 7.2 g/dL (ref 6.0–8.5)

## 2016-07-22 LAB — URIC ACID: Uric Acid: 9.3 mg/dL — ABNORMAL HIGH (ref 3.7–8.6)

## 2016-07-24 ENCOUNTER — Telehealth: Payer: Self-pay | Admitting: Unknown Physician Specialty

## 2016-07-24 DIAGNOSIS — N183 Chronic kidney disease, stage 3 unspecified: Secondary | ICD-10-CM

## 2016-07-24 NOTE — Telephone Encounter (Signed)
Discussed with son low GFR.  Needs appt with Nephrology

## 2016-07-24 NOTE — Progress Notes (Signed)
Patient notified of results by phone.

## 2016-07-25 ENCOUNTER — Ambulatory Visit (INDEPENDENT_AMBULATORY_CARE_PROVIDER_SITE_OTHER): Payer: Medicaid Other | Admitting: Sports Medicine

## 2016-07-25 ENCOUNTER — Encounter: Payer: Self-pay | Admitting: Sports Medicine

## 2016-07-25 DIAGNOSIS — M21611 Bunion of right foot: Secondary | ICD-10-CM

## 2016-07-25 DIAGNOSIS — Z9889 Other specified postprocedural states: Secondary | ICD-10-CM

## 2016-07-25 DIAGNOSIS — M79671 Pain in right foot: Secondary | ICD-10-CM

## 2016-07-25 NOTE — Progress Notes (Signed)
Subjective: Gerald Hodges is a 81 y.o. male patient seen today in office for POV #6 (DOS 05-08-16), S/P Right closing base wedge bunionectomy and keller arthroplasty. Patient denies pain at surgical site, denies calf pain, denies headache, chest pain, shortness of breath, nausea, vomiting, fever, or chills. Patient states that he is doing well however does notice swelling in feet after long hours and some pain to ball of foot. No other issues noted.   Patient Active Problem List   Diagnosis Date Noted  . Knee osteoarthritis 07/21/2016  . Low back pain 09/21/2015  . Allergic rhinitis 08/04/2015  . Stable angina ([place]) 06/28/2015  . Bunion of unspecified foot 06/28/2015  . Gout 10/09/2014  . Essential hypertension, benign 08/18/2014  . Psoriasis 08/18/2014  . Chronic kidney disease, stage III (moderate) 08/18/2014  . Chronic fatigue 02/17/2014  . PAC (premature atrial contraction) 02/17/2014  . Chronic kidney disease 11/18/2013  . GERD (gastroesophageal reflux disease) 03/19/2013  . CAD (coronary artery disease) 03/18/2013  . Angina pectoris ([place]) 10/09/2012  . Hyperlipidemia 10/06/2012    Current Outpatient Prescriptions on File Prior to Visit  Medication Sig Dispense Refill  . acetaminophen-codeine (TYLENOL #4) 300-60 MG tablet Take 1 tablet by mouth every 6 (six) hours as needed for moderate pain. 60 tablet 0  . allopurinol (ZYLOPRIM) 100 MG tablet take 1 tablet by mouth once daily 150 tablet 0  . amLODipine (NORVASC) 10 MG tablet Take 1 tablet (10 mg total) by mouth daily. 90 tablet 1  . aspirin 81 MG tablet Take 81 mg by mouth daily. Reported on 09/21/2015    . atorvastatin (LIPITOR) 40 MG tablet Take 40 mg by mouth daily.    . carvedilol (COREG) 6.25 MG tablet Take 1 tablet (6.25 mg total) by mouth 2 (two) times daily with a meal. 180 tablet 1  . colchicine 0.6 MG tablet Take 1 tablet (0.6 mg total) by mouth daily. 30 tablet 2  . docusate sodium (COLACE) 100 MG capsule Take 1  capsule (100 mg total) by mouth 2 (two) times daily. 10 capsule 0  . fluticasone (FLONASE) 50 MCG/ACT nasal spray Place 2 sprays into both nostrils daily. 16 g 6  . furosemide (LASIX) 40 MG tablet Take 1 tablet (40 mg total) by mouth 2 (two) times daily. Reported on 06/28/2015 90 tablet 1  . HYDROcodone-acetaminophen (NORCO) 10-325 MG tablet Take 1 tablet by mouth every 6 (six) hours as needed.    . isosorbide mononitrate (IMDUR) 60 MG 24 hr tablet Take 1 tablet (60 mg total) by mouth daily. 90 tablet 1  . losartan (COZAAR) 50 MG tablet take 1 tablet by mouth once daily 30 tablet 6  . nitroGLYCERIN (NITROSTAT) 0.4 MG SL tablet Place 1 tablet (0.4 mg total) under the tongue every 5 (five) minutes as needed for chest pain (chest pain). 20 tablet 1  . omeprazole (PRILOSEC) 20 MG capsule Take 1 capsule (20 mg total) by mouth daily. 90 capsule 3  . predniSONE (DELTASONE) 20 MG tablet take 2 tablets by mouth once daily WITH BREAKFAST. 10 tablet 0  . promethazine (PHENERGAN) 25 MG tablet Take 25 mg by mouth every 8 (eight) hours as needed for nausea or vomiting.    Marland Kitchen UNABLE TO FIND Take 600 mg by mouth 1 day or 1 dose. Tylenol 600     Current Facility-Administered Medications on File Prior to Visit  Medication Dose Route Frequency Provider Last Rate Last Dose  . triamcinolone acetonide (KENALOG) 10 MG/ML injection 10 mg  10 mg Other Once Landis Martins, DPM      . triamcinolone acetonide (KENALOG) 10 MG/ML injection 10 mg  10 mg Other Once Landis Martins, DPM        Allergies  Allergen Reactions  . Aspirin Cough and Other (See Comments)  . Lisinopril Other (See Comments)    Throat itching Throat itching Throat itching    Objective: There were no vitals filed for this visit.  General: No acute distress, AAOx3  Right foot: Completely healed at surgical site, mild swelling to right forefoot, no erythema, no warmth, no drainage, no signs of infection noted, Capillary fill time <3 seconds in all  digits, gross sensation present via light touch to right foot. No pain or crepitation with range of motion right foot. Subjective pain to ball with weightbearing.  No pain with calf compression.    Assessment and Plan:  Problem List Items Addressed This Visit    None    Visit Diagnoses    S/P foot surgery, right    -  Primary   Bunion of right foot       Right foot pain          -Patient seen and evaluated -Applied met pad to shoe liner and advised patient to get OTC insoles and wider shoes -Dispense anklet for edema control  -Continue with tylenol and other PRN meds as needed -Will plan for final xrays at next visit and d/c from post op care. In the meantime, patient to call office if any issues or problems arise.   Landis Martins, DPM

## 2016-07-26 DIAGNOSIS — M170 Bilateral primary osteoarthritis of knee: Secondary | ICD-10-CM | POA: Diagnosis not present

## 2016-08-02 DIAGNOSIS — I5030 Unspecified diastolic (congestive) heart failure: Secondary | ICD-10-CM | POA: Insufficient documentation

## 2016-08-17 NOTE — Progress Notes (Signed)
1. Right bunionectomy with internal fixation

## 2016-09-07 ENCOUNTER — Other Ambulatory Visit: Payer: Self-pay | Admitting: Nephrology

## 2016-09-07 DIAGNOSIS — N183 Chronic kidney disease, stage 3 unspecified: Secondary | ICD-10-CM

## 2016-09-07 DIAGNOSIS — I10 Essential (primary) hypertension: Secondary | ICD-10-CM | POA: Diagnosis not present

## 2016-09-19 ENCOUNTER — Ambulatory Visit: Payer: Medicaid Other

## 2016-09-20 ENCOUNTER — Ambulatory Visit (INDEPENDENT_AMBULATORY_CARE_PROVIDER_SITE_OTHER): Payer: Medicaid Other | Admitting: Unknown Physician Specialty

## 2016-09-20 ENCOUNTER — Encounter: Payer: Self-pay | Admitting: Unknown Physician Specialty

## 2016-09-20 VITALS — BP 136/80 | HR 69 | Temp 98.7°F | Wt 187.6 lb

## 2016-09-20 DIAGNOSIS — Z01818 Encounter for other preprocedural examination: Secondary | ICD-10-CM

## 2016-09-20 MED ORDER — DOCUSATE SODIUM 100 MG PO CAPS: 100.0000 mg | ORAL_CAPSULE | Freq: Two times a day (BID) | ORAL | 0 refills | Status: DC

## 2016-09-20 MED ORDER — OMEPRAZOLE 20 MG PO CPDR: 20.0000 mg | DELAYED_RELEASE_CAPSULE | Freq: Every day | ORAL | 3 refills | Status: DC

## 2016-09-20 NOTE — Progress Notes (Signed)
BP 136/80   Pulse 69   Temp 98.7 F (37.1 C)   Wt 187 lb 9.6 oz (85.1 kg)   SpO2 97%   BMI 30.28 kg/m    Subjective:    Patient ID: Gerald Hodges, male    DOB: 1935-10-13, 81 y.o.   MRN: [id number]  HPI: Gerald Hodges is a 81 y.o. male  Chief Complaint  Patient presents with  . Surgery Clearance    pt needs clearance for a left knee surgery    Pre-op appointment for knee replacement. No chest pain or SOB.  He was walking a lot until knee would no longer allow him.  He is fairly sedentary now  Recently saw his cardiologist in May and followed every 6 months  Relevant past medical, surgical, family and social history reviewed and updated as indicated. Interim medical history since our last visit reviewed. Allergies and medications reviewed and updated.  Review of Systems  Constitutional: Negative.   Respiratory: Negative.   Cardiovascular: Negative.   Musculoskeletal: Negative.   Psychiatric/Behavioral: Negative.     Per HPI unless specifically indicated above     Objective:    BP 136/80   Pulse 69   Temp 98.7 F (37.1 C)   Wt 187 lb 9.6 oz (85.1 kg)   SpO2 97%   BMI 30.28 kg/m   Wt Readings from Last 3 Encounters:  09/20/16 187 lb 9.6 oz (85.1 kg)  07/21/16 191 lb (86.6 kg)  09/21/15 186 lb 6.4 oz (84.6 kg)    Physical Exam  Constitutional: He is oriented to person, place, and time. He appears well-developed and well-nourished. No distress.  HENT:  Head: Normocephalic and atraumatic.  Eyes: Conjunctivae and lids are normal. Right eye exhibits no discharge. Left eye exhibits no discharge. No scleral icterus.  Neck: Normal range of motion. Neck supple. No JVD present. Carotid bruit is not present.  Cardiovascular: Normal rate, regular rhythm and normal heart sounds.   Pulmonary/Chest: Effort normal and breath sounds normal. No respiratory distress.  Abdominal: Normal appearance. There is no splenomegaly or hepatomegaly.  Musculoskeletal: Normal range  of motion.  Neurological: He is alert and oriented to person, place, and time.  Skin: Skin is warm, dry and intact. No rash noted. No pallor.  Psychiatric: He has a normal mood and affect. His behavior is normal. Judgment and thought content normal.   EKG without change from previous.  NSR with non specific T abnormality  Results for orders placed or performed in visit on 07/21/16  Microscopic Examination  Result Value Ref Range   WBC, UA None seen 0 - 5 /hpf   RBC, UA 0-2 0 - 2 /hpf   Epithelial Cells (non renal) CANCELED    Bacteria, UA None seen None seen/Few  Comprehensive metabolic panel  Result Value Ref Range   Glucose 97 65 - 99 mg/dL   BUN 23 8 - 27 mg/dL   Creatinine, Ser 6.94 (H) 0.76 - 1.27 mg/dL   GFR calc non Af Amer 49 (L) >59 mL/min/1.73   GFR calc Af Amer 57 (L) >59 mL/min/1.73   BUN/Creatinine Ratio 17 10 - 24   Sodium 141 134 - 144 mmol/L   Potassium 4.8 3.5 - 5.2 mmol/L   Chloride 101 96 - 106 mmol/L   CO2 26 18 - 29 mmol/L   Calcium 9.5 8.6 - 10.2 mg/dL   Total Protein 7.2 6.0 - 8.5 g/dL   Albumin 4.5 3.5 - 4.8 g/dL   Globulin, Total  2.7 1.5 - 4.5 g/dL   Albumin/Globulin Ratio 1.7 1.2 - 2.2   Bilirubin Total 0.7 0.0 - 1.2 mg/dL   Alkaline Phosphatase 92 39 - 117 IU/L   AST 25 0 - 40 IU/L   ALT 22 0 - 44 IU/L  Lipid panel  Result Value Ref Range   Cholesterol, Total 90 (L) 100 - 199 mg/dL   Triglycerides 811 0 - 149 mg/dL   HDL 32 (L) >91 mg/dL   VLDL Cholesterol Cal 25 5 - 40 mg/dL   LDL Calculated 33 0 - 99 mg/dL   Chol/HDL Ratio 2.8 0.0 - 5.0 ratio  UA/M w/rflx Culture, Routine  Result Value Ref Range   Specific Gravity, UA 1.020 1.005 - 1.030   pH, UA 6.5 5.0 - 7.5   Color, UA Yellow Yellow   Appearance Ur Clear Clear   Leukocytes, UA Negative Negative   Protein, UA Trace (A) Negative/Trace   Glucose, UA Negative Negative   Ketones, UA Negative Negative   RBC, UA Negative Negative   Bilirubin, UA Negative Negative   Urobilinogen, Ur 1.0  0.2 - 1.0 mg/dL   Nitrite, UA Negative Negative   Microscopic Examination See below:   Uric acid  Result Value Ref Range   Uric Acid 9.3 (H) 3.7 - 8.6 mg/dL      Assessment & Plan:   Problem List Items Addressed This Visit    None    Visit Diagnoses    Pre-operative clearance    -  Primary   OK for surgery.  Moderate risk due to CAD.  Monitored every 6 months by cardiology and stable   Relevant Orders   EKG 12-Lead (Completed)       Follow up plan: Return if symptoms worsen or fail to improve.

## 2016-09-26 ENCOUNTER — Ambulatory Visit: Payer: Medicaid Other | Admitting: Sports Medicine

## 2016-09-28 ENCOUNTER — Telehealth: Payer: Self-pay | Admitting: Unknown Physician Specialty

## 2016-09-28 NOTE — Telephone Encounter (Signed)
Form and OV note printed and refaxed to [facility]

## 2016-10-09 ENCOUNTER — Telehealth: Payer: Self-pay | Admitting: Unknown Physician Specialty

## 2016-10-09 NOTE — Telephone Encounter (Signed)
Called and left patient's son a VM asking for him to please return my call. According to care everywhere in chart, patient sees Dr. Angelita Ingles in [place], phone number [phone].

## 2016-10-09 NOTE — Telephone Encounter (Signed)
Patient's son returned my call and was given the patient's cardiologist name and phone number.

## 2016-10-09 NOTE — Telephone Encounter (Signed)
Patient' son would like to name and phone number for referral to cardiologist in [place], [place].   Please Advise.  Thank you

## 2016-10-11 DIAGNOSIS — M179 Osteoarthritis of knee, unspecified: Secondary | ICD-10-CM | POA: Insufficient documentation

## 2016-10-13 ENCOUNTER — Other Ambulatory Visit: Payer: Self-pay | Admitting: Specialist

## 2016-10-16 ENCOUNTER — Telehealth: Payer: Self-pay | Admitting: Unknown Physician Specialty

## 2016-10-16 ENCOUNTER — Encounter
Admission: RE | Admit: 2016-10-16 | Discharge: 2016-10-16 | Disposition: A | Discharge status: Home / Self Care | Payer: Medicaid Other | Source: Ambulatory Visit | Attending: Specialist | Admitting: Specialist

## 2016-10-16 DIAGNOSIS — Z01818 Encounter for other preprocedural examination: Secondary | ICD-10-CM | POA: Insufficient documentation

## 2016-10-16 HISTORY — DX: Gout, unspecified: M10.9

## 2016-10-16 HISTORY — DX: Atherosclerotic heart disease of native coronary artery without angina pectoris: I25.10

## 2016-10-16 HISTORY — DX: Transient cerebral ischemic attack, unspecified: G45.9

## 2016-10-16 HISTORY — DX: Gastro-esophageal reflux disease without esophagitis: K21.9

## 2016-10-16 HISTORY — DX: Chronic kidney disease, unspecified: N18.9

## 2016-10-16 LAB — URINALYSIS, ROUTINE W REFLEX MICROSCOPIC
Bilirubin Urine: NEGATIVE
Glucose, UA: NEGATIVE mg/dL
Hgb urine dipstick: NEGATIVE
Ketones, ur: NEGATIVE mg/dL
Leukocytes, UA: NEGATIVE
Nitrite: NEGATIVE
Protein, ur: NEGATIVE mg/dL
Specific Gravity, Urine: 1.01 (ref 1.005–1.030)
pH: 6 (ref 5.0–8.0)

## 2016-10-16 LAB — COMPREHENSIVE METABOLIC PANEL
ALT: 22 U/L (ref 17–63)
AST: 21 U/L (ref 15–41)
Albumin: 4.4 g/dL (ref 3.5–5.0)
Alkaline Phosphatase: 64 U/L (ref 38–126)
Anion gap: 9 (ref 5–15)
BUN: 26 mg/dL — ABNORMAL HIGH (ref 6–20)
CO2: 27 mmol/L (ref 22–32)
Calcium: 10 mg/dL (ref 8.9–10.3)
Chloride: 104 mmol/L (ref 101–111)
Creatinine, Ser: 1.38 mg/dL — ABNORMAL HIGH (ref 0.61–1.24)
GFR calc Af Amer: 55 mL/min — ABNORMAL LOW (ref >60)
GFR calc non Af Amer: 47 mL/min — ABNORMAL LOW (ref >60)
Glucose, Bld: 100 mg/dL — ABNORMAL HIGH (ref 65–99)
Potassium: 3.8 mmol/L (ref 3.5–5.1)
Sodium: 140 mmol/L (ref 135–145)
Total Bilirubin: 1.2 mg/dL (ref 0.3–1.2)
Total Protein: 7.3 g/dL (ref 6.5–8.1)

## 2016-10-16 LAB — APTT: aPTT: 29 seconds (ref 24–36)

## 2016-10-16 LAB — CBC WITH DIFFERENTIAL/PLATELET
Basophils Absolute: 0 10*3/uL (ref 0–0.1)
Basophils Relative: 1 %
Eosinophils Absolute: 0 10*3/uL (ref 0–0.7)
Eosinophils Relative: 1 %
HCT: 44.2 % (ref 40.0–52.0)
Hemoglobin: 15.1 g/dL (ref 13.0–18.0)
Lymphocytes Relative: 27 %
Lymphs Abs: 2.1 10*3/uL (ref 1.0–3.6)
MCH: 31.1 pg (ref 26.0–34.0)
MCHC: 34.1 g/dL (ref 32.0–36.0)
MCV: 91.3 fL (ref 80.0–100.0)
Monocytes Absolute: 0.8 10*3/uL (ref 0.2–1.0)
Monocytes Relative: 11 %
Neutro Abs: 4.7 10*3/uL (ref 1.4–6.5)
Neutrophils Relative %: 60 %
Platelets: 282 10*3/uL (ref 150–440)
RBC: 4.84 MIL/uL (ref 4.40–5.90)
RDW: 14.3 % (ref 11.5–14.5)
WBC: 7.6 10*3/uL (ref 3.8–10.6)

## 2016-10-16 LAB — SURGICAL PCR SCREEN
MRSA, PCR: NEGATIVE
Staphylococcus aureus: NEGATIVE

## 2016-10-16 LAB — TYPE AND SCREEN
ABO/RH(D): O POS
Antibody Screen: NEGATIVE

## 2016-10-16 LAB — PROTIME-INR
INR: 1.03
Prothrombin Time: 13.5 seconds (ref 11.4–15.2)

## 2016-10-16 NOTE — Telephone Encounter (Signed)
OV note and EKG printed and faxed to [facility]. Called and let Cordelia Pen know that this was being sent now.

## 2016-10-16 NOTE — Patient Instructions (Addendum)
Your procedure is scheduled on: October 25, 2016 [facility]) Su procedimiento est programado para: Report to [facility], SAME DAY SURGERY, SECOND FLOOR Presntese a: To find out your arrival time please call [phone] between 1PM - 3PM on October 24, 2016 (TUESDAY ) Para saber su hora de llegada por favor llame al [phone] entre la 1PM - 3PM el da:  Remember: Instructions that are not followed completely may result in serious medical risk, up to and including death, or upon the discretion of your surgeon and anesthesiologist your surgery may need to be rescheduled.  Recuerde: Las instrucciones que no se siguen completamente Armed forces logistics/support/administrative officer en un riesgo de salud grave, incluyendo hasta la [place] o a discrecin de su cirujano y Scientific laboratory technician, su ciruga se puede posponer.   _X___ 1. Do not eat food or drink liquids after midnight. No gum chewing or hard candies.  No coma alimentos ni tome lquidos despus de la medianoche.  No mastique chicle ni caramelos  duros.     __X__ 2. No alcohol for 24 hours before or after surgery.    No tome alcohol durante las 24 horas antes ni despus de la [place].   ____ 3. Bring all medications with you on the day of surgery if instructed.    Lleve todos los medicamentos con usted el da de su ciruga si se le ha indicado as.   _X___ 4. Notify your doctor if there is any change in your medical condition (cold, fever,                             infections).    Informe a su mdico si hay algn cambio en su condicin mdica (resfriado, fiebre, infecciones).   Do not wear jewelry, make-up, hairpins, clips or nail polish.  No use joyas, maquillajes, pinzas/ganchos para el cabello ni esmalte de uas.  Do not wear lotions, powders, or perfumes.  No use lociones, polvos o perfumes.     Do not shave 48 hours prior to surgery. Men may shave face and neck.  No se afeite 48 horas antes de la [place].  Los hombres pueden [employer] cara y el cuello.   Do  not bring valuables to the hospital.   No lleve objetos de valor al hospital.  [facility] is not responsible for any belongings or valuables.  [place] no se hace responsable de ningn tipo de pertenencias u objetos de Licensed conveyancer.               Contacts, dentures or bridgework may not be worn into surgery.  Los lentes de [place], las dentaduras postizas o puentes no se pueden usar en la [place].  Leave your suitcase in the car. After surgery it may be brought to your room.  Deje su maleta en el auto.  Despus de la ciruga podr traerla a su habitacin.  For patients admitted to the hospital, discharge time is determined by your treatment team.  Para los pacientes que sean ingresados al hospital, el tiempo en el cual se le dar de alta es determinado por su                equipo de [place].   Patients discharged the day of surgery will not be allowed to drive home. A los pacientes que se les da de alta el mismo da de la ciruga no se les permitir conducir a Higher education careers adviser.   Please read over the following  fact sheets that you were given: Por favor lea las siguientes hojas de informacin que le dieron:      _x___ Take these medicines the morning of surgery with A SIP OF WATER:          [employer] estas medicinas la maana de la ciruga con UN SORBO DE AGUA:  1. OMEPRAZOLE  2. ATORVASTATIN  3. AMLODIPINE  4.  CARVEDILOL     5.  6.  ____ Fleet Enema (as directed)          Enema de Fleet (segn lo indicado)    _X___ Use CHG Soap as directed          Utilice el jabn de CHG segn lo indicado  ____ Use inhalers on the day of surgery          Use los inhaladores el da de la ciruga  ____ Stop metformin 2 days prior to surgery          Deje de tomar el metformin 2 das antes de la [place]    ____ Take 1/2 of usual insulin dose the night before surgery and none on the morning of surgery           Tome la mitad de la dosis habitual de insulina la noche antes de la [place] y no tome nada en la  maana de la             ciruga  __X__ Stop Coumadin/Plavix/aspirin on ( STOP ASPIRIN ON AUGUST 8 )          Deje de tomar el Coumadin/Plavix/aspirina el da:  _x__ Stop Anti-inflammatories on( NO ASPIRIN PRODUCTS, OK TO TAKE TYLENOL IF NEEDED, STOP MELOXICAM ON AUGUST 8 )          Deje de tomar antiinflamatorios el da:   ____ Stop supplements until after surgery            Deje de tomar suplementos hasta despus de la [place]  ____ Bring C-Pap to the hospital          Lleve el C-Pap al hospital

## 2016-10-16 NOTE — Telephone Encounter (Signed)
Called and let patient's son know that information was faxed to Dr. Rondel Baton office just before lunch today. I explained that their office called this morning requesting the OV note, EKG, and labs for patient's clearance. I explained that this information has been fax as requested. Patient's son thanked me for doing this already.

## 2016-10-16 NOTE — Telephone Encounter (Signed)
[facility] needing notes faxed regarding moderate risk for surgical clearance on patient.   [facility] fax number: [phone]   Please Advise.  Thank you

## 2016-10-16 NOTE — Telephone Encounter (Signed)
Patient's son referring message that Dr. Hyacinth Meeker needing medical clarence for patient to have his upcoming surgery.    Contact number: [phone]  Please Advise.  Thank you

## 2016-10-17 LAB — HEMOGLOBIN A1C
Hgb A1c MFr Bld: 6.1 % — ABNORMAL HIGH (ref 4.8–5.6)
Mean Plasma Glucose: 128 mg/dL

## 2016-10-24 MED ORDER — TRANEXAMIC ACID 1000 MG/10ML IV SOLN
1000.0000 mg | INTRAVENOUS | Status: DC
  Filled 2016-10-24: qty 10

## 2016-10-24 MED ORDER — VANCOMYCIN HCL IN DEXTROSE 1-5 GM/200ML-% IV SOLN
1000.0000 mg | INTRAVENOUS | Status: AC
  Administered 2016-10-25: 1000 mg via INTRAVENOUS

## 2016-10-25 ENCOUNTER — Inpatient Hospital Stay: Payer: Medicaid Other | Admitting: Certified Registered Nurse Anesthetist

## 2016-10-25 ENCOUNTER — Encounter: Payer: Self-pay | Admitting: *Deleted

## 2016-10-25 ENCOUNTER — Inpatient Hospital Stay: Payer: Medicaid Other

## 2016-10-25 ENCOUNTER — Encounter
Admission: RE | Disposition: A | Discharge status: Home / Self Care | Payer: Self-pay | Source: Ambulatory Visit | Attending: Specialist

## 2016-10-25 ENCOUNTER — Inpatient Hospital Stay
Admission: RE | Admit: 2016-10-25 | Discharge: 2016-10-28 | DRG: 470 | Disposition: A | Discharge status: Home / Self Care | Payer: Medicaid Other | Source: Ambulatory Visit | Attending: Specialist | Admitting: Specialist

## 2016-10-25 DIAGNOSIS — I2510 Atherosclerotic heart disease of native coronary artery without angina pectoris: Secondary | ICD-10-CM | POA: Diagnosis present

## 2016-10-25 DIAGNOSIS — Z96659 Presence of unspecified artificial knee joint: Secondary | ICD-10-CM | POA: Diagnosis not present

## 2016-10-25 DIAGNOSIS — Z9842 Cataract extraction status, left eye: Secondary | ICD-10-CM

## 2016-10-25 DIAGNOSIS — M79673 Pain in unspecified foot: Secondary | ICD-10-CM

## 2016-10-25 DIAGNOSIS — E785 Hyperlipidemia, unspecified: Secondary | ICD-10-CM | POA: Diagnosis present

## 2016-10-25 DIAGNOSIS — N183 Chronic kidney disease, stage 3 (moderate): Secondary | ICD-10-CM | POA: Diagnosis present

## 2016-10-25 DIAGNOSIS — K30 Functional dyspepsia: Secondary | ICD-10-CM | POA: Diagnosis present

## 2016-10-25 DIAGNOSIS — Z8673 Personal history of transient ischemic attack (TIA), and cerebral infarction without residual deficits: Secondary | ICD-10-CM

## 2016-10-25 DIAGNOSIS — K219 Gastro-esophageal reflux disease without esophagitis: Secondary | ICD-10-CM | POA: Diagnosis present

## 2016-10-25 DIAGNOSIS — Z79899 Other long term (current) drug therapy: Secondary | ICD-10-CM | POA: Diagnosis not present

## 2016-10-25 DIAGNOSIS — M21619 Bunion of unspecified foot: Secondary | ICD-10-CM

## 2016-10-25 DIAGNOSIS — Z961 Presence of intraocular lens: Secondary | ICD-10-CM | POA: Diagnosis present

## 2016-10-25 DIAGNOSIS — Z7901 Long term (current) use of anticoagulants: Secondary | ICD-10-CM

## 2016-10-25 DIAGNOSIS — M1712 Unilateral primary osteoarthritis, left knee: Secondary | ICD-10-CM | POA: Diagnosis present

## 2016-10-25 DIAGNOSIS — I10 Essential (primary) hypertension: Secondary | ICD-10-CM | POA: Diagnosis present

## 2016-10-25 DIAGNOSIS — M25562 Pain in left knee: Secondary | ICD-10-CM | POA: Diagnosis present

## 2016-10-25 DIAGNOSIS — Z886 Allergy status to analgesic agent status: Secondary | ICD-10-CM

## 2016-10-25 DIAGNOSIS — M109 Gout, unspecified: Secondary | ICD-10-CM | POA: Diagnosis present

## 2016-10-25 DIAGNOSIS — M778 Other enthesopathies, not elsewhere classified: Secondary | ICD-10-CM

## 2016-10-25 DIAGNOSIS — M779 Enthesopathy, unspecified: Secondary | ICD-10-CM

## 2016-10-25 DIAGNOSIS — Z9841 Cataract extraction status, right eye: Secondary | ICD-10-CM

## 2016-10-25 HISTORY — DX: Presence of unspecified artificial knee joint: Z96.659

## 2016-10-25 HISTORY — PX: TOTAL KNEE ARTHROPLASTY: SHX125

## 2016-10-25 LAB — CBC
HCT: 38.4 % — ABNORMAL LOW (ref 40.0–52.0)
Hemoglobin: 13.4 g/dL (ref 13.0–18.0)
MCH: 31.5 pg (ref 26.0–34.0)
MCHC: 35 g/dL (ref 32.0–36.0)
MCV: 90 fL (ref 80.0–100.0)
Platelets: 269 10*3/uL (ref 150–440)
RBC: 4.26 MIL/uL — ABNORMAL LOW (ref 4.40–5.90)
RDW: 14.6 % — ABNORMAL HIGH (ref 11.5–14.5)
WBC: 9.3 10*3/uL (ref 3.8–10.6)

## 2016-10-25 LAB — ABO/RH: ABO/RH(D): O POS

## 2016-10-25 LAB — CREATININE, SERUM
Creatinine, Ser: 1.47 mg/dL — ABNORMAL HIGH (ref 0.61–1.24)
GFR calc Af Amer: 51 mL/min — ABNORMAL LOW (ref >60)
GFR calc non Af Amer: 44 mL/min — ABNORMAL LOW (ref >60)

## 2016-10-25 SURGERY — ARTHROPLASTY, KNEE, TOTAL
Anesthesia: Spinal | Site: Knee | Laterality: Left | Wound class: Clean

## 2016-10-25 MED ORDER — LOSARTAN POTASSIUM 50 MG PO TABS
50.0000 mg | ORAL_TABLET | Freq: Every day | ORAL | Status: DC
  Administered 2016-10-28: 50 mg via ORAL
  Filled 2016-10-25 (×3): qty 1

## 2016-10-25 MED ORDER — TRANEXAMIC ACID 1000 MG/10ML IV SOLN: 1000.0000 mg | INTRAVENOUS | Status: DC

## 2016-10-25 MED ORDER — BUPIVACAINE LIPOSOME 1.3 % IJ SUSP
INTRAMUSCULAR | Status: AC
  Filled 2016-10-25: qty 20

## 2016-10-25 MED ORDER — HYDROCODONE-ACETAMINOPHEN 7.5-325 MG PO TABS
1.0000 | ORAL_TABLET | ORAL | Status: DC | PRN
  Administered 2016-10-25 (×2): 1 via ORAL
  Administered 2016-10-25 – 2016-10-28 (×7): 2 via ORAL
  Filled 2016-10-25: qty 2
  Filled 2016-10-25 (×2): qty 1
  Filled 2016-10-25 (×6): qty 2

## 2016-10-25 MED ORDER — TRANEXAMIC ACID 1000 MG/10ML IV SOLN
1000.0000 mg | Freq: Once | INTRAVENOUS | Status: AC
  Administered 2016-10-25: 1000 mg via INTRAVENOUS
  Filled 2016-10-25: qty 10

## 2016-10-25 MED ORDER — FENTANYL CITRATE (PF) 100 MCG/2ML IJ SOLN: 25.0000 ug | INTRAMUSCULAR | Status: DC | PRN

## 2016-10-25 MED ORDER — NITROGLYCERIN 0.4 MG SL SUBL: 0.4000 mg | SUBLINGUAL_TABLET | SUBLINGUAL | Status: DC | PRN

## 2016-10-25 MED ORDER — BUPIVACAINE HCL (PF) 0.5 % IJ SOLN
INTRAMUSCULAR | Status: DC | PRN
  Administered 2016-10-25: 3 mL

## 2016-10-25 MED ORDER — METOCLOPRAMIDE HCL 10 MG PO TABS: 5.0000 mg | ORAL_TABLET | Freq: Three times a day (TID) | ORAL | Status: DC | PRN

## 2016-10-25 MED ORDER — EPHEDRINE SULFATE 50 MG/ML IJ SOLN
INTRAMUSCULAR | Status: AC
  Filled 2016-10-25: qty 1

## 2016-10-25 MED ORDER — LACTATED RINGERS IV SOLN
INTRAVENOUS | Status: DC
Start: 2016-10-25 — End: 2016-10-25
  Administered 2016-10-25 (×2): via INTRAVENOUS

## 2016-10-25 MED ORDER — MORPHINE SULFATE (PF) 4 MG/ML IV SOLN
INTRAVENOUS | Status: AC
  Filled 2016-10-25: qty 1

## 2016-10-25 MED ORDER — PHENYLEPHRINE HCL 10 MG/ML IJ SOLN
INTRAMUSCULAR | Status: AC
  Filled 2016-10-25: qty 1

## 2016-10-25 MED ORDER — BUPIVACAINE HCL (PF) 0.5 % IJ SOLN
INTRAMUSCULAR | Status: AC
  Filled 2016-10-25: qty 30

## 2016-10-25 MED ORDER — SODIUM CHLORIDE 0.9 % IJ SOLN
INTRAMUSCULAR | Status: DC | PRN
  Administered 2016-10-25: 30 mL via INTRAVENOUS

## 2016-10-25 MED ORDER — NEOMYCIN-POLYMYXIN B GU 40-200000 IR SOLN
Status: AC
  Filled 2016-10-25: qty 20

## 2016-10-25 MED ORDER — ONDANSETRON HCL 4 MG PO TABS: 4.0000 mg | ORAL_TABLET | Freq: Four times a day (QID) | ORAL | Status: DC | PRN

## 2016-10-25 MED ORDER — VANCOMYCIN HCL IN DEXTROSE 1-5 GM/200ML-% IV SOLN
1000.0000 mg | Freq: Two times a day (BID) | INTRAVENOUS | Status: AC
  Administered 2016-10-25 – 2016-10-26 (×3): 1000 mg via INTRAVENOUS
  Filled 2016-10-25 (×3): qty 200

## 2016-10-25 MED ORDER — ATORVASTATIN CALCIUM 20 MG PO TABS
40.0000 mg | ORAL_TABLET | Freq: Every day | ORAL | Status: DC
  Administered 2016-10-26 – 2016-10-27 (×2): 40 mg via ORAL
  Filled 2016-10-25 (×2): qty 2

## 2016-10-25 MED ORDER — GABAPENTIN 400 MG PO CAPS
400.0000 mg | ORAL_CAPSULE | Freq: Two times a day (BID) | ORAL | Status: DC
  Administered 2016-10-25 – 2016-10-28 (×6): 400 mg via ORAL
  Filled 2016-10-25 (×6): qty 1

## 2016-10-25 MED ORDER — FERROUS SULFATE 325 (65 FE) MG PO TABS
325.0000 mg | ORAL_TABLET | Freq: Three times a day (TID) | ORAL | Status: DC
Start: 2016-10-25 — End: 2016-10-28
  Administered 2016-10-26 – 2016-10-28 (×6): 325 mg via ORAL
  Filled 2016-10-25 (×6): qty 1

## 2016-10-25 MED ORDER — KETOROLAC TROMETHAMINE 30 MG/ML IJ SOLN
INTRAMUSCULAR | Status: AC
  Filled 2016-10-25: qty 1

## 2016-10-25 MED ORDER — FUROSEMIDE 40 MG PO TABS
40.0000 mg | ORAL_TABLET | Freq: Two times a day (BID) | ORAL | Status: DC
  Administered 2016-10-26 – 2016-10-27 (×3): 40 mg via ORAL
  Filled 2016-10-25 (×5): qty 1

## 2016-10-25 MED ORDER — GABAPENTIN 400 MG PO CAPS
400.0000 mg | ORAL_CAPSULE | ORAL | Status: AC
  Administered 2016-10-25: 400 mg via ORAL

## 2016-10-25 MED ORDER — SODIUM CHLORIDE 0.9 % IJ SOLN
INTRAMUSCULAR | Status: AC
  Filled 2016-10-25: qty 50

## 2016-10-25 MED ORDER — ALLOPURINOL 100 MG PO TABS: 100.0000 mg | ORAL_TABLET | Freq: Every day | ORAL | Status: DC

## 2016-10-25 MED ORDER — MORPHINE SULFATE (PF) 4 MG/ML IV SOLN
INTRAVENOUS | Status: DC | PRN
  Administered 2016-10-25: 4 mg via INTRAVENOUS

## 2016-10-25 MED ORDER — CELECOXIB 200 MG PO CAPS
400.0000 mg | ORAL_CAPSULE | ORAL | Status: AC
  Administered 2016-10-25: 400 mg via ORAL

## 2016-10-25 MED ORDER — ONDANSETRON HCL 4 MG/2ML IJ SOLN
INTRAMUSCULAR | Status: DC | PRN
  Administered 2016-10-25: 4 mg via INTRAVENOUS

## 2016-10-25 MED ORDER — SENNA 8.6 MG PO TABS
1.0000 | ORAL_TABLET | Freq: Two times a day (BID) | ORAL | Status: DC
  Administered 2016-10-25 – 2016-10-28 (×6): 8.6 mg via ORAL
  Filled 2016-10-25 (×6): qty 1

## 2016-10-25 MED ORDER — MAGNESIUM HYDROXIDE 400 MG/5ML PO SUSP
30.0000 mL | Freq: Every day | ORAL | Status: DC | PRN
  Administered 2016-10-26 – 2016-10-27 (×2): 30 mL via ORAL
  Filled 2016-10-25 (×2): qty 30

## 2016-10-25 MED ORDER — BUPIVACAINE HCL (PF) 0.5 % IJ SOLN
INTRAMUSCULAR | Status: AC
  Filled 2016-10-25: qty 10

## 2016-10-25 MED ORDER — CELECOXIB 200 MG PO CAPS
ORAL_CAPSULE | ORAL | Status: AC
  Administered 2016-10-25: 400 mg via ORAL
  Filled 2016-10-25: qty 2

## 2016-10-25 MED ORDER — DOCUSATE SODIUM 100 MG PO CAPS
100.0000 mg | ORAL_CAPSULE | Freq: Two times a day (BID) | ORAL | Status: DC
  Administered 2016-10-25 – 2016-10-27 (×5): 100 mg via ORAL
  Filled 2016-10-25 (×5): qty 1

## 2016-10-25 MED ORDER — COLCHICINE 0.6 MG PO TABS: 0.6000 mg | ORAL_TABLET | Freq: Every day | ORAL | Status: DC

## 2016-10-25 MED ORDER — ACETAMINOPHEN 325 MG PO TABS: 650.0000 mg | ORAL_TABLET | Freq: Four times a day (QID) | ORAL | Status: DC | PRN

## 2016-10-25 MED ORDER — PHENYLEPHRINE HCL 10 MG/ML IJ SOLN
INTRAMUSCULAR | Status: DC | PRN
  Administered 2016-10-25: 100 ug via INTRAVENOUS

## 2016-10-25 MED ORDER — KETOROLAC TROMETHAMINE 30 MG/ML IJ SOLN
INTRAMUSCULAR | Status: DC | PRN
  Administered 2016-10-25: 30 mg via INTRAVENOUS

## 2016-10-25 MED ORDER — PROPOFOL 500 MG/50ML IV EMUL
INTRAVENOUS | Status: DC | PRN
  Administered 2016-10-25: 50 ug/kg/min via INTRAVENOUS

## 2016-10-25 MED ORDER — FENTANYL CITRATE (PF) 100 MCG/2ML IJ SOLN
INTRAMUSCULAR | Status: DC | PRN
  Administered 2016-10-25: 25 ug via INTRAVENOUS

## 2016-10-25 MED ORDER — ACETAMINOPHEN 650 MG RE SUPP: 650.0000 mg | Freq: Four times a day (QID) | RECTAL | Status: DC | PRN

## 2016-10-25 MED ORDER — ONDANSETRON HCL 4 MG/2ML IJ SOLN
INTRAMUSCULAR | Status: AC
  Filled 2016-10-25: qty 2

## 2016-10-25 MED ORDER — FLEET ENEMA 7-19 GM/118ML RE ENEM: 1.0000 | ENEMA | Freq: Once | RECTAL | Status: DC | PRN

## 2016-10-25 MED ORDER — ACETAMINOPHEN-CODEINE #4 300-60 MG PO TABS: 1.0000 | ORAL_TABLET | Freq: Four times a day (QID) | ORAL | Status: DC | PRN

## 2016-10-25 MED ORDER — VANCOMYCIN HCL IN DEXTROSE 1-5 GM/200ML-% IV SOLN
INTRAVENOUS | Status: AC
  Administered 2016-10-25: 1000 mg via INTRAVENOUS
  Filled 2016-10-25: qty 200

## 2016-10-25 MED ORDER — ZOLPIDEM TARTRATE 5 MG PO TABS: 5.0000 mg | ORAL_TABLET | Freq: Every evening | ORAL | Status: DC | PRN

## 2016-10-25 MED ORDER — FLUTICASONE PROPIONATE 50 MCG/ACT NA SUSP: 2.0000 | Freq: Every day | NASAL | Status: DC

## 2016-10-25 MED ORDER — ONDANSETRON HCL 4 MG/2ML IJ SOLN: 4.0000 mg | Freq: Four times a day (QID) | INTRAMUSCULAR | Status: DC | PRN

## 2016-10-25 MED ORDER — SUCCINYLCHOLINE CHLORIDE 20 MG/ML IJ SOLN
INTRAMUSCULAR | Status: AC
  Filled 2016-10-25: qty 1

## 2016-10-25 MED ORDER — BUPIVACAINE HCL (PF) 0.25 % IJ SOLN
INTRAMUSCULAR | Status: AC
  Filled 2016-10-25: qty 30

## 2016-10-25 MED ORDER — PROMETHAZINE HCL 25 MG PO TABS
25.0000 mg | ORAL_TABLET | Freq: Three times a day (TID) | ORAL | Status: DC | PRN
  Filled 2016-10-25: qty 1

## 2016-10-25 MED ORDER — ACETAMINOPHEN 500 MG PO TABS: 1000.0000 mg | ORAL_TABLET | Freq: Four times a day (QID) | ORAL | Status: AC | PRN

## 2016-10-25 MED ORDER — CARVEDILOL 3.125 MG PO TABS
6.2500 mg | ORAL_TABLET | Freq: Two times a day (BID) | ORAL | Status: DC
  Administered 2016-10-25 – 2016-10-28 (×5): 6.25 mg via ORAL
  Filled 2016-10-25 (×6): qty 2

## 2016-10-25 MED ORDER — CHLORHEXIDINE GLUCONATE CLOTH 2 % EX PADS: 6.0000 | MEDICATED_PAD | Freq: Once | CUTANEOUS | Status: DC

## 2016-10-25 MED ORDER — FENTANYL CITRATE (PF) 100 MCG/2ML IJ SOLN
INTRAMUSCULAR | Status: AC
  Filled 2016-10-25: qty 2

## 2016-10-25 MED ORDER — ISOSORBIDE MONONITRATE ER 30 MG PO TB24
60.0000 mg | ORAL_TABLET | Freq: Every day | ORAL | Status: DC
  Administered 2016-10-27: 60 mg via ORAL
  Filled 2016-10-25 (×3): qty 2

## 2016-10-25 MED ORDER — METHOCARBAMOL 500 MG PO TABS: 500.0000 mg | ORAL_TABLET | Freq: Four times a day (QID) | ORAL | Status: DC | PRN

## 2016-10-25 MED ORDER — DEXAMETHASONE SODIUM PHOSPHATE 10 MG/ML IJ SOLN
INTRAMUSCULAR | Status: DC | PRN
  Administered 2016-10-25: 4 mg via INTRAVENOUS

## 2016-10-25 MED ORDER — ACETAMINOPHEN 500 MG PO TABS: 500.0000 mg | ORAL_TABLET | Freq: Four times a day (QID) | ORAL | Status: DC | PRN

## 2016-10-25 MED ORDER — PROPOFOL 500 MG/50ML IV EMUL
INTRAVENOUS | Status: AC
  Filled 2016-10-25: qty 50

## 2016-10-25 MED ORDER — SODIUM CHLORIDE 0.45 % IV SOLN
INTRAVENOUS | Status: DC
  Administered 2016-10-25 (×2): via INTRAVENOUS

## 2016-10-25 MED ORDER — AMLODIPINE BESYLATE 10 MG PO TABS
10.0000 mg | ORAL_TABLET | Freq: Every day | ORAL | Status: DC
  Administered 2016-10-26 – 2016-10-28 (×3): 10 mg via ORAL
  Filled 2016-10-25 (×2): qty 1

## 2016-10-25 MED ORDER — NEOMYCIN-POLYMYXIN B GU 40-200000 IR SOLN
Status: DC | PRN
  Administered 2016-10-25: 16 mL

## 2016-10-25 MED ORDER — BISACODYL 10 MG RE SUPP: 10.0000 mg | Freq: Every day | RECTAL | Status: DC | PRN

## 2016-10-25 MED ORDER — SODIUM CHLORIDE 0.9 % IV SOLN
INTRAVENOUS | Status: DC | PRN
  Administered 2016-10-25: 60 mL

## 2016-10-25 MED ORDER — GLYCOPYRROLATE 0.2 MG/ML IJ SOLN
INTRAMUSCULAR | Status: AC
  Filled 2016-10-25: qty 1

## 2016-10-25 MED ORDER — PANTOPRAZOLE SODIUM 40 MG PO TBEC
40.0000 mg | DELAYED_RELEASE_TABLET | Freq: Every day | ORAL | Status: DC
  Administered 2016-10-26 – 2016-10-28 (×3): 40 mg via ORAL
  Filled 2016-10-25 (×3): qty 1

## 2016-10-25 MED ORDER — SODIUM CHLORIDE 0.9 % IV SOLN
INTRAVENOUS | Status: DC | PRN
  Administered 2016-10-25: 1000 mg via INTRAVENOUS

## 2016-10-25 MED ORDER — ONDANSETRON HCL 4 MG/2ML IJ SOLN
4.0000 mg | Freq: Once | INTRAMUSCULAR | Status: DC | PRN
Start: 2016-10-25 — End: 2016-10-25

## 2016-10-25 MED ORDER — MORPHINE SULFATE (PF) 2 MG/ML IV SOLN: 1.0000 mg | INTRAVENOUS | Status: DC | PRN

## 2016-10-25 MED ORDER — METHOCARBAMOL 1000 MG/10ML IJ SOLN
500.0000 mg | Freq: Four times a day (QID) | INTRAVENOUS | Status: DC | PRN
  Filled 2016-10-25: qty 5

## 2016-10-25 MED ORDER — ENOXAPARIN SODIUM 30 MG/0.3ML ~~LOC~~ SOLN
30.0000 mg | SUBCUTANEOUS | Status: DC
  Administered 2016-10-26: 30 mg via SUBCUTANEOUS
  Filled 2016-10-25: qty 0.3

## 2016-10-25 MED ORDER — ALUM & MAG HYDROXIDE-SIMETH 200-200-20 MG/5ML PO SUSP
30.0000 mL | ORAL | Status: DC | PRN
  Administered 2016-10-26: 30 mL via ORAL
  Filled 2016-10-25: qty 30

## 2016-10-25 MED ORDER — METOCLOPRAMIDE HCL 5 MG/ML IJ SOLN: 5.0000 mg | Freq: Three times a day (TID) | INTRAMUSCULAR | Status: DC | PRN

## 2016-10-25 MED ORDER — GABAPENTIN 400 MG PO CAPS
ORAL_CAPSULE | ORAL | Status: AC
  Administered 2016-10-25: 400 mg via ORAL
  Filled 2016-10-25: qty 1

## 2016-10-25 MED ORDER — DIPHENHYDRAMINE HCL 12.5 MG/5ML PO ELIX: 12.5000 mg | ORAL_SOLUTION | ORAL | Status: DC | PRN

## 2016-10-25 MED ORDER — MENTHOL 3 MG MT LOZG
1.0000 | LOZENGE | OROMUCOSAL | Status: DC | PRN
  Filled 2016-10-25: qty 9

## 2016-10-25 MED ORDER — DEXAMETHASONE SODIUM PHOSPHATE 10 MG/ML IJ SOLN
INTRAMUSCULAR | Status: AC
  Filled 2016-10-25: qty 1

## 2016-10-25 MED ORDER — PHENOL 1.4 % MT LIQD
1.0000 | OROMUCOSAL | Status: DC | PRN
  Filled 2016-10-25: qty 177

## 2016-10-25 MED ORDER — SODIUM CHLORIDE 0.9 % IV SOLN
INTRAVENOUS | Status: DC | PRN
  Administered 2016-10-25: 50 ug/min via INTRAVENOUS

## 2016-10-25 SURGICAL SUPPLY — 51 items
AUTOTRANSFUS HAS 1/8 (MISCELLANEOUS) ×3
BLADE DEBAKEY 8.0 (BLADE) ×4 IMPLANT
BLADE DEBAKEY 8.0MM (BLADE) ×2
BLADE SAGITTAL WIDE XTHICK NO (BLADE) ×3 IMPLANT
CANISTER SUCT 1200ML W/VALVE (MISCELLANEOUS) ×3 IMPLANT
CANISTER SUCT 3000ML PPV (MISCELLANEOUS) ×3 IMPLANT
CAP KNEE TOTAL 3 SIGMA ×3 IMPLANT
CATH TRAY METER 16FR LF (MISCELLANEOUS) ×3 IMPLANT
CEMENT HV SMART SET (Cement) ×6 IMPLANT
CHLORAPREP W/TINT 26ML (MISCELLANEOUS) ×6 IMPLANT
COOLER POLAR GLACIER W/PUMP (MISCELLANEOUS) ×3 IMPLANT
CUFF TOURN 24 STER (MISCELLANEOUS) IMPLANT
CUFF TOURN 30 STER DUAL PORT (MISCELLANEOUS) IMPLANT
DRAPE INCISE IOBAN 66X60 STRL (DRAPES) ×3 IMPLANT
DRAPE SHEET LG 3/4 BI-LAMINATE (DRAPES) ×6 IMPLANT
DRSG AQUACEL AG ADV 3.5X10 (GAUZE/BANDAGES/DRESSINGS) ×3 IMPLANT
DRSG AQUACEL AG ADV 3.5X14 (GAUZE/BANDAGES/DRESSINGS) ×3 IMPLANT
ELECT REM PT RETURN 9FT ADLT (ELECTROSURGICAL) ×3
ELECTRODE REM PT RTRN 9FT ADLT (ELECTROSURGICAL) ×1 IMPLANT
GLOVE BIO SURGEON STRL SZ7.5 (GLOVE) ×3 IMPLANT
GLOVE BIO SURGEON STRL SZ8 (GLOVE) ×3 IMPLANT
GLOVE BIOGEL PI IND STRL 8.5 (GLOVE) ×1 IMPLANT
GLOVE BIOGEL PI INDICATOR 8.5 (GLOVE) ×2
GLOVE INDICATOR 8.0 STRL GRN (GLOVE) ×3 IMPLANT
GLOVE SURG ORTHO 8.5 STRL (GLOVE) ×3 IMPLANT
GOWN STRL REUS W/ TWL LRG LVL4 (GOWN DISPOSABLE) ×1 IMPLANT
GOWN STRL REUS W/TWL LRG LVL4 (GOWN DISPOSABLE) ×5 IMPLANT
IMMBOLIZER KNEE 19 BLUE UNIV (SOFTGOODS) ×3 IMPLANT
KIT RM TURNOVER STRD PROC AR (KITS) ×3 IMPLANT
NEEDLE SPNL 20GX3.5 QUINCKE YW (NEEDLE) ×3 IMPLANT
NS IRRIG 1000ML POUR BTL (IV SOLUTION) ×3 IMPLANT
PACK TOTAL KNEE (MISCELLANEOUS) ×3 IMPLANT
PAD WRAPON POLAR KNEE (MISCELLANEOUS) ×1 IMPLANT
PULSAVAC PLUS IRRIG FAN TIP (DISPOSABLE) ×3
SOL .9 NS 3000ML IRR  AL (IV SOLUTION) ×2
SOL .9 NS 3000ML IRR UROMATIC (IV SOLUTION) ×1 IMPLANT
SPONGE LAP 18X18 5 PK (GAUZE/BANDAGES/DRESSINGS) IMPLANT
STAPLER SKIN PROX 35W (STAPLE) ×3 IMPLANT
SUCTION FRAZIER HANDLE 10FR (MISCELLANEOUS) ×2
SUCTION TUBE FRAZIER 10FR DISP (MISCELLANEOUS) ×1 IMPLANT
SUT BONE WAX W31G (SUTURE) ×3 IMPLANT
SUT DVC 2 QUILL PDO  T11 36X36 (SUTURE) ×2
SUT DVC 2 QUILL PDO T11 36X36 (SUTURE) ×1 IMPLANT
SUT QUILL PDO 0 36 36 VIOLET (SUTURE) ×6 IMPLANT
SYR 20CC LL (SYRINGE) ×9 IMPLANT
SYSTEM AUTOTRANSFUS DUAL TROCR (MISCELLANEOUS) ×1 IMPLANT
TAPE MICROFOAM 4IN (TAPE) ×3 IMPLANT
TIP FAN IRRIG PULSAVAC PLUS (DISPOSABLE) ×1 IMPLANT
TOWER CARTRIDGE SMART MIX (DISPOSABLE) ×3 IMPLANT
TUBE SUCT KAM VAC (TUBING) ×3 IMPLANT
WRAPON POLAR PAD KNEE (MISCELLANEOUS) ×3

## 2016-10-25 NOTE — Anesthesia Post-op Follow-up Note (Signed)
Anesthesia QCDR form completed.        

## 2016-10-25 NOTE — Care Management Note (Signed)
Case Management Note  Patient Details  Name: Gerald Hodges MRN: [id number] Date of Birth: [birth year]  Subjective/Objective:  RNCM consult for dsicharge planning. Patient has Medicaid which will not pay but for 8 home health/OP Visits total.   Will order any needed DME.                   Action/Plan: Await PT evaluation  Expected Discharge Date:                  Expected Discharge Plan:  OP Rehab  In-House Referral:     Discharge planning Services  CM Consult  Post Acute Care Choice:    Choice offered to:     DME Arranged:    DME Agency:     HH Arranged:    HH Agency:     Status of Service:  In process, will continue to follow  If discussed at Long Length of Stay Meetings, dates discussed:    Additional Comments:  Marily Memos, RN 10/25/2016, 3:59 PM

## 2016-10-25 NOTE — Progress Notes (Signed)
Patient admitted to unit alert and oriented. Verbalizes numbness on lower extremities. Polar care and knee immobilizer in place. O2 levels 89% on room air. Patient was put on oxygen 2 L and O2 Sat 94%.

## 2016-10-25 NOTE — Op Note (Signed)
DATE OF SURGERY:  10/25/2016 TIME: 10:28 AM  PATIENT NAME:  Gerald Hodges   AGE: 81 y.o.    PRE-OPERATIVE DIAGNOSIS:  M17.12 Unilateral primary osteoarthritis, left knee  POST-OPERATIVE DIAGNOSIS:  Same  PROCEDURE:  Procedure(s): TOTAL KNEE ARTHROPLASTY LEFT   SURGEON:  Megin Consalvo E, MD   ASSISTANT: Altamese Cabal, PAC  OPERATIVE IMPLANTS: Depuy LCS Femur/Patella size LARGE, Tibia size # 5,  Rotating platform polyethylene size 15  mm  Total tourniquet time was 113 minutes.  PREOPERATIVE INDICATIONS:  Gerald Hodges is a 81 y.o. year old male with end stage bone on bone degenerative arthritis of the knee who failed conservative treatment, including injections, antiinflammatories, activity modification, and assistive devices, and had significant impairment of their activities of daily living, and elected for Total Knee Arthroplasty.   The risks, benefits, and alternatives were discussed at length including but not limited to the risks of infection, bleeding, nerve injury, stiffness, blood clots, the need for revision surgery, cardiopulmonary complications, among others, and they were willing to proceed.  OPERATIVE FINDINGS AND UNIQUE ASPECTS OF THE CASE:  SEVERE ARTHRITIS WITH SUBLUXATION  OPERATIVE DESCRIPTION:   The patient was brought to the operative room and placed in a supine position. Spinal anesthesia was administered. IV VANCOMYCIN antibiotics were given. The lower extremity was prepped and draped in the usual sterile fashion. Time out was performed. The leg was elevated and exsanguinated and the tourniquet was inflated to 350 mmHg  An anterior midline incision was made.  Anterior quadriceps tendon splitting approach was performed. The patella was everted and osteophytes were removed. The anterior horn of the medial and lateral meniscus was removed.  Then the extramedullary tibial cutting jig was utilized making the appropriate cut using the anterior  tibial crest as a reference building in appropriate posterior slope. Care was taken during the cut to protect the medial and collateral ligaments. The proximal tibia was removed along with the posterior horns of the menisci. The PCL was sacrificed.  The distal femur was sized as above . Medial release was carried out. The anterior femoral cutting guide was aligned and centering hole made. The rotation guide was inserted and the anterior cutting guide pinned in place, and was in excellent alignment. The posterior femoral cuts were made. The flexion gap was established. The distal femoral cutting guide was introduced at 4 of valgus. This was pinned and the distal femoral cut made. The extension gap was established and was stable. The finishing guide was applied and finishing cuts made. The Mchale retractor was inserted and the keeled tibial trial was pinned in place. Centering hole was made and the keel inserted. The femoral component was inserted along with a polyethylene  insert and the knee articulated.  Extension and flexion showed good stability throughout. The patella was then sized and cut made for the patellar component. Centering holes were made. The trial was inserted and the knee articulated nicely with no need for lateral release. The trials were all removed and the knee thoroughly irrigated with pulsed lavage. Exparil was injected. The knee was dried and the cement mixed. The  keeled tibial component,  femoral component and patellar components were all cemented in place and excess cement was removed. The cement was allowed to harden for 10 minutes. Further irrigation was carried out. Bone wax was applied to all raw bony surfaces. Autovac drains were inserted. Quarter percent plain Marcaine, Toradol and morphine were injected. The capsule was closed with #2 Quill suture, and the  subcutaneous tissues were closed with 0 Quill suture. The skin was closed with staples.Sponge and needle counts were correct.   Aquacel dressing with TENS pads and a dry sterile dressing were applied. Polar Care and knee immobilizer were applied. Tourniquet was deflated with excellent return of blood flow to foot. Patient was transferred to a hospital bed and taken to the recovery room in good condition.  Valinda Hoar, MD

## 2016-10-25 NOTE — NC FL2 (Signed)
[place] MEDICAID FL2 LEVEL OF CARE SCREENING TOOL     IDENTIFICATION  Patient Name: Gerald Hodges Birthdate: [birth year] Sex: male Admission Date (Current Location): 10/25/2016  [facility] and [place] Number:  [address]  ([id number] [facility]) Facility and Address:  [facility], [address], [place], [place] [address]      Provider Number: [id number]  Attending Physician Name and Address:  Deeann Saint, MD  Relative Name and Phone Number:       Current Level of Care: Hospital Recommended Level of Care: Skilled Nursing Facility Prior Approval Number:    Date Approved/Denied:   PASRR Number:    Discharge Plan: SNF    Current Diagnoses: Patient Active Problem List   Diagnosis Date Noted  . Total knee replacement status 10/25/2016  . Knee osteoarthritis 07/21/2016  . Low back pain 09/21/2015  . Allergic rhinitis 08/04/2015  . Stable angina (HCC) 06/28/2015  . Bunion of unspecified foot 06/28/2015  . Gout 10/09/2014  . Essential hypertension, benign 08/18/2014  . Psoriasis 08/18/2014  . Chronic kidney disease, stage III (moderate) 08/18/2014  . Chronic fatigue 02/17/2014  . PAC (premature atrial contraction) 02/17/2014  . Chronic kidney disease 11/18/2013  . GERD (gastroesophageal reflux disease) 03/19/2013  . CAD (coronary artery disease) 03/18/2013  . Angina pectoris (HCC) 10/09/2012  . Hyperlipidemia 10/06/2012    Orientation RESPIRATION BLADDER Height & Weight     Self, Time, Situation, Place  O2 (2 Liters Oxygen ) Continent Weight:   Height:     BEHAVIORAL SYMPTOMS/MOOD NEUROLOGICAL BOWEL NUTRITION STATUS   (none)  (none) Continent Diet (Diet: Clear Liquid to be Advanced. )  AMBULATORY STATUS COMMUNICATION OF NEEDS Skin   Extensive Assist Verbally Surgical wounds (Incision: Left Knee. )                       Personal Care Assistance Level of Assistance  Bathing, Feeding, Dressing Bathing Assistance: Limited  assistance Feeding assistance: Independent Dressing Assistance: Limited assistance     Functional Limitations Info  Sight, Hearing, Speech Sight Info: Adequate Hearing Info: Adequate Speech Info: Adequate    SPECIAL CARE FACTORS FREQUENCY  PT (By licensed PT), OT (By licensed OT)     PT Frequency:  (5) OT Frequency:  (5)            Contractures      Additional Factors Info  Code Status, Allergies Code Status Info:  (Full Code. ) Allergies Info:  (Aspirin, Lisinopril)           Current Medications (10/25/2016):  This is the current hospital active medication list Current Facility-Administered Medications  Medication Dose Route Frequency Provider Last Rate Last Dose  . 0.45 % sodium chloride infusion   Intravenous Continuous Deeann Saint, MD 75 mL/hr at 10/25/16 1203    . acetaminophen (TYLENOL) tablet 650 mg  650 mg Oral Q6H PRN Deeann Saint, MD       Or  . acetaminophen (TYLENOL) suppository 650 mg  650 mg Rectal Q6H PRN Deeann Saint, MD      . acetaminophen (TYLENOL) tablet 1,000 mg  1,000 mg Oral Q6H PRN Deeann Saint, MD      . alum & mag hydroxide-simeth (MAALOX/MYLANTA) 200-200-20 MG/5ML suspension 30 mL  30 mL Oral Q4H PRN Deeann Saint, MD      . Melene Muller ON 10/26/2016] amLODipine (NORVASC) tablet 10 mg  10 mg Oral Daily Deeann Saint, MD      . Melene Muller ON 10/26/2016] atorvastatin (LIPITOR) tablet  40 mg  40 mg Oral Daily Deeann Saint, MD      . bisacodyl (DULCOLAX) suppository 10 mg  10 mg Rectal Daily PRN Deeann Saint, MD      . carvedilol (COREG) tablet 6.25 mg  6.25 mg Oral BID WC Deeann Saint, MD      . diphenhydrAMINE (BENADRYL) 12.5 MG/5ML elixir 12.5-25 mg  12.5-25 mg Oral Q4H PRN Deeann Saint, MD      . docusate sodium (COLACE) capsule 100 mg  100 mg Oral BID Deeann Saint, MD      . Melene Muller ON 10/26/2016] enoxaparin (LOVENOX) injection 30 mg  30 mg Subcutaneous Q24H Deeann Saint, MD      . ferrous sulfate tablet 325 mg  325 mg Oral TID Sherilyn Cooter, MD      . furosemide (LASIX) tablet 40 mg  40 mg Oral BID Deeann Saint, MD      . gabapentin (NEURONTIN) capsule 400 mg  400 mg Oral BID Deeann Saint, MD      . HYDROcodone-acetaminophen [facility]) 7.5-325 MG per tablet 1-2 tablet  1-2 tablet Oral Q4H PRN Deeann Saint, MD      . isosorbide mononitrate (IMDUR) 24 hr tablet 60 mg  60 mg Oral Daily Deeann Saint, MD      . losartan (COZAAR) tablet 50 mg  50 mg Oral Daily Deeann Saint, MD      . magnesium hydroxide (MILK OF MAGNESIA) suspension 30 mL  30 mL Oral Daily PRN Deeann Saint, MD      . menthol-cetylpyridinium (CEPACOL) lozenge 3 mg  1 lozenge Oral PRN Deeann Saint, MD       Or  . phenol (CHLORASEPTIC) mouth spray 1 spray  1 spray Mouth/Throat PRN Deeann Saint, MD      . methocarbamol (ROBAXIN) tablet 500 mg  500 mg Oral Q6H PRN Deeann Saint, MD       Or  . methocarbamol (ROBAXIN) 500 mg in dextrose 5 % 50 mL IVPB  500 mg Intravenous Q6H PRN Deeann Saint, MD      . metoCLOPramide (REGLAN) tablet 5-10 mg  5-10 mg Oral Q8H PRN Deeann Saint, MD       Or  . metoCLOPramide (REGLAN) injection 5-10 mg  5-10 mg Intravenous Q8H PRN Deeann Saint, MD      . morphine 2 MG/ML injection 1 mg  1 mg Intravenous Q2H PRN Deeann Saint, MD      . nitroGLYCERIN (NITROSTAT) SL tablet 0.4 mg  0.4 mg Sublingual Q5 min PRN Deeann Saint, MD      . ondansetron [facility]) tablet 4 mg  4 mg Oral Q6H PRN Deeann Saint, MD       Or  . ondansetron [facility]) injection 4 mg  4 mg Intravenous Q6H PRN Deeann Saint, MD      . Melene Muller ON 10/26/2016] pantoprazole (PROTONIX) EC tablet 40 mg  40 mg Oral Daily Deeann Saint, MD      . promethazine (PHENERGAN) tablet 25 mg  25 mg Oral Q8H PRN Deeann Saint, MD      . senna [facility]) tablet 8.6 mg  1 tablet Oral BID Deeann Saint, MD      . sodium phosphate (FLEET) 7-19 GM/118ML enema 1 enema  1 enema Rectal Once PRN Deeann Saint, MD      . vancomycin (VANCOCIN) IVPB 1000 mg/200 mL  premix  1,000 mg Intravenous Q12H Deeann Saint, MD      . zolpidem [facility]) tablet 5 mg  5  mg Oral QHS PRN Deeann Saint, MD         Discharge Medications: Please see discharge summary for a list of discharge medications.  Relevant Imaging Results:  Relevant Lab Results:   Additional Information    Ilham Roughton, Darleen Crocker, LCSW

## 2016-10-25 NOTE — H&P (Signed)
THE PATIENT WAS SEEN PRIOR TO SURGERY TODAY.  HISTORY, ALLERGIES, HOME MEDICATIONS AND OPERATIVE PROCEDURE WERE REVIEWED. RISKS AND BENEFITS OF SURGERY DISCUSSED WITH PATIENT AGAIN.  NO CHANGES FROM INITIAL HISTORY AND PHYSICAL NOTED.   LOYDA, OUR SPANISH INTERPRETER WAS USED TO TALK TO THE PATIENT AND FAMILY.

## 2016-10-25 NOTE — Anesthesia Preprocedure Evaluation (Signed)
Anesthesia Evaluation  Patient identified by MRN, date of birth, ID band Patient awake    Reviewed: Allergy & Precautions, H&P , NPO status , Patient's Chart, lab work & pertinent test results, reviewed documented beta blocker date and time   Airway Mallampati: II   Neck ROM: full    Dental  (+) Teeth Intact   Pulmonary neg pulmonary ROS,    Pulmonary exam normal        Cardiovascular hypertension, + angina with exertion + CAD  negative cardio ROS Normal cardiovascular exam Rhythm:regular Rate:Normal     Neuro/Psych TIAnegative neurological ROS  negative psych ROS   GI/Hepatic negative GI ROS, Neg liver ROS, GERD  Medicated,  Endo/Other  negative endocrine ROS  Renal/GU Renal diseasenegative Renal ROS  negative genitourinary   Musculoskeletal   Abdominal   Peds  Hematology negative hematology ROS (+)   Anesthesia Other Findings Past Medical History: Left knee: Arthritis No date: Chronic kidney disease     Comment:  STAGE 3 No date: Coronary artery disease No date: GERD (gastroesophageal reflux disease) No date: Gout No date: History of kidney problems 08/18/2014: Hyperlipemia No date: Hyperlipidemia No date: Hypertension No date: TIA (transient ischemic attack) Past Surgical History: No date: BUNIONECTOMY; Bilateral No date: EYE SURGERY; Bilateral     Comment:  Cataractt Extraction with IOL   Reproductive/Obstetrics negative OB ROS                             Anesthesia Physical Anesthesia Plan  ASA: III  Anesthesia Plan: Spinal   Post-op Pain Management:    Induction:   PONV Risk Score and Plan: 3 and Ondansetron, Dexamethasone, Midazolam and Propofol infusion  Airway Management Planned:   Additional Equipment:   Intra-op Plan:   Post-operative Plan:   Informed Consent: I have reviewed the patients History and Physical, chart, labs and discussed the procedure  including the risks, benefits and alternatives for the proposed anesthesia with the patient or authorized representative who has indicated his/her understanding and acceptance.   Dental Advisory Given  Plan Discussed with: CRNA  Anesthesia Plan Comments:         Anesthesia Quick Evaluation

## 2016-10-25 NOTE — Anesthesia Procedure Notes (Signed)
Date/Time: 10/25/2016 7:41 AM Performed by: Marlana Salvage Pre-anesthesia Checklist: Patient identified, Emergency Drugs available, Suction available, Patient being monitored and Timeout performed Patient Re-evaluated:Patient Re-evaluated prior to induction Oxygen Delivery Method: Nasal cannula Placement Confirmation: positive ETCO2

## 2016-10-25 NOTE — Transfer of Care (Signed)
Immediate Anesthesia Transfer of Care Note  Patient: Gerald Hodges  Procedure(s) Performed: Procedure(s): TOTAL KNEE ARTHROPLASTY (Left)  Patient Location: PACU  Anesthesia Type:Spinal  Level of Consciousness: awake, alert  and oriented  Airway & Oxygen Therapy: Patient Spontanous Breathing and Patient connected to nasal cannula oxygen  Post-op Assessment: Report given to RN and Post -op Vital signs reviewed and stable  Post vital signs: Reviewed and stable  Last Vitals:  Vitals:   10/25/16 0612 10/25/16 1035  BP: 135/77 111/69  Pulse: 68 66  Resp: 18 12  Temp: 36.6 C 36.8 C  SpO2: 100% 100%    Last Pain:  Vitals:   10/25/16 1035  TempSrc: Temporal  PainSc: 0-No pain         Complications: No apparent anesthesia complications

## 2016-10-25 NOTE — Anesthesia Procedure Notes (Signed)
Spinal  Patient location during procedure: OR Staffing Performed: anesthesiologist  Preanesthetic Checklist Completed: patient identified, site marked, surgical consent, pre-op evaluation, timeout performed, IV checked, risks and benefits discussed and monitors and equipment checked Spinal Block Patient position: sitting Prep: Betadine Patient monitoring: heart rate, continuous pulse ox, blood pressure and cardiac monitor Approach: midline Location: L4-5 Injection technique: single-shot Needle Needle type: Quincke  Needle gauge: 22 G Needle length: 9 cm Assessment Sensory level: T4 Additional Notes Negative paresthesia. Negative blood return. Positive free-flowing CSF. Expiration date of kit checked and confirmed. Patient tolerated procedure well, without complications.

## 2016-10-25 NOTE — Evaluation (Signed)
Physical Therapy Evaluation Patient Details Name: Gerald Hodges MRN: [id number] DOB: December 06, 1935 Today's Date: 10/25/2016   History of Present Illness  Pt is a pleasant 81 yo M who underwent L TKR without reported post-op complications. PT evaluation performed on POD#0.  Clinical Impression  Pt admitted with above diagnosis. Pt currently with functional limitations due to the deficits listed below (see PT Problem List).  Pt demonstrates excellent strength, ROM, and mobility during initial evaluation. He is modified independent with bed mobility and requires CGA only for transfers and ambulation from bed to recliner. He demonstrates good RLE strength and ability to maintain PWB on LLE. Decreased step length but good stability. He is able to complete all supine exercises as instructed. AAROM is 0-105 degrees and appears to be primarily limited by his dressings. Pt will benefit from PT services to address deficits in strength, balance, and mobility in order to return to full function at home.      Follow Up Recommendations Home health PT;Other (comment) (HH PT vs OP PT pending transportation and insurance)    Equipment Recommendations  Rolling walker with 5" wheels    Recommendations for Other Services OT consult     Precautions / Restrictions Precautions Precautions: Knee Precaution Booklet Issued: Yes (comment) Required Braces or Orthoses: Knee Immobilizer - Left Knee Immobilizer - Left: On at all times Restrictions Weight Bearing Restrictions: Yes LLE Weight Bearing: Partial weight bearing LLE Partial Weight Bearing Percentage or Pounds: 25-50%      Mobility  Bed Mobility Overal bed mobility: Modified Independent             General bed mobility comments: Increased time, HOB elevated, use of bed rails  Transfers Overall transfer level: Needs assistance Equipment used: None Transfers: Sit to/from Stand Sit to Stand: Min guard         General transfer  comment: Pt provided cues for hand placement but continues to attempt to pull up on walker. Good stability once upright in standing with bilateral UE support on rolling walker  Ambulation/Gait Ambulation/Gait assistance: Min guard Ambulation Distance (Feet): 5 Feet Assistive device: Rolling walker (2 wheeled) Gait Pattern/deviations: Step-to pattern;Decreased weight shift to left;Antalgic Gait velocity: Decreased Gait velocity interpretation: <1.8 ft/sec, indicative of risk for recurrent falls General Gait Details: Pt educated about proper sequencing with walker to ambulate from bed to recliner. He demonstrates good RLE strength and ability to maintain PWB on LLE. Decreased step length but good stability  Stairs            Wheelchair Mobility    Modified Rankin (Stroke Patients Only)       Balance Overall balance assessment: Needs assistance Sitting-balance support: No upper extremity supported Sitting balance-Leahy Scale: Good     Standing balance support: No upper extremity supported Standing balance-Leahy Scale: Fair Standing balance comment: Able to maintain balance with decreased weight shift to LLE                             Pertinent Vitals/Pain Pain Assessment: 0-10 Pain Score: 4  Pain Location: L knee Pain Descriptors / Indicators: Sore Pain Intervention(s): Monitored during session    Home Living Family/patient expects to be discharged to:: Private residence Living Arrangements: Spouse/significant other;Children;Other (Comment) (Son) Available Help at Discharge: Family Type of Home: House Home Access: Stairs to enter Entrance Stairs-Rails: None Entrance Stairs-Number of Steps: 2 Home Layout: One level Home Equipment: Cane - single point (no walker, no  grab bars, no shower seat, no BSC)      Prior Function Level of Independence: Independent         Comments: Independent with ADLs/IADLs prior to surgery. No falls     Hand Dominance    Dominant Hand: Right    Extremity/Trunk Assessment   Upper Extremity Assessment Upper Extremity Assessment: Overall WFL for tasks assessed    Lower Extremity Assessment Lower Extremity Assessment: LLE deficits/detail LLE Deficits / Details: Reports intact sensation LLE to light touch. Able to perform LAQ and SLR without assist. Full DF/PF strength. RLE strength grossly WFL       Communication   Communication: Prefers language other than [place];Interpreter utilized  Cognition Arousal/Alertness: Awake/alert Behavior During Therapy: WFL for tasks assessed/performed Overall Cognitive Status: Within Functional Limits for tasks assessed                                        General Comments      Exercises Total Joint Exercises Ankle Circles/Pumps: AROM;Both;10 reps;Supine Quad Sets: Strengthening;Both;10 reps;Supine Towel Squeeze: Strengthening;Both;10 reps;Supine Hip ABduction/ADduction: Strengthening;Left;10 reps;Supine Straight Leg Raises: Strengthening;Left;10 reps;Supine Long Arc Quad: Strengthening;Left;10 reps;Seated Goniometric ROM: 0-105 AAROM, limited by dressings   Assessment/Plan    PT Assessment Patient needs continued PT services  PT Problem List Decreased strength;Decreased range of motion;Decreased activity tolerance;Decreased balance;Decreased mobility;Decreased knowledge of use of DME;Pain       PT Treatment Interventions DME instruction;Gait training;Stair training;Therapeutic activities;Therapeutic exercise;Balance training;Neuromuscular re-education;Patient/family education;Manual techniques    PT Goals (Current goals can be found in the Care Plan section)  Acute Rehab PT Goals Patient Stated Goal: Return to prior function at home PT Goal Formulation: With patient/family Time For Goal Achievement: 11/08/16 Potential to Achieve Goals: Good    Frequency BID   Barriers to discharge        Co-evaluation                AM-PAC PT "6 Clicks" Daily Activity  Outcome Measure Difficulty turning over in bed (including adjusting bedclothes, sheets and blankets)?: A Little Difficulty moving from lying on back to sitting on the side of the bed? : A Little Difficulty sitting down on and standing up from a chair with arms (e.g., wheelchair, bedside commode, etc,.)?: A Little Help needed moving to and from a bed to chair (including a wheelchair)?: A Little Help needed walking in hospital room?: A Little Help needed climbing 3-5 steps with a railing? : A Lot 6 Click Score: 17    End of Session Equipment Utilized During Treatment: Gait belt Activity Tolerance: Patient tolerated treatment well Patient left: in chair;with call bell/phone within reach;with chair alarm set;with SCD's reapplied;Other (comment) (towel roll under heel, polar care in place) Nurse Communication: Mobility status;Patient requests pain meds PT Visit Diagnosis: Muscle weakness (generalized) (M62.81);Pain;Difficulty in walking, not elsewhere classified (R26.2) Pain - Right/Left: Left Pain - part of body: Knee    Time: 1610-9604 PT Time Calculation (min) (ACUTE ONLY): 49 min   Charges:   PT Evaluation $PT Eval Low Complexity: 1 Low PT Treatments $Therapeutic Exercise: 8-22 mins   PT G Codes:   PT G-Codes **NOT FOR INPATIENT CLASS** Functional Assessment Tool Used: AM-PAC 6 Clicks Basic Mobility Functional Limitation: Mobility: Walking and moving around Mobility: Walking and Moving Around Current Status (V4098): At least 40 percent but less than 60 percent impaired, limited or restricted Mobility: Walking and Moving  Around Goal Status [phone]): At least 1 percent but less than 20 percent impaired, limited or restricted    Lynnea Maizes PT, DPT    Lupe Handley 10/25/2016, 4:42 PM

## 2016-10-26 LAB — CBC
HCT: 34.1 % — ABNORMAL LOW (ref 40.0–52.0)
Hemoglobin: 11.7 g/dL — ABNORMAL LOW (ref 13.0–18.0)
MCH: 30.9 pg (ref 26.0–34.0)
MCHC: 34.4 g/dL (ref 32.0–36.0)
MCV: 89.8 fL (ref 80.0–100.0)
Platelets: 256 10*3/uL (ref 150–440)
RBC: 3.79 MIL/uL — ABNORMAL LOW (ref 4.40–5.90)
RDW: 14.5 % (ref 11.5–14.5)
WBC: 13.4 10*3/uL — ABNORMAL HIGH (ref 3.8–10.6)

## 2016-10-26 LAB — BASIC METABOLIC PANEL
Anion gap: 5 (ref 5–15)
BUN: 27 mg/dL — ABNORMAL HIGH (ref 6–20)
CO2: 27 mmol/L (ref 22–32)
Calcium: 8.3 mg/dL — ABNORMAL LOW (ref 8.9–10.3)
Chloride: 100 mmol/L — ABNORMAL LOW (ref 101–111)
Creatinine, Ser: 1.46 mg/dL — ABNORMAL HIGH (ref 0.61–1.24)
GFR calc Af Amer: 51 mL/min — ABNORMAL LOW (ref >60)
GFR calc non Af Amer: 44 mL/min — ABNORMAL LOW (ref >60)
Glucose, Bld: 126 mg/dL — ABNORMAL HIGH (ref 65–99)
Potassium: 4.7 mmol/L (ref 3.5–5.1)
Sodium: 132 mmol/L — ABNORMAL LOW (ref 135–145)

## 2016-10-26 NOTE — Progress Notes (Signed)
Subjective: Little pain,  Eating well.  Walked with PT   1 Day Post-Op Procedure(s) (LRB): TOTAL KNEE ARTHROPLASTY (Left)    Patient reports pain as mild.  Drain removed.  hgb stable   Objective:   VITALS:   Vitals:   10/26/16 0945 10/26/16 1141  BP: 120/66 (!) 119/49  Pulse: 73 73  Resp: 16   Temp: 98.7 F (37.1 C)   SpO2: 99%     Neurologically intact ABD soft Neurovascular intact Sensation intact distally Intact pulses distally Dorsiflexion/Plantar flexion intact Incision: dressing C/D/I  LABS  Recent Labs  10/25/16 1137 10/26/16 0350  HGB 13.4 11.7*  HCT 38.4* 34.1*  WBC 9.3 13.4*  PLT 269 256     Recent Labs  10/25/16 1137 10/26/16 0350  NA  --  132*  K  --  4.7  BUN  --  27*  CREATININE 1.47* 1.46*  GLUCOSE  --  126*    No results for input(s): LABPT, INR in the last 72 hours.   Assessment/Plan: 1 Day Post-Op Procedure(s) (LRB): TOTAL KNEE ARTHROPLASTY (Left)   Advance diet Up with therapy D/C IV fluids

## 2016-10-26 NOTE — Clinical Social Work Note (Signed)
CSW received referral for SNF.  Case discussed with case manager and plan is to discharge home with home health.  CSW to sign off please re-consult if social work needs arise.  Adriana Quinby R. Indianna Boran, MSW, LCSWA [phone]  

## 2016-10-26 NOTE — Progress Notes (Signed)
PT Cancellation Note  Patient Details Name: Gerald Hodges MRN: [id number] DOB: 1935/03/31   Cancelled Treatment:    Reason Eval/Treat Not Completed: Medical issues which prohibited therapy. Treatment attempted; interpreter at attempted visit. Pt remains in chair resting with a towel over his head. Per conversation, pt is ill feeling very nauseated and weak. Pt notes he was given medication, but continues to feel very bad. Pt states he feels as if he would pass out if he attempted to get up/move. Pt answers that his blood pressure was checked and it is running low. Re attempt treatment tomorrow.    Scot Dock, PTA 10/26/2016, 2:25 PM

## 2016-10-26 NOTE — Anesthesia Postprocedure Evaluation (Signed)
Anesthesia Post Note  Patient: Gerald Hodges  Procedure(s) Performed: Procedure(s) (LRB): TOTAL KNEE ARTHROPLASTY (Left)  Patient location during evaluation: Nursing Unit Anesthesia Type: Spinal Level of consciousness: oriented and awake and alert Pain management: pain level controlled Vital Signs Assessment: post-procedure vital signs reviewed and stable Respiratory status: respiratory function stable Cardiovascular status: stable Postop Assessment: no headache, no backache, spinal receding, patient able to bend at knees, no signs of nausea or vomiting and adequate PO intake Anesthetic complications: no     Last Vitals:  Vitals:   10/25/16 2321 10/26/16 0427  BP: 103/65 126/71  Pulse: 60 64  Resp: (!) 6 16  Temp: 36.7 C 36.6 C  SpO2: 94% 95%    Last Pain:  Vitals:   10/26/16 0511  TempSrc:   PainSc: 7                  Clydene Pugh

## 2016-10-26 NOTE — Care Management Note (Signed)
Case Management Note  Patient Details  Name: Gerald Hodges MRN: [id number] Date of Birth: Aug 05, 1935  Subjective/Objective:  Met with patient, family and interpreter at bedside. PT recommending  OP PT. Patient and his family agree that they can get to OP PT if it is scheduled for the morning hours.TC to [facility]. First OP PT appt. August 23, 8:45 am. Updated son and he is agreeable to date and time. Walker ordered from [facility] and delivered. It is anticipated patient will be discharged tomorrow.                      Action/Plan: Following  Expected Discharge Date:                  Expected Discharge Plan:  OP Rehab  In-House Referral:     Discharge planning Services  CM Consult  Post Acute Care Choice:    Choice offered to:  Patient, Adult Children  DME Arranged:    DME Agency:     HH Arranged:    [place] Agency:     Status of Service:  In process, will continue to follow  If discussed at Long Length of Stay Meetings, dates discussed:    Additional Comments:  Jolly Mango, RN 10/26/2016, 10:00 AM

## 2016-10-26 NOTE — Evaluation (Signed)
Occupational Therapy Evaluation Patient Details Name: Gerald Hodges MRN: [id number] DOB: 05-20-1935 Today's Date: 10/26/2016    History of Present Illness Pt is a pleasant 81 yo M who underwent L TKR without reported post-op complications. OT evaluation performed on POD#1.   Clinical Impression   Pt seen for OT evaluation this date. Pt is Spanish speaking, interpreter utilized for entire session. Pt was independent prior to surgery with increasing difficulty with mobility leading up to surgery due to pain, no falls, living in 1 story home with several steps to enter. Pt enjoys gardening/yardwork and is looking forward to being able to participate in this meaningful activity again. Pt presents with 5/10 pain in L knee, mod I for bed mobility, min guard for transfers and ambulation with RW with initial verbal cues for hand placement to maximize safety, and pt able to maintain partial weight bearing precautions throughout session. Pt/spouse educated in compression stocking mgt, BSC for toilet/showering, and polar care mgt. Spouse able to provide min assist for LB ADL tasks including dressing. Pt/spouse verbalized understanding of all information provided via verbal instruction and visual demonstration. Recommend BSC for overnight toileting beside bed, placed over low toilet at home during the day, and for seated shower to maximize safety and functional independence with toileting and bathing tasks. Pt will benefit from skilled OT services while in the hospital to address noted impairments and functional deficits in order to maximize return to PLOF. Follow up therapy recommended as arranged by surgeon.    Follow Up Recommendations  DC plan and follow up therapy as arranged by surgeon    Equipment Recommendations  3 in 1 bedside commode    Recommendations for Other Services       Precautions / Restrictions Precautions Precautions: Knee Required Braces or Orthoses: Knee Immobilizer -  Left Knee Immobilizer - Left: On at all times Restrictions Weight Bearing Restrictions: Yes LLE Weight Bearing: Partial weight bearing LLE Partial Weight Bearing Percentage or Pounds: 25-50      Mobility Bed Mobility Overal bed mobility: Modified Independent             General bed mobility comments: Increased time, HOB elevated, use of bed rails  Transfers Overall transfer level: Needs assistance Equipment used: Rolling walker (2 wheeled) Transfers: Sit to/from Stand Sit to Stand: Min guard         General transfer comment: VC for hand placement on bed to push up    Balance Overall balance assessment: Needs assistance Sitting-balance support: No upper extremity supported Sitting balance-Leahy Scale: Good     Standing balance support: No upper extremity supported;During functional activity Standing balance-Leahy Scale: Fair Standing balance comment: no LOB while completing donning of shorts over hips with BUE and while maintaining PWBing precautions                           ADL either performed or assessed with clinical judgement   ADL Overall ADL's : Needs assistance/impaired                     Lower Body Dressing: Set up;Minimal assistance;Sitting/lateral leans;Sit to/from stand;Min guard Lower Body Dressing Details (indicate cue type and reason): initial min assist to thread shorts over L foot and manage hemovac, with spouse otherwise able to assist independently, pt able to pull up with min guard in standing               General ADL Comments:  pt generally requires min assist for LB ADL. Spouse able to assist. Pt/spouse educated in compression stocking mgt and polar care mgt, both verbalized understanding.     Vision Baseline Vision/History: No visual deficits Patient Visual Report: No change from baseline Vision Assessment?: No apparent visual deficits     Perception     Praxis      Pertinent Vitals/Pain Pain Assessment:  0-10 Pain Score: 5  Pain Location: L knee Pain Descriptors / Indicators: Sore Pain Intervention(s): Limited activity within patient's tolerance;Monitored during session;Premedicated before session;Repositioned;Ice applied     Hand Dominance Right   Extremity/Trunk Assessment Upper Extremity Assessment Upper Extremity Assessment: Overall WFL for tasks assessed   Lower Extremity Assessment Lower Extremity Assessment: Defer to PT evaluation;LLE deficits/detail LLE Deficits / Details: able to perform SLR independently; RLE [facility]   Cervical / Trunk Assessment Cervical / Trunk Assessment: Normal   Communication Communication Communication: Prefers language other than [place];Interpreter utilized   Cognition Arousal/Alertness: Awake/alert Behavior During Therapy: WFL for tasks assessed/performed Overall Cognitive Status: Within Functional Limits for tasks assessed                                     General Comments       Exercises Other Exercises Other Exercises: pt/spouse educated in [facility] uses to maximize safety and functional independence   Shoulder Instructions      Home Living Family/patient expects to be discharged to:: Private residence Living Arrangements: Spouse/significant other;Children;Other (Comment) (son) Available Help at Discharge: Family;Available 24 hours/day Type of Home: House Home Access: Stairs to enter [employer] of Steps: 3 STE from back with NO handrails; 5 STE from front with bilat handrails   Home Layout: One level     Bathroom Shower/Tub: Producer, television/film/video: Standard     Home Equipment: Cane - single point          Prior Functioning/Environment Level of Independence: Independent        Comments: Independent with ADLs/IADLs prior to surgery. No falls        OT Problem List: Decreased strength;Pain;Decreased range of motion;Decreased knowledge of use of DME or AE      OT  Treatment/Interventions: Self-care/ADL training;Therapeutic exercise;Therapeutic activities;Patient/family education    OT Goals(Current goals can be found in the care plan section) Acute Rehab OT Goals Patient Stated Goal: Return to prior function at home OT Goal Formulation: With patient/family Time For Goal Achievement: 11/09/16 Potential to Achieve Goals: Good  OT Frequency: Min 1X/week   Barriers to D/C:            Co-evaluation              AM-PAC PT "6 Clicks" Daily Activity     Outcome Measure Help from another person eating meals?: None Help from another person taking care of personal grooming?: None Help from another person toileting, which includes using toliet, bedpan, or urinal?: A Little Help from another person bathing (including washing, rinsing, drying)?: A Little Help from another person to put on and taking off regular upper body clothing?: None Help from another person to put on and taking off regular lower body clothing?: A Little 6 Click Score: 21   End of Session Equipment Utilized During Treatment: Gait belt;Rolling walker  Activity Tolerance: Patient tolerated treatment well Patient left: in chair;with call bell/phone within reach;with chair alarm set;with nursing/sitter in room;with family/visitor present;Other (comment) (polar  care in place)  OT Visit Diagnosis: Other abnormalities of gait and mobility (R26.89);Pain Pain - Right/Left: Left Pain - part of body: Knee                Time: 7169-6789 OT Time Calculation (min): 30 min Charges:  OT General Charges $OT Visit: 1 Procedure OT Evaluation $OT Eval Low Complexity: 1 Procedure OT Treatments $Self Care/Home Management : 8-22 mins G-Codes: OT G-codes **NOT FOR INPATIENT CLASS** Functional Assessment Tool Used: AM-PAC 6 Clicks Daily Activity;Clinical judgement Functional Limitation: Self care Self Care Current Status (F8101): At least 20 percent but less than 40 percent impaired, limited  or restricted Self Care Goal Status (B5102): At least 1 percent but less than 20 percent impaired, limited or restricted   Richrd Prime, MPH, MS, OTR/L ascom [phone] 10/26/16, 10:42 AM

## 2016-10-26 NOTE — Progress Notes (Signed)
Physical Therapy Treatment Patient Details Name: Gerald Hodges MRN: [id number] DOB: 1935-05-31 Today's Date: 10/26/2016    History of Present Illness Pt is a pleasant 81 yo M who underwent L TKR without reported post-op complications. OT evaluation performed on POD#1.    PT Comments    Spanish interpreter present for session. Pt reports minimal pain at rest and 5/10 with movement and stand activities. Performs transfers with supervision; without issue. Ambulation cues given for improved technique, compliance with weight bearing status and stability; improvements made fairly consistently with cues. Pt educated on stair climbing using sideways step to technique; family educated as well and demonstrate understanding. Pt/family and SW discussed follow up care and all in agreement with out patient PT. Continue PT to progress range, strength, endurance and quality of ambulation to improve all functional mobility and provide optimal, safe return home.    Follow Up Recommendations  Outpatient PT (SW has outpt appt set up. Discussed with family. All agree)     Equipment Recommendations       Recommendations for Other Services       Precautions / Restrictions Precautions Precautions: Knee Required Braces or Orthoses: Knee Immobilizer - Left Knee Immobilizer - Left: On at all times Restrictions Weight Bearing Restrictions: Yes LLE Weight Bearing: Partial weight bearing LLE Partial Weight Bearing Percentage or Pounds: 25-50    Mobility  Bed Mobility Overal bed mobility: Modified Independent             General bed mobility comments: Increased time, HOB elevated, use of bed rails  Transfers Overall transfer level: Needs assistance Equipment used: Rolling walker (2 wheeled) Transfers: Sit to/from Stand Sit to Stand: Min guard         General transfer comment: VC for hand placement on bed to push up  Ambulation/Gait Ambulation/Gait assistance:  Supervision Ambulation Distance (Feet): 70 Feet Assistive device: Rolling walker (2 wheeled) Gait Pattern/deviations: Step-to pattern;Decreased step length - left (PWB L) Gait velocity: Decreased Gait velocity interpretation: <1.8 ft/sec, indicative of risk for recurrent falls General Gait Details: Cues for decreased step length on R to allow for improved compliance with wb'g status on L. Also cues to avoid stepping to close into rw. Improved with cues    Stairs Stairs: Yes   Stair Management: One rail Left;Sideways Number of Stairs: 3 General stair comments: Pt/family instructed on stair climbing with good understanding.   Wheelchair Mobility    Modified Rankin (Stroke Patients Only)       Balance Overall balance assessment: Needs assistance Sitting-balance support: No upper extremity supported Sitting balance-Leahy Scale: Good     Standing balance support: No upper extremity supported;During functional activity Standing balance-Leahy Scale: Fair Standing balance comment: no LOB while completing donning of shorts over hips with BUE and while maintaining PWBing precautions                            Cognition Arousal/Alertness: Awake/alert Behavior During Therapy: WFL for tasks assessed/performed Overall Cognitive Status: Within Functional Limits for tasks assessed                                        Exercises Total Joint Exercises Quad Sets: Strengthening;Both;20 reps Knee Flexion: Left (several minutes) Goniometric ROM: 0-100 Other Exercises Other Exercises: pt/spouse educated in [facility] uses to maximize safety and functional independence    General  Comments        Pertinent Vitals/Pain Pain Assessment: 0-10 Pain Score: 5  (with movement/stand) Pain Location: L knee Pain Intervention(s): Monitored during session;RN gave pain meds during session;Ice applied    Home Living Family/patient expects to be discharged to:: Private  residence Living Arrangements: Spouse/significant other;Children;Other (Comment) (son) Available Help at Discharge: Family;Available 24 hours/day Type of Home: House Home Access: Stairs to enter   Home Layout: One level Home Equipment: Gilmer Mor - single point      Prior Function Level of Independence: Independent      Comments: Independent with ADLs/IADLs prior to surgery. No falls   PT Goals (current goals can now be found in the care plan section) Acute Rehab PT Goals Patient Stated Goal: Return to prior function at home Progress towards PT goals: Progressing toward goals    Frequency    BID      PT Plan Discharge plan needs to be updated    Co-evaluation              AM-PAC PT "6 Clicks" Daily Activity  Outcome Measure  Difficulty turning over in bed (including adjusting bedclothes, sheets and blankets)?: A Little Difficulty moving from lying on back to sitting on the side of the bed? : A Little Difficulty sitting down on and standing up from a chair with arms (e.g., wheelchair, bedside commode, etc,.)?: A Little Help needed moving to and from a bed to chair (including a wheelchair)?: A Little Help needed walking in hospital room?: A Little Help needed climbing 3-5 steps with a railing? : A Little 6 Click Score: 18    End of Session Equipment Utilized During Treatment: Gait belt Activity Tolerance: Patient tolerated treatment well Patient left: in chair;with call bell/phone within reach;with nursing/sitter in room;with family/visitor present;Other (comment) (polar care)   PT Visit Diagnosis: Muscle weakness (generalized) (M62.81);Pain;Difficulty in walking, not elsewhere classified (R26.2) Pain - Right/Left: Left Pain - part of body: Knee     Time: 1610-9604 PT Time Calculation (min) (ACUTE ONLY): 33 min  Charges:  $Gait Training: 8-22 mins $Therapeutic Exercise: 8-22 mins                    G Codes:        Scot Dock, PTA 10/26/2016, 10:49  AM

## 2016-10-27 LAB — CBC
HCT: 33.4 % — ABNORMAL LOW (ref 40.0–52.0)
Hemoglobin: 11.7 g/dL — ABNORMAL LOW (ref 13.0–18.0)
MCH: 31.3 pg (ref 26.0–34.0)
MCHC: 35 g/dL (ref 32.0–36.0)
MCV: 89.3 fL (ref 80.0–100.0)
Platelets: 234 10*3/uL (ref 150–440)
RBC: 3.74 MIL/uL — ABNORMAL LOW (ref 4.40–5.90)
RDW: 14.6 % — ABNORMAL HIGH (ref 11.5–14.5)
WBC: 9.3 10*3/uL (ref 3.8–10.6)

## 2016-10-27 MED ORDER — RIVAROXABAN 10 MG PO TABS: 10.0000 mg | ORAL_TABLET | Freq: Every day | ORAL | 2 refills | Status: DC

## 2016-10-27 MED ORDER — ENOXAPARIN SODIUM 40 MG/0.4ML ~~LOC~~ SOLN
40.0000 mg | SUBCUTANEOUS | Status: DC
  Administered 2016-10-27: 40 mg via SUBCUTANEOUS
  Filled 2016-10-27: qty 0.4

## 2016-10-27 MED ORDER — RIVAROXABAN 10 MG PO TABS
10.0000 mg | ORAL_TABLET | Freq: Every day | ORAL | Status: DC
  Administered 2016-10-28: 10 mg via ORAL
  Filled 2016-10-27: qty 1

## 2016-10-27 MED ORDER — HYDROCODONE-ACETAMINOPHEN 7.5-325 MG PO TABS: 1.0000 | ORAL_TABLET | Freq: Four times a day (QID) | ORAL | 0 refills | Status: DC | PRN

## 2016-10-27 NOTE — Progress Notes (Signed)
Physical Therapy Treatment Patient Details Name: Gerald Hodges MRN: [id number] DOB: Jul 15, 1935 Today's Date: 10/27/2016    History of Present Illness Pt is a pleasant 81 yo M who underwent L TKR without reported post-op complications. OT evaluation performed on POD#1.    PT Comments    Pt made excellent progress today, ambulating 110 ft with RW and completing stair training with supervision for safety.  Recommendations remain appropriate at this time.     Follow Up Recommendations  Outpatient PT (SW has outpt appt set up. Discussed with family. All agree)     Equipment Recommendations  Other (comment) (RW has already been delivered to pt's room)    Recommendations for Other Services       Precautions / Restrictions Precautions Precautions: Knee Required Braces or Orthoses: Knee Immobilizer - Left Knee Immobilizer - Left: On at all times Restrictions Weight Bearing Restrictions: Yes LLE Weight Bearing: Partial weight bearing LLE Partial Weight Bearing Percentage or Pounds: 25-50    Mobility  Bed Mobility Overal bed mobility: Modified Independent             General bed mobility comments: Pt uses bed rail but does not require any physical assist or cues.  Transfers Overall transfer level: Needs assistance Equipment used: Rolling walker (2 wheeled) Transfers: Sit to/from Stand Sit to Stand: Supervision         General transfer comment: Cues for safe hand placement.  Supervision for safety.  Ambulation/Gait Ambulation/Gait assistance: Supervision Ambulation Distance (Feet): 110 Feet Assistive device: Rolling walker (2 wheeled) Gait Pattern/deviations: Step-through pattern;Decreased step length - right;Decreased stance time - left Gait velocity: Decreased Gait velocity interpretation: Below normal speed for age/gender General Gait Details: Cues for forward gaze as pt has tendency to look down at floor.  Pt performs step through gait pattern without  cues.   Stairs Stairs: Yes   Stair Management: One rail Left;Sideways Number of Stairs: 4 General stair comments: Pt and wife instructed in safe stair ascending/descending technique.  Wife present.  Pt demonstrates safe technique with no signs of instability.  Wheelchair Mobility    Modified Rankin (Stroke Patients Only)       Balance Overall balance assessment: Needs assistance Sitting-balance support: No upper extremity supported;Feet supported Sitting balance-Leahy Scale: Good     Standing balance support: No upper extremity supported Standing balance-Leahy Scale: Fair Standing balance comment: Pt able to stand statically without UE support but relies on UE support for dynamic activities.                            Cognition Arousal/Alertness: Awake/alert Behavior During Therapy: WFL for tasks assessed/performed Overall Cognitive Status: Within Functional Limits for tasks assessed                                        Exercises Total Joint Exercises Ankle Circles/Pumps: AROM;Both;10 reps;Supine Hip ABduction/ADduction: AROM;Strengthening;Left;10 reps;Supine Straight Leg Raises: Strengthening;Left;10 reps;Supine;AAROM Knee Flexion: Left;Other (comment);AAROM;Other reps (comment);Seated (3 reps) Goniometric ROM: ~90 deg L knee flexion with AAROM exercise    General Comments General comments (skin integrity, edema, etc.): Interpreter utilized: Haydee Salter.      Pertinent Vitals/Pain Pain Assessment: 0-10 Pain Score: 4  Pain Location: L knee Pain Descriptors / Indicators: Aching;Grimacing;Guarding Pain Intervention(s): Limited activity within patient's tolerance;Monitored during session;Repositioned;Other (comment) (Polar care applied at end of session)  Home Living                      Prior Function            PT Goals (current goals can now be found in the care plan section) Acute Rehab PT Goals Patient Stated  Goal: to go home today PT Goal Formulation: With patient/family Time For Goal Achievement: 11/08/16 Potential to Achieve Goals: Good Progress towards PT goals: Progressing toward goals    Frequency    BID      PT Plan Current plan remains appropriate    Co-evaluation              AM-PAC PT "6 Clicks" Daily Activity  Outcome Measure  Difficulty turning over in bed (including adjusting bedclothes, sheets and blankets)?: None Difficulty moving from lying on back to sitting on the side of the bed? : Unable Difficulty sitting down on and standing up from a chair with arms (e.g., wheelchair, bedside commode, etc,.)?: A Little Help needed moving to and from a bed to chair (including a wheelchair)?: A Little Help needed walking in hospital room?: A Little Help needed climbing 3-5 steps with a railing? : A Little 6 Click Score: 17    End of Session Equipment Utilized During Treatment: Gait belt;Left knee immobilizer Activity Tolerance: Patient tolerated treatment well Patient left: in chair;with call bell/phone within reach;with family/visitor present;Other (comment);with chair alarm set (polar care in place) Nurse Communication: Mobility status PT Visit Diagnosis: Muscle weakness (generalized) (M62.81);Pain;Difficulty in walking, not elsewhere classified (R26.2) Pain - Right/Left: Left Pain - part of body: Knee     Time: 8592-9244 PT Time Calculation (min) (ACUTE ONLY): 36 min  Charges:  $Gait Training: 8-22 mins $Therapeutic Exercise: 8-22 mins                    G Codes:       Encarnacion Chu PT, DPT 10/27/2016, 1:22 PM

## 2016-10-27 NOTE — Progress Notes (Signed)
Anticoagulation monitoring(Lovenox):  81yo  male ordered Lovenox 30 mg Q24h  There were no vitals filed for this visit. Wt 82.6 kg  Lab Results  Component Value Date   CREATININE 1.46 (H) 10/26/2016   CREATININE 1.47 (H) 10/25/2016   CREATININE 1.38 (H) 10/16/2016   Estimated Creatinine Clearance: 42.1 mL/min (A) (by C-G formula based on SCr of 1.46 mg/dL (H)). Hemoglobin & Hematocrit     Component Value Date/Time   HGB 11.7 (L) 10/27/2016 0421   HGB 13.8 09/24/2015 0919   HCT 33.4 (L) 10/27/2016 0421   HCT 38.9 09/24/2015 0919   8/16 Hgb 11.7 Plt 256 8/17 Hgb 11.7 Plt 234 stable  Per Protocol for Patient with estCrcl >30 ml/min and BMI < 40, will transition to Lovenox 40 mg Q24h.

## 2016-10-27 NOTE — Progress Notes (Signed)
Subjective: Doesn/t feel well today.  Dizzy and indigestion.  Walked with PT  2 Days Post-Op Procedure(s) (LRB): TOTAL KNEE ARTHROPLASTY (Left)    Patient reports pain as mild.  Objective: Alert,  Up in chair.  csm good distally. Dressing dry  VITALS:   Vitals:   10/27/16 0814 10/27/16 1017  BP:  (!) 108/59  Pulse: 64 71  Resp:    Temp:    SpO2:      Neurologically intact ABD soft Neurovascular intact Sensation intact distally Intact pulses distally Dorsiflexion/Plantar flexion intact Incision: dressing C/D/I  LABS  Recent Labs  10/25/16 1137 10/26/16 0350 10/27/16 0421  HGB 13.4 11.7* 11.7*  HCT 38.4* 34.1* 33.4*  WBC 9.3 13.4* 9.3  PLT 269 256 234     Recent Labs  10/25/16 1137 10/26/16 0350  NA  --  132*  K  --  4.7  BUN  --  27*  CREATININE 1.47* 1.46*  GLUCOSE  --  126*    No results for input(s): LABPT, INR in the last 72 hours.   Assessment/Plan: 2 Days Post-Op Procedure(s) (LRB): TOTAL KNEE ARTHROPLASTY (Left)   Up with therapy Plan for discharge tomorrow

## 2016-10-27 NOTE — Discharge Summary (Signed)
Physician Discharge Summary  Patient ID: Gerald Hodges MRN: [id number] DOB/AGE: 1935-12-30 81 y.o.  Admit date: 10/25/2016 Discharge date: 10/27/2016  Admission Diagnoses: Arthritis left knee  Discharge Diagnoses: same  Active Problems:   Total knee replacement status   Discharged Condition: good  Hospital Course: left total knee on 10/25/16 without complication.  Minimal pain post op.  Started PT with walker.  Felt dizzy and unwell on 10/27/16 so d/c delayed to 10/28/16.  Wounds benign. Patient claims allergy to ASA including trouble breathing so will d/c him on xarelto for 6 weeks.  Consults: None  Significant Diagnostic Studies: radiology: X-Ray: Satisfactory post op knee  Treatments: antibiotics: vancomycin  Discharge Exam: Blood pressure (!) 108/59, pulse 71, temperature 98.2 F (36.8 C), temperature source Oral, resp. rate 18, SpO2 94 %.   csm good left leg.  dressing dry.    Disposition: Final discharge disposition not confirmed  Discharge Instructions    Call MD for:  persistant nausea and vomiting    Complete by:  As directed    Call MD for:  redness, tenderness, or signs of infection (pain, swelling, redness, odor or green/yellow discharge around incision site)    Complete by:  As directed    Call MD for:  severe uncontrolled pain    Complete by:  As directed    Call MD for:  temperature >100.4    Complete by:  As directed    Diet general    Complete by:  As directed    Increase activity slowly    Complete by:  As directed    Leave dressing on - Keep it clean, dry, and intact until clinic visit    Complete by:  As directed    Tens unit    Complete by:  As directed      Allergies as of 10/27/2016      Reactions   Aspirin Cough, Other (See Comments)   Lisinopril Other (See Comments)   Throat itching Throat itching Throat itching      Medication List    STOP taking these medications   aspirin 81 MG tablet   predniSONE 20 MG  tablet Commonly known as:  DELTASONE   UNABLE TO FIND     TAKE these medications   acetaminophen 500 MG tablet Commonly known as:  TYLENOL Take 500 mg by mouth every 6 (six) hours as needed.   acetaminophen-codeine 300-60 MG tablet Commonly known as:  TYLENOL #4 Take 1 tablet by mouth every 6 (six) hours as needed for moderate pain.   allopurinol 100 MG tablet Commonly known as:  ZYLOPRIM take 1 tablet by mouth once daily   amLODipine 10 MG tablet Commonly known as:  NORVASC Take 1 tablet (10 mg total) by mouth daily.   atorvastatin 40 MG tablet Commonly known as:  LIPITOR Take 40 mg by mouth daily.   carvedilol 6.25 MG tablet Commonly known as:  COREG Take 1 tablet (6.25 mg total) by mouth 2 (two) times daily with a meal.   colchicine 0.6 MG tablet Take 1 tablet (0.6 mg total) by mouth daily.   docusate sodium 100 MG capsule Commonly known as:  COLACE Take 1 capsule (100 mg total) by mouth 2 (two) times daily.   fluticasone 50 MCG/ACT nasal spray Commonly known as:  FLONASE Place 2 sprays into both nostrils daily.   furosemide 40 MG tablet Commonly known as:  LASIX Take 1 tablet (40 mg total) by mouth 2 (two) times daily. Reported on  06/28/2015   HYDROcodone-acetaminophen 10-325 MG tablet Commonly known as:  NORCO Take 1 tablet by mouth every 6 (six) hours as needed. What changed:  Another medication with the same name was added. Make sure you understand how and when to take each.   HYDROcodone-acetaminophen 7.5-325 MG tablet Commonly known as:  NORCO Take 1 tablet by mouth every 6 (six) hours as needed for moderate pain. What changed:  You were already taking a medication with the same name, and this prescription was added. Make sure you understand how and when to take each.   isosorbide mononitrate 60 MG 24 hr tablet Commonly known as:  IMDUR Take 1 tablet (60 mg total) by mouth daily.   losartan 50 MG tablet Commonly known as:  COZAAR take 1 tablet by  mouth once daily   nitroGLYCERIN 0.4 MG SL tablet Commonly known as:  NITROSTAT Place 1 tablet (0.4 mg total) under the tongue every 5 (five) minutes as needed for chest pain (chest pain).   omeprazole 20 MG capsule Commonly known as:  PRILOSEC Take 1 capsule (20 mg total) by mouth daily.   promethazine 25 MG tablet Commonly known as:  PHENERGAN Take 25 mg by mouth every 8 (eight) hours as needed for nausea or vomiting.   rivaroxaban 10 MG Tabs tablet Commonly known as:  XARELTO Take 1 tablet (10 mg total) by mouth daily.            Durable Medical Equipment        Start     Ordered   10/27/16 1256  DME 3-in-1  Once     10/27/16 1259   10/25/16 1130  DME Walker rolling  Once    Question:  Patient needs a walker to treat with the following condition  Answer:  Total knee replacement status   10/25/16 1129     Follow-up Information    Deeann Saint, MD. Schedule an appointment as soon as possible for a visit in 1 week(s).   Specialty:  Specialist Contact information: [address] [place] [address] [phone]           Signed: Valinda Hoar 10/27/2016, 1:05 PM

## 2016-10-28 LAB — CBC
HCT: 30.6 % — ABNORMAL LOW (ref 40.0–52.0)
Hemoglobin: 10.9 g/dL — ABNORMAL LOW (ref 13.0–18.0)
MCH: 32.3 pg (ref 26.0–34.0)
MCHC: 35.5 g/dL (ref 32.0–36.0)
MCV: 90.8 fL (ref 80.0–100.0)
Platelets: 212 10*3/uL (ref 150–440)
RBC: 3.37 MIL/uL — ABNORMAL LOW (ref 4.40–5.90)
RDW: 14.6 % — ABNORMAL HIGH (ref 11.5–14.5)
WBC: 9.3 10*3/uL (ref 3.8–10.6)

## 2016-10-28 NOTE — Care Management Note (Addendum)
Case Management Note  Patient Details  Name: Gerald Hodges MRN: [id number] Date of Birth: [birth year]  Subjective/Objective:   E-mail to [facility]. Outpatient PT set up and provided by [facility] per weekday CM note. DME delivered by [facility]..               Action/Plan:   Expected Discharge Date:  10/28/16               Expected Discharge Plan:  OP Rehab (Previously set up with [facility])  In-House Referral:     Discharge planning Services  CM Consult  Post Acute Care Choice:  Durable Medical Equipment Choice offered to:  Patient, Adult Children  DME Arranged:  3-N-1 DME Agency:  (S)  ([facility])  Delivered by [facility]  HH Arranged:  PT HH Agency:   ([facility])  Status of Service:  Completed, signed off  If discussed at Long Length of Stay Meetings, dates discussed:    Additional Comments:  Tavionna Grout A, RN 10/28/2016, 10:18 AM

## 2016-10-28 NOTE — Progress Notes (Signed)
Physical Therapy Treatment Patient Details Name: Gerald Hodges MRN: [id number] DOB: 1935/03/19 Today's Date: 10/28/2016    History of Present Illness Pt is a pleasant 81 yo M who underwent L TKR without reported post-op complications. OT evaluation performed on POD#1.    PT Comments    Participated in exercises as described below.  Pt able to ambulate to and from rehab gym and up/down stairs with left rail.  He did require one seated rest break.  Overall progressing well.  Questions answered.  In house Spanish interpreter, Asaias, in attendance for session.   Follow Up Recommendations  Outpatient PT     Equipment Recommendations       Recommendations for Other Services       Precautions / Restrictions Precautions Precautions: Knee Required Braces or Orthoses: Knee Immobilizer - Left Knee Immobilizer - Left: On at all times Restrictions Weight Bearing Restrictions: Yes LLE Weight Bearing: Partial weight bearing LLE Partial Weight Bearing Percentage or Pounds: 25-50    Mobility  Bed Mobility Overal bed mobility: Modified Independent             General bed mobility comments: Pt uses bed rail but does not require any physical assist or cues.  Transfers Overall transfer level: Needs assistance Equipment used: Rolling walker (2 wheeled) Transfers: Sit to/from Stand Sit to Stand: Supervision         General transfer comment: Cues for safe hand placement.  Supervision for safety.  Ambulation/Gait Ambulation/Gait assistance: Supervision Ambulation Distance (Feet): 160 Feet Assistive device: Rolling walker (2 wheeled) Gait Pattern/deviations: Step-through pattern;Decreased step length - right;Decreased stance time - left     General Gait Details: Cues for forward gaze as pt has tendency to look down at floor.  Pt performs step through gait pattern without cues.   Stairs Stairs: Yes   Stair Management: One rail Left;Sideways Number of Stairs:  4 General stair comments: Pt and wife instructed in safe stair ascending/descending technique.  Wife present.  Pt demonstrates safe technique with no signs of instability.  Wheelchair Mobility    Modified Rankin (Stroke Patients Only)       Balance Overall balance assessment: Needs assistance Sitting-balance support: No upper extremity supported;Feet supported Sitting balance-Leahy Scale: Good     Standing balance support: No upper extremity supported Standing balance-Leahy Scale: Fair Standing balance comment: Pt able to stand statically without UE support but relies on UE support for dynamic activities.                            Cognition Arousal/Alertness: Awake/alert Behavior During Therapy: WFL for tasks assessed/performed Overall Cognitive Status: Within Functional Limits for tasks assessed                                        Exercises Total Joint Exercises Ankle Circles/Pumps: AROM;Both;10 reps;Supine Quad Sets: Strengthening;Both;20 reps Towel Squeeze: Strengthening;Both;10 reps;Supine Hip ABduction/ADduction: AROM;Strengthening;Left;10 reps;Supine Straight Leg Raises: Strengthening;Left;10 reps;Supine;AAROM [employer]: Strengthening;Left;10 reps;Seated Knee Flexion: Left;Other (comment);AAROM;Other reps (comment);Seated Goniometric ROM: 0-87 Other Exercises Other Exercises: reviewed HEP and instructions for home.    General Comments        Pertinent Vitals/Pain Pain Assessment: 0-10 Pain Score: 4  Pain Location: L knee Pain Descriptors / Indicators: Aching;Grimacing;Operative site guarding;Sore Pain Intervention(s): Limited activity within patient's tolerance;Ice applied;Premedicated before session    Home Living  Prior Function            PT Goals (current goals can now be found in the care plan section) Progress towards PT goals: Progressing toward goals    Frequency     BID      PT Plan Current plan remains appropriate    Co-evaluation              AM-PAC PT "6 Clicks" Daily Activity  Outcome Measure  Difficulty turning over in bed (including adjusting bedclothes, sheets and blankets)?: None Difficulty moving from lying on back to sitting on the side of the bed? : None Difficulty sitting down on and standing up from a chair with arms (e.g., wheelchair, bedside commode, etc,.)?: A Little Help needed moving to and from a bed to chair (including a wheelchair)?: A Little Help needed walking in hospital room?: A Little Help needed climbing 3-5 steps with a railing? : A Little 6 Click Score: 20    End of Session Equipment Utilized During Treatment: Gait belt;Left knee immobilizer Activity Tolerance: Patient tolerated treatment well Patient left: in chair;with call bell/phone within reach;with family/visitor present Nurse Communication: Mobility status Pain - Right/Left: Left Pain - part of body: Knee     Time: 1610-9604 PT Time Calculation (min) (ACUTE ONLY): 37 min  Charges:  $Gait Training: 8-22 mins $Therapeutic Exercise: 8-22 mins                    G Codes:       Danielle Dess, PTA 10/28/16, 10:57 AM

## 2016-10-28 NOTE — Progress Notes (Signed)
Pt discharging to home, interpreter at bedside for discharge instructions, reviewed with pt and wife. Pt participated with PT this AM, no dizziness or weakness today, stated he feels much better. Pain is well controlled with Norco. Pt to f/u with out-pt PT. RW at bedside.

## 2016-12-29 ENCOUNTER — Ambulatory Visit (INDEPENDENT_AMBULATORY_CARE_PROVIDER_SITE_OTHER): Payer: Medicaid Other

## 2016-12-29 DIAGNOSIS — Z23 Encounter for immunization: Secondary | ICD-10-CM | POA: Diagnosis not present

## 2017-02-02 ENCOUNTER — Ambulatory Visit: Payer: Medicaid Other | Admitting: Unknown Physician Specialty

## 2017-02-09 ENCOUNTER — Other Ambulatory Visit: Payer: Self-pay

## 2017-02-09 DIAGNOSIS — M109 Gout, unspecified: Secondary | ICD-10-CM

## 2017-02-09 MED ORDER — COLCHICINE 0.6 MG PO TABS: 0.6000 mg | ORAL_TABLET | Freq: Every day | ORAL | 5 refills | Status: DC

## 2017-02-09 NOTE — Telephone Encounter (Signed)
Patient last seen 09/20/16. Current RX says print, can we change to normal please?

## 2017-03-19 ENCOUNTER — Ambulatory Visit (INDEPENDENT_AMBULATORY_CARE_PROVIDER_SITE_OTHER): Payer: Medicaid Other | Admitting: Unknown Physician Specialty

## 2017-03-19 ENCOUNTER — Encounter: Payer: Self-pay | Admitting: Unknown Physician Specialty

## 2017-03-19 VITALS — BP 154/73 | HR 80 | Temp 98.4°F | Wt 189.4 lb

## 2017-03-19 DIAGNOSIS — I2510 Atherosclerotic heart disease of native coronary artery without angina pectoris: Secondary | ICD-10-CM

## 2017-03-19 DIAGNOSIS — I10 Essential (primary) hypertension: Secondary | ICD-10-CM | POA: Diagnosis not present

## 2017-03-19 DIAGNOSIS — N183 Chronic kidney disease, stage 3 unspecified: Secondary | ICD-10-CM

## 2017-03-19 DIAGNOSIS — I2089 Other forms of angina pectoris: Secondary | ICD-10-CM

## 2017-03-19 DIAGNOSIS — M109 Gout, unspecified: Secondary | ICD-10-CM | POA: Diagnosis not present

## 2017-03-19 DIAGNOSIS — I208 Other forms of angina pectoris: Secondary | ICD-10-CM | POA: Diagnosis not present

## 2017-03-19 DIAGNOSIS — M7989 Other specified soft tissue disorders: Secondary | ICD-10-CM | POA: Diagnosis not present

## 2017-03-19 MED ORDER — AMLODIPINE BESYLATE 10 MG PO TABS: 10.0000 mg | ORAL_TABLET | Freq: Every day | ORAL | 1 refills | Status: DC

## 2017-03-19 MED ORDER — FUROSEMIDE 40 MG PO TABS: 40.0000 mg | ORAL_TABLET | Freq: Two times a day (BID) | ORAL | 1 refills | Status: DC

## 2017-03-19 MED ORDER — ALLOPURINOL 100 MG PO TABS: 100.0000 mg | ORAL_TABLET | Freq: Every day | ORAL | 1 refills | Status: DC

## 2017-03-19 MED ORDER — LOSARTAN POTASSIUM 50 MG PO TABS: 50.0000 mg | ORAL_TABLET | Freq: Every day | ORAL | 1 refills | Status: DC

## 2017-03-19 MED ORDER — ATORVASTATIN CALCIUM 40 MG PO TABS: 40.0000 mg | ORAL_TABLET | Freq: Every day | ORAL | 1 refills | Status: DC

## 2017-03-19 MED ORDER — OMEPRAZOLE 20 MG PO CPDR: 20.0000 mg | DELAYED_RELEASE_CAPSULE | Freq: Every day | ORAL | 3 refills | Status: DC

## 2017-03-19 MED ORDER — CARVEDILOL 6.25 MG PO TABS: 6.2500 mg | ORAL_TABLET | Freq: Two times a day (BID) | ORAL | 1 refills | Status: DC

## 2017-03-19 MED ORDER — ISOSORBIDE MONONITRATE ER 60 MG PO TB24: 60.0000 mg | ORAL_TABLET | Freq: Every day | ORAL | 1 refills | Status: DC

## 2017-03-19 NOTE — Assessment & Plan Note (Signed)
Poor control.  Out of Allopurinol  Restart.

## 2017-03-19 NOTE — Assessment & Plan Note (Signed)
Not to goal.  Restart Losartan

## 2017-03-19 NOTE — Progress Notes (Signed)
BP (!) 154/73   Pulse 80   Temp 98.4 F (36.9 C) (Oral)   Wt 189 lb 6.4 oz (85.9 kg)   SpO2 98%   BMI 30.57 kg/m    Subjective:    Patient ID: Gerald Hodges, male    DOB: 04/29/1935, 82 y.o.   MRN: [id number]  HPI: Gerald Hodges is a 82 y.o. male  Chief Complaint  Patient presents with  . Leg Swelling    pt states that since August, his legs from his knees down have been swelling when walking  . Ear Pain    pt states his ears have been bothering him    The medical history and exam was done with the assistance of a medical interpreter.  Leaving on the 21st for 4 months and would like a refill of meds for 4 months.  CC today of leg swelling and pain.    Pt is lost to f/u for a number of other medical issues:  leg pain Pt states he is having swelling and pain in bilateral legs from the knee down.  Better in the AM and worse as the days goes on.  This has been going on since knee replacements.  Problem is getting worse.    Also lost to f/u for the following medical problems:    Gout Suffers gout bilateral feet.  Takes Allopurinol.  Medicaid is not paying for the colchicine.  He has multiple gout attacks requiring the use of steroids.  Pt does not drink.  He is wondering if something in his diet can help.    Hypertension Using medications without difficulty but does not seem to be taking Losartan Average home BPs Not checking                    No problems or lightheadedness No chest pain with exertion or shortness of breath No Edema   Hyperlipidemia Using medications without problems No Muscle aches  Diet compliance:Questions about his diet Exercise: little due to leg pain  CKD Last GFR was 44.  Needs recheck  CAD No chest pain or SOB.  Not taking Imdur.  Lost to f/u with cardiology.    Social History   Socioeconomic History  . Marital status: Married    Spouse name: Not on file  . Number of children: Not on file  . Years of  education: Not on file  . Highest education level: Not on file  Social Needs  . Financial resource strain: Not on file  . Food insecurity - worry: Not on file  . Food insecurity - inability: Not on file  . Transportation needs - medical: Not on file  . Transportation needs - non-medical: Not on file  Occupational History  . Not on file  Tobacco Use  . Smoking status: Never Smoker  . Smokeless tobacco: Never Used  Substance and Sexual Activity  . Alcohol use: No    Alcohol/week: 1.2 oz    Types: 2 Standard drinks or equivalent per week  . Drug use: No  . Sexual activity: Not Currently  Other Topics Concern  . Not on file  Social History Narrative  . Not on file     Relevant past medical, surgical, family and social history reviewed and updated as indicated. Interim medical history since our last visit reviewed. Allergies and medications reviewed and updated.  Review of Systems  Per HPI unless specifically indicated above     Objective:    BP Marland Kitchen)  154/73   Pulse 80   Temp 98.4 F (36.9 C) (Oral)   Wt 189 lb 6.4 oz (85.9 kg)   SpO2 98%   BMI 30.57 kg/m   Wt Readings from Last 3 Encounters:  03/19/17 189 lb 6.4 oz (85.9 kg)  10/16/16 182 lb (82.6 kg)  09/20/16 187 lb 9.6 oz (85.1 kg)    Physical Exam  Constitutional: He is oriented to person, place, and time. He appears well-developed and well-nourished. No distress.  HENT:  Head: Normocephalic and atraumatic.  Eyes: Conjunctivae and lids are normal. Right eye exhibits no discharge. Left eye exhibits no discharge. No scleral icterus.  Neck: Normal range of motion. Neck supple. No JVD present. Carotid bruit is not present.  Cardiovascular: Normal rate, regular rhythm and normal heart sounds.  Pulmonary/Chest: Effort normal and breath sounds normal. No respiratory distress.  Abdominal: Normal appearance. There is no splenomegaly or hepatomegaly.  Musculoskeletal: Normal range of motion.  Pt with bilateral swelling.   No pitting at this time.  Capillary refill poor  Neurological: He is alert and oriented to person, place, and time.  Skin: Skin is warm, dry and intact. No rash noted. No pallor.  Psychiatric: He has a normal mood and affect. His behavior is normal. Judgment and thought content normal.    Results for orders placed or performed during the hospital encounter of 10/25/16  CBC  Result Value Ref Range   WBC 9.3 3.8 - 10.6 K/uL   RBC 4.26 (L) 4.40 - 5.90 MIL/uL   Hemoglobin 13.4 13.0 - 18.0 g/dL   HCT 40.9 (L) 81.1 - 91.4 %   MCV 90.0 80.0 - 100.0 fL   MCH 31.5 26.0 - 34.0 pg   MCHC 35.0 32.0 - 36.0 g/dL   RDW 78.2 (H) 95.6 - 21.3 %   Platelets 269 150 - 440 K/uL  Creatinine, serum  Result Value Ref Range   Creatinine, Ser 1.47 (H) 0.61 - 1.24 mg/dL   GFR calc non Af Amer 44 (L) >60 mL/min   GFR calc Af Amer 51 (L) >60 mL/min  CBC  Result Value Ref Range   WBC 13.4 (H) 3.8 - 10.6 K/uL   RBC 3.79 (L) 4.40 - 5.90 MIL/uL   Hemoglobin 11.7 (L) 13.0 - 18.0 g/dL   HCT 08.6 (L) 57.8 - 46.9 %   MCV 89.8 80.0 - 100.0 fL   MCH 30.9 26.0 - 34.0 pg   MCHC 34.4 32.0 - 36.0 g/dL   RDW 62.9 52.8 - 41.3 %   Platelets 256 150 - 440 K/uL  Basic metabolic panel  Result Value Ref Range   Sodium 132 (L) 135 - 145 mmol/L   Potassium 4.7 3.5 - 5.1 mmol/L   Chloride 100 (L) 101 - 111 mmol/L   CO2 27 22 - 32 mmol/L   Glucose, Bld 126 (H) 65 - 99 mg/dL   BUN 27 (H) 6 - 20 mg/dL   Creatinine, Ser 2.44 (H) 0.61 - 1.24 mg/dL   Calcium 8.3 (L) 8.9 - 10.3 mg/dL   GFR calc non Af Amer 44 (L) >60 mL/min   GFR calc Af Amer 51 (L) >60 mL/min   Anion gap 5 5 - 15  CBC  Result Value Ref Range   WBC 9.3 3.8 - 10.6 K/uL   RBC 3.74 (L) 4.40 - 5.90 MIL/uL   Hemoglobin 11.7 (L) 13.0 - 18.0 g/dL   HCT 01.0 (L) 27.2 - 53.6 %   MCV 89.3 80.0 - 100.0  fL   MCH 31.3 26.0 - 34.0 pg   MCHC 35.0 32.0 - 36.0 g/dL   RDW 16.1 (H) 09.6 - 04.5 %   Platelets 234 150 - 440 K/uL  CBC  Result Value Ref Range   WBC 9.3  3.8 - 10.6 K/uL   RBC 3.37 (L) 4.40 - 5.90 MIL/uL   Hemoglobin 10.9 (L) 13.0 - 18.0 g/dL   HCT 40.9 (L) 81.1 - 91.4 %   MCV 90.8 80.0 - 100.0 fL   MCH 32.3 26.0 - 34.0 pg   MCHC 35.5 32.0 - 36.0 g/dL   RDW 78.2 (H) 95.6 - 21.3 %   Platelets 212 150 - 440 K/uL  ABO/Rh  Result Value Ref Range   ABO/RH(D) O POS       Assessment & Plan:   Problem List Items Addressed This Visit      Unprioritized   CAD (coronary artery disease)    Chart review shows last visit at [facility] was 9 months ago.  Will refer to a local cardiologist      Relevant Medications   losartan (COZAAR) 50 MG tablet   isosorbide mononitrate (IMDUR) 60 MG 24 hr tablet   carvedilol (COREG) 6.25 MG tablet   atorvastatin (LIPITOR) 40 MG tablet   furosemide (LASIX) 40 MG tablet   amLODipine (NORVASC) 10 MG tablet   Other Relevant Orders   Ambulatory referral to Cardiology   Chronic kidney disease, stage III (moderate) (HCC)    Check GFR      Essential hypertension, benign    Not to goal.  Restart Losartan      Relevant Medications   losartan (COZAAR) 50 MG tablet   isosorbide mononitrate (IMDUR) 60 MG 24 hr tablet   carvedilol (COREG) 6.25 MG tablet   atorvastatin (LIPITOR) 40 MG tablet   furosemide (LASIX) 40 MG tablet   amLODipine (NORVASC) 10 MG tablet   Other Relevant Orders   Lipid Panel w/o Chol/HDL Ratio   Gout    Poor control.  Out of Allopurinol  Restart.        Relevant Medications   allopurinol (ZYLOPRIM) 100 MG tablet   Stable angina (HCC)   Relevant Medications   losartan (COZAAR) 50 MG tablet   isosorbide mononitrate (IMDUR) 60 MG 24 hr tablet   carvedilol (COREG) 6.25 MG tablet   atorvastatin (LIPITOR) 40 MG tablet   furosemide (LASIX) 40 MG tablet   amLODipine (NORVASC) 10 MG tablet   Other Relevant Orders   Ambulatory referral to Cardiology    Other Visit Diagnoses    Leg swelling    -  Primary   Bilateral swelling.  Suspect vascular.  Refer to vascular for further evaluation    Relevant Orders   Comprehensive metabolic panel   Chronic kidney disease, stage 3 (HCC)       Relevant Medications   furosemide (LASIX) 40 MG tablet   Other Relevant Orders   Comprehensive metabolic panel   CBC with Differential/Platelet       Follow up plan: Return in about 10 days (around 03/29/2017).

## 2017-03-19 NOTE — Assessment & Plan Note (Signed)
Check GFR 

## 2017-03-19 NOTE — Assessment & Plan Note (Signed)
Chart review shows last visit at [facility] was 9 months ago.  Will refer to a local cardiologist

## 2017-03-20 LAB — CBC WITH DIFFERENTIAL/PLATELET
Basophils Absolute: 0 10*3/uL (ref 0.0–0.2)
Basos: 0 %
EOS (ABSOLUTE): 0.2 10*3/uL (ref 0.0–0.4)
Eos: 4 %
Hematocrit: 41.4 % (ref 37.5–51.0)
Hemoglobin: 13.8 g/dL (ref 13.0–17.7)
Immature Grans (Abs): 0 10*3/uL (ref 0.0–0.1)
Immature Granulocytes: 0 %
Lymphocytes Absolute: 2.2 10*3/uL (ref 0.7–3.1)
Lymphs: 49 %
MCH: 29.1 pg (ref 26.6–33.0)
MCHC: 33.3 g/dL (ref 31.5–35.7)
MCV: 87 fL (ref 79–97)
Monocytes Absolute: 0.4 10*3/uL (ref 0.1–0.9)
Monocytes: 8 %
Neutrophils Absolute: 1.7 10*3/uL (ref 1.4–7.0)
Neutrophils: 39 %
Platelets: 287 10*3/uL (ref 150–379)
RBC: 4.74 x10E6/uL (ref 4.14–5.80)
RDW: 15.2 % (ref 12.3–15.4)
WBC: 4.5 10*3/uL (ref 3.4–10.8)

## 2017-03-20 LAB — LIPID PANEL W/O CHOL/HDL RATIO
Cholesterol, Total: 94 mg/dL — ABNORMAL LOW (ref 100–199)
HDL: 32 mg/dL — ABNORMAL LOW
LDL Calculated: 32 mg/dL (ref 0–99)
Triglycerides: 149 mg/dL (ref 0–149)
VLDL Cholesterol Cal: 30 mg/dL (ref 5–40)

## 2017-03-20 LAB — COMPREHENSIVE METABOLIC PANEL
ALT: 25 IU/L (ref 0–44)
AST: 29 IU/L (ref 0–40)
Albumin/Globulin Ratio: 1.6 (ref 1.2–2.2)
Albumin: 4.6 g/dL (ref 3.5–4.8)
Alkaline Phosphatase: 78 IU/L (ref 39–117)
BUN/Creatinine Ratio: 16 (ref 10–24)
BUN: 21 mg/dL (ref 8–27)
Bilirubin Total: 1.7 mg/dL — ABNORMAL HIGH (ref 0.0–1.2)
CO2: 23 mmol/L (ref 20–29)
Calcium: 9.8 mg/dL (ref 8.6–10.2)
Chloride: 100 mmol/L (ref 96–106)
Creatinine, Ser: 1.32 mg/dL — ABNORMAL HIGH (ref 0.76–1.27)
GFR calc Af Amer: 59 mL/min/{1.73_m2} — ABNORMAL LOW (ref >59)
GFR calc non Af Amer: 51 mL/min/{1.73_m2} — ABNORMAL LOW (ref >59)
Globulin, Total: 2.8 g/dL (ref 1.5–4.5)
Glucose: 127 mg/dL — ABNORMAL HIGH (ref 65–99)
Potassium: 3.9 mmol/L (ref 3.5–5.2)
Sodium: 140 mmol/L (ref 134–144)
Total Protein: 7.4 g/dL (ref 6.0–8.5)

## 2017-03-27 ENCOUNTER — Telehealth: Payer: Self-pay | Admitting: Unknown Physician Specialty

## 2017-03-27 DIAGNOSIS — I10 Essential (primary) hypertension: Secondary | ICD-10-CM

## 2017-03-27 DIAGNOSIS — N183 Chronic kidney disease, stage 3 unspecified: Secondary | ICD-10-CM

## 2017-03-27 DIAGNOSIS — M109 Gout, unspecified: Secondary | ICD-10-CM

## 2017-03-27 MED ORDER — FUROSEMIDE 40 MG PO TABS: 40.0000 mg | ORAL_TABLET | Freq: Two times a day (BID) | ORAL | 1 refills | Status: DC

## 2017-03-27 MED ORDER — COLCHICINE 0.6 MG PO TABS: 0.6000 mg | ORAL_TABLET | Freq: Every day | ORAL | 1 refills | Status: DC

## 2017-03-27 NOTE — Telephone Encounter (Signed)
Pt's son has been notified.

## 2017-03-27 NOTE — Telephone Encounter (Signed)
Looked through patient's med list and it looks like the only prescriptions that patient should run out of before April would be the colchicine and furosemide. Can we change these to 90 day supplies and send to [facility] for the patient?

## 2017-03-27 NOTE — Telephone Encounter (Signed)
pts son, Trinna Post came in and explained that his father would be going out of town on Monday 05/03/17. He stated that he came in and only got a months supply and would like to request a 3 month supply to last him until he returns in April.

## 2017-03-28 ENCOUNTER — Ambulatory Visit: Payer: Self-pay | Admitting: Unknown Physician Specialty

## 2017-10-03 DIAGNOSIS — M5417 Radiculopathy, lumbosacral region: Secondary | ICD-10-CM | POA: Diagnosis not present

## 2017-10-10 DIAGNOSIS — N183 Chronic kidney disease, stage 3 (moderate): Secondary | ICD-10-CM | POA: Diagnosis not present

## 2017-10-10 DIAGNOSIS — I10 Essential (primary) hypertension: Secondary | ICD-10-CM | POA: Diagnosis not present

## 2017-10-10 DIAGNOSIS — R600 Localized edema: Secondary | ICD-10-CM | POA: Diagnosis not present

## 2017-11-11 ENCOUNTER — Other Ambulatory Visit: Payer: Self-pay | Admitting: Unknown Physician Specialty

## 2017-11-11 DIAGNOSIS — M109 Gout, unspecified: Secondary | ICD-10-CM

## 2017-11-13 ENCOUNTER — Other Ambulatory Visit: Payer: Self-pay | Admitting: *Deleted

## 2017-11-13 DIAGNOSIS — M109 Gout, unspecified: Secondary | ICD-10-CM

## 2017-11-13 MED ORDER — COLCHICINE 0.6 MG PO TABS: 0.6000 mg | ORAL_TABLET | Freq: Every day | ORAL | 1 refills | Status: DC

## 2017-11-13 MED ORDER — ISOSORBIDE MONONITRATE ER 60 MG PO TB24: 60.0000 mg | ORAL_TABLET | Freq: Every day | ORAL | 1 refills | Status: DC

## 2017-11-21 ENCOUNTER — Ambulatory Visit: Payer: Medicaid Other | Admitting: Internal Medicine

## 2017-11-21 ENCOUNTER — Encounter: Payer: Self-pay | Admitting: Internal Medicine

## 2017-11-21 VITALS — BP 138/80 | HR 63 | Wt 188.8 lb

## 2017-11-21 DIAGNOSIS — I208 Other forms of angina pectoris: Secondary | ICD-10-CM | POA: Diagnosis not present

## 2017-11-21 DIAGNOSIS — E785 Hyperlipidemia, unspecified: Secondary | ICD-10-CM

## 2017-11-21 DIAGNOSIS — M79605 Pain in left leg: Secondary | ICD-10-CM | POA: Diagnosis not present

## 2017-11-21 DIAGNOSIS — I10 Essential (primary) hypertension: Secondary | ICD-10-CM

## 2017-11-21 DIAGNOSIS — R600 Localized edema: Secondary | ICD-10-CM

## 2017-11-21 DIAGNOSIS — R200 Anesthesia of skin: Secondary | ICD-10-CM | POA: Insufficient documentation

## 2017-11-21 DIAGNOSIS — I2510 Atherosclerotic heart disease of native coronary artery without angina pectoris: Secondary | ICD-10-CM | POA: Diagnosis not present

## 2017-11-21 MED ORDER — CLOPIDOGREL BISULFATE 75 MG PO TABS: 75.0000 mg | ORAL_TABLET | Freq: Every day | ORAL | 3 refills | Status: DC

## 2017-11-21 MED ORDER — NITROGLYCERIN 0.4 MG SL SUBL: 0.4000 mg | SUBLINGUAL_TABLET | SUBLINGUAL | 3 refills | Status: DC | PRN

## 2017-11-21 NOTE — Progress Notes (Signed)
New Outpatient Visit Date: 11/21/2017  Referring Provider: Gabriel Cirri, NP [address], [place] [address]  Chief Complaint: Establish cardiology care  HPI:  [name] is a 82 y.o. male who is being seen today for the evaluation of angina at the request of Ms. Wicker. He has a history of chronic angina and coronary artery disease based on abnormal myocardial perfusion stress tests at [facility], hypertension, hyperlipidemia, GERD, TIA, and CKD stage III.  I actually cared for [name] during my fellowship at [facility].  He had initially presented with chest pain shortly after traveling to the [place].  Myocardial perfusion stress test was equivocal but without high risk ischemia.  Due to acute on chronic kidney injury during that admission, decision was made to continue with medical therapy.  Today, the patient presents to reestablish cardiology care his prior PCP has switched practices.  [name] reports that he has been feeling well with the exception of intermittent left leg pain and numbness.  This has been present for the last 4 months and occurs when walking extended distances or lying still at night.  He has not had any wounds on either leg.  Pain is sharp and radiates from the hip all the way down to his foot.  He notes intermittent lower extremity edema, which has been longstanding.  Patient also denies chest pain, shortness of breath, palpitations, lightheadedness, orthopnea, and PND.  He has been compliant with his medications, though it appears that he is no long taking atorvastatin.  --------------------------------------------------------------------------------------------------  Cardiovascular History & Procedures: Cardiovascular Problems:  Stable angina  Left leg pain and numbness  Risk Factors:  Suspected coronary artery disease, hypertension, hyperlipidemia, male gender, and age greater than 60  Cath/PCI:  None  CV Surgery:  None  EP  Procedures and Devices:  None  Non-Invasive Evaluation(s):  Pharmacologic MPI (10/23/2016, [facility]): Low risk study without significant ischemia.  Small in size, subtle in severity, fixed mid inferolateral and basal lateral defect consistent with possible artifact but cannot rule out subtle scar.  Normal LVEF (56%).  No significant coronary artery calcification.  Study unchanged since 10/08/2012.  Recent CV Pertinent Labs: Lab Results  Component Value Date   CHOL 94 (L) 03/19/2017   HDL 32 (L) 03/19/2017   LDLCALC 32 03/19/2017   TRIG 149 03/19/2017   CHOLHDL 2.8 07/21/2016   INR 1.03 10/16/2016   K 3.9 03/19/2017   BUN 21 03/19/2017   CREATININE 1.32 (H) 03/19/2017    --------------------------------------------------------------------------------------------------  Past Medical History:  Diagnosis Date  . Arthritis Left knee  . Chronic kidney disease    STAGE 3  . Coronary artery disease   . GERD (gastroesophageal reflux disease)   . Gout   . History of kidney problems   . Hyperlipemia 08/18/2014  . Hyperlipidemia   . Hypertension   . TIA (transient ischemic attack)     Past Surgical History:  Procedure Laterality Date  . BUNIONECTOMY Bilateral   . EYE SURGERY Bilateral    Cataractt Extraction with IOL  . TOTAL KNEE ARTHROPLASTY Left 10/25/2016   Procedure: TOTAL KNEE ARTHROPLASTY;  Surgeon: Deeann Saint, MD;  Location: [facility] ORS;  Service: Orthopedics;  Laterality: Left;    Current Meds  Medication Sig  . acetaminophen (TYLENOL) 500 MG tablet Take 500 mg by mouth every 6 (six) hours as needed.  Marland Kitchen acetaminophen-codeine (TYLENOL #4) 300-60 MG tablet Take 1 tablet by mouth every 6 (six) hours as needed for moderate pain.  Marland Kitchen  allopurinol (ZYLOPRIM) 100 MG tablet Take 1 tablet (100 mg total) by mouth daily.  Marland Kitchen amLODipine (NORVASC) 10 MG tablet Take 1 tablet (10 mg total) by mouth daily.  . carvedilol (COREG) 6.25 MG tablet Take 1 tablet (6.25 mg total) by mouth 2 (two)  times daily with a meal.  . fluticasone (FLONASE) 50 MCG/ACT nasal spray Place 2 sprays into both nostrils daily.  . furosemide (LASIX) 40 MG tablet Take 1 tablet (40 mg total) by mouth 2 (two) times daily. Reported on 06/28/2015  . isosorbide mononitrate (IMDUR) 60 MG 24 hr tablet Take 1 tablet (60 mg total) by mouth daily.  Marland Kitchen losartan (COZAAR) 50 MG tablet Take 1 tablet (50 mg total) by mouth daily.  . nitroGLYCERIN (NITROSTAT) 0.4 MG SL tablet Place 1 tablet (0.4 mg total) under the tongue every 5 (five) minutes as needed for chest pain (chest pain). Maximum of 3 doses.  Marland Kitchen omeprazole (PRILOSEC) 20 MG capsule Take 1 capsule (20 mg total) by mouth daily.  . [DISCONTINUED] atorvastatin (LIPITOR) 40 MG tablet Take 1 tablet (40 mg total) by mouth daily.  . [DISCONTINUED] nitroGLYCERIN (NITROSTAT) 0.4 MG SL tablet Place 1 tablet (0.4 mg total) under the tongue every 5 (five) minutes as needed for chest pain (chest pain).   Current Facility-Administered Medications for the 11/21/17 encounter (Office Visit) with Cornelio Parkerson, Cristal Deer, MD  Medication  . triamcinolone acetonide (KENALOG) 10 MG/ML injection 10 mg  . triamcinolone acetonide (KENALOG) 10 MG/ML injection 10 mg    Allergies: Aspirin and Lisinopril  Social History   Tobacco Use  . Smoking status: Never Smoker  . Smokeless tobacco: Never Used  Substance Use Topics  . Alcohol use: No  . Drug use: No    Family History  Problem Relation Age of Onset  . Cancer Mother        stomach  . Heart disease Neg Hx     Review of Systems: A 12-system review of systems was performed and was negative except as noted in the HPI.  --------------------------------------------------------------------------------------------------  Physical Exam: BP 138/80 (BP Location: Right Arm, Patient Position: Sitting, Cuff Size: Normal)   Pulse 63   Wt 188 lb 12 oz (85.6 kg)   BMI 30.47 kg/m   General: NAD.  Accompanied by his son and a Spanish  interpreter. HEENT: No conjunctival pallor or scleral icterus. Moist mucous membranes. OP clear. Neck: Supple without lymphadenopathy, thyromegaly, JVD, or HJR. No carotid bruit. Lungs: Normal work of breathing. Clear to auscultation bilaterally without wheezes or crackles. Heart: Regular rate and rhythm without murmurs, rubs, or gallops. Non-displaced PMI. Abd: Bowel sounds present. Soft, NT/ND without hepatosplenomegaly Ext: Trace lower extremity edema with varicose veins noted. Radial, PT, and DP pulses are 2+ bilaterally Skin: Warm and dry without rash. Neuro: CNIII-XII intact. Strength and fine-touch sensation intact in upper and lower extremities bilaterally. Psych: Normal mood and affect.  EKG: Normal sinus rhythm with inferior Q waves and nonspecific T wave abnormality.  Lab Results  Component Value Date   WBC 4.5 03/19/2017   HGB 13.8 03/19/2017   HCT 41.4 03/19/2017   MCV 87 03/19/2017   PLT 287 03/19/2017    Lab Results  Component Value Date   NA 140 03/19/2017   K 3.9 03/19/2017   CL 100 03/19/2017   CO2 23 03/19/2017   BUN 21 03/19/2017   CREATININE 1.32 (H) 03/19/2017   GLUCOSE 127 (H) 03/19/2017   ALT 25 03/19/2017    Lab Results  Component Value  Date   CHOL 94 (L) 03/19/2017   HDL 32 (L) 03/19/2017   LDLCALC 32 03/19/2017   TRIG 149 03/19/2017   CHOLHDL 2.8 07/21/2016     --------------------------------------------------------------------------------------------------  ASSESSMENT AND PLAN: Stable angina No significant chest pain, with symptoms well controlled on current antianginal therapy consisting of isosorbide mononitrate, carvedilol, and amlodipine.  Patient is not on aspirin cough with this.  The most recent stress test was reassuring, he has had some perfusion abnormalities in the past and also demonstrates evidence of prior inferior MI on EKG.  I have therefore recommended starting clopidogrel 75 mg daily.  I will check a CBC today to establish  baseline hemoglobin and platelets.  Leg edema Chronic.  Given suspected CAD, we will obtain an echo to assess LVEF and rule out other structural abnormalities.  I suspect venous insufficiency is a contributing factor as well.  Left leg pain and numbness Other than trace bilateral leg edema, exam is unrevealing.  However, given pain with ambulation, we have agreed to obtain ABI's.  Hypertension BP upper normal.  Continue current medications.  Hyperlipidemia Patient previously on atorvastatin, though he does not appear to be taking this anymore.  I will check a CMP and fasting lipid panel today.  Goal LDL < 70.  Follow-up: Return to clinic in 3 months.  Yvonne Kendall, MD 11/21/2017 7:48 PM

## 2017-11-21 NOTE — Patient Instructions (Signed)
Medication Instructions:  Your physician has recommended you make the following change in your medication:  1- Remain off atorvastatin. 2- START Plavix 75 mg (1 tablet) by mouth once a day.   Labwork: Your physician recommends that you return for lab work in: TODAY - CBC, CMET, LIPID.   Testing/Procedures: Your physician has requested that you have an echocardiogram. Echocardiography is a painless test that uses sound waves to create images of your heart. It provides your doctor with information about the size and shape of your heart and how well your heart's chambers and valves are working. This procedure takes approximately one hour. There are no restrictions for this procedure. You may get an IV, if needed, to receive an ultrasound enhancing agent through to better visualize your heart.    Your physician has requested that you have an ankle brachial index (ABI). During this test an ultrasound and blood pressure cuff are used to evaluate the arteries that supply the arms and legs with blood. Allow thirty minutes for this exam. There are no restrictions or special instructions.  Your physician has requested that you have a lower extremity arterial doppler- During this test, ultrasound is used to evaluate arterial blood flow in the legs. Allow approximately one hour for this exam.      Follow-Up: Your physician recommends that you schedule a follow-up appointment in: 3 MONTHS WITH DR END OR APP.  If you need a refill on your cardiac medications before your next appointment, please call your pharmacy.    Ecocardiograma (Echocardiogram) El ecocardiograma utiliza ondas sonoras (ultrasonido) para obtener una imagen del corazn. El ecocardiograma es simple e indoloro, se obtiene en un perodo de tiempo breve y proporciona informacin valiosa a su mdico. Las imgenes de un ecocardiograma pueden proporcionar el siguiente tipo de informacin:  Evidencia de enfermedad arterial coronaria  (EAC).  Tamao del corazn.  Funcin del msculo cardaco.  Funcin de la vlvula cardaca.  Deteccin de aneurisma.  Evidencia de un infarto de miocardio pasado.  Acumulacin de lquido alrededor del corazn.  Engrosamiento del msculo cardaco.  Evaluacin de la funcin de la vlvula cardaca. INFORME A SU MDICO:  Cualquier alergia que tenga.  Todos los [employer], incluidos vitaminas, hierbas, gotas oftlmicas, cremas y [address] de [address].  Problemas previos que usted o los [employer] de su familia hayan tenido con el uso de anestsicos.  Enfermedades de la sangre que tenga.  Cirugas previas.  Enfermedades que tenga.  Posibilidad de embarazo, si corresponde.  ANTES DEL PROCEDIMIENTO No se requiere una preparacin especial. Coma y beba con normalidad. PROCEDIMIENTO  Para lograr una imagen del corazn, se aplicar un gel en el pecho y luego se pasar un instrumento similar a una vara (transductor) sobre este. El gel ayudar a transmitir las [employer] del transductor. Las ondas sonoras rebotarn inofensivamente en el corazn para permitir capturar las imgenes del corazn en movimiento, en tiempo real. Luego, las imgenes se Agricultural engineer.  Quiz necesite una va IV para recibir un medicamento que mejore la calidad de las imgenes.  DESPUS DEL PROCEDIMIENTO Puede retomar su rutina normal, incluidos la dieta, las actividades y [employer], a menos que su mdico le indique lo contrario. Esta informacin no tiene Theme park manager el consejo del mdico. Asegrese de hacerle al mdico cualquier pregunta que tenga. Document Released: 02/27/2005 Document Revised: 03/20/2014 Document Reviewed: 11/04/2012 Elsevier Interactive Patient Education  2017 Elsevier Inc.     Prueba del ndice tobillo-brazo (Ankle-Brachial Index Test) Isabelle Course  del ndice tobillo-brazo (ITB) sirve para detectar enfermedades vasculares perifricas (EVP). Las  enfermedades vasculares perifricas tambin se conocen como enfermedades arteriales perifricas. La enfermedad vascular perifrica es la obstruccin o el endurecimiento de las arterias en cualquier parte del sistema circulatorio, ms all del corazn. La causa son los depsitos de colesterol en los vasos sanguneos (ateroesclerosis) que hacen que las arterias se Armed forces training and education officer. Como consecuencia, hay un menor aporte de oxgeno a los tejidos. Esto puede causar dolor muscular y fatiga, lo que se denomina claudicacin. La enfermedad vascular perifrica indica que tambin puede haber una acumulacin de colesterol en los siguientes rganos:  El corazn. Esto aumenta el riesgo de infarto de miocardio.  El cerebro. Esto aumento el riesgo de ictus. La prueba del ndice tobillo-brazo mide la circulacin sangunea en los brazos y las piernas. Tambin permite determinar si los depsitos de colesterol estrechan los vasos sanguneos de las piernas. Hay otras causas para la disminucin del ndice tobillo-brazo, por ejemplo, la inflamacin de los vasos o un cogulo en los vasos. Sin embargo, estas son mucho menos frecuentes que el estrechamiento debido a los depsitos de [place]. CMO SE HACE LA PRUEBA DEL NDICE TOBILLO-BRAZO? La prueba se hace cuando el paciente est acostado y en reposo. Se toma la presin sistlica:  Del brazo (braquial).  Del tobillo en varios lugares a lo largo de la pierna. La presin sistlica es la presin dentro de las arterias cuando el corazn bombea sangre y se toma varias veces en ambos lados del cuerpo. La presin sistlica ms alta del tobillo se divide luego por la presin sistlica braquial ms alta. El resultado es el cociente entre la presin del tobillo y del brazo. A veces, esta prueba se repite despus de que ha hecho actividad fsica en una cinta caminadora durante cinco minutos. Si tiene enfermedad arterial perifrica, puede sentir dolor en la pierna durante la actividad  fsica. Si el valor del ndice disminuye despus del ejercicio, esto puede indicar la presencia de una enfermedad arterial perifrica. El cociente del ndice tobillo-brazo normal es entre 0,9 y1,4. Se considera que el valor por debajo de 0,9 es anormal. Esta informacin no tiene Theme park manager el consejo del mdico. Asegrese de hacerle al mdico cualquier pregunta que tenga. Document Released: 06/24/2012 Document Revised: 03/20/2014 Document Reviewed: 10/03/2013 Elsevier Interactive Patient Education  [employer].

## 2017-11-22 ENCOUNTER — Telehealth: Payer: Self-pay | Admitting: *Deleted

## 2017-11-22 ENCOUNTER — Other Ambulatory Visit: Payer: Self-pay | Admitting: Internal Medicine

## 2017-11-22 DIAGNOSIS — R600 Localized edema: Secondary | ICD-10-CM

## 2017-11-22 DIAGNOSIS — Z79899 Other long term (current) drug therapy: Secondary | ICD-10-CM

## 2017-11-22 DIAGNOSIS — E785 Hyperlipidemia, unspecified: Secondary | ICD-10-CM

## 2017-11-22 DIAGNOSIS — I209 Angina pectoris, unspecified: Secondary | ICD-10-CM

## 2017-11-22 DIAGNOSIS — I25118 Atherosclerotic heart disease of native coronary artery with other forms of angina pectoris: Secondary | ICD-10-CM

## 2017-11-22 LAB — LIPID PANEL
Chol/HDL Ratio: 4.3 ratio (ref 0.0–5.0)
Cholesterol, Total: 150 mg/dL (ref 100–199)
HDL: 35 mg/dL — ABNORMAL LOW (ref >39)
LDL Calculated: 81 mg/dL (ref 0–99)
Triglycerides: 169 mg/dL — ABNORMAL HIGH (ref 0–149)
VLDL Cholesterol Cal: 34 mg/dL (ref 5–40)

## 2017-11-22 LAB — COMPREHENSIVE METABOLIC PANEL
ALT: 19 IU/L (ref 0–44)
AST: 22 IU/L (ref 0–40)
Albumin/Globulin Ratio: 1.8 (ref 1.2–2.2)
Albumin: 4.6 g/dL (ref 3.5–4.8)
Alkaline Phosphatase: 70 IU/L (ref 39–117)
BUN/Creatinine Ratio: 13 (ref 10–24)
BUN: 19 mg/dL (ref 8–27)
Bilirubin Total: 1.2 mg/dL (ref 0.0–1.2)
CO2: 22 mmol/L (ref 20–29)
Calcium: 9.8 mg/dL (ref 8.6–10.2)
Chloride: 101 mmol/L (ref 96–106)
Creatinine, Ser: 1.41 mg/dL — ABNORMAL HIGH (ref 0.76–1.27)
GFR calc Af Amer: 54 mL/min/{1.73_m2} — ABNORMAL LOW (ref >59)
GFR calc non Af Amer: 47 mL/min/{1.73_m2} — ABNORMAL LOW (ref >59)
Globulin, Total: 2.5 g/dL (ref 1.5–4.5)
Glucose: 94 mg/dL (ref 65–99)
Potassium: 4.2 mmol/L (ref 3.5–5.2)
Sodium: 137 mmol/L (ref 134–144)
Total Protein: 7.1 g/dL (ref 6.0–8.5)

## 2017-11-22 LAB — CBC
Hematocrit: 42.7 % (ref 37.5–51.0)
Hemoglobin: 14.5 g/dL (ref 13.0–17.7)
MCH: 31.1 pg (ref 26.6–33.0)
MCHC: 34 g/dL (ref 31.5–35.7)
MCV: 92 fL (ref 79–97)
Platelets: 261 10*3/uL (ref 150–450)
RBC: 4.66 x10E6/uL (ref 4.14–5.80)
RDW: 13.2 % (ref 12.3–15.4)
WBC: 4.4 10*3/uL (ref 3.4–10.8)

## 2017-11-22 NOTE — Telephone Encounter (Signed)
Called patient using [employer] ID number [id number]. No answer with patient. Interpreter left message for patient to call back for his results.

## 2017-11-22 NOTE — Telephone Encounter (Signed)
Patient calling to discuss recent lab testing results  ° °Please call  ° °

## 2017-11-22 NOTE — Telephone Encounter (Signed)
-----   Message from Yvonne Kendall, MD sent at 11/22/2017  7:19 AM EDT ----- Please let the patient know that his renal function is moderately impaired but stable.  Cholesterol is relatively well controlled, though given concern for CAD with stable angina, his LDL is slightly above goal (goal less than 70).  CBC is normal.  I recommend starting atorvastatin 20 mg daily with repeat fasting lipid panel and ALT in approximately 3 months.

## 2017-11-26 ENCOUNTER — Telehealth: Payer: Self-pay

## 2017-11-26 MED ORDER — ATORVASTATIN CALCIUM 20 MG PO TABS: 20.0000 mg | ORAL_TABLET | Freq: Every day | ORAL | 3 refills | Status: DC

## 2017-11-26 NOTE — Telephone Encounter (Signed)
Attempted to reach the patient using [employer] ID #- [id number].  No answer- left a message for the patient to please call the office back for results.

## 2017-11-26 NOTE — Telephone Encounter (Signed)
No - please given him 90

## 2017-11-26 NOTE — Telephone Encounter (Signed)
I spoke with the patient's son, Lyn Hollingshead (ok per [facility]), regarding results the patient's most recent lab results.   He voices understanding of Dr. Serita Kyle recommendations for the patient to start atorvastatin 20 mg once daily and repeat a lipid & liver in 3 months. RX will be sent to [facility] on [address] I have advised Lyn Hollingshead if he relay to the patient to please come fasting to his next office visit with Dr. Okey Dupre in December, then we can draw a lipid/ liver the same day.  Alexander voices understanding and is agreeable.

## 2017-11-26 NOTE — Telephone Encounter (Signed)
Received prior authorization for Colchicine 0.6mg  tablet.  Patient has medicaid. When looking at medication the quantity is #180 and patient takes 1 tablet by mouth daily.   #180 qd is a 6 month supply filled all at one time.    The quantity could be why medicaid is denying the medication at the pharmacy. Is this quantity correct and do you want me to proceed with the prior auth or was this supposed to be #90?  Osvaldo Angst authorized medication but is out of the office. Routing to PCP.

## 2017-11-27 NOTE — Telephone Encounter (Signed)
Quanity changed to #90. Still needs PA, but pharmacist agreed that it wouldn't approve #180 at a time.  Will complete PA.

## 2017-12-05 ENCOUNTER — Ambulatory Visit (INDEPENDENT_AMBULATORY_CARE_PROVIDER_SITE_OTHER): Payer: Medicaid Other

## 2017-12-05 ENCOUNTER — Other Ambulatory Visit: Payer: Self-pay

## 2017-12-05 DIAGNOSIS — R600 Localized edema: Secondary | ICD-10-CM | POA: Diagnosis not present

## 2017-12-05 DIAGNOSIS — M79605 Pain in left leg: Secondary | ICD-10-CM

## 2017-12-05 DIAGNOSIS — R200 Anesthesia of skin: Secondary | ICD-10-CM | POA: Diagnosis not present

## 2017-12-05 DIAGNOSIS — I209 Angina pectoris, unspecified: Secondary | ICD-10-CM

## 2017-12-05 DIAGNOSIS — I25118 Atherosclerotic heart disease of native coronary artery with other forms of angina pectoris: Secondary | ICD-10-CM | POA: Diagnosis not present

## 2017-12-06 ENCOUNTER — Ambulatory Visit: Payer: Medicaid Other | Admitting: Physician Assistant

## 2017-12-06 ENCOUNTER — Encounter: Payer: Self-pay | Admitting: Physician Assistant

## 2017-12-06 VITALS — BP 150/92 | HR 73 | Temp 98.5°F | Wt 188.8 lb

## 2017-12-06 DIAGNOSIS — Z23 Encounter for immunization: Secondary | ICD-10-CM

## 2017-12-06 DIAGNOSIS — R0781 Pleurodynia: Secondary | ICD-10-CM

## 2017-12-06 DIAGNOSIS — K5900 Constipation, unspecified: Secondary | ICD-10-CM

## 2017-12-06 MED ORDER — POLYETHYLENE GLYCOL 3350 17 GM/SCOOP PO POWD: 17.0000 g | Freq: Every day | ORAL | 0 refills | Status: AC

## 2017-12-06 NOTE — Progress Notes (Signed)
Subjective:    Patient ID: Gerald Hodges, male    DOB: [birth year], 82 y.o.   MRN: [id number]  Gerald Hodges is a 82 y.o. male presenting on 12/06/2017 for GI Problem (pt states he has been having a bloating feeling for a month. States he can only eat a little bit of food and feel really full)   HPI   Pt with history of CAD, stable angina, constipation and GERD presents today with bloating and fullness x 1 month. He notices that he feels full easily after he eats. He only has to eat a very little to feel full. He reports that he has not been having bowel movements as frequently as normal. He reports he has a longstanding issue of constipation. He has some abdominal pain diffusely but no focal abdominal pain. There is docusate sodium listed on medication list but he reports he is not taking this. His diet consists of mostly rice, beans, and cheese. He denies blood in his stool or urinary issues. He has never been screened for colon cancer. He reports some nausea but no vomiting.   Wt Readings from Last 3 Encounters:  12/06/17 188 lb 12.8 oz (85.6 kg)  11/21/17 188 lb 12 oz (85.6 kg)  03/19/17 189 lb 6.4 oz (85.9 kg)     Social History   Tobacco Use  . Smoking status: Never Smoker  . Smokeless tobacco: Never Used  Substance Use Topics  . Alcohol use: No  . Drug use: No    Review of Systems Per HPI unless specifically indicated above     Objective:    BP (!) 150/92 (BP Location: Left Arm, Cuff Size: Normal)   Pulse 73   Temp 98.5 F (36.9 C) (Oral)   Wt 188 lb 12.8 oz (85.6 kg)   SpO2 98%   BMI 30.47 kg/m   Wt Readings from Last 3 Encounters:  12/06/17 188 lb 12.8 oz (85.6 kg)  11/21/17 188 lb 12 oz (85.6 kg)  03/19/17 189 lb 6.4 oz (85.9 kg)    Physical Exam  Constitutional: He is oriented to person, place, and time. He appears well-developed and well-nourished.  Cardiovascular: Normal rate and regular rhythm.  Pulmonary/Chest: Effort normal and  breath sounds normal.  Abdominal: Soft. Bowel sounds are normal. He exhibits no distension and no mass. There is tenderness. There is no guarding.  Some mild diffuse tenderness.   Neurological: He is alert and oriented to person, place, and time.  Skin: Skin is warm and dry.  Psychiatric: He has a normal mood and affect. His behavior is normal.   Results for orders placed or performed in visit on 11/21/17  CBC  Result Value Ref Range   WBC 4.4 3.4 - 10.8 x10E3/uL   RBC 4.66 4.14 - 5.80 x10E6/uL   Hemoglobin 14.5 13.0 - 17.7 g/dL   Hematocrit 40.9 81.1 - 51.0 %   MCV 92 79 - 97 fL   MCH 31.1 26.6 - 33.0 pg   MCHC 34.0 31.5 - 35.7 g/dL   RDW 91.4 78.2 - 95.6 %   Platelets 261 150 - 450 x10E3/uL  Comprehensive metabolic panel  Result Value Ref Range   Glucose 94 65 - 99 mg/dL   BUN 19 8 - 27 mg/dL   Creatinine, Ser 2.13 (H) 0.76 - 1.27 mg/dL   GFR calc non Af Amer 47 (L) >59 mL/min/1.73   GFR calc Af Amer 54 (L) >59 mL/min/1.73   BUN/Creatinine Ratio 13 10 -  24   Sodium 137 134 - 144 mmol/L   Potassium 4.2 3.5 - 5.2 mmol/L   Chloride 101 96 - 106 mmol/L   CO2 22 20 - 29 mmol/L   Calcium 9.8 8.6 - 10.2 mg/dL   Total Protein 7.1 6.0 - 8.5 g/dL   Albumin 4.6 3.5 - 4.8 g/dL   Globulin, Total 2.5 1.5 - 4.5 g/dL   Albumin/Globulin Ratio 1.8 1.2 - 2.2   Bilirubin Total 1.2 0.0 - 1.2 mg/dL   Alkaline Phosphatase 70 39 - 117 IU/L   AST 22 0 - 40 IU/L   ALT 19 0 - 44 IU/L  Lipid panel  Result Value Ref Range   Cholesterol, Total 150 100 - 199 mg/dL   Triglycerides 937 (H) 0 - 149 mg/dL   HDL 35 (L) >16 mg/dL   VLDL Cholesterol Cal 34 5 - 40 mg/dL   LDL Calculated 81 0 - 99 mg/dL   Chol/HDL Ratio 4.3 0.0 - 5.0 ratio      Assessment & Plan:  1. Rib pain  See #2.  2. Constipation, unspecified constipation type  Suspect his abdominal pain and bloating is coming from constipation. Suggest miralax as below. However, he has never been screened for colon cancer and given his age,  if this continues to represent a departure from his baseline I recommend he see a GI doctor for further evaluation. Also recommend increasing fiber and fluids. Will have him come back in one month for follow up and possible GI referral.   - polyethylene glycol powder (GLYCOLAX/MIRALAX) powder; Take 17 g by mouth daily.  Dispense: 1530 g; Refill: 0  3. Need for influenza vaccination  - Flu vaccine HIGH DOSE PF   Follow up plan: Return in about 1 month (around 01/05/2018) for constipation, stomach pain .  Osvaldo Angst, PA-C [facility] 12/06/2017, 10:29 AM

## 2017-12-06 NOTE — Patient Instructions (Signed)
Contenido de fibra de los alimentos  (Fiber Content in Foods)  Consulte la lista a continuación para conocer el contenido de fibra de algunos alimentos que se consumen con frecuencia.  ALIMENTOS CON ALTO CONTENIDO DE FIBRA  Los alimentos ricos en fibra contienen 4 gramos o más de fibra por porción. Estos incluyen los siguientes:  · Alcachofa (fresca): 1 mediana tiene 10,3 g de fibra.  · Frijoles cocidos, comunes o vegetarianos (en lata), ½ taza tiene 5,2 g de fibra.  · Arándanos o frambuesas (frescas): ½ taza tiene 4 g de fibra.  · Cereal de salvado: ½ taza tiene 8,6 g de fibra.  · Trigo burgol (cocido): ½ taza tiene 4 g de fibra.  · Frijoles (en lata): ½ taza tiene 6,8 g de fibra.  · Lentejas (cocidas): ½ taza tiene 7,8 g de fibra.  · Pera (fresca): 1 mediana tiene 5,1 g de fibra.  · Guisantes (congelados): ½ taza tiene 4,4 g de fibra.  · Frijoles pintos (en lata): ½ taza tiene 5,5 g de fibra.  · Frijoles pintos (secos y cocidos): ½ taza tiene 7,7 g de fibra.  · Papa con piel (al horno): 1 mediana tiene 4,4 g de fibra.  · Quinua (cocida): ½ taza tiene 5 g de fibra.  · Porotos de soja (en lata, congelados o frescos): ½ taza tiene 5,1 g de fibra.  ALIMENTOS CON CONTENIDO MODERADO DE FIBRA  Los alimentos con contenido moderado de fibra tienen de 1 a 4 gramos de fibra por porción. Estos incluyen los siguientes:  · Almendras: 1 onza tiene 3,5 g de fibra.  · Manzana con piel: 1 mediana tiene 3,3 g de fibra.  · Puré de manzana (endulzado): ½ taza tiene 1,5 g de fibra.  · Bagel solo: un bagel de 4 pulgadas (10 cm) tiene 2 g de fibra.  · Banana: 1 mediana tiene 3,1 g de fibra.  · Brócoli (cocido): ½ taza tiene 2,5 g de fibra.  · Zanahorias (cocidas): ½ taza tiene 2,3 g de fibra.  · Maíz (en lata o congelado): ½ taza tiene 2,1 g de fibra.  · Tortilla de maíz: una tortilla de 6 pulgadas (15 cm) tiene 1,5 g de fibra.  · Judías verdes (en lata): ½ taza tiene 2 g de fibra.   · Avena instantánea: ½ taza tiene aproximadamente 2 g de fibra.  · Arroz integral de grano largo (cocido): 1 taza tiene 3,5 g de fibra.  · Macarrones enriquecidos (cocidos): 1 taza tiene 2,5 g de fibra.  · Melón: 1 taza tiene 1,4 g de fibra.  · Multicereales: ½ taza tiene aproximadamente 2 a 4 g de fibra.  · Naranja: 1 pequeña tiene 3,1 g de fibra.  · Puré de papas: ½ taza tiene 1,6 g de fibra.  · Pasas: ¼ taza tiene 1,6 g de fibra.  · Calabaza: ½ taza tiene 2,9 g de fibra.  · Semillas de girasol: ¼ taza tiene 1,1 g de fibra.  · Tomate: 1 mediano tiene 1,5 g de fibra.  · Hamburguesa de vegetales o de soja: 1 tiene 3,4 g de fibra.  · Pan de salvado: 1 rebanada tiene 2 g de fibra.  · Espaguetis de salvado: ½ taza tiene 3,2 g de fibra.  ALIMENTOS CON BAJO CONTENIDO DE FIBRA  Los alimentos con bajo contenido de fibra tienen menos de 1 gramo de fibra por porción. Estos incluyen los siguientes:  · Huevo: 1 grande.  · Tortilla de harina: una tortilla de 6 pulgadas (15 cm).  · Jugo de fruta: ½ taza.  · Lechuga: 1 taza.  · Carne de res, ave o pescado: 1 onza.  · Leche: 1 taza.  · Espinaca (cruda): 1 taza.  · Pan blanco: 1 rebanada.  · Arroz blanco: ½ taza.  · Yogur: ¾ taza.  Las cantidades reales de fibra de los alimentos pueden ser diferentes en función del procesamiento. Hable con el nutricionista sobre la cantidad de fibra que necesita en la dieta.  Esta información no tiene como fin reemplazar el   consejo del médico. Asegúrese de hacerle al médico cualquier pregunta que tenga.  Document Released: 06/24/2012 Document Revised: 04/22/2015 Document Reviewed: 04/22/2015  Elsevier Interactive Patient Education © 2018 Elsevier Inc.

## 2017-12-10 ENCOUNTER — Other Ambulatory Visit: Payer: Self-pay | Admitting: Unknown Physician Specialty

## 2017-12-10 ENCOUNTER — Telehealth: Payer: Self-pay | Admitting: *Deleted

## 2017-12-10 NOTE — Telephone Encounter (Addendum)
-----   Message from Yvonne Kendall, MD sent at 12/06/2017  7:33 AM EDT ----- Please let the patient know that his echo shows that his heart is contracting normally.  There is mild leakage of the aortic valve, which we will continue to monitor.  I do not recommend further testing or medication changes at this time.  He should elevate his legs, when possible, and use OTC compression stockings for his leg edema.  We will f/u as previously discussed.   Notes recorded by Yvonne Kendall, MD on 12/07/2017 at 12:43 PM EDT Please let the patient know that his ABI's are normal without evidence of PAD.

## 2017-12-10 NOTE — Telephone Encounter (Signed)
Attempted to reach patient using [facility] Interpreter ID [phone]. No answer. Interpreter left message to call us back.

## 2017-12-12 NOTE — Telephone Encounter (Signed)
Called patient using [employer] ID [phone].  Patient not home. Son answered and ok to to discuss per DPR. Son verbalized understanding of results, plan of care and upcoming appointment date and time with no further questions.

## 2017-12-28 ENCOUNTER — Other Ambulatory Visit: Payer: Self-pay | Admitting: Nurse Practitioner

## 2017-12-28 ENCOUNTER — Telehealth: Payer: Self-pay | Admitting: Family Medicine

## 2017-12-28 ENCOUNTER — Other Ambulatory Visit: Payer: Self-pay | Admitting: Unknown Physician Specialty

## 2017-12-28 DIAGNOSIS — M109 Gout, unspecified: Secondary | ICD-10-CM

## 2017-12-28 MED ORDER — COLCHICINE 0.6 MG PO TABS: 0.6000 mg | ORAL_TABLET | Freq: Every day | ORAL | 4 refills | Status: DC

## 2017-12-28 NOTE — Progress Notes (Signed)
Patient request refill of Colchicine.  Refill request sent with thirty day supply per request.

## 2017-12-28 NOTE — Telephone Encounter (Signed)
Patient needs a refill sent to [facility] on [address] in [place] for his colchicine 0.6mg  with a 30 day supply so that his ins would pay since it is a very expensive medication.  [facility]   Patient has an upcoming appt 10/31  If any questions you can call [phone]

## 2017-12-28 NOTE — Telephone Encounter (Signed)
Refill request sent as noted.

## 2017-12-28 NOTE — Telephone Encounter (Signed)
Spoke with patients son to let him know the script was sent.

## 2017-12-28 NOTE — Telephone Encounter (Signed)
Courtesy until appointment 

## 2018-01-10 ENCOUNTER — Encounter: Payer: Self-pay | Admitting: Nurse Practitioner

## 2018-01-10 ENCOUNTER — Ambulatory Visit: Payer: Medicaid Other | Admitting: Nurse Practitioner

## 2018-01-10 DIAGNOSIS — I10 Essential (primary) hypertension: Secondary | ICD-10-CM

## 2018-01-10 DIAGNOSIS — K5901 Slow transit constipation: Secondary | ICD-10-CM | POA: Diagnosis not present

## 2018-01-10 DIAGNOSIS — K5900 Constipation, unspecified: Secondary | ICD-10-CM | POA: Insufficient documentation

## 2018-01-10 NOTE — Progress Notes (Signed)
BP (!) 148/96 (BP Location: Left Arm, Patient Position: Sitting)   Pulse (!) 58   Temp 98.4 F (36.9 C) (Oral)   Wt 185 lb 3.2 oz (84 kg)   SpO2 98%   BMI 29.89 kg/m    Subjective:    Patient ID: Gerald Hodges, male    DOB: 1935-05-06, 82 y.o.   MRN: [id number]  HPI: Gerald Hodges is a 82 y.o. male  Chief Complaint  Patient presents with  . Follow-up    1 month f/up for stomach pain   Son present at bedside and portable interpreter present in room to interpret.  CONSTIPATION: Improved with use of Miralax every other day.  Denies any further abdominal pain or bloating.  Reports daily bowel movements without straining.  No blood in stool reported or N&V.  He states "being happy" with current treatment regimen.  Discussed continued use of Miralax and provided education on medication.  Showed visuals to patient and son to assist in education.    HYPERTENSION Has not taken BP medications this morning.  Reports he starts day with Miralax and then has to take one pill at a time after his late morning coffee.  He does take medications daily. Hypertension status: stable  Satisfied with current treatment? yes Duration of hypertension: chronic BP monitoring frequency:  not checking BP range:  BP medication side effects:  no Medication compliance: excellent compliance Previous BP meds:not sure Aspirin: no Recurrent headaches: no Visual changes: no Palpitations: no Dyspnea: no Chest pain: no Lower extremity edema: no Dizzy/lightheaded: no  Relevant past medical, surgical, family and social history reviewed and updated as indicated. Interim medical history since our last visit reviewed. Allergies and medications reviewed and updated.  Review of Systems  Constitutional: Negative for activity change, appetite change, diaphoresis, fatigue, fever and unexpected weight change.  Respiratory: Negative for cough, choking, chest tightness, shortness of breath and  wheezing.   Cardiovascular: Negative for chest pain, palpitations and leg swelling.  Gastrointestinal: Negative for abdominal distention, abdominal pain, blood in stool, constipation, diarrhea, nausea and vomiting.  Endocrine: Negative for cold intolerance, heat intolerance, polydipsia, polyphagia and polyuria.  Genitourinary: Negative.   Musculoskeletal: Negative.   Skin: Negative.   Neurological: Negative for dizziness, syncope, weakness, light-headedness and headaches.  Psychiatric/Behavioral: Negative.       Per HPI unless specifically indicated above     Objective:    BP (!) 148/96 (BP Location: Left Arm, Patient Position: Sitting)   Pulse (!) 58   Temp 98.4 F (36.9 C) (Oral)   Wt 185 lb 3.2 oz (84 kg)   SpO2 98%   BMI 29.89 kg/m   Wt Readings from Last 3 Encounters:  01/10/18 185 lb 3.2 oz (84 kg)  12/06/17 188 lb 12.8 oz (85.6 kg)  11/21/17 188 lb 12 oz (85.6 kg)    Physical Exam  Constitutional: He is oriented to person, place, and time. He appears well-developed and well-nourished.  HENT:  Head: Normocephalic and atraumatic.  Eyes: Pupils are equal, round, and reactive to light. Conjunctivae and EOM are normal.  Neck: Normal range of motion. Neck supple. No JVD present. Carotid bruit is not present. No tracheal deviation present. No thyromegaly present.  Cardiovascular: Normal rate, regular rhythm, normal heart sounds and intact distal pulses.  Pulmonary/Chest: Effort normal and breath sounds normal.  Abdominal: Soft. Normal appearance and bowel sounds are normal. There is no splenomegaly or hepatomegaly.  Genitourinary: Rectum normal and penis normal.  Musculoskeletal: Normal range  of motion.  Lymphadenopathy:    He has no cervical adenopathy.  Neurological: He is alert and oriented to person, place, and time. He has normal reflexes.  Reflex Scores:      Brachioradialis reflexes are 2+ on the right side and 2+ on the left side.      Patellar reflexes are 2+ on  the right side and 2+ on the left side. Skin: Skin is warm and dry.  Psychiatric: He has a normal mood and affect. His behavior is normal.    Results for orders placed or performed in visit on 11/21/17  CBC  Result Value Ref Range   WBC 4.4 3.4 - 10.8 x10E3/uL   RBC 4.66 4.14 - 5.80 x10E6/uL   Hemoglobin 14.5 13.0 - 17.7 g/dL   Hematocrit 44.0 10.2 - 51.0 %   MCV 92 79 - 97 fL   MCH 31.1 26.6 - 33.0 pg   MCHC 34.0 31.5 - 35.7 g/dL   RDW 72.5 36.6 - 44.0 %   Platelets 261 150 - 450 x10E3/uL  Comprehensive metabolic panel  Result Value Ref Range   Glucose 94 65 - 99 mg/dL   BUN 19 8 - 27 mg/dL   Creatinine, Ser 3.47 (H) 0.76 - 1.27 mg/dL   GFR calc non Af Amer 47 (L) >59 mL/min/1.73   GFR calc Af Amer 54 (L) >59 mL/min/1.73   BUN/Creatinine Ratio 13 10 - 24   Sodium 137 134 - 144 mmol/L   Potassium 4.2 3.5 - 5.2 mmol/L   Chloride 101 96 - 106 mmol/L   CO2 22 20 - 29 mmol/L   Calcium 9.8 8.6 - 10.2 mg/dL   Total Protein 7.1 6.0 - 8.5 g/dL   Albumin 4.6 3.5 - 4.8 g/dL   Globulin, Total 2.5 1.5 - 4.5 g/dL   Albumin/Globulin Ratio 1.8 1.2 - 2.2   Bilirubin Total 1.2 0.0 - 1.2 mg/dL   Alkaline Phosphatase 70 39 - 117 IU/L   AST 22 0 - 40 IU/L   ALT 19 0 - 44 IU/L  Lipid panel  Result Value Ref Range   Cholesterol, Total 150 100 - 199 mg/dL   Triglycerides 425 (H) 0 - 149 mg/dL   HDL 35 (L) >95 mg/dL   VLDL Cholesterol Cal 34 5 - 40 mg/dL   LDL Calculated 81 0 - 99 mg/dL   Chol/HDL Ratio 4.3 0.0 - 5.0 ratio      Assessment & Plan:   Problem List Items Addressed This Visit      Cardiovascular and Mediastinum   Essential hypertension, benign    Initial BP elevated, has not taken BP meds yet today.  Recheck improved, but still above goal.  Will recheck patient in 3 months, encouraged to take BP meds prior to visit.  If continue elevation will increase Losartan dose.  Educated on taking BP readings at home.        Other   Constipation    Improved with Miralax use.  No  further abd pain and regular BM present.  Continue current regimen and encouraged use of daily probiotic.  Educated patient and son via instruction and visuals.          Follow up plan: Return in about 3 months (around 04/12/2018) for HTN (needs labs).

## 2018-01-10 NOTE — Patient Instructions (Signed)
Estreñimiento, en adultos  Constipation, Adult  Estreñimiento significa que una persona defeca en una semana menos que lo normal, tiene dificultad para defecar, o las heces son secas, duras, o más grandes que lo normal. El estreñimiento podría estar provocado por una enfermedad preexistente. Puede empeorar con la edad si una persona toma ciertos medicamentos y no toma suficiente líquido.  Siga estas indicaciones en su casa:  Qué debe comer y beber    · Consuma alimentos con alto contenido de fibra, como frutas y verduras frescas, cereales integrales y frijoles.  · Limite los alimentos ricos en grasas y con bajo contenido de fibra, o muy procesados, como las papas fritas, hamburguesas, galletas, dulces y refrescos.  · Beba suficiente líquido para mantener la orina clara o de color amarillo pálido.  Instrucciones generales  · Haga actividad física habitualmente o como se lo haya indicado el médico.  · Vaya al baño cuando sienta la necesidad de ir. No se aguante las ganas.  · Tome los medicamentos de venta libre y los recetados solamente como se lo haya indicado el médico. Estos incluyen los suplementos de fibra.  · Practique ejercicios de rehabilitación del suelo pélvico, como la respiración profunda mientras relaja la parte inferior del abdomen y la relajación del suelo pélvico mientras defeca.  · Controle su afección para detectar cualquier cambio.  · Concurra a todas las visitas de seguimiento como se lo haya indicado el médico. Esto es importante.  Comuníquese con un médico si:  · Su dolor empeora.  · Tiene fiebre.  · No defeca después de 4 días.  · Vomita.  · No tiene hambre.  · Pierde peso.  · Tiene una hemorragia en el ano.  · Las heces son delgadas como un lápiz.  Solicite ayuda de inmediato si:  · Tiene fiebre y los síntomas empeoran repentinamente.  · Observa que se filtran heces o hay sangre en las heces.  · Tiene el abdomen distendido.  · Siente un dolor intenso en el abdomen.   · Se siente mareado o se desmaya.  Esta información no tiene como fin reemplazar el consejo del médico. Asegúrese de hacerle al médico cualquier pregunta que tenga.  Document Released: 03/19/2007 Document Revised: 06/19/2016 Document Reviewed: 08/18/2015  Elsevier Interactive Patient Education © 2018 Elsevier Inc.

## 2018-01-10 NOTE — Assessment & Plan Note (Signed)
Initial BP elevated, has not taken BP meds yet today.  Recheck improved, but still above goal.  Will recheck patient in 3 months, encouraged to take BP meds prior to visit.  If continue elevation will increase Losartan dose.  Educated on taking BP readings at home.

## 2018-01-10 NOTE — Assessment & Plan Note (Signed)
Improved with Miralax use.  No further abd pain and regular BM present.  Continue current regimen and encouraged use of daily probiotic.  Educated patient and son via instruction and visuals.

## 2018-02-15 ENCOUNTER — Encounter: Payer: Self-pay | Admitting: Internal Medicine

## 2018-02-15 ENCOUNTER — Telehealth: Payer: Self-pay | Admitting: Family Medicine

## 2018-02-15 ENCOUNTER — Ambulatory Visit (INDEPENDENT_AMBULATORY_CARE_PROVIDER_SITE_OTHER): Payer: Medicaid Other | Admitting: Internal Medicine

## 2018-02-15 VITALS — BP 138/88 | HR 67 | Ht 67.0 in | Wt 187.2 lb

## 2018-02-15 DIAGNOSIS — I10 Essential (primary) hypertension: Secondary | ICD-10-CM

## 2018-02-15 DIAGNOSIS — R0609 Other forms of dyspnea: Secondary | ICD-10-CM | POA: Diagnosis not present

## 2018-02-15 DIAGNOSIS — E785 Hyperlipidemia, unspecified: Secondary | ICD-10-CM

## 2018-02-15 DIAGNOSIS — M7989 Other specified soft tissue disorders: Secondary | ICD-10-CM | POA: Diagnosis not present

## 2018-02-15 DIAGNOSIS — R0600 Dyspnea, unspecified: Secondary | ICD-10-CM | POA: Insufficient documentation

## 2018-02-15 DIAGNOSIS — H918X3 Other specified hearing loss, bilateral: Secondary | ICD-10-CM

## 2018-02-15 DIAGNOSIS — I208 Other forms of angina pectoris: Secondary | ICD-10-CM | POA: Diagnosis not present

## 2018-02-15 MED ORDER — ISOSORBIDE MONONITRATE ER 60 MG PO TB24: 60.0000 mg | ORAL_TABLET | Freq: Every day | ORAL | 2 refills | Status: DC

## 2018-02-15 NOTE — Patient Instructions (Signed)
Medication Instructions:  Your physician recommends that you continue on your current medications as directed. Please refer to the Current Medication list given to you today.  If you need a refill on your cardiac medications before your next appointment, please call your pharmacy.   Lab work: Your physician recommends that you return for lab work in: TODAY - LIPID, ALT.  If you have labs (blood work) drawn today and your tests are completely normal, you will receive your results only by: Marland Kitchen MyChart Message (if you have MyChart) OR . A paper copy in the mail If you have any lab test that is abnormal or we need to change your treatment, we will call you to review the results.  Testing/Procedures: none  Follow-Up: At [facility], you and your health needs are our priority.  As part of our continuing mission to provide you with exceptional heart care, we have created designated Provider Care Teams.  These Care Teams include your primary Cardiologist (physician) and Advanced Practice Providers (APPs -  Physician Assistants and Nurse Practitioners) who all work together to provide you with the care you need, when you need it. You will need a follow up appointment in 6 months.  Please call our office 2 months in advance to schedule this appointment.  You may see DR Cristal Deer END or one of the following Advanced Practice Providers on your designated Care Team:   Nicolasa Ducking, NP Eula Listen, PA-C . Marisue Ivan, PA-C

## 2018-02-15 NOTE — Progress Notes (Signed)
Follow-up Outpatient Visit Date: 02/15/2018  Primary Care Provider: Steele Sizer, MD [address] [place] [address]  Chief Complaint: Shortness of breath  HPI:  [name] is a 82 y.o. year-old male with history of chronic angina and coronary artery disease based on abnormal myocardial perfusion stress tests at [facility], hypertension, hyperlipidemia, GERD, TIA, and CKD stage III, who presents for follow-up of chest pain.  I last saw him in September at which time he was feeling well other than intermittent left leg pain and paresthesias.  His chest pain was well controlled on an antianginal regimen of isosorbide mononitrate, carvedilol, and amlodipine.  We agreed to proceed with an echo and ABIs to assess like pain and swelling.  ABIs were normal.  Echo showed preserved LVEF with grade 1 diastolic dysfunction and mild aortic regurgitation.  Today, the patient reports that he has continued to have exertional dyspnea that has been stable for more than a year.  She he feels somewhat more fatigued and attributes this to weight gain secondary to dietary indiscretion.  He has also been less active due to orthopedic issues.  He denies chest pain, palpitations, lightheadedness, and orthopnea.  He notes continued dependent edema.  He has been self titrating his furosemide dosing taking anywhere from 0 to 2 pills a day.  He is also no longer taking amlodipine, though it is unclear if/when this was discontinued by a provider.  --------------------------------------------------------------------------------------------------  Cardiovascular History & Procedures: Cardiovascular Problems:  Stable angina  Left leg pain and numbness  Risk Factors:  Suspected coronary artery disease, hypertension, hyperlipidemia, male gender, and age greater than 66  Cath/PCI:  None  CV Surgery:  None  EP Procedures and Devices:  None  Non-Invasive Evaluation(s):  ABIs (12/05/2017):  Borderline abnormal right toe brachial index.  Elevated right ABI.  Normal left ABI/TBI.  TTE (12/05/2017): Normal LV size.  Mild LVH.  Moderate focal basal hypertrophy.  LVEF 55 to 60% with grade 1 diastolic dysfunction.  Mild aortic valve thickening with mild regurgitation.  Normal RV size and function.  Pharmacologic MPI (10/23/2016, [facility]): Low risk study without significant ischemia.  Small in size, subtle in severity, fixed mid inferolateral and basal lateral defect consistent with possible artifact but cannot rule out subtle scar.  Normal LVEF (56%).  No significant coronary artery calcification.  Study unchanged since 10/08/2012.  Recent CV Pertinent Labs: Lab Results  Component Value Date   CHOL 150 11/21/2017   HDL 35 (L) 11/21/2017   LDLCALC 81 11/21/2017   TRIG 169 (H) 11/21/2017   CHOLHDL 4.3 11/21/2017   INR 1.03 10/16/2016   K 4.2 11/21/2017   BUN 19 11/21/2017   CREATININE 1.41 (H) 11/21/2017    Past medical and surgical history were reviewed and updated in EPIC.  Current Meds  Medication Sig  . allopurinol (ZYLOPRIM) 100 MG tablet Take 1 tablet (100 mg total) by mouth daily.  Marland Kitchen atorvastatin (LIPITOR) 20 MG tablet Take 1 tablet (20 mg total) by mouth daily.  . carvedilol (COREG) 6.25 MG tablet Take 1 tablet (6.25 mg total) by mouth 2 (two) times daily with a meal.  . clopidogrel (PLAVIX) 75 MG tablet Take 1 tablet (75 mg total) by mouth daily.  . colchicine 0.6 MG tablet Take 1 tablet (0.6 mg total) by mouth daily.  . furosemide (LASIX) 40 MG tablet Take 1 tablet (40 mg total) by mouth 2 (two) times daily. Reported on 06/28/2015  . isosorbide mononitrate (IMDUR) 60 MG  24 hr tablet Take 1 tablet (60 mg total) by mouth daily.  Marland Kitchen losartan (COZAAR) 50 MG tablet TAKE 1 TABLET BY MOUTH ONCE DAILY  . omeprazole (PRILOSEC) 20 MG capsule TAKE 1 CAPSULE BY MOUTH EVERY DAY  . rivaroxaban (XARELTO) 10 MG TABS tablet Take 1 tablet (10 mg total) by mouth daily.   Current  Facility-Administered Medications for the 02/15/18 encounter (Office Visit) with Tarri Guilfoil, Cristal Deer, MD  Medication  . triamcinolone acetonide (KENALOG) 10 MG/ML injection 10 mg  . triamcinolone acetonide (KENALOG) 10 MG/ML injection 10 mg    Allergies: Aspirin and Lisinopril  Social History   Tobacco Use  . Smoking status: Never Smoker  . Smokeless tobacco: Never Used  Substance Use Topics  . Alcohol use: No  . Drug use: No    Family History  Problem Relation Age of Onset  . Cancer Mother        stomach  . Heart disease Neg Hx     Review of Systems: A 12-system review of systems was performed and was negative except as noted in the HPI.  --------------------------------------------------------------------------------------------------  Physical Exam: BP 138/88 (BP Location: Left Arm, Patient Position: Sitting, Cuff Size: Normal)   Pulse 67   Ht 5\' 7"  (1.702 m)   Wt 187 lb 4 oz (84.9 kg)   BMI 29.33 kg/m   General: NAD.  Accompanied by his son and Spanish interpreter. HEENT: No conjunctival pallor or scleral icterus. Moist mucous membranes.  OP clear. Neck: Supple without lymphadenopathy, thyromegaly, JVD, or HJR. Lungs: Normal work of breathing. Clear to auscultation bilaterally without wheezes or crackles. Heart: Regular rate and rhythm without murmurs, rubs, or gallops. Non-displaced PMI. Abd: Bowel sounds present. Soft, NT/ND without hepatosplenomegaly Ext: Varicose veins noted in both calves, left greater than right.  No significant lower extremity edema appreciated. Skin: Warm and dry without rash.  EKG: Normal sinus rhythm with first-degree AV block and possible inferior MI.  No significant change from prior tracing on 11/21/2017.  Lab Results  Component Value Date   WBC 4.4 11/21/2017   HGB 14.5 11/21/2017   HCT 42.7 11/21/2017   MCV 92 11/21/2017   PLT 261 11/21/2017    Lab Results  Component Value Date   NA 137 11/21/2017   K 4.2 11/21/2017   CL 101  11/21/2017   CO2 22 11/21/2017   BUN 19 11/21/2017   CREATININE 1.41 (H) 11/21/2017   GLUCOSE 94 11/21/2017   ALT 19 11/21/2017    Lab Results  Component Value Date   CHOL 150 11/21/2017   HDL 35 (L) 11/21/2017   LDLCALC 81 11/21/2017   TRIG 169 (H) 11/21/2017   CHOLHDL 4.3 11/21/2017    --------------------------------------------------------------------------------------------------  ASSESSMENT AND PLAN: Stable angina No change in symptoms from prior visit.  Myocardial perfusion stress test at [facility] in 2018 was low risk.  We will continue his current medications including carvedilol and isosorbide mononitrate for antianginal therapy.  Importance of secondary prevention stressed.  Given stable symptoms and CKD, we will defer catheterization.  Dyspnea on exertion Likely multifactorial, including deconditioning, diastolic dysfunction, and element of CAD.  Overall symptoms are stable.  I think it is reasonable for him to titrate furosemide based on edema and weight.  I stressed the importance of being compliant with his other medications.  Hypertension Blood pressure upper normal.  Continue current medications.  I think it is fine to hold off on restarting amlodipine, particularly given complaints of leg edema.  Hyperlipidemia Goal LDL less  than 70.  Atorvastatin 20 mg daily was started at our last visit in September.  I will check a fasting lipid panel and ALT today to ensure that LDL is at goal.  Leg swelling Likely multifactorial.  I think there is a large component of venous insufficiency.  As needed Lasix can be continued, as well as conservative measures such as compression stockings and leg elevation.  Of note, ABIs at our last visit were unrevealing.  Follow-up: Return to clinic in 6 months.  Yvonne Kendall, MD 02/15/2018 8:24 AM

## 2018-02-15 NOTE — Telephone Encounter (Signed)
Gerald Hodges pt's son presented in office stating that er sent pt to [facility]. Pt's son would like to get a referral so that they can schedule and appointment at [facility] ent. Please advise.

## 2018-02-16 LAB — LIPID PANEL
Chol/HDL Ratio: 2.6 ratio (ref 0.0–5.0)
Cholesterol, Total: 100 mg/dL (ref 100–199)
HDL: 39 mg/dL — ABNORMAL LOW (ref >39)
LDL Calculated: 38 mg/dL (ref 0–99)
Triglycerides: 117 mg/dL (ref 0–149)
VLDL Cholesterol Cal: 23 mg/dL (ref 5–40)

## 2018-02-16 LAB — ALT: ALT: 21 IU/L (ref 0–44)

## 2018-02-18 ENCOUNTER — Other Ambulatory Visit: Payer: Self-pay | Admitting: Family Medicine

## 2018-02-18 NOTE — Telephone Encounter (Signed)
The pt is Gerald Hodges. His son's name is Trinna Post

## 2018-02-18 NOTE — Telephone Encounter (Signed)
I do not see an ER visit in the patient's chart. So what ER did the patient go to. What is the referral for?

## 2018-02-18 NOTE — Telephone Encounter (Signed)
Please check on this and forward back to Dr. Dossie Arbour

## 2018-02-18 NOTE — Telephone Encounter (Signed)
I am not sure what the patient's name is, but this seems to be linked to the wrong chart.  Let me know and I will do the ER to ENT referral.

## 2018-02-18 NOTE — Telephone Encounter (Signed)
Left message for Trinna Post to gather information requested. CRM created.

## 2018-02-19 NOTE — Telephone Encounter (Signed)
Patients son stopped by the office this morning stating patient needs referral for hearing loss in both ears.  He has already spoken with someone at [facility] and they stated he just needs a referral from Dr Dossie Arbour.   Thank you

## 2018-02-22 NOTE — Telephone Encounter (Signed)
Patient son Trinna Post called to inquire about referral for ENT for patient. He was informed that messages have been sent just waiting for Dr. Dossie Arbour to refer and someone will be in touch with him.

## 2018-02-27 ENCOUNTER — Ambulatory Visit: Payer: Medicaid Other | Admitting: Internal Medicine

## 2018-03-12 DIAGNOSIS — H524 Presbyopia: Secondary | ICD-10-CM | POA: Diagnosis not present

## 2018-03-15 DIAGNOSIS — H5213 Myopia, bilateral: Secondary | ICD-10-CM | POA: Diagnosis not present

## 2018-03-27 DIAGNOSIS — H903 Sensorineural hearing loss, bilateral: Secondary | ICD-10-CM | POA: Diagnosis not present

## 2018-03-27 DIAGNOSIS — H6123 Impacted cerumen, bilateral: Secondary | ICD-10-CM | POA: Diagnosis not present

## 2018-04-12 DIAGNOSIS — H5213 Myopia, bilateral: Secondary | ICD-10-CM | POA: Diagnosis not present

## 2018-04-18 ENCOUNTER — Ambulatory Visit: Payer: Medicaid Other | Admitting: Nurse Practitioner

## 2018-06-09 ENCOUNTER — Other Ambulatory Visit: Payer: Self-pay | Admitting: Unknown Physician Specialty

## 2018-06-09 DIAGNOSIS — M109 Gout, unspecified: Secondary | ICD-10-CM

## 2018-07-26 ENCOUNTER — Other Ambulatory Visit: Payer: Self-pay | Admitting: Unknown Physician Specialty

## 2018-07-26 NOTE — Telephone Encounter (Signed)
Requested Prescriptions  Pending Prescriptions Disp Refills  . omeprazole (PRILOSEC) 20 MG capsule [Pharmacy Med Name: OMEPRAZOLE 20MG  CAPSULES] 90 capsule 0    Sig: TAKE 1 CAPSULE BY MOUTH EVERY DAY     Gastroenterology: Proton Pump Inhibitors Passed - 07/26/2018 10:45 AM      Passed - Valid encounter within last 12 months    Recent Outpatient Visits          6 months ago Essential hypertension, benign   [facility] [place], Parrish T, NP   7 months ago Rib pain   [facility] Trey Sailors, PA-C   1 year ago Leg swelling   [facility] Gabriel Cirri, NP   1 year ago Pre-operative clearance   [facility] Gabriel Cirri, NP   2 years ago Annual physical exam   [facility] Gabriel Cirri, NP

## 2018-07-28 ENCOUNTER — Other Ambulatory Visit: Payer: Self-pay | Admitting: Unknown Physician Specialty

## 2018-07-28 DIAGNOSIS — I10 Essential (primary) hypertension: Secondary | ICD-10-CM

## 2018-07-28 NOTE — Telephone Encounter (Signed)
Pt needs office visit

## 2018-09-10 DIAGNOSIS — R52 Pain, unspecified: Secondary | ICD-10-CM | POA: Diagnosis not present

## 2018-09-10 DIAGNOSIS — Z20828 Contact with and (suspected) exposure to other viral communicable diseases: Secondary | ICD-10-CM | POA: Diagnosis not present

## 2018-09-16 NOTE — Progress Notes (Signed)
Cardiology Office Note Date:  09/17/2018  Patient ID:  Gerald, Hodges 03/21/1935, MRN [id number] PCP:  Guadalupe Maple, MD  Cardiologist:  Dr. Saunders Revel, MD    Chief Complaint: Follow up  History of Present Illness: Gerald Hodges is a 83 y.o. male with history of CAD based on abnormal myocardial perfusion stress test at [facility] with chronic angina, TIA, CKD stage III, HTN, HLD, and GERD who presents for follow up.   Prior Myoview in 10/2016 at [facility] was without significant ischemia with a small lin size, subtle in severity, fixed mid inferolateral and basal defect consistent with possible artifact but could not rule out subtle scar, EF 56%, no significant coronary artery calcification. Study was low risk and unchanged when compared to prior in 09/2012. Echo from 11/2017 showed an EF of 55-60%, mild LVH, moderate focal basal hypertrophy, Gr1DD, mild aortic valve thickening with mild regurgitation, normal RV size and function. When he was seen in 11/2017, he noted some left leg pain/paresthesias and underwent lower extremity ABIs in 11/2017 that showed borderline abnormal right toe brachial index with normal ABIs.   He was last seen in the office in 02/2018 and continued to note exertional dyspnea that had been stable for more than a year. He felt more fatigued and attributed this to weight gain secondary to dietary indiscretion. He denied chest pain and had stable dependent edema and was self-titrating his Lasix from 0-2 pills daily. He was noted to no longer be taking amlodipine with details being unclear. He was continued on antianginal therapy with deferment of cath given unchanged Myoview in 2018 when compared to 2014 and in the setting of his CKD. His dyspnea was overall stable and felt to be multifactorial.   He comes in doing reasonably well today.  History is taken with the assistance of hospital supplied medical interpreter.  Indicates he had an isolated episode of chest pain on  09/13/2018 that lasted approximately 10 minutes and resolved with a single sublingual nitroglycerin.  Symptoms were similar to his prior angina.  Since then, he has been without chest pain.  His dyspnea is stable.  No palpitations, dizziness, presyncope, or syncope.  His lower extremity swelling is unchanged and typically improved in the morning after laying supine overnight and worse if he has been up on his feet for extended time frame during the day.  He does state he has been taking Lasix 80 mg twice daily as he thought that is what he was supposed to take.  He has been out of Plavix for an uncertain amount of time.  He denies any falls, BRBPR, or melena.  Overall he feels like he is doing well.  Labs: 02/2018 - ALT normal, LDL 38 11/2017 - SCr 1.41, K+ 4.2, HGB 14.5   Past Medical History:  Diagnosis Date  . Arthritis Left knee  . Chronic kidney disease    STAGE 3  . Coronary artery disease   . GERD (gastroesophageal reflux disease)   . Gout   . History of kidney problems   . Hyperlipemia 08/18/2014  . Hyperlipidemia   . Hypertension   . TIA (transient ischemic attack)     Past Surgical History:  Procedure Laterality Date  . BUNIONECTOMY Bilateral   . EYE SURGERY Bilateral    Cataractt Extraction with IOL  . TOTAL KNEE ARTHROPLASTY Left 10/25/2016   Procedure: TOTAL KNEE ARTHROPLASTY;  Surgeon: Earnestine Leys, MD;  Location: [facility] ORS;  Service: Orthopedics;  Laterality: Left;  Current Meds  Medication Sig  . allopurinol (ZYLOPRIM) 100 MG tablet TAKE 1 TABLET BY MOUTH EVERY DAY  . amLODipine (NORVASC) 10 MG tablet TAKE 1 TABLET BY MOUTH EVERY DAY  . atorvastatin (LIPITOR) 20 MG tablet Take 1 tablet (20 mg total) by mouth daily.  . carvedilol (COREG) 6.25 MG tablet Take 1 tablet (6.25 mg total) by mouth 2 (two) times daily with a meal.  . colchicine 0.6 MG tablet Take 1 tablet (0.6 mg total) by mouth daily.  . furosemide (LASIX) 40 MG tablet Take 1 tablet (40 mg total) by mouth 2  (two) times daily. Reported on 06/28/2015  . isosorbide mononitrate (IMDUR) 60 MG 24 hr tablet Take 1.5 tablets (90 mg total) by mouth daily.  Marland Kitchen losartan (COZAAR) 50 MG tablet TAKE 1 TABLET BY MOUTH ONCE DAILY  . nitroGLYCERIN (NITROSTAT) 0.4 MG SL tablet Place 1 tablet (0.4 mg total) under the tongue every 5 (five) minutes as needed for chest pain (chest pain). Maximum of 3 doses.  Marland Kitchen omeprazole (PRILOSEC) 20 MG capsule TAKE 1 CAPSULE BY MOUTH EVERY DAY  . [DISCONTINUED] isosorbide mononitrate (IMDUR) 60 MG 24 hr tablet Take 1 tablet (60 mg total) by mouth daily.   Current Facility-Administered Medications for the 09/17/18 encounter (Office Visit) with Sondra Barges, PA-C  Medication  . triamcinolone acetonide (KENALOG) 10 MG/ML injection 10 mg  . triamcinolone acetonide (KENALOG) 10 MG/ML injection 10 mg    Allergies:   Aspirin and Lisinopril   Social History:  The patient  reports that he has never smoked. He has never used smokeless tobacco. He reports that he does not drink alcohol or use drugs.   Family History:  The patient's family history includes Cancer in his mother.  ROS:   Review of Systems  Constitutional: Positive for malaise/fatigue. Negative for chills, diaphoresis, fever and weight loss.  HENT: Negative for congestion.   Eyes: Negative for discharge and redness.  Respiratory: Negative for cough, hemoptysis, sputum production, shortness of breath and wheezing.   Cardiovascular: Positive for chest pain and leg swelling. Negative for palpitations, orthopnea, claudication and PND.  Gastrointestinal: Negative for abdominal pain, blood in stool, heartburn, melena, nausea and vomiting.  Genitourinary: Negative for hematuria.  Musculoskeletal: Negative for falls and myalgias.  Skin: Negative for rash.  Neurological: Negative for dizziness, tingling, tremors, sensory change, speech change, focal weakness, loss of consciousness and weakness.  Endo/Heme/Allergies: Does not  bruise/bleed easily.  Psychiatric/Behavioral: Negative for substance abuse. The patient is not nervous/anxious.   All other systems reviewed and are negative.    PHYSICAL EXAM:  VS:  BP 130/90 (BP Location: Left Arm, Patient Position: Sitting, Cuff Size: Normal)   Pulse 70   Temp 97.6 F (36.4 C)   Ht 5\' 7"  (1.702 m)   Wt 197 lb (89.4 kg)   SpO2 97%   BMI 30.85 kg/m  BMI: Body mass index is 30.85 kg/m.  Physical Exam  Constitutional: He is oriented to person, place, and time. He appears well-developed and well-nourished.  HENT:  Head: Normocephalic and atraumatic.  Eyes: Right eye exhibits no discharge. Left eye exhibits no discharge.  Neck: Normal range of motion. No JVD present.  Cardiovascular: Normal rate, regular rhythm, S1 normal, S2 normal and normal heart sounds. Exam reveals no distant heart sounds, no friction rub, no midsystolic click and no opening snap.  No murmur heard. Pulses:      Posterior tibial pulses are 2+ on the right side and 1+ on the  left side.  Pulmonary/Chest: Effort normal and breath sounds normal. No respiratory distress. He has no decreased breath sounds. He has no wheezes. He has no rales. He exhibits no tenderness.  Abdominal: Soft. He exhibits no distension. There is no abdominal tenderness.  Musculoskeletal:        General: Edema present.     Comments: Trace bilateral pretibial edema  Neurological: He is alert and oriented to person, place, and time.  Skin: Skin is warm and dry. No cyanosis. Nails show no clubbing.  Psychiatric: He has a normal mood and affect. His speech is normal and behavior is normal. Judgment and thought content normal.     EKG:  Was ordered and interpreted by me today. Shows NSR, 70 bpm, first-degree AV block, possible prior inferior infarct, nonspecific inferolateral ST-T changes (grossly unchanged from prior)  Recent Labs: 11/21/2017: BUN 19; Creatinine, Ser 1.41; Hemoglobin 14.5; Platelets 261; Potassium 4.2; Sodium  137 02/15/2018: ALT 21  02/15/2018: Chol/HDL Ratio 2.6; Cholesterol, Total 100; HDL 39; LDL Calculated 38; Triglycerides 117   CrCl cannot be calculated (Patient's most recent lab result is older than the maximum 21 days allowed.).   Wt Readings from Last 3 Encounters:  09/17/18 197 lb (89.4 kg)  02/15/18 187 lb 4 oz (84.9 kg)  01/10/18 185 lb 3.2 oz (84 kg)     Other studies reviewed: Additional studies/records reviewed today include: summarized above  ASSESSMENT AND PLAN:  1. Stable angina: Symptoms are overall relatively stable.  She did have an episode of chest pain that lasted approximately 10 minutes and responded to a single sublingual nitroglycerin on 09/13/2018.  This felt similar to his prior episodes.  [facility] was low risk and essentially unchanged from study performed in 2014.  Given stable symptoms and underlying CKD cardiac catheterization continues to be deferred at this time.  Titrate Imdur to 90 mg daily.  Otherwise, he will continue amlodipine, carvedilol, and Lipitor as outlined below.  Aggressive risk factor modification.  2. Dyspnea on exertion: Stable and likely multifactorial including obesity, physical deconditioning, diastolic dysfunction, and component of CAD.  Await follow-up BMP as outlined below to further prescribe Lasix.  Compliance seems to be a big issue for him.  3. Hypertension: Blood pressure is upper limits of normal today.  Patient initially stated he was not taking amlodipine though after reviewing his supplied pill bottles it was discovered he actually is taking this medication.  We will continue him on current doses of amlodipine, carvedilol, and losartan.  Increase Imdur to 90 mg daily as above.  May refill Lasix pending follow-up BMP at a lower dose after labs result.  4. Hyperlipidemia: LDL of 38 from 02/2018.  Continue Lipitor 20 mg daily.  5. Lower extremity swelling: Stable.  History is strongly consistent with venous  insufficiency.  Recommend leg elevation and compression stocking.  Recommend conservative use of Lasix.  6. CKD stage III: Patient indicates he has been taking Lasix 40 mg 2 tab twice daily for the past several weeks as he thought that is what he was supposed to take.  In the setting, he has run out of medication.  I have advised the patient this is likely too strong of a dose for him and will defer refilling this at this time pending his follow-up BMP, which was drawn today.  7. Medication reconciliation: After extensive discussion with patient and medical interpreter it was discovered the patient actually is taking amlodipine after reviewing his pill  bottles.  This will be continued as outlined above.  I also gave the patient was previously prescribed Xarelto 10 mg daily as a prophylactic dose in the setting of orthopedic surgery which she is no longer taking.  This will be removed from his medications.  8. Language barrier: [facility] supplied Medical interpreter used for encounter.   Disposition: F/u with Dr. Okey DupreEnd or an APP in 6 months.  Current medicines are reviewed at length with the patient today.  The patient did not have any concerns regarding medicines.  Signed, Eula Listenyan Nuria Phebus, PA-C 09/17/2018 4:34 PM     [facility] [address] [place], [place] [address] [phone]

## 2018-09-17 ENCOUNTER — Encounter: Payer: Self-pay | Admitting: Physician Assistant

## 2018-09-17 ENCOUNTER — Other Ambulatory Visit: Payer: Self-pay

## 2018-09-17 ENCOUNTER — Other Ambulatory Visit
Admission: RE | Admit: 2018-09-17 | Discharge: 2018-09-17 | Disposition: A | Discharge status: Home / Self Care | Payer: Medicaid Other | Source: Ambulatory Visit | Attending: Physician Assistant | Admitting: Physician Assistant

## 2018-09-17 ENCOUNTER — Ambulatory Visit (INDEPENDENT_AMBULATORY_CARE_PROVIDER_SITE_OTHER): Payer: Medicaid Other | Admitting: Physician Assistant

## 2018-09-17 VITALS — BP 130/90 | HR 70 | Temp 97.6°F | Ht 67.0 in | Wt 197.0 lb

## 2018-09-17 DIAGNOSIS — N183 Chronic kidney disease, stage 3 unspecified: Secondary | ICD-10-CM

## 2018-09-17 DIAGNOSIS — Z79899 Other long term (current) drug therapy: Secondary | ICD-10-CM | POA: Diagnosis not present

## 2018-09-17 DIAGNOSIS — M7989 Other specified soft tissue disorders: Secondary | ICD-10-CM | POA: Diagnosis not present

## 2018-09-17 DIAGNOSIS — I208 Other forms of angina pectoris: Secondary | ICD-10-CM | POA: Diagnosis not present

## 2018-09-17 DIAGNOSIS — R0609 Other forms of dyspnea: Secondary | ICD-10-CM

## 2018-09-17 DIAGNOSIS — E785 Hyperlipidemia, unspecified: Secondary | ICD-10-CM

## 2018-09-17 DIAGNOSIS — I10 Essential (primary) hypertension: Secondary | ICD-10-CM | POA: Diagnosis not present

## 2018-09-17 LAB — HEPATIC FUNCTION PANEL
ALT: 24 U/L (ref 0–44)
AST: 23 U/L (ref 15–41)
Albumin: 4.4 g/dL (ref 3.5–5.0)
Alkaline Phosphatase: 78 U/L (ref 38–126)
Bilirubin, Direct: 0.2 mg/dL (ref 0.0–0.2)
Indirect Bilirubin: 0.7 mg/dL (ref 0.3–0.9)
Total Bilirubin: 0.9 mg/dL (ref 0.3–1.2)
Total Protein: 7.2 g/dL (ref 6.5–8.1)

## 2018-09-17 LAB — BASIC METABOLIC PANEL
Anion gap: 9 (ref 5–15)
BUN: 25 mg/dL — ABNORMAL HIGH (ref 8–23)
CO2: 25 mmol/L (ref 22–32)
Calcium: 9.5 mg/dL (ref 8.9–10.3)
Chloride: 103 mmol/L (ref 98–111)
Creatinine, Ser: 1.48 mg/dL — ABNORMAL HIGH (ref 0.61–1.24)
GFR calc Af Amer: 51 mL/min — ABNORMAL LOW (ref >60)
GFR calc non Af Amer: 44 mL/min — ABNORMAL LOW (ref >60)
Glucose, Bld: 123 mg/dL — ABNORMAL HIGH (ref 70–99)
Potassium: 4.5 mmol/L (ref 3.5–5.1)
Sodium: 137 mmol/L (ref 135–145)

## 2018-09-17 MED ORDER — ISOSORBIDE MONONITRATE ER 60 MG PO TB24: 90.0000 mg | ORAL_TABLET | Freq: Every day | ORAL | 2 refills | Status: DC

## 2018-09-17 MED ORDER — CLOPIDOGREL BISULFATE 75 MG PO TABS: 75.0000 mg | ORAL_TABLET | Freq: Every day | ORAL | 3 refills | Status: DC

## 2018-09-17 NOTE — Patient Instructions (Signed)
Medication Instructions:  Your physician has recommended you make the following change in your medication:  1- STOP Xarelto 2- Restart Plavix 1 tablet (75 mg total) once daily  3- INCREASE Imdur 1.5 tablets (90 mg total) once daily  If you need a refill on your cardiac medications before your next appointment, please call your pharmacy.   Lab work: Your physician recommends that you have lab work today(BMET)   If you have labs (blood work) drawn today and your tests are completely normal, you will receive your results only by: Marland Kitchen MyChart Message (if you have MyChart) OR . A paper copy in the mail If you have any lab test that is abnormal or we need to change your treatment, we will call you to review the results.  Testing/Procedures: None ordered   Follow-Up: At [facility], you and your health needs are our priority.  As part of our continuing mission to provide you with exceptional heart care, we have created designated Provider Care Teams.  These Care Teams include your primary Cardiologist (physician) and Advanced Practice Providers (APPs -  Physician Assistants and Nurse Practitioners) who all work together to provide you with the care you need, when you need it. You will need a follow up appointment in 6 months.  Please call our office 2 months in advance to schedule this appointment.  You may see Dr. Saunders Revel or Christell Faith, PA-C.

## 2018-09-18 ENCOUNTER — Telehealth: Payer: Self-pay

## 2018-09-18 MED ORDER — FUROSEMIDE 40 MG PO TABS: ORAL_TABLET | ORAL | 3 refills | Status: DC

## 2018-09-18 NOTE — Telephone Encounter (Signed)
-----   Message from Ryan M Dunn, PA-C sent at 09/17/2018  5:44 PM EDT ----- Liver function normal. Renal function remains elevated though is overall stable. Potassium at goal.  Okay to refill Lasix 40 mg 1-2 tabs daily as needed for weight gain, shortness of breath, lower extremity swelling.  I will not place the patient on KCl repletion given his underlying CKD and with him being on losartan. 

## 2018-09-18 NOTE — Telephone Encounter (Signed)
Returned the pt call. Spoke with the pt son Gerald Hodges. [name] made aware of the results and Dr.End's recommendation. [name] verbalized understanding and voiced appreciation for the call

## 2018-09-18 NOTE — Telephone Encounter (Signed)
-----   Message from Rise Mu, PA-C sent at 09/17/2018  5:44 PM EDT ----- Liver function normal. Renal function remains elevated though is overall stable. Potassium at goal.  Okay to refill Lasix 40 mg 1-2 tabs daily as needed for weight gain, shortness of breath, lower extremity swelling.  I will not place the patient on KCl repletion given his underlying CKD and with him being on losartan.

## 2018-09-18 NOTE — Telephone Encounter (Signed)
Called to give the pt lab results and Christell Faith, PA recommendation. [email or web address].

## 2018-09-18 NOTE — Telephone Encounter (Signed)
Patient is returning the call  

## 2018-10-03 ENCOUNTER — Other Ambulatory Visit: Payer: Self-pay | Admitting: Unknown Physician Specialty

## 2018-10-03 MED ORDER — PANTOPRAZOLE SODIUM 40 MG PO TBEC: 40.0000 mg | DELAYED_RELEASE_TABLET | Freq: Every day | ORAL | 3 refills | Status: DC

## 2018-10-03 NOTE — Telephone Encounter (Signed)
Requested medications are due for refill today?  Yes  Requested medications are on the active medication list?  Yes  Last refill 07/26/2018  Future visit scheduled?  No  Notes to clinic: Via Spanish interpreter ID # [id number] left patient a voicemail to return call to schedule follow up appointment.  Patient is greater than 3 months over due for follow up with PCP.  No refill authorized.   Requested Prescriptions  Pending Prescriptions Disp Refills   omeprazole (PRILOSEC) 20 MG capsule [Pharmacy Med Name: OMEPRAZOLE 20MG  CAPSULES] 90 capsule 0    Sig: TAKE 1 CAPSULE BY MOUTH EVERY DAY     Gastroenterology: Proton Pump Inhibitors Passed - 10/03/2018 10:29 AM      Passed - Valid encounter within last 12 months    Recent Outpatient Visits          8 months ago Essential hypertension, benign   [place], [place] T, NP   10 months ago Rib pain   [place] Trinna Post, PA-C   1 year ago Leg swelling   [facility] Kathrine Haddock, NP   2 years ago Pre-operative clearance   [facility] Kathrine Hadd[facility], [facility]   2 years ago Annual physical exam   [facility] Kathrine Haddock, NP

## 2018-10-04 ENCOUNTER — Encounter: Payer: Self-pay | Admitting: Family Medicine

## 2018-10-04 ENCOUNTER — Ambulatory Visit: Payer: Medicaid Other | Admitting: Family Medicine

## 2018-10-04 ENCOUNTER — Other Ambulatory Visit: Payer: Self-pay

## 2018-10-04 VITALS — BP 130/84 | HR 65 | Temp 98.1°F | Ht 67.0 in | Wt 194.0 lb

## 2018-10-04 DIAGNOSIS — K219 Gastro-esophageal reflux disease without esophagitis: Secondary | ICD-10-CM

## 2018-10-04 DIAGNOSIS — I2510 Atherosclerotic heart disease of native coronary artery without angina pectoris: Secondary | ICD-10-CM | POA: Diagnosis not present

## 2018-10-04 DIAGNOSIS — M1A3790 Chronic gout due to renal impairment, unspecified ankle and foot, without tophus (tophi): Secondary | ICD-10-CM

## 2018-10-04 DIAGNOSIS — I10 Essential (primary) hypertension: Secondary | ICD-10-CM | POA: Diagnosis not present

## 2018-10-04 NOTE — Progress Notes (Signed)
BP 130/84   Pulse 65   Temp 98.1 F (36.7 C) (Oral)   Ht 5\' 7"  (1.702 m)   Wt 194 lb (88 kg)   SpO2 96%   BMI 30.38 kg/m    Subjective:    Patient ID: Gerald Hodges, male    DOB: 04-06-1935, 83 y.o.   MRN: [id number]  HPI: Gerald Hodges is a 83 y.o. male  Chief Complaint  Patient presents with  . Hypertension    f/u  . Gastroesophageal Reflux    omeprazole refill   Patient presenting today for 6 month f/u chronic conditions. A spanish medical translator was used for today's visit, but communication remained somewhat challenging because the patient is hard of hearing even with hearing aid in place. The best effort was made to get accurate hx and information for today's visit.   Recently saw Cardiology for f/u CAD, HTN. Imdur was increased and he was put back on plavix, otherwise was to continue current regimen. Not taking the plavix, it is unclear why. Taking lipitor for cholesterol management. Does not check home BPs. Denies CP, SOB, HAs, dizziness. Has nitro tabs for if he has anginal episodes but has not used one in a while per patient.   Has been having some gout flares in his left foot recently. The colchine does help when he has flares. On allopurinol daily. Tries to avoid foods he knows can trigger things.   GERD - unclear which PPI he is taking, patient was unsure. No breakthrough sxs noted.   Relevant past medical, surgical, family and social history reviewed and updated as indicated. Interim medical history since our last visit reviewed. Allergies and medications reviewed and updated.  Review of Systems  Per HPI unless specifically indicated above     Objective:    BP 130/84   Pulse 65   Temp 98.1 F (36.7 C) (Oral)   Ht 5\' 7"  (1.702 m)   Wt 194 lb (88 kg)   SpO2 96%   BMI 30.38 kg/m   Wt Readings from Last 3 Encounters:  10/04/18 194 lb (88 kg)  09/17/18 197 lb (89.4 kg)  02/15/18 187 lb 4 oz (84.9 kg)    Physical Exam Vitals  signs and nursing note reviewed.  Constitutional:      Appearance: Normal appearance.  HENT:     Head: Atraumatic.  Eyes:     Extraocular Movements: Extraocular movements intact.     Conjunctiva/sclera: Conjunctivae normal.  Neck:     Musculoskeletal: Normal range of motion and neck supple.  Cardiovascular:     Rate and Rhythm: Normal rate and regular rhythm.  Pulmonary:     Effort: Pulmonary effort is normal.     Breath sounds: Normal breath sounds.  Musculoskeletal: Normal range of motion.        General: No swelling or tenderness.  Skin:    General: Skin is warm and dry.     Findings: No erythema.  Neurological:     General: No focal deficit present.     Mental Status: He is oriented to person, place, and time.  Psychiatric:        Mood and Affect: Mood normal.        Thought Content: Thought content normal.        Judgment: Judgment normal.     Results for orders placed or performed in visit on 10/04/18  Uric acid  Result Value Ref Range   Uric Acid 6.7 3.7 - 8.6  mg/dL  Lipid Panel w/o Chol/HDL Ratio  Result Value Ref Range   Cholesterol, Total 106 100 - 199 mg/dL   Triglycerides 395 (H) 0 - 149 mg/dL   HDL 30 (L) >39 mg/dL   VLDL Cholesterol Cal 79 (H) 5 - 40 mg/dL   LDL Calculated Comment (A) 0 - 99 mg/dL      Assessment & Plan:   Problem List Items Addressed This Visit      Cardiovascular and Mediastinum   Essential hypertension, benign    Stable and WNL, continue current regimen      CAD (coronary artery disease)    Followed by Cardiology, continue per their recommendations. Reiterated to pt that he was to get back on the plavix per Cardiology and that it should have been sent over to his pharmacy      Relevant Orders   Lipid Panel w/o Chol/HDL Ratio (Completed)     Digestive   GERD (gastroesophageal reflux disease) - Primary    Continue PPI prn, diet modifications      Relevant Medications   omeprazole (PRILOSEC) 20 MG capsule     Other    Gout    Recheck uric acid, adjust allopurinol if needed with careful consideration of his kidney function. Colcrys prn for flares. Diet modifications reviewed at length      Relevant Orders   Uric acid (Completed)       Follow up plan: Return in about 6 months (around 04/06/2019) for 6 month f/u.

## 2018-10-05 LAB — LIPID PANEL W/O CHOL/HDL RATIO
Cholesterol, Total: 106 mg/dL (ref 100–199)
HDL: 30 mg/dL — ABNORMAL LOW (ref >39)
Triglycerides: 395 mg/dL — ABNORMAL HIGH (ref 0–149)
VLDL Cholesterol Cal: 79 mg/dL — ABNORMAL HIGH (ref 5–40)

## 2018-10-05 LAB — URIC ACID: Uric Acid: 6.7 mg/dL (ref 3.7–8.6)

## 2018-10-07 NOTE — Assessment & Plan Note (Signed)
Recheck uric acid, adjust allopurinol if needed with careful consideration of his kidney function. Colcrys prn for flares. Diet modifications reviewed at length

## 2018-10-07 NOTE — Assessment & Plan Note (Signed)
Continue PPI prn, diet modifications

## 2018-10-07 NOTE — Assessment & Plan Note (Signed)
Followed by Cardiology, continue per their recommendations. Reiterated to pt that he was to get back on the plavix per Cardiology and that it should have been sent over to his pharmacy

## 2018-10-07 NOTE — Assessment & Plan Note (Signed)
Stable and WNL, continue current regimen 

## 2018-10-25 DIAGNOSIS — R05 Cough: Secondary | ICD-10-CM | POA: Diagnosis not present

## 2018-10-25 DIAGNOSIS — Z20828 Contact with and (suspected) exposure to other viral communicable diseases: Secondary | ICD-10-CM | POA: Diagnosis not present

## 2018-11-07 DIAGNOSIS — R0602 Shortness of breath: Secondary | ICD-10-CM | POA: Diagnosis not present

## 2018-11-07 DIAGNOSIS — R531 Weakness: Secondary | ICD-10-CM | POA: Diagnosis not present

## 2018-11-07 DIAGNOSIS — J189 Pneumonia, unspecified organism: Secondary | ICD-10-CM | POA: Diagnosis not present

## 2018-11-07 DIAGNOSIS — J1289 Other viral pneumonia: Secondary | ICD-10-CM | POA: Diagnosis not present

## 2018-11-07 DIAGNOSIS — R05 Cough: Secondary | ICD-10-CM | POA: Diagnosis not present

## 2018-11-07 DIAGNOSIS — R0789 Other chest pain: Secondary | ICD-10-CM | POA: Diagnosis not present

## 2018-11-07 DIAGNOSIS — U071 COVID-19: Secondary | ICD-10-CM | POA: Diagnosis not present

## 2018-11-07 DIAGNOSIS — R918 Other nonspecific abnormal finding of lung field: Secondary | ICD-10-CM | POA: Diagnosis not present

## 2018-11-08 ENCOUNTER — Telehealth: Payer: Self-pay

## 2018-11-08 NOTE — Telephone Encounter (Signed)
MAC Patient  Called and spoke with patient's son, he went to urgent care and to [facility] yesterday for a cough, per son he has pneumonia and that thy will not give him a antibiotic. COVID + 1.5 weeks ago, chest xray done. Please review and advise of what we should do give COVID + and symptomatic.

## 2018-11-08 NOTE — Telephone Encounter (Addendum)
Chart from [facility] reviewed-   Patient with active COVID-19, diagnosed 1.5 weeks ago. ER provider thought that his symptoms were due to COVID. We cannot see him in the office due to his COVID, and unfortunately cannot do a virtual visit as he is Spanish speaking only. If he is not feeling better or feeling worse, I would advise him to go back to the ER.    From [facility] Note:   Assessment/Plan   COVID+ patient (diagnosed 1.5weeks ago) presenting for lack of improvement in his symptoms. COVID is known to be insidious, lasting up to 3-4 weeks in many individuals. Suspect that patient's symptoms represent the normal natural course of COVID. During his time of observation in the ED, he was never hypoxic and he did not desat when asked to ambulate around his ED room.   Per [place] Department of Health and Human Services ([place] DHHS) memorandum dated 09/16/2018, "Most people with COVID-19 have mild illness and can recover at home without medical care, consistent with guidance from the Centers for Disease Control and Prevention. Patients should be counseled to call if they have worsening signs or symptoms of respiratory illness (e.g. increasing fever, shortness of breathing, difficulty breathing, chest discomfort, altered thinking, cyanosis). Patients in high risk categories for clinical severity [...] should have more frequent follow up to assess clinical status. " This guidance is consistent with prior [place] DH[employer] memoranda dated 06/09/2018, 07/01/2018, 07/26/2018, and 08/20/2018.  Although patient is in a higher risk category for COVID complications due to his advanced age, he is stable at this time from a respiratory standpoint and has no acute complications related to his chronic medical conditions. He is saturating normally on room air and is ambulatory around the room without any decrease in his oxygen saturation.   Here, patient is well appearing, normal SpO2, no increased respiratory effort, and speaking full sentences. Given  that he has no oxygen requirement or dyspnea, and no complications of his chronic medical conditions at this time, there is no indication for inpatient admission or observation. CXR confirms COVID infection and does not change management. Patient to be discharged home with conservative management and self-monitoring, with strict return precautions for worsening shortness of breath and functional decline.   Pt's son Cristie Hem given anticipatory guidance that this can be an insidious process lasting nearly 3 weeks. Advised to continue to self-isolate at home to protect his household members.  The above medical decision making was discussed with the patient's son Cristie Hem; he expressed understanding and was in agreement with this assessment and plan.   CLINICAL INDICATION: 83 years old Male with SHORTNESS OF BREATH    COMPARISON: Chest radiograph 10/06/2012  TECHNIQUE: Portable Chest Radiograph.  FINDINGS:   Upper limits normal heart size.  The lungs are hypoinflated.  Bilateral nodular/patchy pulmonary parenchymal opacities are noted.  Bilateral small pleural effusions. No pneumothorax..   IMPRESSION:  Bilateral nodular/patchy pulmonary parenchymal opacities, concerning for pneumonia or metastasis. Recommend chest CT for further evaluation.

## 2018-11-08 NOTE — Telephone Encounter (Signed)
Patient's son notified.

## 2018-11-11 DIAGNOSIS — U071 COVID-19: Secondary | ICD-10-CM | POA: Diagnosis not present

## 2018-11-11 DIAGNOSIS — J189 Pneumonia, unspecified organism: Secondary | ICD-10-CM | POA: Diagnosis not present

## 2018-11-25 DIAGNOSIS — L409 Psoriasis, unspecified: Secondary | ICD-10-CM | POA: Diagnosis not present

## 2018-12-16 ENCOUNTER — Telehealth: Payer: Self-pay | Admitting: Family Medicine

## 2018-12-16 NOTE — Telephone Encounter (Signed)
[facility]: [phone]- ask for Clarene Critchley    Calling in to get pt's authorization code/number for referral. Pt has [place] Access, its required for visit. Pt has appt scheduled for 12/18/18

## 2018-12-18 ENCOUNTER — Other Ambulatory Visit: Payer: Self-pay | Admitting: Family Medicine

## 2018-12-18 ENCOUNTER — Other Ambulatory Visit: Payer: Self-pay | Admitting: Internal Medicine

## 2018-12-18 DIAGNOSIS — I10 Essential (primary) hypertension: Secondary | ICD-10-CM

## 2018-12-18 DIAGNOSIS — H903 Sensorineural hearing loss, bilateral: Secondary | ICD-10-CM | POA: Diagnosis not present

## 2018-12-18 DIAGNOSIS — H6123 Impacted cerumen, bilateral: Secondary | ICD-10-CM | POA: Diagnosis not present

## 2018-12-18 MED ORDER — LOSARTAN POTASSIUM 50 MG PO TABS: 50.0000 mg | ORAL_TABLET | Freq: Every day | ORAL | 0 refills | Status: DC

## 2018-12-18 NOTE — Telephone Encounter (Signed)
Medication Refill - Medication: losartan (COZAAR) 50 MG tablet   Has the patient contacted their pharmacy? Yes.   (Agent: If no, request that the patient contact the pharmacy for the refill.) (Agent: If yes, when and what did the pharmacy advise?)  Preferred Pharmacy (with phone number or street name): [id number] [address], [address] [facility]  Agent: Please be advised that RX refills may take up to 3 business days. We ask that you follow-up with your pharmacy.

## 2018-12-28 DIAGNOSIS — Z23 Encounter for immunization: Secondary | ICD-10-CM | POA: Diagnosis not present

## 2019-01-08 ENCOUNTER — Other Ambulatory Visit: Payer: Self-pay

## 2019-01-08 DIAGNOSIS — M109 Gout, unspecified: Secondary | ICD-10-CM

## 2019-01-08 MED ORDER — COLCHICINE 0.6 MG PO TABS: 0.6000 mg | ORAL_TABLET | Freq: Every day | ORAL | 4 refills | Status: DC

## 2019-01-10 ENCOUNTER — Telehealth: Payer: Self-pay

## 2019-01-10 NOTE — Telephone Encounter (Signed)
Tried to do a PA for colchicine vis [facility], states that his medicaid has been suspended.

## 2019-03-28 ENCOUNTER — Other Ambulatory Visit: Payer: Self-pay

## 2019-03-28 DIAGNOSIS — I10 Essential (primary) hypertension: Secondary | ICD-10-CM

## 2019-03-28 MED ORDER — AMLODIPINE BESYLATE 10 MG PO TABS: 10.0000 mg | ORAL_TABLET | Freq: Every day | ORAL | 0 refills | Status: DC

## 2019-03-28 MED ORDER — LOSARTAN POTASSIUM 50 MG PO TABS: 50.0000 mg | ORAL_TABLET | Freq: Every day | ORAL | 0 refills | Status: DC

## 2019-03-28 NOTE — Telephone Encounter (Signed)
[facility] faxed  Rx refill request on losartan 50 mg tab and amlodipine 10 mg tab

## 2019-04-07 NOTE — Progress Notes (Signed)
Cardiology Office Note    Date:  04/11/2019   ID:  [name], [name] [birth year], MRN [id number]  PCP:  Gerald Sizer, MD  Cardiologist:  Gerald Kendall, MD  Electrophysiologist:  None   Chief Complaint: Follow-up  History of Present Illness:   Gerald Hodges is a 84 y.o. male with history of CAD based on abnormal myocardial perfusion stress test at [facility] with chronic angina, TIA, CKD stage III, HTN, HLD, and GERD who presents for follow up.   Prior Myoview in 10/2016 at [facility] was without significant ischemia with a small in size, subtle in severity, fixed mid inferolateral and basal defect consistent with possible artifact but could not rule out subtle scar, EF 56%, no significant coronary artery calcification. Study was low risk and unchanged when compared to prior in 09/2012. Echo from 11/2017 showed an EF of 55-60%, mild LVH, moderate focal basal hypertrophy, Gr1DD, mild aortic valve thickening with mild regurgitation, normal RV size and function. When he was seen in 11/2017, he noted some left leg pain/paresthesias and underwent lower extremity ABIs in 11/2017 that showed borderline abnormal right toe brachial index with normal ABIs.  He was seen in 02/2018 and continued to note exertional dyspnea that had been stable for more than a year.  He was last seen in the office in 09/2018 and was doing reasonably well.  He continued to note unchanged intermittent chest discomfort with stable dyspnea.  He indicated he had been taking Lasix 80 mg twice daily, as that is what he thought he was prescribed.  Given his stable dyspnea/angina, underlying CKD, and in the setting of nonischemic Myoview that was unchanged when compared to prior continue medical management was recommended.  His Imdur was titrated to 90 mg daily.  It was also determined he was not taking amlodipine for unclear reasons leading to its resumption at that visit.  He was evaluated in 10/2018 at [facility] ED after having  been diagnosed with COVID-19 pneumonia 1.5 weeks prior in the setting of ongoing symptoms.  Vitals were stable and supportive care was recommended.  He comes in doing well from a cardiac perspective he does indicate one episode of chest pain that occurred at rest approximately 1 month prior and improved with 1 sublingual nitroglycerin.  He indicates pain lasted approximately 2 minutes.  Pain felt similar to his prior episodes.  He denies any increased dyspnea, lower extremity swelling, abdominal distention, orthopnea, PND, or early satiety.  No falls, BRBPR, or melena.  He has not been taking carvedilol secondary to losing his pill bottle.  Otherwise, he has been compliant with all medications.  He is concerned about a resting tremor.   Labs independently reviewed: 10/2018 - Hgb 14.6, PLT 408, troponin less than 0.034, potassium 4.3, BUN 21, serum creatinine 1.3, AST/ALT normal, albumin 4.0 09/2018 - TC 106, TG 395, HDL 30, LDL not calculated  Past Medical History:  Diagnosis Date  . Arthritis Left knee  . Chronic kidney disease    STAGE 3  . Coronary artery disease   . GERD (gastroesophageal reflux disease)   . Gout   . History of kidney problems   . Hyperlipemia 08/18/2014  . Hyperlipidemia   . Hypertension   . TIA (transient ischemic attack)     Past Surgical History:  Procedure Laterality Date  . BUNIONECTOMY Bilateral   . EYE SURGERY Bilateral    Cataractt Extraction with IOL  . TOTAL KNEE ARTHROPLASTY Left 10/25/2016   Procedure: TOTAL  KNEE ARTHROPLASTY;  Surgeon: Gerald Saint, MD;  Location: [facility] ORS;  Service: Orthopedics;  Laterality: Left;    Current Medications: Current Meds  Medication Sig  . allopurinol (ZYLOPRIM) 100 MG tablet TAKE 1 TABLET BY MOUTH EVERY DAY  . amLODipine (NORVASC) 10 MG tablet Take 1 tablet (10 mg total) by mouth daily.  Marland Kitchen atorvastatin (LIPITOR) 20 MG tablet TAKE 1 TABLET BY MOUTH EVERY DAY  . clopidogrel (PLAVIX) 75 MG tablet Take 1 tablet (75 mg  total) by mouth daily.  . colchicine 0.6 MG tablet Take 1 tablet (0.6 mg total) by mouth daily.  . furosemide (LASIX) 40 MG tablet Take 1-2 tablets daily as needed for swelling, weight gain, or shortness of breath.  . isosorbide mononitrate (IMDUR) 60 MG 24 hr tablet Take 1.5 tablets (90 mg total) by mouth daily.  Marland Kitchen losartan (COZAAR) 50 MG tablet Take 1 tablet (50 mg total) by mouth daily.  . nitroGLYCERIN (NITROSTAT) 0.4 MG SL tablet Place 1 tablet (0.4 mg total) under the tongue every 5 (five) minutes as needed for chest pain (chest pain). Maximum of 3 doses.  . pantoprazole (PROTONIX) 40 MG tablet Take 1 tablet (40 mg total) by mouth daily.   Current Facility-Administered Medications for the 04/11/19 encounter (Office Visit) with Gerald Barges, PA-C  Medication  . triamcinolone acetonide (KENALOG) 10 MG/ML injection 10 mg  . triamcinolone acetonide (KENALOG) 10 MG/ML injection 10 mg    Allergies:   Aspirin and Lisinopril   Social History   Socioeconomic History  . Marital status: Married    Spouse name: Not on file  . Number of children: Not on file  . Years of education: Not on file  . Highest education level: Not on file  Occupational History  . Not on file  Tobacco Use  . Smoking status: Never Smoker  . Smokeless tobacco: Never Used  Substance and Sexual Activity  . Alcohol use: No  . Drug use: No  . Sexual activity: Not Currently  Other Topics Concern  . Not on file  Social History Narrative  . Not on file   Social Determinants of Health   Financial Resource Strain:   . Difficulty of Paying Living Expenses: Not on file  Food Insecurity:   . Worried About Programme researcher, broadcasting/film/video in the Last Year: Not on file  . Ran Out of Food in the Last Year: Not on file  Transportation Needs:   . Lack of Transportation (Medical): Not on file  . Lack of Transportation (Non-Medical): Not on file  Physical Activity:   . Days of Exercise per Week: Not on file  . Minutes of Exercise  per Session: Not on file  Stress:   . Feeling of Stress : Not on file  Social Connections:   . Frequency of Communication with Friends and Family: Not on file  . Frequency of Social Gatherings with Friends and Family: Not on file  . Attends Religious Services: Not on file  . Active Member of Clubs or Organizations: Not on file  . Attends Banker Meetings: Not on file  . Marital Status: Not on file     Family History:  The patient's family history includes Cancer in his mother. There is no history of Heart disease.  ROS:   Review of Systems  Constitutional: Positive for malaise/fatigue. Negative for chills, diaphoresis, fever and weight loss.  HENT: Negative for congestion.   Eyes: Negative for discharge and redness.  Respiratory: Negative for cough,  hemoptysis, sputum production, shortness of breath and wheezing.   Cardiovascular: Positive for chest pain. Negative for palpitations, orthopnea, claudication, leg swelling and PND.  Gastrointestinal: Negative for abdominal pain, blood in stool, heartburn, melena, nausea and vomiting.  Genitourinary: Negative for hematuria.  Musculoskeletal: Negative for falls and myalgias.  Skin: Negative for rash.  Neurological: Positive for tremors. Negative for dizziness, tingling, sensory change, speech change, focal weakness, loss of consciousness and weakness.  Endo/Heme/Allergies: Does not bruise/bleed easily.  Psychiatric/Behavioral: Negative for substance abuse. The patient is not nervous/anxious.   All other systems reviewed and are negative.    EKGs/Labs/Other Studies Reviewed:    Studies reviewed were summarized above. The additional studies were reviewed today:  2D echo 11/2017: - Left ventricle: The cavity size was normal. Wall thickness was   increased in a pattern of mild LVH. There was moderate focal   basal hypertrophy. Systolic function was normal. The estimated   ejection fraction was in the range of 55% to 60%.  Wall motion was   normal; there were no regional wall motion abnormalities. Doppler   parameters are consistent with abnormal left ventricular   relaxation (grade 1 diastolic dysfunction). - Aortic valve: Trileaflet; mildly thickened leaflets. There was   mild regurgitation. - Right ventricle: The cavity size was normal. Systolic function   was normal.   EKG:  EKG is ordered today.  The EKG ordered today demonstrates NSR, 81 bpm, first-degree AV block, occasional PVCs in a pattern of ventricular bigeminy, possible inferior Q waves, nonspecific lateral ST-T changes  Recent Labs: 09/17/2018: ALT 24; BUN 25; Creatinine, Ser 1.48; Potassium 4.5; Sodium 137  Recent Lipid Panel    Component Value Date/Time   CHOL 106 10/04/2018 1642   TRIG 395 (H) 10/04/2018 1642   HDL 30 (L) 10/04/2018 1642   CHOLHDL 2.6 02/15/2018 0835   LDLCALC Comment (A) 10/04/2018 1642    PHYSICAL EXAM:    VS:  BP 136/84 (BP Location: Right Arm, Patient Position: Sitting, Cuff Size: Normal)   Pulse 81   Ht 5\' 7"  (1.702 m)   Wt 186 lb (84.4 kg)   SpO2 96%   BMI 29.13 kg/m   BMI: Body mass index is 29.13 kg/m.  Physical Exam  Constitutional: He is oriented to person, place, and time. He appears well-developed and well-nourished.  HENT:  Head: Normocephalic and atraumatic.  Eyes: Right eye exhibits no discharge. Left eye exhibits no discharge.  Neck: No JVD present.  Cardiovascular: Normal rate, regular rhythm, S1 normal, S2 normal and normal heart sounds. Exam reveals no distant heart sounds, no friction rub, no midsystolic click and no opening snap.  No murmur heard. Pulses:      Posterior tibial pulses are 2+ on the right side and 2+ on the left side.  Pulmonary/Chest: Effort normal and breath sounds normal. No respiratory distress. He has no decreased breath sounds. He has no wheezes. He has no rales. He exhibits no tenderness.  Abdominal: Soft. He exhibits no distension. There is no abdominal tenderness.   Musculoskeletal:        General: No edema.     Cervical back: Normal range of motion.  Neurological: He is alert and oriented to person, place, and time.  Skin: Skin is warm and dry. No cyanosis. Nails show no clubbing.  Psychiatric: He has a normal mood and affect. His speech is normal and behavior is normal. Judgment and thought content normal.    Wt Readings from Last 3 Encounters:  04/11/19  186 lb (84.4 kg)  10/04/18 194 lb (88 kg)  09/17/18 197 lb (89.4 kg)     ASSESSMENT & PLAN:   1. Stable angina: He is doing well with stable symptoms.  He did have one episode of chest discomfort approximately 1 month prior lasting 2 minutes and improved with one sublingual nitroglycerin.  Symptoms felt similar to his prior episodes.  In the setting of stable symptoms with recent low risk, essentially unchanged Myoview in 2018 and with underlying CKD we will continue current medical management with amlodipine, resumption of carvedilol, isosorbide mononitrate, and atorvastatin.  No plans for ischemic evaluation at this time.  2. Dyspnea on exertion: Stable to improved.  Dyspnea has been felt to be multifactorial including weight, physical deconditioning, diastolic dysfunction, and a component of CAD.  His weight is down 11 pounds when compared to visit in 09/2018.  Continue furosemide 40 mg 1-2 tabs daily as needed.  Check CMP and CBC today.  3. PVCs: Asymptomatic.  Check TSH, magnesium, and CMP.  Restart carvedilol as above.  4. Hypertension: Blood pressure is well controlled today.  Resume carvedilol as outlined above.  Otherwise, he will continue Norvasc, Imdur, losartan, and Lasix.  Low-sodium diet recommended.  5. Hyperlipidemia: Most recent calculated LDL unable to be obtained as outlined in his labs.  LDL of 38 from 02/2018.  He remains on atorvastatin 20 mg daily.  He is fasting.  Check FLP, CMP, and direct LDL.  6. CKD stage III: Most recent serum creatinine stable at 1.3.  Check  CMP.  7. History of COVID-19 infection: Patient tested positive in 10/2018 and did not require hospital admission.  He has an upcoming trip to [place] and has tested negative for Covid on 04/08/2019.  8. Language barrier: Hospital interpreter used for today's visit.  9. Tremor: Recommend patient follow-up with PCP.  Disposition: F/u with Dr. Okey Dupre or an APP in 6 months.   Medication Adjustments/Labs and Tests Ordered: Current medicines are reviewed at length with the patient today.  Concerns regarding medicines are outlined above. Medication changes, Labs and Tests ordered today are summarized above and listed in the Patient Instructions accessible in Encounters.   Signed, Eula Listen, PA-C 04/11/2019 8:22 AM     [facility] [address] [place], [place] [address] [phone]

## 2019-04-08 ENCOUNTER — Ambulatory Visit: Payer: Medicaid Other | Attending: Internal Medicine

## 2019-04-08 DIAGNOSIS — Z20822 Contact with and (suspected) exposure to covid-19: Secondary | ICD-10-CM

## 2019-04-09 LAB — NOVEL CORONAVIRUS, NAA: SARS-CoV-2, NAA: NOT DETECTED

## 2019-04-11 ENCOUNTER — Other Ambulatory Visit: Payer: Self-pay

## 2019-04-11 ENCOUNTER — Ambulatory Visit (INDEPENDENT_AMBULATORY_CARE_PROVIDER_SITE_OTHER): Payer: Medicaid Other | Admitting: Physician Assistant

## 2019-04-11 ENCOUNTER — Encounter: Payer: Self-pay | Admitting: Physician Assistant

## 2019-04-11 ENCOUNTER — Other Ambulatory Visit
Admission: RE | Admit: 2019-04-11 | Discharge: 2019-04-11 | Disposition: A | Discharge status: Home / Self Care | Payer: Medicaid Other | Source: Ambulatory Visit | Attending: Physician Assistant | Admitting: Physician Assistant

## 2019-04-11 ENCOUNTER — Telehealth: Payer: Self-pay

## 2019-04-11 ENCOUNTER — Ambulatory Visit (INDEPENDENT_AMBULATORY_CARE_PROVIDER_SITE_OTHER): Payer: Medicaid Other | Admitting: Nurse Practitioner

## 2019-04-11 ENCOUNTER — Other Ambulatory Visit: Payer: Self-pay | Admitting: Nurse Practitioner

## 2019-04-11 ENCOUNTER — Encounter: Payer: Self-pay | Admitting: Nurse Practitioner

## 2019-04-11 ENCOUNTER — Ambulatory Visit: Payer: Medicaid Other | Admitting: Family Medicine

## 2019-04-11 VITALS — BP 136/84 | HR 81 | Ht 67.0 in | Wt 186.0 lb

## 2019-04-11 VITALS — BP 112/74 | HR 90 | Temp 98.3°F | Wt 188.0 lb

## 2019-04-11 DIAGNOSIS — R251 Tremor, unspecified: Secondary | ICD-10-CM | POA: Diagnosis not present

## 2019-04-11 DIAGNOSIS — M109 Gout, unspecified: Secondary | ICD-10-CM | POA: Diagnosis not present

## 2019-04-11 DIAGNOSIS — I2510 Atherosclerotic heart disease of native coronary artery without angina pectoris: Secondary | ICD-10-CM | POA: Diagnosis not present

## 2019-04-11 DIAGNOSIS — E785 Hyperlipidemia, unspecified: Secondary | ICD-10-CM | POA: Diagnosis not present

## 2019-04-11 DIAGNOSIS — K219 Gastro-esophageal reflux disease without esophagitis: Secondary | ICD-10-CM | POA: Diagnosis not present

## 2019-04-11 DIAGNOSIS — N183 Chronic kidney disease, stage 3 unspecified: Secondary | ICD-10-CM

## 2019-04-11 DIAGNOSIS — I10 Essential (primary) hypertension: Secondary | ICD-10-CM

## 2019-04-11 DIAGNOSIS — I208 Other forms of angina pectoris: Secondary | ICD-10-CM

## 2019-04-11 DIAGNOSIS — Z789 Other specified health status: Secondary | ICD-10-CM | POA: Diagnosis not present

## 2019-04-11 DIAGNOSIS — R0609 Other forms of dyspnea: Secondary | ICD-10-CM

## 2019-04-11 DIAGNOSIS — R0600 Dyspnea, unspecified: Secondary | ICD-10-CM | POA: Diagnosis not present

## 2019-04-11 DIAGNOSIS — E039 Hypothyroidism, unspecified: Secondary | ICD-10-CM

## 2019-04-11 LAB — COMPREHENSIVE METABOLIC PANEL
ALT: 23 U/L (ref 0–44)
AST: 25 U/L (ref 15–41)
Albumin: 4.8 g/dL (ref 3.5–5.0)
Alkaline Phosphatase: 76 U/L (ref 38–126)
Anion gap: 10 (ref 5–15)
BUN: 30 mg/dL — ABNORMAL HIGH (ref 8–23)
CO2: 28 mmol/L (ref 22–32)
Calcium: 9.9 mg/dL (ref 8.9–10.3)
Chloride: 102 mmol/L (ref 98–111)
Creatinine, Ser: 1.58 mg/dL — ABNORMAL HIGH (ref 0.61–1.24)
GFR calc Af Amer: 47 mL/min — ABNORMAL LOW (ref >60)
GFR calc non Af Amer: 40 mL/min — ABNORMAL LOW (ref >60)
Glucose, Bld: 101 mg/dL — ABNORMAL HIGH (ref 70–99)
Potassium: 3.9 mmol/L (ref 3.5–5.1)
Sodium: 140 mmol/L (ref 135–145)
Total Bilirubin: 1.4 mg/dL — ABNORMAL HIGH (ref 0.3–1.2)
Total Protein: 8 g/dL (ref 6.5–8.1)

## 2019-04-11 LAB — LIPID PANEL
Cholesterol: 114 mg/dL (ref 0–200)
HDL: 42 mg/dL (ref >40)
LDL Cholesterol: 47 mg/dL (ref 0–99)
Total CHOL/HDL Ratio: 2.7 RATIO
Triglycerides: 126 mg/dL (ref <150)
VLDL: 25 mg/dL (ref 0–40)

## 2019-04-11 LAB — MAGNESIUM: Magnesium: 1.7 mg/dL (ref 1.7–2.4)

## 2019-04-11 LAB — LDL CHOLESTEROL, DIRECT: Direct LDL: NORMAL mg/dL (ref 0–99)

## 2019-04-11 LAB — TSH: TSH: 18.56 u[IU]/mL — ABNORMAL HIGH (ref 0.350–4.500)

## 2019-04-11 MED ORDER — NITROGLYCERIN 0.4 MG SL SUBL: 0.4000 mg | SUBLINGUAL_TABLET | SUBLINGUAL | 1 refills | Status: AC | PRN

## 2019-04-11 MED ORDER — ATORVASTATIN CALCIUM 20 MG PO TABS: 20.0000 mg | ORAL_TABLET | Freq: Every day | ORAL | 0 refills | Status: DC

## 2019-04-11 MED ORDER — CARVEDILOL 6.25 MG PO TABS: 6.2500 mg | ORAL_TABLET | Freq: Two times a day (BID) | ORAL | 3 refills | Status: DC

## 2019-04-11 MED ORDER — LOSARTAN POTASSIUM 50 MG PO TABS: 50.0000 mg | ORAL_TABLET | Freq: Every day | ORAL | 0 refills | Status: DC

## 2019-04-11 MED ORDER — ALLOPURINOL 100 MG PO TABS: 100.0000 mg | ORAL_TABLET | Freq: Every day | ORAL | 0 refills | Status: DC

## 2019-04-11 MED ORDER — PANTOPRAZOLE SODIUM 40 MG PO TBEC: 40.0000 mg | DELAYED_RELEASE_TABLET | Freq: Every day | ORAL | 0 refills | Status: DC

## 2019-04-11 MED ORDER — COLCHICINE 0.6 MG PO TABS: 0.6000 mg | ORAL_TABLET | Freq: Every day | ORAL | 0 refills | Status: DC

## 2019-04-11 MED ORDER — LEVOTHYROXINE SODIUM 25 MCG PO TABS: 25.0000 ug | ORAL_TABLET | Freq: Every day | ORAL | 0 refills | Status: DC

## 2019-04-11 MED ORDER — AMLODIPINE BESYLATE 10 MG PO TABS: 10.0000 mg | ORAL_TABLET | Freq: Every day | ORAL | 0 refills | Status: DC

## 2019-04-11 NOTE — Telephone Encounter (Signed)
Left Message - using spanish interpreter, message was left to call back for lab results.

## 2019-04-11 NOTE — Patient Instructions (Signed)
Medication Instructions:  1- RESUME Coreg *If you need a refill on your cardiac medications before your next appointment, please call your pharmacy*  Lab Work: 1- Your physician recommends that you return for lab work today at [facility] (CMET, TSH, Lipid w direct, Mag) No appt is needed. Hours are M-F 7AM- 6 PM.  If you have labs (blood work) drawn today and your tests are completely normal, you will receive your results only by: Marland Kitchen MyChart Message (if you have MyChart) OR . A paper copy in the mail If you have any lab test that is abnormal or we need to change your treatment, we will call you to review the results.  Testing/Procedures: None ordered   Follow-Up: At [facility], you and your health needs are our priority.  As part of our continuing mission to provide you with exceptional heart care, we have created designated Provider Care Teams.  These Care Teams include your primary Cardiologist (physician) and Advanced Practice Providers (APPs -  Physician Assistants and Nurse Practitioners) who all work together to provide you with the care you need, when you need it.  Your next appointment:   6 month(s)  The format for your next appointment:   In Person  Provider:    You may see Yvonne Kendall, MD or Eula Listen, PA-C.

## 2019-04-11 NOTE — Telephone Encounter (Signed)
Incoming call returned. Pt and family verbalized understanding and had no further questions at this time.   Advised pt to call for any further questions or concerns.

## 2019-04-11 NOTE — Telephone Encounter (Signed)
-----   Message from Sondra Barges, PA-C sent at 04/11/2019 10:58 AM EST ----- Renal function is mildly elevated though at his approximate baseline. Liver function is normal. Potassium is normal. Thyroid function is significantly abnormal. Cholesterol is well controlled.  Recommendations: -Patient needs to schedule an appointment with his PCP for further evaluation and treatment of his abnormal thyroid function -No changes in plan discussed in office visit today

## 2019-04-11 NOTE — Progress Notes (Signed)
BP 112/74   Pulse 90   Temp 98.3 F (36.8 C) (Oral)   Wt 188 lb (85.3 kg)   SpO2 98%   BMI 29.44 kg/m    Subjective:    Patient ID: Gerald Hodges, male    DOB: 12-Aug-1935, 84 y.o.   MRN: [id number]  HPI: Gerald Hodges is a 84 y.o. male  Chief Complaint  Patient presents with  . Hypertension  . Gastroesophageal Reflux  . Gout   A certified spanish interpreter was used for this visit.  HYPERTENSION / HYPERLIPIDEMIA Satisfied with current treatment? no Duration of hypertension: chronic BP medication side effects: no Past BP meds: losartan, amlodipine  Duration of hyperlipidemia: chronic Cholesterol medication side effects: no Cholesterol supplements: none Past cholesterol medications: atorvastain (lipitor) Medication compliance: excellent compliance Aspirin: no, plavix Recent stressors: no Recurrent headaches: no Visual changes: no Palpitations: no Dyspnea: yes Chest pain: yes -  1 episode of chest pain relieved by 1 nitroglycerin - saw Cardiology early this morning Lower extremity edema: feet have been swollen when he stands all day Dizzy/lightheaded: no  HYPOTHYROIDISM His TSH was checked at the Cardiology office this morning and was found to be over 18. Thyroid control status:uncontrolled Satisfied with current treatment? no  Etiology of hypothyroidism: unknown Fatigue: no Cold intolerance: no Heat intolerance: no Weight gain: no Weight loss: no Constipation: no Diarrhea/loose stools: no Palpitations: no Lower extremity edema: no Anxiety/depressed mood: no  Of note, the patient does report hoarseness and trouble with swallowing his morning medication.  The patients he is leaving for [place] on Wednesday 04/16/2019 and will be there for 3 months and will need his medications filled for at least that long.  Allergies  Allergen Reactions  . Aspirin Cough and Other (See Comments)  . Lisinopril Other (See Comments)    Throat  itching Throat itching Throat itching   Outpatient Encounter Medications as of 04/11/2019  Medication Sig  . amLODipine (NORVASC) 10 MG tablet Take 1 tablet (10 mg total) by mouth daily.  Marland Kitchen atorvastatin (LIPITOR) 20 MG tablet Take 1 tablet (20 mg total) by mouth daily.  . carvedilol (COREG) 6.25 MG tablet Take 1 tablet (6.25 mg total) by mouth 2 (two) times daily with a meal.  . clopidogrel (PLAVIX) 75 MG tablet Take 1 tablet (75 mg total) by mouth daily.  . colchicine 0.6 MG tablet Take 1 tablet (0.6 mg total) by mouth daily.  . furosemide (LASIX) 40 MG tablet Take 1-2 tablets daily as needed for swelling, weight gain, or shortness of breath.  . isosorbide mononitrate (IMDUR) 60 MG 24 hr tablet Take 1.5 tablets (90 mg total) by mouth daily.  Marland Kitchen losartan (COZAAR) 50 MG tablet Take 1 tablet (50 mg total) by mouth daily.  . nitroGLYCERIN (NITROSTAT) 0.4 MG SL tablet Place 1 tablet (0.4 mg total) under the tongue every 5 (five) minutes as needed for chest pain (chest pain). Maximum of 3 doses.  . pantoprazole (PROTONIX) 40 MG tablet Take 1 tablet (40 mg total) by mouth daily.  . [DISCONTINUED] amLODipine (NORVASC) 10 MG tablet Take 1 tablet (10 mg total) by mouth daily.  . [DISCONTINUED] atorvastatin (LIPITOR) 20 MG tablet TAKE 1 TABLET BY MOUTH EVERY DAY  . [DISCONTINUED] colchicine 0.6 MG tablet Take 1 tablet (0.6 mg total) by mouth daily.  . [DISCONTINUED] losartan (COZAAR) 50 MG tablet Take 1 tablet (50 mg total) by mouth daily.  . [DISCONTINUED] pantoprazole (PROTONIX) 40 MG tablet Take 1 tablet (40 mg  total) by mouth daily.  Marland Kitchen allopurinol (ZYLOPRIM) 100 MG tablet Take 1 tablet (100 mg total) by mouth daily.  Marland Kitchen levothyroxine (SYNTHROID) 25 MCG tablet Take 1 tablet (25 mcg total) by mouth daily before breakfast.  . [DISCONTINUED] allopurinol (ZYLOPRIM) 100 MG tablet TAKE 1 TABLET BY MOUTH EVERY DAY   Facility-Administered Encounter Medications as of 04/11/2019  Medication  . triamcinolone  acetonide (KENALOG) 10 MG/ML injection 10 mg  . triamcinolone acetonide (KENALOG) 10 MG/ML injection 10 mg   Patient Active Problem List   Diagnosis Date Noted  . Hypothyroidism 04/12/2019  . Dyspnea on exertion 02/15/2018  . Knee osteoarthritis 07/21/2016  . Allergic rhinitis 08/04/2015  . Stable angina (HCC) 06/28/2015  . Bunion of unspecified foot 06/28/2015  . Gout 10/09/2014  . Essential hypertension, benign 08/18/2014  . Psoriasis 08/18/2014  . Chronic kidney disease, stage III (moderate) 08/18/2014  . PAC (premature atrial contraction) 02/17/2014  . Chronic kidney disease 11/18/2013  . GERD (gastroesophageal reflux disease) 03/19/2013  . CAD (coronary artery disease) 03/18/2013  . Angina pectoris (HCC) 10/09/2012  . Hyperlipidemia LDL goal <70 10/06/2012   Past Medical History:  Diagnosis Date  . Arthritis Left knee  . Chronic kidney disease    STAGE 3  . Coronary artery disease   . GERD (gastroesophageal reflux disease)   . Gout   . History of kidney problems   . Hyperlipemia 08/18/2014  . Hyperlipidemia   . Hypertension   . TIA (transient ischemic attack)   . Total knee replacement status 10/25/2016   Relevant past medical, surgical, family and social history reviewed and updated as indicated. Interim medical history since our last visit reviewed.  Review of Systems  Constitutional: Negative.  Negative for chills, fatigue, fever and unexpected weight change.  Eyes: Negative.  Negative for visual disturbance.  Respiratory: Positive for shortness of breath (1 episode, relieved by Nitroglycerin). Negative for cough, chest tightness and wheezing.   Cardiovascular: Positive for chest pain (x1 episode, relieved by Nitroglycerin). Negative for palpitations and leg swelling.  Gastrointestinal: Negative.  Negative for constipation, diarrhea, nausea and vomiting.  Endocrine: Negative.  Negative for cold intolerance and heat intolerance.  Neurological: Negative.  Negative for  dizziness, tremors, light-headedness and headaches.  Psychiatric/Behavioral: Negative.  Negative for agitation, confusion and sleep disturbance. The patient is not nervous/anxious.    Per HPI unless specifically indicated above     Objective:    BP 112/74   Pulse 90   Temp 98.3 F (36.8 C) (Oral)   Wt 188 lb (85.3 kg)   SpO2 98%   BMI 29.44 kg/m   Wt Readings from Last 3 Encounters:  04/11/19 188 lb (85.3 kg)  04/11/19 186 lb (84.4 kg)  10/04/18 194 lb (88 kg)    Physical Exam Vitals and nursing note reviewed.  Constitutional:      General: He is not in acute distress.    Appearance: Normal appearance. He is normal weight. He is not ill-appearing or toxic-appearing.  Eyes:     General: No scleral icterus.    Extraocular Movements: Extraocular movements intact.     Conjunctiva/sclera: Conjunctivae normal.     Pupils: Pupils are equal, round, and reactive to light.  Neck:     Thyroid: No thyromegaly or thyroid tenderness.  Cardiovascular:     Rate and Rhythm: Normal rate and regular rhythm.     Pulses: Normal pulses.     Heart sounds: No murmur.  Pulmonary:     Effort: Pulmonary effort  is normal. No respiratory distress.     Breath sounds: Normal breath sounds. No wheezing or rhonchi.  Abdominal:     General: Abdomen is flat. Bowel sounds are normal. There is no distension.     Palpations: Abdomen is soft.  Musculoskeletal:     Cervical back: Normal range of motion. No rigidity.  Lymphadenopathy:     Cervical: No cervical adenopathy.  Skin:    General: Skin is warm and dry.     Capillary Refill: Capillary refill takes less than 2 seconds.     Coloration: Skin is not jaundiced.  Neurological:     Mental Status: He is alert and oriented to person, place, and time.     Motor: No weakness.     Gait: Gait normal.  Psychiatric:        Mood and Affect: Mood normal.        Behavior: Behavior normal.        Thought Content: Thought content normal.        Judgment:  Judgment normal.     Results for orders placed or performed during the hospital encounter of 04/11/19  TSH  Result Value Ref Range   TSH 18.560 (H) 0.350 - 4.500 uIU/mL  Comprehensive metabolic panel  Result Value Ref Range   Sodium 140 135 - 145 mmol/L   Potassium 3.9 3.5 - 5.1 mmol/L   Chloride 102 98 - 111 mmol/L   CO2 28 22 - 32 mmol/L   Glucose, Bld 101 (H) 70 - 99 mg/dL   BUN 30 (H) 8 - 23 mg/dL   Creatinine, Ser 1.58 (H) 0.61 - 1.24 mg/dL   Calcium 9.9 8.9 - 10.3 mg/dL   Total Protein 8.0 6.5 - 8.1 g/dL   Albumin 4.8 3.5 - 5.0 g/dL   AST 25 15 - 41 U/L   ALT 23 0 - 44 U/L   Alkaline Phosphatase 76 38 - 126 U/L   Total Bilirubin 1.4 (H) 0.3 - 1.2 mg/dL   GFR calc non Af Amer 40 (L) >60 mL/min   GFR calc Af Amer 47 (L) >60 mL/min   Anion gap 10 5 - 15  LDL cholesterol, direct  Result Value Ref Range   Direct LDL UNABLETO PERFORMED DUE TO NORMAL TRIG 0 - 99 mg/dL  Magnesium  Result Value Ref Range   Magnesium 1.7 1.7 - 2.4 mg/dL  Lipid panel  Result Value Ref Range   Cholesterol 114 0 - 200 mg/dL   Triglycerides 126 <150 mg/dL   HDL 42 >40 mg/dL   Total CHOL/HDL Ratio 2.7 RATIO   VLDL 25 0 - 40 mg/dL   LDL Cholesterol 47 0 - 99 mg/dL      Assessment & Plan:   Problem List Items Addressed This Visit      Cardiovascular and Mediastinum   Essential hypertension, benign    Chronic, stable.  BP well controlled in office today.  Continue current medication. Prescriptions refilled for 3 months.  UA and CBC checked in office today.      Relevant Medications   amLODipine (NORVASC) 10 MG tablet   atorvastatin (LIPITOR) 20 MG tablet   losartan (COZAAR) 50 MG tablet   Other Relevant Orders   UA/M w/rflx Culture, Routine (Completed)   CAD (coronary artery disease)   Relevant Medications   amLODipine (NORVASC) 10 MG tablet   atorvastatin (LIPITOR) 20 MG tablet   losartan (COZAAR) 50 MG tablet   pantoprazole (PROTONIX) 40 MG tablet  Digestive   GERD  (gastroesophageal reflux disease)    Chronic, stable.  Continue PPI, prescription refilled for 3 months.       Relevant Medications   pantoprazole (PROTONIX) 40 MG tablet     Endocrine   Hypothyroidism    TSH in cardiology office today >18.  Will start low dose of Synthroid given age.  Educated via interpreter that we typically recheck on thyroid levels when initiating medication in 6 weeks.  Patient will be in [place] for the next 3 months, strongly advised to have thyroid rechecked as soon as return to [place].  Patient verbalized understanding.       Relevant Medications   levothyroxine (SYNTHROID) 25 MCG tablet     Other   Gout - Primary    Chronic, stable.  Continue allopurinol and colchicine for flares.  Refills given for 3 months.      Relevant Medications   allopurinol (ZYLOPRIM) 100 MG tablet   colchicine 0.6 MG tablet   Other Relevant Orders   CBC with Differential/Platelet (Completed)   Bayer DCA Hb A1c Waived (Completed)   Hyperlipidemia LDL goal <70    Chronic, stable.  Lipids checked at cardiology office today and WNL.  Check A1c today in office.  Continue atorvastatin.  Refill given for 3 months.      Relevant Medications   amLODipine (NORVASC) 10 MG tablet   atorvastatin (LIPITOR) 20 MG tablet   losartan (COZAAR) 50 MG tablet       Follow up plan: Return in about 6 weeks (around 05/23/2019) for thyroid check.

## 2019-04-12 ENCOUNTER — Encounter: Payer: Self-pay | Admitting: Nurse Practitioner

## 2019-04-12 DIAGNOSIS — E039 Hypothyroidism, unspecified: Secondary | ICD-10-CM | POA: Insufficient documentation

## 2019-04-12 LAB — CBC WITH DIFFERENTIAL/PLATELET
Basophils Absolute: 0 10*3/uL (ref 0.0–0.2)
Basos: 0 %
EOS (ABSOLUTE): 0.1 10*3/uL (ref 0.0–0.4)
Eos: 2 %
Hematocrit: 40.4 % (ref 37.5–51.0)
Hemoglobin: 14.5 g/dL (ref 13.0–17.7)
Immature Grans (Abs): 0 10*3/uL (ref 0.0–0.1)
Immature Granulocytes: 0 %
Lymphocytes Absolute: 1.7 10*3/uL (ref 0.7–3.1)
Lymphs: 36 %
MCH: 32.5 pg (ref 26.6–33.0)
MCHC: 35.9 g/dL — ABNORMAL HIGH (ref 31.5–35.7)
MCV: 91 fL (ref 79–97)
Monocytes Absolute: 0.5 10*3/uL (ref 0.1–0.9)
Monocytes: 10 %
Neutrophils Absolute: 2.4 10*3/uL (ref 1.4–7.0)
Neutrophils: 52 %
Platelets: 274 10*3/uL (ref 150–450)
RBC: 4.46 x10E6/uL (ref 4.14–5.80)
RDW: 13.1 % (ref 11.6–15.4)
WBC: 4.7 10*3/uL (ref 3.4–10.8)

## 2019-04-12 LAB — BAYER DCA HB A1C WAIVED: HB A1C (BAYER DCA - WAIVED): 5.7 % (ref <7.0)

## 2019-04-12 LAB — UA/M W/RFLX CULTURE, ROUTINE
Bilirubin, UA: NEGATIVE
Glucose, UA: NEGATIVE
Ketones, UA: NEGATIVE
Leukocytes,UA: NEGATIVE
Nitrite, UA: NEGATIVE
Protein,UA: NEGATIVE
RBC, UA: NEGATIVE
Specific Gravity, UA: 1.01 (ref 1.005–1.030)
Urobilinogen, Ur: 0.2 mg/dL (ref 0.2–1.0)
pH, UA: 6 (ref 5.0–7.5)

## 2019-04-12 NOTE — Assessment & Plan Note (Signed)
TSH in cardiology office today >18.  Will start low dose of Synthroid given age.  Educated via interpreter that we typically recheck on thyroid levels when initiating medication in 6 weeks.  Patient will be in [place] for the next 3 months, strongly advised to have thyroid rechecked as soon as return to [place].  Patient verbalized understanding.

## 2019-04-12 NOTE — Assessment & Plan Note (Signed)
Chronic, stable.  Continue allopurinol and colchicine for flares.  Refills given for 3 months.

## 2019-04-12 NOTE — Assessment & Plan Note (Signed)
Chronic, stable.  Continue PPI, prescription refilled for 3 months.

## 2019-04-12 NOTE — Assessment & Plan Note (Addendum)
Chronic, stable.  Lipids checked at cardiology office today and WNL.  Check A1c today in office.  Continue atorvastatin.  Refill given for 3 months.

## 2019-04-12 NOTE — Assessment & Plan Note (Addendum)
Chronic, stable.  BP well controlled in office today.  Continue current medication. Prescriptions refilled for 3 months.  UA and CBC checked in office today.

## 2019-04-17 NOTE — Progress Notes (Signed)
6 weeks from 04/11/2019 would be great.

## 2019-04-26 ENCOUNTER — Ambulatory Visit: Payer: Medicaid Other | Attending: Internal Medicine

## 2019-04-26 DIAGNOSIS — Z23 Encounter for immunization: Secondary | ICD-10-CM | POA: Insufficient documentation

## 2019-04-26 NOTE — Progress Notes (Signed)
   Covid-19 Vaccination Clinic  Name:  Gerald Hodges    MRN: [id number] DOB: 06-11-1935  04/26/2019  [name] was observed post Covid-19 immunization for 30 minutes based on pre-vaccination screening without incidence. He was provided with Vaccine Information Sheet and instruction to access the V-Safe system.   [name] was instructed to call 911 with any severe reactions post vaccine: Marland Kitchen Difficulty breathing  . Swelling of your face and throat  . A fast heartbeat  . A bad rash all over your body  . Dizziness and weakness    Immunizations Administered    Name Date Dose VIS Date Route   Pfizer COVID-19 Vaccine 04/26/2019  9:01 AM 0.3 mL 02/21/2019 Intramuscular   Manufacturer: [employer], [employer]   Lot: [id number]   NDC: [address]-4

## 2019-05-17 ENCOUNTER — Ambulatory Visit: Payer: Medicaid Other | Attending: Internal Medicine

## 2019-05-17 DIAGNOSIS — Z23 Encounter for immunization: Secondary | ICD-10-CM | POA: Insufficient documentation

## 2019-05-17 NOTE — Progress Notes (Signed)
   Covid-19 Vaccination Clinic  Name:  Gerald Hodges    MRN: [id number] DOB: 1935/10/18  05/17/2019  [name] was observed post Covid-19 immunization for 30 minutes based on pre-vaccination screening without incident. He was provided with Vaccine Information Sheet and instruction to access the V-Safe system.   [name] was instructed to call 911 with any severe reactions post vaccine: Marland Kitchen Difficulty breathing  . Swelling of face and throat  . A fast heartbeat  . A bad rash all over body  . Dizziness and weakness   Immunizations Administered    Name Date Dose VIS Date Route   Pfizer COVID-19 Vaccine 05/17/2019  8:59 AM 0.3 mL 02/21/2019 Intramuscular   Manufacturer: [employer], [employer]   Lot: [id number]   NDC: [address]-2

## 2019-05-22 ENCOUNTER — Ambulatory Visit: Payer: Self-pay | Admitting: Nurse Practitioner

## 2019-06-09 DIAGNOSIS — Z20822 Contact with and (suspected) exposure to covid-19: Secondary | ICD-10-CM | POA: Diagnosis not present

## 2019-10-15 ENCOUNTER — Ambulatory Visit: Payer: Medicaid Other | Admitting: Internal Medicine

## 2019-10-29 ENCOUNTER — Telehealth: Payer: Self-pay | Admitting: Family Medicine

## 2019-10-29 NOTE — Telephone Encounter (Signed)
Erroneous entry

## 2019-10-31 ENCOUNTER — Ambulatory Visit: Payer: Medicaid Other | Admitting: Nurse Practitioner

## 2019-11-03 ENCOUNTER — Other Ambulatory Visit: Payer: Self-pay

## 2019-11-03 ENCOUNTER — Ambulatory Visit (INDEPENDENT_AMBULATORY_CARE_PROVIDER_SITE_OTHER): Payer: Medicaid Other | Admitting: Nurse Practitioner

## 2019-11-03 ENCOUNTER — Encounter: Payer: Self-pay | Admitting: Nurse Practitioner

## 2019-11-03 VITALS — BP 142/84 | HR 71 | Temp 98.1°F | Wt 185.0 lb

## 2019-11-03 DIAGNOSIS — I10 Essential (primary) hypertension: Secondary | ICD-10-CM

## 2019-11-03 DIAGNOSIS — R109 Unspecified abdominal pain: Secondary | ICD-10-CM | POA: Diagnosis not present

## 2019-11-03 DIAGNOSIS — I2510 Atherosclerotic heart disease of native coronary artery without angina pectoris: Secondary | ICD-10-CM | POA: Diagnosis not present

## 2019-11-03 DIAGNOSIS — M109 Gout, unspecified: Secondary | ICD-10-CM | POA: Diagnosis not present

## 2019-11-03 DIAGNOSIS — E039 Hypothyroidism, unspecified: Secondary | ICD-10-CM | POA: Diagnosis not present

## 2019-11-03 DIAGNOSIS — N281 Cyst of kidney, acquired: Secondary | ICD-10-CM

## 2019-11-03 DIAGNOSIS — Z8719 Personal history of other diseases of the digestive system: Secondary | ICD-10-CM | POA: Insufficient documentation

## 2019-11-03 DIAGNOSIS — R0602 Shortness of breath: Secondary | ICD-10-CM

## 2019-11-03 DIAGNOSIS — K429 Umbilical hernia without obstruction or gangrene: Secondary | ICD-10-CM

## 2019-11-03 LAB — MICROSCOPIC EXAMINATION
Bacteria, UA: NONE SEEN
RBC, Urine: NONE SEEN /HPF (ref 0–2)
WBC, UA: NONE SEEN /HPF (ref 0–5)

## 2019-11-03 LAB — UA/M W/RFLX CULTURE, ROUTINE
Bilirubin, UA: NEGATIVE
Glucose, UA: NEGATIVE
Ketones, UA: NEGATIVE
Leukocytes,UA: NEGATIVE
Nitrite, UA: NEGATIVE
RBC, UA: NEGATIVE
Specific Gravity, UA: 1.015 (ref 1.005–1.030)
Urobilinogen, Ur: 1 mg/dL (ref 0.2–1.0)
pH, UA: 5.5 (ref 5.0–7.5)

## 2019-11-03 LAB — MICROALBUMIN, URINE WAIVED
Creatinine, Urine Waived: 200 mg/dL (ref 10–300)
Microalb, Ur Waived: 80 mg/L — ABNORMAL HIGH (ref 0–19)

## 2019-11-03 MED ORDER — LOSARTAN POTASSIUM 50 MG PO TABS: 50.0000 mg | ORAL_TABLET | Freq: Every day | ORAL | 1 refills | Status: DC

## 2019-11-03 MED ORDER — ATORVASTATIN CALCIUM 20 MG PO TABS: 20.0000 mg | ORAL_TABLET | Freq: Every day | ORAL | 1 refills | Status: DC

## 2019-11-03 MED ORDER — LEVOTHYROXINE SODIUM 25 MCG PO TABS: 25.0000 ug | ORAL_TABLET | Freq: Every day | ORAL | 1 refills | Status: DC

## 2019-11-03 MED ORDER — AMLODIPINE BESYLATE 10 MG PO TABS: 10.0000 mg | ORAL_TABLET | Freq: Every day | ORAL | 1 refills | Status: DC

## 2019-11-03 MED ORDER — PANTOPRAZOLE SODIUM 40 MG PO TBEC: 40.0000 mg | DELAYED_RELEASE_TABLET | Freq: Every day | ORAL | 1 refills | Status: DC

## 2019-11-03 MED ORDER — ALLOPURINOL 100 MG PO TABS: 100.0000 mg | ORAL_TABLET | Freq: Every day | ORAL | 1 refills | Status: DC

## 2019-11-03 NOTE — Assessment & Plan Note (Signed)
Acute, ongoing.  Likely related to umbilical hernia, see umbilical hernia A/P.

## 2019-11-03 NOTE — Assessment & Plan Note (Signed)
Chronic, stable.  Continue allopurinol and colchicine as needed.  Refills given today.

## 2019-11-03 NOTE — Progress Notes (Signed)
BP (!) 142/84    Pulse 71    Temp 98.1 F (36.7 C) (Oral)    Wt 185 lb (83.9 kg)    SpO2 98%    BMI 28.98 kg/m    Subjective:    Patient ID: Gerald Hodges, male    DOB: 09-22-1935, 84 y.o.   MRN: [id number]  HPI: Gerald Hodges is a 84 y.o. male  Chief Complaint  Patient presents with   Abdominal Pain    right side tenderness to touch. pt states he run some test, ultra sound and x-rays about 2-3 months ago, was told he has a hernia in right side abdomen and cysts in his kidneys    ABDOMINAL PAIN  Son reports patient was having pain while in [place] and had an ultrasound done that showed cysts on both of his kidneys and hernia on right side of his abdomen.  Patient reports still having this pain intermittently, especially when he is picking up something heavy.  The pain is getting progressively worse. Duration:months Onset: sudden Severity: 5/10 Quality: sharp Location:  RLQ  Episode duration:  Radiation: no Frequency: intermittent Alleviating factors: nothing Aggravating factors: pushing on it, lifting Status: worse Treatments attempted: nothing Fever: no Nausea: no Vomiting: yes  Weight loss: no Decreased appetite: yes Diarrhea: no Constipation: yes Blood in stool: no Heartburn: yes Jaundice: no Rash: no Dysuria/urinary frequency: no Hematuria: no History of sexually transmitted disease: no Recurrent NSAID use: no  KIDNEY CYST Patient's son reports patient had an ultrasound while visiting [place] and the report showed multiple kidney cysts.  They have a copy of the report, it is written in [place].  HYPOTHYROIDISM Levothyroxine was adjusted in late January - shortly before the patient left for [place]  His TSH has not been rechecked since and it is unclear if he is taking his levothyroxine currently.  Thyroid control status:unknown Satisfied with current treatment? yes Medication side effects: no Medication compliance:  excellent compliance Etiology of hypothyroidism:  Recent dose adjustment:yes Fatigue: no Cold intolerance: no Heat intolerance: no Weight gain: no Weight loss: no Constipation: no Diarrhea/loose stools: no Palpitations: no Lower extremity edema: no Anxiety/depressed mood: no  HYPERTENSION Hypertension status: controlled  Satisfied with current treatment? yes Duration of hypertension: chronic BP monitoring frequency:  not checking BP medication side effects:  no Medication compliance: excellent compliance Previous BP meds: amlodipine, losartan Aspirin: no Recurrent headaches: no Visual changes: no Palpitations: no Dyspnea: yes; feels bad when takes a deep breath in Chest pain: no Lower extremity edema: no Dizzy/lightheaded: no  CORONARY ARTERY DISEASE Patient follows with Cardiology and is due to see them soon.  Currently taking Carvedilol 6.25 mg twice daily, plavix 75 mg daily, furosemide 40 mg as needed, Imdur 60 mg daily, and nitroglycerin prn. Angina frequency: rarely Chest pain: no Edema: no Orthopnea: no Paroxysmal nocturnal dyspnea: no Dyspnea on exertion: no Pneumovax:  Up to Date Aspirin: no - Plavix   Allergies  Allergen Reactions   Aspirin Cough and Other (See Comments)   Lisinopril Other (See Comments)    Throat itching Throat itching Throat itching   Outpatient Encounter Medications as of 11/03/2019  Medication Sig   allopurinol (ZYLOPRIM) 100 MG tablet Take 1 tablet (100 mg total) by mouth daily.   amLODipine (NORVASC) 10 MG tablet Take 1 tablet (10 mg total) by mouth daily.   atorvastatin (LIPITOR) 20 MG tablet Take 1 tablet (20 mg total) by mouth daily.   carvedilol (COREG) 6.25  MG tablet Take 1 tablet (6.25 mg total) by mouth 2 (two) times daily with a meal.   clopidogrel (PLAVIX) 75 MG tablet Take 1 tablet (75 mg total) by mouth daily.   colchicine 0.6 MG tablet Take 1 tablet (0.6 mg total) by mouth daily.   furosemide (LASIX) 40 MG  tablet Take 1-2 tablets daily as needed for swelling, weight gain, or shortness of breath.   isosorbide mononitrate (IMDUR) 60 MG 24 hr tablet Take 1.5 tablets (90 mg total) by mouth daily.   levothyroxine (SYNTHROID) 25 MCG tablet Take 1 tablet (25 mcg total) by mouth daily before breakfast.   losartan (COZAAR) 50 MG tablet Take 1 tablet (50 mg total) by mouth daily.   nitroGLYCERIN (NITROSTAT) 0.4 MG SL tablet Place 1 tablet (0.4 mg total) under the tongue every 5 (five) minutes as needed for chest pain (chest pain). Maximum of 3 doses.   pantoprazole (PROTONIX) 40 MG tablet Take 1 tablet (40 mg total) by mouth daily.   [DISCONTINUED] allopurinol (ZYLOPRIM) 100 MG tablet Take 1 tablet (100 mg total) by mouth daily.   [DISCONTINUED] amLODipine (NORVASC) 10 MG tablet Take 1 tablet (10 mg total) by mouth daily.   [DISCONTINUED] atorvastatin (LIPITOR) 20 MG tablet Take 1 tablet (20 mg total) by mouth daily.   [DISCONTINUED] levothyroxine (SYNTHROID) 25 MCG tablet Take 1 tablet (25 mcg total) by mouth daily before breakfast.   [DISCONTINUED] losartan (COZAAR) 50 MG tablet Take 1 tablet (50 mg total) by mouth daily.   [DISCONTINUED] pantoprazole (PROTONIX) 40 MG tablet Take 1 tablet (40 mg total) by mouth daily.   Facility-Administered Encounter Medications as of 11/03/2019  Medication   triamcinolone acetonide (KENALOG) 10 MG/ML injection 10 mg   triamcinolone acetonide (KENALOG) 10 MG/ML injection 10 mg   Patient Active Problem List   Diagnosis Date Noted   Abdominal pain 11/03/2019   Umbilical hernia without obstruction and without gangrene 11/03/2019   Renal cyst 11/03/2019   Hypothyroidism 04/12/2019   Dyspnea on exertion 02/15/2018   Osteoarthritis of knee 10/11/2016   Chronic diastolic congestive heart failure (HCC) 08/02/2016   Knee osteoarthritis 07/21/2016   Allergic rhinitis 08/04/2015   Stable angina (HCC) 06/28/2015   Bunion of unspecified foot 06/28/2015    Gout 10/09/2014   Essential hypertension, benign 08/18/2014   Psoriasis 08/18/2014   Chronic kidney disease, stage III (moderate) 08/18/2014   PAC (premature atrial contraction) 02/17/2014   Chronic kidney disease 11/18/2013   GERD (gastroesophageal reflux disease) 03/19/2013   CAD (coronary artery disease) 03/18/2013   Angina pectoris (HCC) 10/09/2012   Hyperlipidemia LDL goal <70 10/06/2012   Past Medical History:  Diagnosis Date   Arthritis Left knee   Chronic kidney disease    STAGE 3   Coronary artery disease    GERD (gastroesophageal reflux disease)    Gout    History of kidney problems    Hyperlipemia 08/18/2014   Hyperlipidemia    Hypertension    TIA (transient ischemic attack)    Total knee replacement status 10/25/2016   Relevant past medical, surgical, family and social history reviewed and updated as indicated. Interim medical history since our last visit reviewed.  Review of Systems  Constitutional: Positive for appetite change. Negative for activity change, chills, fatigue and fever.  Eyes: Negative.  Negative for visual disturbance.  Respiratory: Negative.  Negative for cough, chest tightness, shortness of breath and wheezing.   Cardiovascular: Negative.  Negative for chest pain, palpitations and leg swelling.  Gastrointestinal: Positive  for abdominal pain. Negative for abdominal distention, blood in stool, constipation, diarrhea, nausea and vomiting.  Endocrine: Negative.  Negative for cold intolerance and heat intolerance.  Musculoskeletal: Negative.  Negative for arthralgias and back pain.  Skin: Negative.  Negative for color change and wound.  Neurological: Negative.  Negative for dizziness, light-headedness and headaches.  Psychiatric/Behavioral: Negative.  Negative for confusion, decreased concentration and sleep disturbance. The patient is not nervous/anxious.     Per HPI unless specifically indicated above     Objective:    BP  (!) 142/84    Pulse 71    Temp 98.1 F (36.7 C) (Oral)    Wt 185 lb (83.9 kg)    SpO2 98%    BMI 28.98 kg/m   Wt Readings from Last 3 Encounters:  11/03/19 185 lb (83.9 kg)  04/11/19 188 lb (85.3 kg)  04/11/19 186 lb (84.4 kg)    Physical Exam Vitals and nursing note reviewed.  Constitutional:      General: He is not in acute distress.    Appearance: Normal appearance. He is well-developed. He is obese.  HENT:     Head: Normocephalic and atraumatic.  Eyes:     General: No scleral icterus.    Extraocular Movements: Extraocular movements intact.  Neck:     Vascular: No carotid bruit.  Cardiovascular:     Rate and Rhythm: Normal rate and regular rhythm.     Heart sounds: Normal heart sounds. No murmur heard.   Pulmonary:     Effort: Pulmonary effort is normal. No respiratory distress.     Breath sounds: Normal breath sounds. No wheezing, rhonchi or rales.  Abdominal:     General: Abdomen is flat. Bowel sounds are normal. There is no distension.     Tenderness: There is abdominal tenderness in the right lower quadrant.     Hernia: A hernia is present. Hernia is present in the umbilical area.  Musculoskeletal:        General: Normal range of motion.     Cervical back: Normal range of motion.     Right lower leg: No edema.     Left lower leg: No edema.  Skin:    General: Skin is warm and dry.     Coloration: Skin is not jaundiced or pale.  Neurological:     General: No focal deficit present.     Mental Status: He is alert and oriented to person, place, and time.     Motor: No weakness.     Gait: Gait normal.  Psychiatric:        Mood and Affect: Mood normal.        Behavior: Behavior normal.        Thought Content: Thought content normal.        Judgment: Judgment normal.        Assessment & Plan:   Problem List Items Addressed This Visit      Cardiovascular and Mediastinum   Essential hypertension, benign    Chronic, stable.  BP slightly elevated in office today  and unclear whether taking blood pressure medication or not.  Will refill amlodipine 10 mg and losartan 50 mg today.  Will check urine microalbumin, CMP, CBC, and UA today.  Follow up in 1 month.       Relevant Medications   amLODipine (NORVASC) 10 MG tablet   atorvastatin (LIPITOR) 20 MG tablet   losartan (COZAAR) 50 MG tablet   Other Relevant Orders   Comprehensive metabolic  panel   Microalbumin, Urine Waived (Completed)   CBC with Differential/Platelet   CAD (coronary artery disease)    Chronic, ongoing.  Follows with Cardiology, continue to follow with their recommendations.  Strongly encouraged follow up with them as soon as able.  Follow up in 1 month.      Relevant Medications   amLODipine (NORVASC) 10 MG tablet   atorvastatin (LIPITOR) 20 MG tablet   losartan (COZAAR) 50 MG tablet   pantoprazole (PROTONIX) 40 MG tablet     Endocrine   Hypothyroidism - Primary    Chronic, ongoing.  Will check TSH today and sent in refills of levothyroxine.  Follow up in 1 month.      Relevant Medications   levothyroxine (SYNTHROID) 25 MCG tablet   Other Relevant Orders   TSH   CBC with Differential/Platelet     Genitourinary   Renal cyst    Acute, ongoing.  Incidentally found on ultrasound imaging from [place]  Will need to get report interpreted into [place].  In meantime, will check labs and UA.  Educated that most renal cysts are found incidentally and may no necessarily require intervention but close monitoring.  If CMP and UA normal, may repeat renal ultrasound and consider referral to urology for further work-up at that time.        Other   Gout    Chronic, stable.  Continue allopurinol and colchicine as needed.  Refills given today.      Relevant Medications   allopurinol (ZYLOPRIM) 100 MG tablet   Abdominal pain    Acute, ongoing.  Likely related to umbilical hernia, see umbilical hernia A/P.      Relevant Orders   UA/M w/rflx Culture, Routine (Completed)   CBC  with Differential/Platelet   Umbilical hernia without obstruction and without gangrene    Acute, ongoing.  First noted on abdominal ultrasound report from [place]  Will need to have report interpreted.  Evident on examination today and with progressive discomfort the the patient - will place referral to general surgery to discuss options for ongoing management of hernia.      Relevant Orders   Ambulatory referral to General Surgery    Other Visit Diagnoses    Shortness of breath       Acute, ongoing.  Only with certain movements.  Will obtain CXR to rule out pneumonia.   Relevant Orders   DG Chest 2 View   Need for pneumococcal vaccine       Relevant Orders   Pneumococcal conjugate vaccine 13-valent IM       Follow up plan: Return in about 1 month (around 12/04/2019) for follow up.   45+ minutes spent with the patient during this encounter.

## 2019-11-03 NOTE — Assessment & Plan Note (Addendum)
Chronic, stable.  BP slightly elevated in office today and unclear whether taking blood pressure medication or not.  Will refill amlodipine 10 mg and losartan 50 mg today.  Will check urine microalbumin, CMP, CBC, and UA today.  Follow up in 1 month.

## 2019-11-03 NOTE — Assessment & Plan Note (Signed)
Acute, ongoing.  Incidentally found on ultrasound imaging from [place]  Will need to get report interpreted into [place].  In meantime, will check labs and UA.  Educated that most renal cysts are found incidentally and may no necessarily require intervention but close monitoring.  If CMP and UA normal, may repeat renal ultrasound and consider referral to urology for further work-up at that time.

## 2019-11-03 NOTE — Assessment & Plan Note (Addendum)
Chronic, ongoing.  Follows with Cardiology, continue to follow with their recommendations.  Strongly encouraged follow up with them as soon as able.  Follow up in 1 month.

## 2019-11-03 NOTE — Assessment & Plan Note (Signed)
Acute, ongoing.  First noted on abdominal ultrasound report from [place]  Will need to have report interpreted.  Evident on examination today and with progressive discomfort the the patient - will place referral to general surgery to discuss options for ongoing management of hernia.

## 2019-11-03 NOTE — Assessment & Plan Note (Signed)
Chronic, ongoing.  Will check TSH today and sent in refills of levothyroxine.  Follow up in 1 month.

## 2019-11-03 NOTE — Patient Instructions (Addendum)
Nice seeing you today!    We will let you know the results from your blood work later this week.  Please to [facility] to get the chest x-ray today.  You will hear from our office to schedule an appointment with a surgeon for the hernia.  Be sure to reach out to your Cardiologist to schedule a follow up appointment with them.  Let's follow up and see how you are doing in about 1 month.   Hernia, Adult     A hernia happens when tissue inside your body pushes out through a weak spot in your belly muscles (abdominal wall). This makes a round lump (bulge). The lump may be:  In a scar from surgery that was done in your belly (incisional hernia).  Near your belly button (umbilical hernia).  In your groin (inguinal hernia). Your groin is the area where your leg meets your lower belly (abdomen). This kind of hernia could also be: ? In your scrotum, if you are male. ? In folds of skin around your vagina, if you are male.  In your upper thigh (femoral hernia).  Inside your belly (hiatal hernia). This happens when your stomach slides above the muscle between your belly and your chest (diaphragm). If your hernia is small and it does not cause pain, you may not need treatment. If your hernia is large or it causes pain, you may need surgery. Follow these instructions at home: Activity  Avoid stretching or overusing (straining) the muscles near your hernia. Straining can happen when you: ? Lift something heavy. ? Poop (have a bowel movement).  Do not lift anything that is heavier than 10 lb (4.5 kg), or the limit that you are told, until your doctor says that it is safe.  Use the strength of your legs when you lift something heavy. Do not use only your back muscles to lift. General instructions  Do these things if told by your doctor so you do not have trouble pooping (constipation): ? Drink enough fluid to keep your pee (urine) pale yellow. ? Eat foods that are high  in fiber. These include fresh fruits and vegetables, whole grains, and beans. ? Limit foods that are high in fat and processed sugars. These include foods that are fried or sweet. ? Take medicine for trouble pooping.  When you cough, try to cough gently.  You may try to push your hernia in by very gently pressing on it when you are lying down. Do not try to force the bulge back in if it will not push in easily.  If you are overweight, work with your doctor to lose weight safely.  Do not use any products that have nicotine or tobacco in them. These include cigarettes and e-cigarettes. If you need help quitting, ask your doctor.  If you will be having surgery (hernia repair), watch your hernia for changes in shape, size, or color. Tell your doctor if you see any changes.  Take over-the-counter and prescription medicines only as told by your doctor.  Keep all follow-up visits as told by your doctor. Contact a doctor if:  You get new pain, swelling, or redness near your hernia.  You poop fewer times in a week than normal.  You have trouble pooping.  You have poop (stool) that is more dry than normal.  You have poop that is harder or larger than normal. Get help right away if:  You have a fever.  You have belly pain that  gets worse.  You feel sick to your stomach (nauseous).  You throw up (vomit).  Your hernia cannot be pushed in by very gently pressing on it when you are lying down. Do not try to force the bulge back in if it will not push in easily.  Your hernia: ? Changes in shape or size. ? Changes color. ? Feels hard or it hurts when you touch it. These symptoms may represent a serious problem that is an emergency. Do not wait to see if the symptoms will go away. Get medical help right away. Call your local emergency services (911 in the [place] Summary  A hernia happens when tissue inside your body pushes out through a weak spot in the belly muscles. This creates a  bulge.  If your hernia is small and it does not hurt, you may not need treatment. If your hernia is large or it hurts, you may need surgery.  If you will be having surgery, watch your hernia for changes in shape, size, or color. Tell your doctor about any changes. This information is not intended to replace advice given to you by your health care provider. Make sure you discuss any questions you have with your health care provider. Document Revised: 06/20/2018 Document Reviewed: 11/29/2016 Elsevier Patient Education  2020 [employer].

## 2019-11-04 LAB — CBC WITH DIFFERENTIAL/PLATELET
Basophils Absolute: 0 10*3/uL (ref 0.0–0.2)
Basos: 1 %
EOS (ABSOLUTE): 0.2 10*3/uL (ref 0.0–0.4)
Eos: 5 %
Hematocrit: 42 % (ref 37.5–51.0)
Hemoglobin: 14.2 g/dL (ref 13.0–17.7)
Immature Grans (Abs): 0 10*3/uL (ref 0.0–0.1)
Immature Granulocytes: 0 %
Lymphocytes Absolute: 1.7 10*3/uL (ref 0.7–3.1)
Lymphs: 41 %
MCH: 31.4 pg (ref 26.6–33.0)
MCHC: 33.8 g/dL (ref 31.5–35.7)
MCV: 93 fL (ref 79–97)
Monocytes Absolute: 0.4 10*3/uL (ref 0.1–0.9)
Monocytes: 11 %
Neutrophils Absolute: 1.7 10*3/uL (ref 1.4–7.0)
Neutrophils: 42 %
Platelets: 266 10*3/uL (ref 150–450)
RBC: 4.52 x10E6/uL (ref 4.14–5.80)
RDW: 13.2 % (ref 11.6–15.4)
WBC: 4.1 10*3/uL (ref 3.4–10.8)

## 2019-11-04 LAB — TSH: TSH: 7.12 u[IU]/mL — ABNORMAL HIGH (ref 0.450–4.500)

## 2019-11-04 LAB — COMPREHENSIVE METABOLIC PANEL
ALT: 20 IU/L (ref 0–44)
AST: 25 IU/L (ref 0–40)
Albumin/Globulin Ratio: 1.7 (ref 1.2–2.2)
Albumin: 4.5 g/dL (ref 3.6–4.6)
Alkaline Phosphatase: 65 IU/L (ref 48–121)
BUN/Creatinine Ratio: 13 (ref 10–24)
BUN: 21 mg/dL (ref 8–27)
Bilirubin Total: 1 mg/dL (ref 0.0–1.2)
CO2: 22 mmol/L (ref 20–29)
Calcium: 9.5 mg/dL (ref 8.6–10.2)
Chloride: 105 mmol/L (ref 96–106)
Creatinine, Ser: 1.57 mg/dL — ABNORMAL HIGH (ref 0.76–1.27)
GFR calc Af Amer: 47 mL/min/{1.73_m2} — ABNORMAL LOW (ref >59)
GFR calc non Af Amer: 41 mL/min/{1.73_m2} — ABNORMAL LOW (ref >59)
Globulin, Total: 2.6 g/dL (ref 1.5–4.5)
Glucose: 95 mg/dL (ref 65–99)
Potassium: 4 mmol/L (ref 3.5–5.2)
Sodium: 141 mmol/L (ref 134–144)
Total Protein: 7.1 g/dL (ref 6.0–8.5)

## 2019-11-05 ENCOUNTER — Other Ambulatory Visit: Payer: Self-pay

## 2019-11-05 ENCOUNTER — Ambulatory Visit
Admission: RE | Admit: 2019-11-05 | Discharge: 2019-11-05 | Disposition: A | Discharge status: Home / Self Care | Payer: Medicaid Other | Source: Ambulatory Visit | Attending: Nurse Practitioner | Admitting: Nurse Practitioner

## 2019-11-05 ENCOUNTER — Ambulatory Visit
Admission: RE | Admit: 2019-11-05 | Discharge: 2019-11-05 | Disposition: A | Discharge status: Home / Self Care | Payer: Medicaid Other | Attending: Nurse Practitioner | Admitting: Nurse Practitioner

## 2019-11-05 ENCOUNTER — Other Ambulatory Visit: Payer: Self-pay | Admitting: Physician Assistant

## 2019-11-05 DIAGNOSIS — R0602 Shortness of breath: Secondary | ICD-10-CM | POA: Diagnosis not present

## 2019-11-06 NOTE — Progress Notes (Signed)
Please instruct patient to take 2 levothyroxine 25 mcg (50 mcg total) daily on an empty stomach.  Will recheck levels at upcoming appointment.  Thank you!

## 2019-11-12 ENCOUNTER — Ambulatory Visit: Payer: Self-pay | Admitting: Surgery

## 2019-11-21 ENCOUNTER — Ambulatory Visit: Payer: Medicaid Other | Admitting: Surgery

## 2019-11-21 ENCOUNTER — Telehealth: Payer: Self-pay | Admitting: Internal Medicine

## 2019-11-21 ENCOUNTER — Other Ambulatory Visit: Payer: Self-pay

## 2019-11-21 ENCOUNTER — Encounter: Payer: Self-pay | Admitting: Surgery

## 2019-11-21 DIAGNOSIS — R1084 Generalized abdominal pain: Secondary | ICD-10-CM | POA: Diagnosis not present

## 2019-11-21 NOTE — Telephone Encounter (Signed)
   [facility] Pre-operative Risk Assessment    HEARTCARE STAFF: - Please ensure there is not already an duplicate clearance open for this procedure. - Under Visit Info/Reason for Call, type in Other and utilize the format Clearance MM/DD/YY or Clearance TBD. Do not use dashes or single digits. - If request is for dental extraction, please clarify the # of teeth to be extracted.  Request for surgical clearance:  1. What type of surgery is being performed? Umbilical and ventral hernia repair  2. When is this surgery scheduled? TBD  3. What type of clearance is required (medical clearance vs. Pharmacy clearance to hold med vs. Both)? pharm  4. Are there any medications that need to be held prior to surgery and how long? Advise on stopping Plavix 7 days prior  5. Practice name and name of physician performing surgery? [facility] Dr. Jacqulyn Bath Piscoya  6. What is the office phone number? [phone]   7.   What is the office fax number? [phone]  8.   Anesthesia type (None, local, MAC, general) ? general   Gerald Hodges 11/21/2019, 11:28 AM  _________________________________________________________________   (provider comments below)

## 2019-11-21 NOTE — Patient Instructions (Addendum)
We will get you scheduled for a CT scan prior to surgery.  le programaremos una tomografa computarizada. tendr que hacerse esto antes de la [place].  tendr Gerald Hodges cita para regresar despus de su tomografa computarizada.  Follow up here after your CT scan.  You are scheduled for a CT scan at [facility] on 11/27/19 at 9:30 am. You will need to arrive there by 9:15 am. You will need to have nothing to eat or drink for 4 hours prior. You will pick up your prep kit today.  su tomografa computarizada es el 16 de septiembre. llegars a las 9:15 am. es posible que no coma ni beba nada durante las 4 horas anteriores. esta tomografa computarizada se realizar en el centro de imgenes para pacientes ambulatorios de [place]    Umbilical Hernia, Adult  A hernia is a bulge of tissue that pushes through an opening between muscles. An umbilical hernia happens in the abdomen, near the belly button (umbilicus). The hernia may contain tissues from the small intestine, large intestine, or fatty tissue covering the intestines (omentum). Umbilical hernias in adults tend to get worse over time, and they require surgical treatment. There are several types of umbilical hernias. You may have:  A hernia located just above or below the umbilicus (indirect hernia). This is the most common type of umbilical hernia in adults.  A hernia that forms through an opening formed by the umbilicus (direct hernia).  A hernia that comes and goes (reducible hernia). A reducible hernia may be visible only when you strain, lift something heavy, or cough. This type of hernia can be pushed back into the abdomen (reduced).  A hernia that traps abdominal tissue inside the hernia (incarcerated hernia). This type of hernia cannot be reduced.  A hernia that cuts off blood flow to the tissues inside the hernia (strangulated hernia). The tissues can start to die if this happens. This type of hernia requires emergency  treatment. What are the causes? An umbilical hernia happens when tissue inside the abdomen presses on a weak area of the abdominal muscles. What increases the risk? You may have a greater risk of this condition if you:  Are obese.  Have had several pregnancies.  Have a buildup of fluid inside your abdomen (ascites).  Have had surgery that weakens the abdominal muscles. What are the signs or symptoms? The main symptom of this condition is a painless bulge at or near the belly button. A reducible hernia may be visible only when you strain, lift something heavy, or cough. Other symptoms may include:  Dull pain.  A feeling of pressure. Symptoms of a strangulated hernia may include:  Pain that gets increasingly worse.  Nausea and vomiting.  Pain when pressing on the hernia.  Skin over the hernia becoming red or purple.  Constipation.  Blood in the stool. How is this diagnosed? This condition may be diagnosed based on:  A physical exam. You may be asked to cough or strain while standing. These actions increase the pressure inside your abdomen and force the hernia through the opening in your muscles. Your health care provider may try to reduce the hernia by pressing on it.  Your symptoms and medical history. How is this treated? Surgery is the only treatment for an umbilical hernia. Surgery for a strangulated hernia is done as soon as possible. If you have a small hernia that is not incarcerated, you may need to lose weight before having surgery. Follow these instructions at home:  Lose weight, if told by your health care provider.  Do not try to push the hernia back in.  Watch your hernia for any changes in color or size. Tell your health care provider if any changes occur.  You may need to avoid activities that increase pressure on your hernia.  Do not lift anything that is heavier than 10 lb (4.5 kg) until your health care provider says that this is safe.  Take  over-the-counter and prescription medicines only as told by your health care provider.  Keep all follow-up visits as told by your health care provider. This is important. Contact a health care provider if:  Your hernia gets larger.  Your hernia becomes painful. Get help right away if:  You develop sudden, severe pain near the area of your hernia.  You have pain as well as nausea or vomiting.  You have pain and the skin over your hernia changes color.  You develop a fever. This information is not intended to replace advice given to you by your health care provider. Make sure you discuss any questions you have with your health care provider. Document Revised: 04/11/2017 Document Reviewed: 08/28/2016 Elsevier Patient Education  [place].

## 2019-11-23 ENCOUNTER — Encounter: Payer: Self-pay | Admitting: Surgery

## 2019-11-23 NOTE — Progress Notes (Signed)
11/23/2019  Reason for Visit:  Right lower quadrant pain and umbilical hernia  Referring Provider:  Noemi Chapel, NP  History of Present Illness: Gerald Hodges is a 84 y.o. male presenting for evaluation of right lower quadrant abdominal pain and umbilical hernia.  The patient reports recent travel to [place] a few months ago and he had an ultrasound which apparently revealed a hernia.  However, he did not bring the report with him today, so I'm unable to read it and see what the findings actually were.  The patient reports intermittent pain in the right lower quadrant.  Denies bulging sensation, and does not appear to be related to food intake.  This is also not necessarily related to physical activity, as the patient mentions that he may be sitting down and get the same pain.  The pain is not severe and does not last for long time.  Unfortunately, he is hard of hearing and also it is unclear how well he understands me, despite of speaking to him in [place].  He does mention that the frequency of the right lower quadrant discomfort is increasing.    He does mention feeling bulging sensation at the umbilicus and it is bulging most of the time.  He does not try to reduce it or push it in.  He has not had abdominal surgeries.  Denies any nausea or vomiting.  He does report reflux.  Denies any constipation or diarrhea.  Of note, he's on Plavix daily for history of CAD and TIA.  Past Medical History: Past Medical History:  Diagnosis Date  . Arthritis Left knee  . Chronic kidney disease    STAGE 3  . Coronary artery disease   . GERD (gastroesophageal reflux disease)   . Gout   . History of kidney problems   . Hyperlipemia 08/18/2014  . Hyperlipidemia   . Hypertension   . TIA (transient ischemic attack)   . Total knee replacement status 10/25/2016     Past Surgical History: Past Surgical History:  Procedure Laterality Date  . BUNIONECTOMY Bilateral   . EYE SURGERY Bilateral     Cataractt Extraction with IOL  . TOTAL KNEE ARTHROPLASTY Left 10/25/2016   Procedure: TOTAL KNEE ARTHROPLASTY;  Surgeon: Earnestine Leys, MD;  Location: [facility] ORS;  Service: Orthopedics;  Laterality: Left;    Home Medications: Prior to Admission medications   Medication Sig Start Date End Date Taking? Authorizing Provider  allopurinol (ZYLOPRIM) 100 MG tablet Take 1 tablet (100 mg total) by mouth daily. 11/03/19  Yes Noemi Chapel A, NP  amLODipine (NORVASC) 10 MG tablet Take 1 tablet (10 mg total) by mouth daily. 11/03/19  Yes Eulogio Bear, NP  atorvastatin (LIPITOR) 20 MG tablet Take 1 tablet (20 mg total) by mouth daily. 11/03/19  Yes Eulogio Bear, NP  carvedilol (COREG) 6.25 MG tablet Take 1 tablet (6.25 mg total) by mouth 2 (two) times daily with a meal. 04/11/19  Yes Dunn, Areta Haber, PA-C  clopidogrel (PLAVIX) 75 MG tablet TAKE 1 TABLET BY MOUTH EVERY DAY 11/05/19  Yes Dunn, Areta Haber, PA-C  colchicine 0.6 MG tablet Take 1 tablet (0.6 mg total) by mouth daily. 04/11/19  Yes Eulogio Bear, NP  furosemide (LASIX) 40 MG tablet Take 1-2 tablets daily as needed for swelling, weight gain, or shortness of breath. 09/18/18  Yes End, Harrell Gave, MD  isosorbide mononitrate (IMDUR) 60 MG 24 hr tablet Take 1.5 tablets (90 mg total) by mouth daily. 09/17/18  Yes  Rise Mu, PA-C  levothyroxine (SYNTHROID) 25 MCG tablet Take 1 tablet (25 mcg total) by mouth daily before breakfast. 11/03/19  Yes Noemi Chapel A, NP  losartan (COZAAR) 50 MG tablet Take 1 tablet (50 mg total) by mouth daily. 11/03/19  Yes Eulogio Bear, NP  nitroGLYCERIN (NITROSTAT) 0.4 MG SL tablet Place 1 tablet (0.4 mg total) under the tongue every 5 (five) minutes as needed for chest pain (chest pain). Maximum of 3 doses. 04/11/19  Yes Dunn, Areta Haber, PA-C  pantoprazole (PROTONIX) 40 MG tablet Take 1 tablet (40 mg total) by mouth daily. 11/03/19  Yes Eulogio Bear, NP    Allergies: Allergies  Allergen Reactions   . Aspirin Cough and Other (See Comments)  . Lisinopril Other (See Comments)    Throat itching Throat itching Throat itching    Social History:  reports that he has never smoked. He has never used smokeless tobacco. He reports that he does not drink alcohol and does not use drugs.   Family History: Family History  Problem Relation Age of Onset  . Cancer Mother        stomach  . Heart disease Neg Hx     Review of Systems: Review of Systems  Constitutional: Negative for chills and fever.  HENT: Positive for hearing loss.   Respiratory: Negative for shortness of breath.   Cardiovascular: Negative for chest pain.  Gastrointestinal: Positive for abdominal pain. Negative for constipation, diarrhea, nausea and vomiting.  Genitourinary: Negative for dysuria.  Musculoskeletal: Negative for myalgias.  Skin: Negative for rash.  Neurological: Negative for dizziness.  Psychiatric/Behavioral: Negative for depression.    Physical Exam BP (!) 173/92   Pulse 75   Temp 98.4 F (36.9 C)   Ht 5' 7" (1.702 m)   Wt 187 lb 3.2 oz (84.9 kg)   SpO2 100%   BMI 29.32 kg/m  CONSTITUTIONAL: No acute distress HEENT:  Normocephalic, atraumatic, extraocular motion intact. NECK: Trachea is midline, and there is no jugular venous distension.  RESPIRATORY:  Lungs are clear, and breath sounds are equal bilaterally. Normal respiratory effort without pathologic use of accessory muscles. CARDIOVASCULAR: Heart is regular without murmurs, gallops, or rubs. GI: The abdomen is soft, non-distended, with some discomfort to palpation in the right lower quadrant.  However, there is no palpable hernia defect or anything bulging in the area.  He does have a reducible 1-2 cm umbilical hernia and he also has a reducible left inguinal hernia.  No hernia on the right groin.  MUSCULOSKELETAL:  Normal muscle strength and tone in all four extremities.  No peripheral edema or cyanosis. SKIN: Skin turgor is normal. There  are no pathologic skin lesions.  NEUROLOGIC:  Motor and sensation is grossly normal.  Cranial nerves are grossly intact. PSYCH:  Alert and oriented to person, place and time. Affect is normal.  Laboratory Analysis: Labs 11/03/19: Na 141, K 4.0, Cl 105, CO2 22, BUN 21, Cr 1.57.  Total bili 1.0, AST 25, ALT 20, Alk phos 65.  WBC 4.1, Hgb 14.2, hct 42, Plt 266.  TSH 7.12  Imaging: No results found.  Assessment and Plan: This is a 84 y.o. male with right lower quadrant abdominal pain, with umbilical hernia and left inguinal hernia on exam.  --Discussed with the patient the potential for repairing the umbilical hernia and left inguinal hernia.  However, I am not able to palpable any issues in the right lower quadrant.  Since we do not have the ultrasound images  and he did not bring the report with him, and also given that he was not even aware of a left inguinal hernia, I think it's best to be thorough and obtain a CT scan of abdomen/pelvis to evaluate for all hernias he may have, as this will determine our surgical plan.  Once the CT scan is done, he will follow up with me in the office to discuss the results and talk about what the surgical approach would be.  Briefly discussed the possibility of robotic hernia repair. --In the meantime, will send clearance forms to his cardiologist and PCP to see if he is ok for surgery and if Plavix could be stopped for surgery as well.  Face-to-face time spent with the patient and care providers was 60 minutes, with more than 50% of the time spent counseling, educating, and coordinating care of the patient.     Melvyn Neth, [facility]

## 2019-11-25 ENCOUNTER — Telehealth: Payer: Self-pay | Admitting: *Deleted

## 2019-11-25 ENCOUNTER — Telehealth: Payer: Self-pay | Admitting: Emergency Medicine

## 2019-11-25 NOTE — Telephone Encounter (Signed)
Will send to requesting surgeons office via epic fax and remove from pre op pool.

## 2019-11-25 NOTE — Telephone Encounter (Signed)
Pt is scheduled to see Eula Listen, PA for clearance on 11/27/19 at 2pm.

## 2019-11-25 NOTE — Telephone Encounter (Signed)
Follow Up:    Pt's daughter is returning your call from this morning,

## 2019-11-25 NOTE — Progress Notes (Signed)
Cardiology Office Note    Date:  11/28/2019   ID:  Gerald Hodges, DOB [birth year], MRN [id number]  PCP:  Steele Sizer, MD  Cardiologist:  Yvonne Kendall, MD  Electrophysiologist:  None   Chief Complaint: Preoperative cardiac risk stratification   History of Present Illness:   Gerald Hodges is a 84 y.o. male with history of CAD based on abnormal myocardial perfusion stress test at [facility] with chronic stable angina, TIA, CKD stage III, HTN, HLD, and GERD who presents for preoperative cardiac risk stratification for umbilical and ventral hernia repair with Dr. Aleen Campi.   Prior Myoview in 10/2016 at [facility] was without significant ischemia with a small in size, subtle in severity, fixed mid inferolateral and basal defect consistent with possible artifact but could not rule out subtle scar, EF 56%, no significant coronary artery calcification. Study was low risk and unchanged when compared to prior in 09/2012. Echo from 11/2017 showed an EF of 55-60%, mild LVH, moderate focal basal hypertrophy, Gr1DD, mild aortic valve thickening with mild regurgitation, normal RV size and function. When he was seen in 11/2017, he noted some left leg pain/paresthesias and underwent lower extremity ABIs in 11/2017 that showed normal ABIs bilaterally.  He was seen in 02/2018 and continued to note exertional dyspnea that had been stable for more than a year.  He was seen in the office in 09/2018 and was doing reasonably well.  He continued to note unchanged intermittent chest discomfort with stable dyspnea.  Given his stable dyspnea/angina, underlying CKD, and in the setting of nonischemic Myoview that was unchanged when compared to prior, continued medical management was recommended.  His Imdur was titrated to 90 mg daily.  He was evaluated in 10/2018 at the [facility] ED after having been diagnosed with COVID-19 pneumonia 1.5 weeks prior in the setting of ongoing symptoms.  Vitals were stable and supportive care  was recommended.  He was last seen in the office in 03/2019 and was doing well from a cardiac perspective.  He noted 1 episode of chest pain approximately 1 month prior that improved with sublingual nitroglycerin x1 with symptoms lasting approximately 2 minutes.  Continued medical therapy was recommended.  He has been evaluated by surgery for hernia with plans for possible surgical repair of umbilical and ventral hernias with date to be determined.  He comes in doing reasonably well from a cardiac perspective.  Since he was last seen, he has had 1 episode of chest pain that was described as tightness on 11/16/2019 with associated generalized fatigue and sweating.  He took sublingual nitroglycerin x1 without improvement in symptoms.  Symptoms lasted for 3 to 5 minutes followed by spontaneous resolution.  He does note he had taken "4 pills" leading up to this and does wonder if his blood pressure was low however he does not recall call what pills he actually took that morning.  Since then, he has been chest pain-free.  He continues to live an active lifestyle without exertional symptoms.  He is tolerating his medications without issues.   Revised Cardiac Index: High risk risk for noncardiac surgery Duke Activity Status Index: > 4 METs  Labs independently reviewed: 10/2019 - Hgb 14.2, PLT 226, TSH elevated at 7.120 (improved from 7 months prior with value of 18.5), BUN 21, serum creatinine 1.57, potassium 4.0, albumin 4.5, AST/ALT normal 03/2019 - A1c 5.7, TC 114, TG 126, HDL 42, LDL 47, magnesium 1.7   Past Medical History:  Diagnosis Date  . Arthritis  Left knee  . Chronic kidney disease    STAGE 3  . Coronary artery disease   . GERD (gastroesophageal reflux disease)   . Gout   . History of kidney problems   . Hyperlipemia 08/18/2014  . Hyperlipidemia   . Hypertension   . TIA (transient ischemic attack)   . Total knee replacement status 10/25/2016    Past Surgical History:  Procedure  Laterality Date  . BUNIONECTOMY Bilateral   . EYE SURGERY Bilateral    Cataractt Extraction with IOL  . TOTAL KNEE ARTHROPLASTY Left 10/25/2016   Procedure: TOTAL KNEE ARTHROPLASTY;  Surgeon: Deeann Saint, MD;  Location: [facility] ORS;  Service: Orthopedics;  Laterality: Left;    Current Medications: Current Meds  Medication Sig  . allopurinol (ZYLOPRIM) 100 MG tablet Take 1 tablet (100 mg total) by mouth daily.  Marland Kitchen amLODipine (NORVASC) 10 MG tablet Take 1 tablet (10 mg total) by mouth daily.  Marland Kitchen atorvastatin (LIPITOR) 20 MG tablet Take 1 tablet (20 mg total) by mouth daily.  . carvedilol (COREG) 6.25 MG tablet Take 1 tablet (6.25 mg total) by mouth 2 (two) times daily with a meal.  . clopidogrel (PLAVIX) 75 MG tablet TAKE 1 TABLET BY MOUTH EVERY DAY  . furosemide (LASIX) 40 MG tablet Take 40 mg by mouth. Takes 1-2 tablets daily as needed for swelling, weight gain, shortness of breath.  . isosorbide mononitrate (IMDUR) 60 MG 24 hr tablet Take 1.5 tablets (90 mg total) by mouth daily.  Marland Kitchen levothyroxine (SYNTHROID) 25 MCG tablet Take 1 tablet (25 mcg total) by mouth daily before breakfast.  . nitroGLYCERIN (NITROSTAT) 0.4 MG SL tablet Place 1 tablet (0.4 mg total) under the tongue every 5 (five) minutes as needed for chest pain (chest pain). Maximum of 3 doses.  . pantoprazole (PROTONIX) 40 MG tablet Take 1 tablet (40 mg total) by mouth daily.   Current Facility-Administered Medications for the 11/28/19 encounter (Office Visit) with Sondra Barges, PA-C  Medication  . triamcinolone acetonide (KENALOG) 10 MG/ML injection 10 mg  . triamcinolone acetonide (KENALOG) 10 MG/ML injection 10 mg    Allergies:   Aspirin and Lisinopril   Social History   Socioeconomic History  . Marital status: Married    Spouse name: Not on file  . Number of children: Not on file  . Years of education: Not on file  . Highest education level: Not on file  Occupational History  . Not on file  Tobacco Use  . Smoking  status: Never Smoker  . Smokeless tobacco: Never Used  Vaping Use  . Vaping Use: Never used  Substance and Sexual Activity  . Alcohol use: No  . Drug use: No  . Sexual activity: Not Currently  Other Topics Concern  . Not on file  Social History Narrative  . Not on file   Social Determinants of Health   Financial Resource Strain:   . Difficulty of Paying Living Expenses: Not on file  Food Insecurity:   . Worried About Programme researcher, broadcasting/film/video in the Last Year: Not on file  . Ran Out of Food in the Last Year: Not on file  Transportation Needs:   . Lack of Transportation (Medical): Not on file  . Lack of Transportation (Non-Medical): Not on file  Physical Activity:   . Days of Exercise per Week: Not on file  . Minutes of Exercise per Session: Not on file  Stress:   . Feeling of Stress : Not on file  Social  Connections:   . Frequency of Communication with Friends and Family: Not on file  . Frequency of Social Gatherings with Friends and Family: Not on file  . Attends Religious Services: Not on file  . Active Member of Clubs or Organizations: Not on file  . Attends Banker Meetings: Not on file  . Marital Status: Not on file     Family History:  The patient's family history includes Cancer in his mother. There is no history of Heart disease.  ROS:   Review of Systems  Constitutional: Positive for diaphoresis and malaise/fatigue. Negative for chills, fever and weight loss.  HENT: Negative for congestion.   Eyes: Negative for discharge and redness.  Respiratory: Negative for cough, sputum production, shortness of breath and wheezing.   Cardiovascular: Positive for chest pain. Negative for palpitations, orthopnea, claudication, leg swelling and PND.  Gastrointestinal: Negative for abdominal pain, heartburn, nausea and vomiting.  Musculoskeletal: Negative for falls and myalgias.  Skin: Negative for rash.  Neurological: Negative for dizziness, tingling, tremors,  sensory change, speech change, focal weakness, loss of consciousness and weakness.  Endo/Heme/Allergies: Does not bruise/bleed easily.  Psychiatric/Behavioral: Negative for substance abuse. The patient is not nervous/anxious.   All other systems reviewed and are negative.    EKGs/Labs/Other Studies Reviewed:    Studies reviewed were summarized above. The additional studies were reviewed today:  2D echo 11/2017: - Left ventricle: The cavity size was normal. Wall thickness was increased in a pattern of mild LVH. There was moderate focal basal hypertrophy. Systolic function was normal. The estimated ejection fraction was in the range of 55% to 60%. Wall motion was normal; there were no regional wall motion abnormalities. Doppler parameters are consistent with abnormal left ventricular relaxation (grade 1 diastolic dysfunction). - Aortic valve: Trileaflet; mildly thickened leaflets. There was mild regurgitation. - Right ventricle: The cavity size was normal. Systolic function was normal.   EKG:  EKG is ordered today.  The EKG ordered today demonstrates NSR, 75 bpm, first-degree AV block (stable), cannot exclude prior inferior infarct, no acute ST-T changes  Recent Labs: 04/11/2019: Magnesium 1.7 11/03/2019: ALT 20; BUN 21; Creatinine, Ser 1.57; Hemoglobin 14.2; Platelets 266; Potassium 4.0; Sodium 141; TSH 7.120  Recent Lipid Panel    Component Value Date/Time   CHOL 114 04/11/2019 0934   CHOL 106 10/04/2018 1642   TRIG 126 04/11/2019 0934   HDL 42 04/11/2019 0934   HDL 30 (L) 10/04/2018 1642   CHOLHDL 2.7 04/11/2019 0934   VLDL 25 04/11/2019 0934   LDLCALC 47 04/11/2019 0934   LDLCALC Comment (A) 10/04/2018 1642   LDLDIRECT UNABLETO PERFORMED DUE TO NORMAL TRIG 04/11/2019 0934    PHYSICAL EXAM:    VS:  BP 130/78 (BP Location: Left Arm, Patient Position: Sitting, Cuff Size: Normal)   Pulse 76   Wt 190 lb 2 oz (86.2 kg)   SpO2 96%   BMI 29.78 kg/m   BMI:  Body mass index is 29.78 kg/m.  Physical Exam Constitutional:      Appearance: He is well-developed.  HENT:     Head: Normocephalic and atraumatic.  Eyes:     General:        Right eye: No discharge.        Left eye: No discharge.  Neck:     Vascular: No JVD.  Cardiovascular:     Rate and Rhythm: Normal rate and regular rhythm.     Pulses: No midsystolic click and no opening snap.  Posterior tibial pulses are 2+ on the right side and 2+ on the left side.     Heart sounds: S1 normal and S2 normal. Heart sounds not distant. Murmur heard. High-pitched blowing decrescendo early diastolic murmur is present with a grade of 1/4 at the upper right sternal border radiating to the apex.  No friction rub.  Pulmonary:     Effort: Pulmonary effort is normal. No respiratory distress.     Breath sounds: Normal breath sounds. No decreased breath sounds, wheezing or rales.  Chest:     Chest wall: No tenderness.  Abdominal:     General: There is no distension.     Palpations: Abdomen is soft.     Tenderness: There is no abdominal tenderness.  Musculoskeletal:     Cervical back: Normal range of motion.  Skin:    General: Skin is warm and dry.     Nails: There is no clubbing.  Neurological:     Mental Status: He is alert and oriented to person, place, and time.  Psychiatric:        Speech: Speech normal.        Behavior: Behavior normal.        Thought Content: Thought content normal.        Judgment: Judgment normal.     Wt Readings from Last 3 Encounters:  11/28/19 190 lb 2 oz (86.2 kg)  11/21/19 187 lb 3.2 oz (84.9 kg)  11/03/19 185 lb (83.9 kg)     ASSESSMENT & PLAN:   1. Preoperative cardiac risk stratification: Overall, he is doing reasonably well from a cardiac perspective with chronic angina.  He did have 1 episode of chest pain on 11/16/2019 that did feel somewhat different than his prior episodes and was not sublingual nitroglycerin responsive with symptoms lasting  for 3 to 5 minutes.  There was some associated diaphoresis and generalized fatigue.  Since then, he has been symptom-free.  Per Revised Cardiac Index he is high risk for noncardiac surgery.  He is able to achieve greater than 4 METS per Duke Activity Status Index without cardiac limitation.  Given recent symptoms and cardiac risk we will proceed with a Lexiscan MPI in the preoperative timeframe to evaluate for high risk ischemia.  Given murmur noted on exam with prior echo demonstrating mild aortic insufficiency we will update his echo as well.  Further risk ratification pending these results.  Recommendations regarding his Plavix in the preoperative timeframe will be based on the above results.  2. CAD involving the native coronary arteries with chronic angina: Currently chest pain-free.  Plan to pursue Lexiscan MPI as outlined above to evaluate for high risk ischemia in the preoperative setting.  Continue current medical therapy including amlodipine, atorvastatin, carvedilol, clopidogrel, and isosorbide mononitrate.  Patient has documented allergy to aspirin with cough noted.    3. Aortic valve insufficiency: Mild by most recent echo.  Schedule echo.  4. PVCs: No PVCs is noted on twelve-lead EKG today.  Asymptomatic.  Continue carvedilol.  First-degree AV block is stable.  5. HTN: Blood pressure reasonably controlled in the office today.  Continue current medical therapy including amlodipine, carvedilol, and Imdur.  6. HLD: LDL 47 from 03/2019 with normal LFT from 10/2019.  He remains on atorvastatin.  7. CKD stage III: Stable on most recent check.    8. History of COVID-19 infection: No residual deficits.  Update echo as outlined above.  9. Abnormal TSH: Improving.  Managed by PCP.  10. Language barrier:  Clinical interpreter via phone service was utilized for this visit.  Disposition: F/u with Dr. Okey Dupre or an APP in 3 months.   Medication Adjustments/Labs and Tests Ordered: Current medicines  are reviewed at length with the patient today.  Concerns regarding medicines are outlined above. Medication changes, Labs and Tests ordered today are summarized above and listed in the Patient Instructions accessible in Encounters.   Signed, Eula Listen, PA-C 11/28/2019 9:34 AM     [facility] -  [address] [place], [place] [address] [phone]

## 2019-11-25 NOTE — Progress Notes (Deleted)
Cardiology Office Note    Date:  11/25/2019   ID:  Aahil Fredin, DOB 18-Nov-1935, MRN [id number]  PCP:  Steele Sizer, MD  Cardiologist:  Yvonne Kendall, MD  Electrophysiologist:  None   Chief Complaint: Preoperative cardiac risk stratification  History of Present Illness:   Arick Mareno is a 84 y.o. male with history of CAD based on abnormal myocardial perfusion stress test at [facility] with chronic stable angina, TIA, CKD stage III, HTN, HLD, and GERD who presents for preoperative cardiac risk stratification for umbilical and ventral hernia repair with Dr. Aleen Campi.   Prior Myoview in 10/2016 at [facility] was without significant ischemia with a small in size, subtle in severity, fixed mid inferolateral and basal defect consistent with possible artifact but could not rule out subtle scar, EF 56%, no significant coronary artery calcification. Study was low risk and unchanged when compared to prior in 09/2012. Echo from 11/2017 showed an EF of 55-60%, mild LVH, moderate focal basal hypertrophy, Gr1DD, mild aortic valve thickening with mild regurgitation, normal RV size and function. When he was seen in 11/2017, he noted some left leg pain/paresthesias and underwent lower extremity ABIs in 11/2017 that showed borderline abnormal right toe brachial index with normal ABIs.  He was seen in 02/2018 and continued to note exertional dyspnea that had been stable for more than a year.  He was seen in the office in 09/2018 and was doing reasonably well.  He continued to note unchanged intermittent chest discomfort with stable dyspnea.  Given his stable dyspnea/angina, underlying CKD, and in the setting of nonischemic Myoview that was unchanged when compared to prior continue medical management was recommended.  His Imdur was titrated to 90 mg daily.  He was evaluated in 10/2018 at the [facility] ED after having been diagnosed with COVID-19 pneumonia 1.5 weeks prior in the setting of ongoing symptoms.  Vitals  were stable and supportive care was recommended.  He was last seen in the office in 03/2019 and was doing well from a cardiac perspective.  He noted 1 episode of chest pain approximately 1 month prior that improved with sublingual nitroglycerin x1 with symptoms lasting approximately 2 minutes.  Continued medical therapy was recommended.  He has been evaluated by surgery for hernia with plans for possible surgical repair of umbilical and ventral hernias with date to be determined.  ***   Revised Cardiac Index: *** risk for noncardiac surgery Duke Activity Status Index: *** METs  Labs independently reviewed: 10/2019 - Hgb 14.2, PLT 226, TSH elevated at 7.120 (improved from 7 months prior with value of 18.5), BUN 21, serum creatinine 1.57, potassium 4.0, albumin 4.5, AST/ALT normal 03/2019 - A1c 5.7, TC 114, TG 126, HDL 42, LDL 47, magnesium 1.7  Past Medical History:  Diagnosis Date  . Arthritis Left knee  . Chronic kidney disease    STAGE 3  . Coronary artery disease   . GERD (gastroesophageal reflux disease)   . Gout   . History of kidney problems   . Hyperlipemia 08/18/2014  . Hyperlipidemia   . Hypertension   . TIA (transient ischemic attack)   . Total knee replacement status 10/25/2016    Past Surgical History:  Procedure Laterality Date  . BUNIONECTOMY Bilateral   . EYE SURGERY Bilateral    Cataractt Extraction with IOL  . TOTAL KNEE ARTHROPLASTY Left 10/25/2016   Procedure: TOTAL KNEE ARTHROPLASTY;  Surgeon: Deeann Saint, MD;  Location: [facility] ORS;  Service: Orthopedics;  Laterality: Left;  Current Medications: No outpatient medications have been marked as taking for the 11/27/19 encounter (Appointment) with Sondra Barges, PA-C.   Current Facility-Administered Medications for the 11/27/19 encounter (Appointment) with Sondra Barges, PA-C  Medication  . triamcinolone acetonide (KENALOG) 10 MG/ML injection 10 mg  . triamcinolone acetonide (KENALOG) 10 MG/ML injection 10 mg     Allergies:   Aspirin and Lisinopril   Social History   Socioeconomic History  . Marital status: Married    Spouse name: Not on file  . Number of children: Not on file  . Years of education: Not on file  . Highest education level: Not on file  Occupational History  . Not on file  Tobacco Use  . Smoking status: Never Smoker  . Smokeless tobacco: Never Used  Vaping Use  . Vaping Use: Never used  Substance and Sexual Activity  . Alcohol use: No  . Drug use: No  . Sexual activity: Not Currently  Other Topics Concern  . Not on file  Social History Narrative  . Not on file   Social Determinants of Health   Financial Resource Strain:   . Difficulty of Paying Living Expenses: Not on file  Food Insecurity:   . Worried About Programme researcher, broadcasting/film/video in the Last Year: Not on file  . Ran Out of Food in the Last Year: Not on file  Transportation Needs:   . Lack of Transportation (Medical): Not on file  . Lack of Transportation (Non-Medical): Not on file  Physical Activity:   . Days of Exercise per Week: Not on file  . Minutes of Exercise per Session: Not on file  Stress:   . Feeling of Stress : Not on file  Social Connections:   . Frequency of Communication with Friends and Family: Not on file  . Frequency of Social Gatherings with Friends and Family: Not on file  . Attends Religious Services: Not on file  . Active Member of Clubs or Organizations: Not on file  . Attends Banker Meetings: Not on file  . Marital Status: Not on file     Family History:  The patient's family history includes Cancer in his mother. There is no history of Heart disease.  ROS:   ROS   EKGs/Labs/Other Studies Reviewed:    Studies reviewed were summarized above. The additional studies were reviewed today:  2D echo 11/2017: - Left ventricle: The cavity size was normal. Wall thickness was increased in a pattern of mild LVH. There was moderate focal basal hypertrophy. Systolic  function was normal. The estimated ejection fraction was in the range of 55% to 60%. Wall motion was normal; there were no regional wall motion abnormalities. Doppler parameters are consistent with abnormal left ventricular relaxation (grade 1 diastolic dysfunction). - Aortic valve: Trileaflet; mildly thickened leaflets. There was mild regurgitation. - Right ventricle: The cavity size was normal. Systolic function was normal.   EKG:  EKG is ordered today.  The EKG ordered today demonstrates ***  Recent Labs: 04/11/2019: Magnesium 1.7 11/03/2019: ALT 20; BUN 21; Creatinine, Ser 1.57; Hemoglobin 14.2; Platelets 266; Potassium 4.0; Sodium 141; TSH 7.120  Recent Lipid Panel    Component Value Date/Time   CHOL 114 04/11/2019 0934   CHOL 106 10/04/2018 1642   TRIG 126 04/11/2019 0934   HDL 42 04/11/2019 0934   HDL 30 (L) 10/04/2018 1642   CHOLHDL 2.7 04/11/2019 0934   VLDL 25 04/11/2019 0934   LDLCALC 47 04/11/2019 0934   LDLCALC  Comment (A) 10/04/2018 1642   LDLDIRECT UNABLETO PERFORMED DUE TO NORMAL TRIG 04/11/2019 0934    PHYSICAL EXAM:    VS:  There were no vitals taken for this visit.  BMI: There is no height or weight on file to calculate BMI.  Physical Exam  Wt Readings from Last 3 Encounters:  11/21/19 187 lb 3.2 oz (84.9 kg)  11/03/19 185 lb (83.9 kg)  04/11/19 188 lb (85.3 kg)     ASSESSMENT & PLAN:   1. ***  Disposition: F/u with Dr. Okey Dupre or an APP in ***.   Medication Adjustments/Labs and Tests Ordered: Current medicines are reviewed at length with the patient today.  Concerns regarding medicines are outlined above. Medication changes, Labs and Tests ordered today are summarized above and listed in the Patient Instructions accessible in Encounters.   Signed, Eula Listen, PA-C 11/25/2019 11:06 AM     [facility] - [place] [address] [place], [place] [address] [phone]

## 2019-11-25 NOTE — Telephone Encounter (Signed)
Patients son, Lyn Hollingshead (DPR on file) returned my call. I discussed the need for a pre op clearance appointment for the patient with him and he agreed with this plan. Scheduled him to see Eula Listen, PA in the [place] office were he is normally seen 11/27/19 at [facility].

## 2019-11-25 NOTE — Telephone Encounter (Signed)
   Primary Cardiologist:Christopher End, MD  Chart reviewed as part of pre-operative protocol coverage. Because of Gerald Hodges's past medical history and time since last visit, they will require a follow-up visit in order to better assess preoperative cardiovascular risk.  Pre-op covering staff: - Please schedule appointment and call patient to inform them. If patient already had an upcoming appointment within acceptable timeframe, please add "pre-op clearance" to the appointment notes so provider is aware. - Please contact requesting surgeon's office via preferred method (i.e, phone, fax) to inform them of need for appointment prior to surgery.  If applicable, this message will also be routed to pharmacy pool and/or primary cardiologist for input on holding anticoagulant/antiplatelet agent as requested below so that this information is available to the clearing provider at time of patient's appointment.   [name], [place]  11/25/2019, 9:50 AM

## 2019-11-25 NOTE — Telephone Encounter (Signed)
Unnecessary phone note opened. See documentation on phone note from 11/21/19.

## 2019-11-25 NOTE — Telephone Encounter (Signed)
Left msg on voicemail for patient to call the office. 

## 2019-11-27 ENCOUNTER — Ambulatory Visit
Admission: RE | Admit: 2019-11-27 | Discharge: 2019-11-27 | Disposition: A | Discharge status: Home / Self Care | Payer: Medicaid Other | Source: Ambulatory Visit | Attending: Surgery | Admitting: Surgery

## 2019-11-27 ENCOUNTER — Other Ambulatory Visit: Payer: Self-pay

## 2019-11-27 ENCOUNTER — Ambulatory Visit: Payer: Medicaid Other | Admitting: Physician Assistant

## 2019-11-27 DIAGNOSIS — N400 Enlarged prostate without lower urinary tract symptoms: Secondary | ICD-10-CM | POA: Diagnosis not present

## 2019-11-27 DIAGNOSIS — K4090 Unilateral inguinal hernia, without obstruction or gangrene, not specified as recurrent: Secondary | ICD-10-CM | POA: Diagnosis not present

## 2019-11-27 DIAGNOSIS — K429 Umbilical hernia without obstruction or gangrene: Secondary | ICD-10-CM | POA: Diagnosis not present

## 2019-11-27 DIAGNOSIS — R1084 Generalized abdominal pain: Secondary | ICD-10-CM | POA: Diagnosis not present

## 2019-11-27 DIAGNOSIS — K5730 Diverticulosis of large intestine without perforation or abscess without bleeding: Secondary | ICD-10-CM | POA: Diagnosis not present

## 2019-11-27 MED ORDER — IOHEXOL 300 MG/ML  SOLN
75.0000 mL | Freq: Once | INTRAMUSCULAR | Status: AC | PRN
  Administered 2019-11-27: 75 mL via INTRAVENOUS

## 2019-11-28 ENCOUNTER — Ambulatory Visit (INDEPENDENT_AMBULATORY_CARE_PROVIDER_SITE_OTHER): Payer: Medicaid Other | Admitting: Physician Assistant

## 2019-11-28 ENCOUNTER — Encounter: Payer: Self-pay | Admitting: Physician Assistant

## 2019-11-28 VITALS — BP 130/78 | HR 76 | Wt 190.1 lb

## 2019-11-28 DIAGNOSIS — Z8616 Personal history of COVID-19: Secondary | ICD-10-CM

## 2019-11-28 DIAGNOSIS — I10 Essential (primary) hypertension: Secondary | ICD-10-CM

## 2019-11-28 DIAGNOSIS — N183 Chronic kidney disease, stage 3 unspecified: Secondary | ICD-10-CM | POA: Diagnosis not present

## 2019-11-28 DIAGNOSIS — E785 Hyperlipidemia, unspecified: Secondary | ICD-10-CM | POA: Diagnosis not present

## 2019-11-28 DIAGNOSIS — I351 Nonrheumatic aortic (valve) insufficiency: Secondary | ICD-10-CM | POA: Diagnosis not present

## 2019-11-28 DIAGNOSIS — I25118 Atherosclerotic heart disease of native coronary artery with other forms of angina pectoris: Secondary | ICD-10-CM

## 2019-11-28 DIAGNOSIS — R7989 Other specified abnormal findings of blood chemistry: Secondary | ICD-10-CM

## 2019-11-28 DIAGNOSIS — Z01810 Encounter for preprocedural cardiovascular examination: Secondary | ICD-10-CM | POA: Diagnosis not present

## 2019-11-28 DIAGNOSIS — I493 Ventricular premature depolarization: Secondary | ICD-10-CM | POA: Diagnosis not present

## 2019-11-28 DIAGNOSIS — Z789 Other specified health status: Secondary | ICD-10-CM

## 2019-11-28 NOTE — Progress Notes (Signed)
11/28/19  CT scan personally viewed and agree with the findings.  Patient has a small umbilical hernia (<2 cm) and a left inguinal hernia, both containing only fat.  There is no evidence of hernia on the right side.  Unclear the etiology of the patient's right sided discomfort.  Patient will follow up with me on 9/20 to discuss surgery and has appointment today with Cardiology for evaluation for surgery.  Surgery date would depend on clearance and any cardiac workup that's needed.  Henrene Dodge, MD

## 2019-11-28 NOTE — Patient Instructions (Addendum)
Medication Instructions:  - Your physician recommends that you continue on your current medications as directed. Please refer to the Current Medication list given to you today.  *If you need a refill on your cardiac medications before your next appointment, please call your pharmacy*   Lab Work: - none ordered  If you have labs (blood work) drawn today and your tests are completely normal, you will receive your results only by: Marland Kitchen MyChart Message (if you have MyChart) OR . A paper copy in the mail If you have any lab test that is abnormal or we need to change your treatment, we will call you to review the results.   Testing/Procedures: 1) Echocardiogram - Your physician has requested that you have an echocardiogram. Echocardiography is a painless test that uses sound waves to create images of your heart. It provides your doctor with information about the size and shape of your heart and how well your heart's chambers and valves are working. This procedure takes approximately one hour. There are no restrictions for this procedure. There is a possibility that an IV may need to be started during your test to inject an image enhancing agent. This is done to obtain more optimal pictures of your heart. Therefore we ask that you do at least drink some water prior to coming in to hydrate your veins.   Note: if you arrive more than 10 minutes past your appointment time, your test may be rescheduled    2) Lexiscan myoview: - Your physician has requested that you have a lexiscan myoview.  [facility] MYOVIEW  Your caregiver has ordered a Stress Test with nuclear imaging. The purpose of this test is to evaluate the blood supply to your heart muscle. This procedure is referred to as a "Non-Invasive Stress Test." This is because other than having an IV started in your vein, nothing is inserted or "invades" your body. Cardiac stress tests are done to find areas of poor blood flow to the heart by determining the  extent of coronary artery disease (CAD). Some patients exercise on a treadmill, which naturally increases the blood flow to your heart, while others who are  unable to walk on a treadmill due to physical limitations have a pharmacologic/chemical stress agent called Lexiscan . This medicine will mimic walking on a treadmill by temporarily increasing your coronary blood flow.   Please note: these test may take anywhere between 2-4 hours to complete  PLEASE REPORT TO [facility] MEDICAL MALL ENTRANCE  THE VOLUNTEERS AT THE FIRST DESK WILL DIRECT YOU WHERE TO GO  Date of Procedure:_____________________________________  Arrival Time for Procedure:______________________________  Instructions regarding medication:   __x__ : Hold lasix (furosemide) the morning of your test  __x__:  Hold coreg (carvedilol) the night before procedure and morning of your test  _x___:  You may take all of your other regular medications the morning of the test with enough water to get them down safely  PLEASE NOTIFY THE OFFICE AT LEAST 24 HOURS IN ADVANCE IF YOU ARE UNABLE TO KEEP YOUR APPOINTMENT.  [phone] AND  PLEASE NOTIFY NUCLEAR MEDICINE AT [facility] AT LEAST 24 HOURS IN ADVANCE IF YOU ARE UNABLE TO KEEP YOUR APPOINTMENT. [phone]  How to prepare for your Myoview test:  1. Do not eat or drink after midnight 2. No caffeine for 24 hours prior to test 3. No smoking 24 hours prior to test. 4. Your medication may be taken with water.  If your doctor stopped a medication because of this test,  do not take that medication. 5. Ladies, please do not wear dresses.  Skirts or pants are appropriate. Please wear a short sleeve shirt. 6. No perfume, cologne or lotion. 7. Wear comfortable walking shoes. No heels!       Follow-Up: At [facility], you and your health needs are our priority.  As part of our continuing mission to provide you with exceptional heart care, we have created designated Provider Care Teams.  These  Care Teams include your primary Cardiologist (physician) and Advanced Practice Providers (APPs -  Physician Assistants and Nurse Practitioners) who all work together to provide you with the care you need, when you need it.  We recommend signing up for the patient portal called "MyChart".  Sign up information is provided on this After Visit Summary.  MyChart is used to connect with patients for Virtual Visits (Telemedicine).  Patients are able to view lab/test results, encounter notes, upcoming appointments, etc.  Non-urgent messages can be sent to your provider as well.   To learn more about what you can do with MyChart, go to [email or web address].    Your next appointment:   3 month(s)  The format for your next appointment:   In Person  Provider:    You may see Yvonne Kendall, MD or one of the following Advanced Practice Providers on your designated Care Team:    Nicolasa Ducking, NP  Eula Listen, PA-C  Marisue Ivan, PA-C    Other Instructions   Ecocardiograma Echocardiogram Un ecocardiograma es un procedimiento que [place] ondas sonoras indoloras (ultrasonido) para obtener una imagen del corazn. Las imgenes de un ecocardiograma pueden proporcionar informacin importante sobre lo siguiente:  Signos de arteriopata coronaria (EAC).  Signos de aneurisma. Un aneurisma es una zona debilitada o daada de la pared de una arteria que se abulta por la fuerza normal del bombeo de la sangre a travs del cuerpo.  Tamao y forma del corazn. Los [employer] tamao y la forma del corazn se pueden asociar a determinadas afecciones, como insuficiencia cardaca, aneurisma y [place].  Funcin del msculo cardaco.  Funcin de la vlvula cardaca.  Signos de un infarto de miocardio previo.  Acumulacin de lquido alrededor del corazn.  Engrosamiento del msculo cardaco.  Un tumor o crecimiento infeccioso alrededor de las vlvulas cardacas. Informe al mdico acerca de lo  siguiente:  Cualquier alergia que tenga.  Todos los [employer] consume, incluidos vitaminas, hierbas, gotas oftlmicas, cremas y [address] de [address].  Cualquier enfermedad de la sangre que tenga.  Cirugas a las que se someti.  Cualquier enfermedad que tenga.  Si est embarazada o podra estarlo. Cules son los riesgos? En general, se trata de un procedimiento seguro. Sin embargo, pueden ocurrir complicaciones, por ejemplo:  Reaccin alrgica al tinte de contraste utilizado en el procedimiento. Qu ocurre antes del procedimiento? No se requiere Lobbyist. Podr comer y beber normalmente. Qu ocurre durante el procedimiento?   Pueden colocarle un tubo (catter) intravenoso en una de las venas.  Puede recibir Arts development officer a travs de esta va. Un contraste es una inyeccin que mejora la calidad de las imgenes del corazn.  Le aplicarn un gel sobre el pecho.  Le pasarn un instrumento similar a una vara (transductor) sobre el pecho. El gel ayudar a transmitir las [employer] del transductor.  Las ondas sonoras rebotarn inofensivamente en el corazn para permitir capturar las imgenes del corazn en movimiento, en tiempo real. Las imgenes se registrarn en una computadora. Este procedimiento puede  variar segn el mdico y el hospital. Ladell Heads sucede despus del procedimiento?  Puede retomar su rutina diaria normal, incluidos la dieta, las actividades y [employer], a menos que su mdico le indique lo contrario. Resumen  Un ecocardiograma es un procedimiento que [place] ondas sonoras indoloras (ultrasonido) para obtener una imagen del corazn.  Las imgenes de un ecocardiograma pueden brindar informacin importante sobre el tamao y la forma del corazn, la funcin del msculo cardaco, la funcin de la vlvula cardaca y la acumulacin de lquido alrededor del Programmer, multimedia.  No es necesario prepararse para este procedimiento. Podr comer y beber  normalmente.  Al finalizar el ecocardiograma, puede retomar su rutina diaria normal, a menos que su mdico le indique lo contrario. Esta informacin no tiene Theme park manager el consejo del mdico. Asegrese de hacerle al mdico cualquier pregunta que tenga. Document Revised: 12/07/2016 Document Reviewed: 06/23/2016 Elsevier Patient Education  2020 Elsevier Inc.     [place] cardaca Cardiac Nuclear Scan Neomia Dear gammagrafa cardaca es una prueba que mide el flujo de sangre al corazn cuando una persona est en reposo y cuando ejercita. La prueba puede detectar si:  No llega suficiente sangre a una regin del corazn.  El msculo del corazn no funciona normalmente. Puede ser [employer] le hagan esta prueba si:  Shelle Iron enfermedad cardaca.  Ha tenido resultados de laboratorio anormales.  Ha tenido una ciruga cardaca o un procedimiento de baln para abrir las arterias obstruidas (angioplastia).  Siente dolor en el pecho.  Le falta el aire. Para este estudio, se inyecta un tinte radiactivo Radiographer, therapeutic) en el torrente sanguneo. Despus de que el marcador se haya dirigido al corazn, se [place] un dispositivo de obtencin de imgenes para medir la cantidad de Engineer, manufacturing systems que es absorbida por las diferentes zonas del corazn o que se distribuye en ellas. Griffin Dakin, este procedimiento se realiza en un hospital y [place] y Nurse, learning disability. Informe al mdico acerca de lo siguiente:  Cualquier alergia que tenga.  Todos los [employer], incluidos vitaminas, hierbas, gotas oftlmicas, cremas y [address] de [address].  Cualquier problema previo que usted o algn miembro de su familia haya tenido con los anestsicos.  Cualquier trastorno de la sangre que tenga.  Cirugas a las que se haya sometido.  Cualquier afeccin mdica que tenga.  Si est embarazada o podra estarlo. Cules son los riesgos? En general, se trata de un procedimiento seguro. Sin embargo,  pueden ocurrir complicaciones, por ejemplo:  Dolor intenso en el pecho e infarto de miocardio. Esto es solo un riesgo si se realiza la parte de la prueba de esfuerzo del procedimiento.  Latidos cardacos rpidos.  Sensacin de Paediatric nurse. Esta suele desaparecer rpidamente.  Una reaccin alrgica al [employer]. Qu ocurre antes del procedimiento?  Consulte a su mdico si debe cambiar o suspender los medicamentos que toma habitualmente. Esto es muy importante si toma medicamentos para la diabetes o anticoagulantes.  Siga las indicaciones del mdico respecto de las restricciones en las comidas o las bebidas.  Qutese las [employer] del procedimiento. Qu ocurre durante el procedimiento?  Le colocarn una va intravenosa en una vena.  El mdico le inyectar una pequea cantidad de Engineer, manufacturing systems radioactivo a travs de la va i.v.  Usted esperar durante 20 a mientras el marcador viaja por el torrente sanguneo.  Se supervisar la actividad del corazn con un electrocardiograma (ECG).  Se recostar en una camilla.  Se tomarn imgenes del corazn durante un lapso  de 15 a .  Tambin pueden hacerle Writer. Para esta prueba, se podr hacer una de las siguientes opciones: ? Deber ejercitarse sobre una cinta caminadora o una bicicleta fija. Mientras hace ejercicio, se controlar la actividad del corazn con un ECG, y se le tomar la presin arterial. ? Le administrarn medicamentos que aumentarn el flujo de sangre a partes del corazn. Esto se realiza si no puede hacer ejercicio.  Cuando el corazn reciba el flujo mximo de [place], se volver a Clinical research associate a travs de la va i.v.  Despus de entre 20 y , regresar a la camilla y se tomarn ms imgenes del Programmer, multimedia.  Dependiendo del tipo de marcador Meckling, puede ser necesario repetir el procedimiento entre 3 y 4 horas ms tarde.  Cuando el procedimiento haya terminado, se  retirar la va i.v. El procedimiento puede variar segn el mdico y el hospital. Ladell Heads ocurre despus del procedimiento?  Puede retomar su rutina normal, incluidos la dieta, las actividades y [employer], a menos que el mdico le indique lo contrario.  A menos que su mdico le indique lo contrario, puede aumentar el consumo de lquidos. Esto ayudar a eliminar el tinte de contraste del cuerpo. Beba suficiente lquido como para Pharmacologist la orina de color amarillo plido.  Consulte a su mdico o pregunte en el departamento donde se realiza el anlisis acerca de lo siguiente: ? Cundo estarn disponibles mis resultados? ? Cmo obtendr mis resultados? Resumen  Una gammagrafa cardaca mide el flujo de sangre al corazn cuando una persona est en reposo y cuando ejercita.  Infrmele al mdico si est embarazada.  Antes del procedimiento, hable con el mdico acerca de cambiar o suspender los medicamentos que toma habitualmente. Esto es muy importante si toma medicamentos para la diabetes o anticoagulantes.  Despus del procedimiento, a menos que su mdico le indique lo contrario, aumente el consumo de lquidos. Esto ayudar a eliminar el tinte de contraste del cuerpo.  Despus del procedimiento, puede retomar su rutina normal, incluidos la dieta, las actividades y [employer], a menos que el mdico le indique lo contrario. Esta informacin no tiene Theme park manager el consejo del mdico. Asegrese de hacerle al mdico cualquier pregunta que tenga. Document Revised: 10/08/2017 Document Reviewed: 10/08/2017 Elsevier Patient Education  2020 [employer].

## 2019-12-01 ENCOUNTER — Other Ambulatory Visit: Payer: Self-pay

## 2019-12-01 ENCOUNTER — Telehealth: Payer: Self-pay | Admitting: Emergency Medicine

## 2019-12-01 ENCOUNTER — Encounter: Payer: Self-pay | Admitting: Surgery

## 2019-12-01 ENCOUNTER — Ambulatory Visit (INDEPENDENT_AMBULATORY_CARE_PROVIDER_SITE_OTHER): Payer: Medicaid Other | Admitting: Surgery

## 2019-12-01 VITALS — BP 159/87 | HR 71 | Temp 97.6°F | Resp 12 | Wt 191.8 lb

## 2019-12-01 DIAGNOSIS — K429 Umbilical hernia without obstruction or gangrene: Secondary | ICD-10-CM

## 2019-12-01 DIAGNOSIS — M6208 Separation of muscle (nontraumatic), other site: Secondary | ICD-10-CM

## 2019-12-01 DIAGNOSIS — K4090 Unilateral inguinal hernia, without obstruction or gangrene, not specified as recurrent: Secondary | ICD-10-CM | POA: Diagnosis not present

## 2019-12-01 NOTE — Telephone Encounter (Signed)
Medical Clearance sent to Cathlean Marseilles, NP.  [phone]

## 2019-12-01 NOTE — Progress Notes (Signed)
12/01/2019  History of Present Illness: Gerald Hodges is a 84 y.o. male presenting for follow-up of right-sided abdominal pain as well as left inguinal hernia and umbilical hernia.  The patient was last seen in the office on 11/21/2019.  CT scan of the abdomen pelvis was obtained to evaluate the patient's anatomy and check exactly what hernias he had.  CT scan was done on 11/27/2019 which showed a left inguinal hernia as well as an umbilical hernia but there was no findings on the right side.  Patient reports that he still has some discomfort both on the right side and the left side around the umbilicus.  He saw [name] with cardiology on 11/28/2019 and he has been set up for a stress test as well as echocardiogram.  Past Medical History: Past Medical History:  Diagnosis Date  . Arthritis Left knee  . Chronic kidney disease    STAGE 3  . Coronary artery disease   . GERD (gastroesophageal reflux disease)   . Gout   . History of kidney problems   . Hyperlipemia 08/18/2014  . Hyperlipidemia   . Hypertension   . TIA (transient ischemic attack)   . Total knee replacement status 10/25/2016     Past Surgical History: Past Surgical History:  Procedure Laterality Date  . BUNIONECTOMY Bilateral   . EYE SURGERY Bilateral    Cataractt Extraction with IOL  . TOTAL KNEE ARTHROPLASTY Left 10/25/2016   Procedure: TOTAL KNEE ARTHROPLASTY;  Surgeon: Deeann Saint, MD;  Location: [facility] ORS;  Service: Orthopedics;  Laterality: Left;    Home Medications: Prior to Admission medications   Medication Sig Start Date End Date Taking? Authorizing Provider  allopurinol (ZYLOPRIM) 100 MG tablet Take 1 tablet (100 mg total) by mouth daily. 11/03/19  Yes Cathlean Marseilles A, NP  amLODipine (NORVASC) 10 MG tablet Take 1 tablet (10 mg total) by mouth daily. 11/03/19  Yes Valentino Nose, NP  atorvastatin (LIPITOR) 20 MG tablet Take 1 tablet (20 mg total) by mouth daily. 11/03/19  Yes Valentino Nose,  NP  carvedilol (COREG) 6.25 MG tablet Take 1 tablet (6.25 mg total) by mouth 2 (two) times daily with a meal. 04/11/19  Yes Dunn, Raymon Mutton, PA-C  clopidogrel (PLAVIX) 75 MG tablet TAKE 1 TABLET BY MOUTH EVERY DAY 11/05/19  Yes Dunn, Raymon Mutton, PA-C  furosemide (LASIX) 40 MG tablet Take 40 mg by mouth. Takes 1-2 tablets daily as needed for swelling, weight gain, shortness of breath.   Yes [provider]  isosorbide mononitrate (IMDUR) 60 MG 24 hr tablet Take 1.5 tablets (90 mg total) by mouth daily. 09/17/18  Yes Dunn, Raymon Mutton, PA-C  levothyroxine (SYNTHROID) 25 MCG tablet Take 1 tablet (25 mcg total) by mouth daily before breakfast. 11/03/19  Yes Cathlean Marseilles A, NP  nitroGLYCERIN (NITROSTAT) 0.4 MG SL tablet Place 1 tablet (0.4 mg total) under the tongue every 5 (five) minutes as needed for chest pain (chest pain). Maximum of 3 doses. 04/11/19  Yes Dunn, Raymon Mutton, PA-C  pantoprazole (PROTONIX) 40 MG tablet Take 1 tablet (40 mg total) by mouth daily. 11/03/19  Yes Valentino Nose, NP    Allergies: Allergies  Allergen Reactions  . Aspirin Cough and Other (See Comments)  . Lisinopril Other (See Comments)    Throat itching Throat itching Throat itching    Review of Systems: Review of Systems  Constitutional: Negative for chills and fever.  Respiratory: Negative for shortness of breath.   Cardiovascular: Negative  for chest pain.  Gastrointestinal: Positive for abdominal pain and constipation. Negative for diarrhea, nausea and vomiting.    Physical Exam BP (!) 159/87   Pulse 71   Temp 97.6 F (36.4 C)   Resp 12   Wt 191 lb 12.8 oz (87 kg)   SpO2 99%   BMI 30.04 kg/m  CONSTITUTIONAL: No acute distress HEENT:  Normocephalic, atraumatic, extraocular motion intact. RESPIRATORY: Normal respiratory effort without pathologic use of accessory muscles. CARDIOVASCULAR: Regular rhythm and rate GI: The abdomen is soft, nondistended, with some discomfort to palpation in the periumbilical  area both right and left sides.  Patient has a reducible left inguinal hernia and a reducible umbilical hernia.  He does have a small component of diastases.  NEUROLOGIC:  Motor and sensation is grossly normal.  Cranial nerves are grossly intact. PSYCH:  Alert and oriented to person, place and time. Affect is normal.  Labs/Imaging: CT abd/pelvis 11/27/19: IMPRESSION: 1. Fat containing left inguinal and umbilical hernias. No bowel herniation. 2. Enlarged prostate. 3. Asymmetric wall thickening along the left anterolateral aspect of the urinary bladder. Cystoscopy may be useful for further evaluation. 4. Bilateral renal hypodensities, the majority of which are compatible with simple cysts. Probable minimally complex hyperdense cyst right kidney as above. 5. Diverticulosis without diverticulitis.  Assessment and Plan: This is a 84 y.o. male with a left inguinal hernia.  Umbilical hernia.  -Discussed with patient that there were no findings seen on the right side.  Particularly there is no Spigelian hernia or any bulging on the right side.  A potential issue is that given constipation issues that he has, this could be causing some discomfort in the right abdomen.  However his pain seems to be more periumbilical radiating more towards the right and left sides which could be related to his umbilical hernia itself. -Discussed with patient that given the findings, our plan will be to do a robotic assisted left inguinal hernia repair and open umbilical hernia repair.  Discussed with him the procedure at length including the risks of bleeding, infection, and injury to surrounding structures, and he is willing to proceed.  At this point he still undergoing cardiology work-up given his prior cardiac history and some chest pain issues that he has had and there is a stress test and echocardiogram ordered.  My hope would be that we could tentatively schedule him for 12/23/2019 for surgery.  The patient  understands that this may need to be postponed depending on what his work-up is at the time.  Discussed with him that given his Plavix, we would want him to stop that medication for 7 days prior to surgery and we can proceed with surgery on 12/23/2019, then the last dose will be on 10/4. -Patient understands that he will also need a Covid test prior to surgery.  All this will be pending his cardiology work-up and he understands that the final date of surgery may need to be changed depending on that.  Face-to-face time spent with the patient and care providers was 25 minutes, with more than 50% of the time spent counseling, educating, and coordinating care of the patient.     Howie Ill, MD  [facility]

## 2019-12-01 NOTE — Patient Instructions (Addendum)
Our surgery schedule will contact you to schedule your surgery. Please have the BLUE SHEET available when she calls you.  We are awaiting your Cardiac Clearance from your Cardiologist, we will then know when you need to stop your Plavix. Their office should let you know if not please contact the office.  Call the office if you have any questions or concerns.   Nuestro horario de [place] se Audiological scientist con usted para Charity fundraiser su Leisure centre manager. Tenga la HOJA AZUL disponible cuando ella lo llame. Estamos esperando su autorizacin cardaca de su cardilogo, luego sabremos cundo debe detener su Plavix. Su oficina debe informarle si no, comunquese con la oficina. Llame a la oficina si tiene alguna pregunta o inquietud.  Hernia umbilical en los adultos Umbilical Hernia, Adult  Una hernia es un bulto de tejido que sobresale a travs de una abertura [employer]. Una hernia umbilical se produce en el abdomen, cerca del ombligo. La hernia puede contener tejidos del intestino delgado, el intestino grueso o tejido graso que recubre el intestino (epipln). Las hernias USAA en los adultos suelen empeorar con el tiempo y requieren tratamiento quirrgico. Hay varios tipos de hernias umbilicales. Es posible que tenga lo siguiente:  Una hernia ubicada justo por debajo o por arriba del ombligo (hernia indirecta). Es el tipo de hernia umbilical ms frecuente en los adultos.  Una hernia que se forma a travs de una abertura hecha por el ombligo (hernia directa).  Una hernia que aparece y desaparece (hernia reducible). Una hernia reducible puede ser visible solo al hacer fuerza, levantar objetos pesados o toser. Este tipo de hernia se puede reintroducir en el abdomen (reducir).  Una hernia que aprisiona tejido abdominal (hernia encarcelada). Este tipo de hernia es irreducible.  Una hernia que interrumpe el flujo de sangre a los tejidos en su interior (hernia estrangulada). Si esto ocurre, los tejidos Therapist, occupational a Furniture conservator/restorer. Este tipo de hernia requiere tratamiento de Associate Professor. Cules son las causas? Una hernia umbilical se produce cuando el tejido dentro del abdomen ejerce presin sobre una zona debilitada de los msculos abdominales. Qu incrementa el riesgo? Puede correr un mayor riesgo de Warehouse manager esta afeccin en los siguientes casos:  Tiene obesidad.  Tuvo varios embarazos.  Tiene una acumulacin de lquido dentro del abdomen (ascitis).  Se someti a una ciruga que [employer] abdominales. Cules son los signos o sntomas? El principal sntoma de esta afeccin es un bulto en el ombligo o cerca de este que no causa dolor. Una hernia reducible puede ser visible solo al hacer fuerza, levantar objetos pesados o toser. Otros sntomas pueden incluir lo siguiente:  Dolor sordo.  Sensacin de opresin. Los sntomas de una hernia estrangulada pueden incluir los siguientes:  Dolor que se vuelve cada vez ms intenso.  Nuseas y vmitos.  Dolor al ejercer presin sobre la hernia.  Cambio de color de la piel que recubre la hernia que se torna roja o violcea.  Estreimiento.  Sangre en las heces. Cmo se diagnostica? Esta afeccin se puede diagnosticar en funcin de lo siguiente:  Un examen fsico. Pueden pedirle que tosa o que haga fuerza mientras est de pie. Estas acciones aumentan la presin dentro del abdomen y empujan la hernia a travs de la abertura en los msculos. El mdico puede ejercer presin sobre la hernia para tratar de reducirla.  Los sntomas y los antecedentes mdicos. Cmo se trata? La ciruga es el nico tratamiento para una hernia umbilical. En el caso de que la hernia  est estrangulada, esta se realiza lo antes posible. Si tiene una hernia pequea que no est encarcelada, tal vez tenga que bajar de peso antes de la [place]. Siga estas indicaciones en su casa:  Baje de peso, si se lo indic el mdico.  No trate de reintroducir la  hernia.  Observe si la hernia cambia de color o de tamao. Infrmele al mdico si se producen cambios.  Tal vez deba evitar las actividades que aumentan la presin sobre la hernia.  No levante objetos que pesen ms de 10libras (4.5kg) hasta que el mdico le diga que es seguro.  Tome los medicamentos de venta libre y los recetados solamente como se lo haya indicado el mdico.  Oceanographer a todas las visitas de control como se lo haya indicado el mdico. Esto es importante. Comunquese con un mdico si:  La hernia se agranda.  La hernia le causa dolor. Solicite ayuda de inmediato si:  Tiene un dolor intenso y repentino cerca de la zona de la hernia.  Tiene dolor, as como nuseas o vmitos.  Tiene dolor y la piel que recubre la hernia cambia de color.  Presenta fiebre. Esta informacin no tiene Theme park manager el consejo del mdico. Asegrese de hacerle al mdico cualquier pregunta que tenga. Document Revised: 06/11/2017 Document Reviewed: 12/04/2016 Elsevier Patient Education  2020 [employer].

## 2019-12-03 ENCOUNTER — Ambulatory Visit: Payer: Medicaid Other | Admitting: Surgery

## 2019-12-03 ENCOUNTER — Telehealth: Payer: Self-pay | Admitting: Surgery

## 2019-12-03 NOTE — Telephone Encounter (Signed)
Ms. Gerald Hodges with [facility] was able to make contact and patient informed of dates regarding surgery and voices understanding.

## 2019-12-03 NOTE — Telephone Encounter (Signed)
Will have Ms. Raynelle Fanning with [facility] to call patient to advise of Pre-Admission date/time, COVID Testing date and Surgery date.  Surgery Date: 12/23/19 Preadmission Testing Date: 12/11/19 (phone 8a-1p) Covid Testing Date: 12/19/19 - patient [facility] [facility]) between 8a-1p   Also please advise patient to call [phone], between 1-3:00pm the day before surgery, to find out what time to arrive for surgery.

## 2019-12-04 ENCOUNTER — Ambulatory Visit (INDEPENDENT_AMBULATORY_CARE_PROVIDER_SITE_OTHER): Payer: Medicaid Other | Admitting: Nurse Practitioner

## 2019-12-04 ENCOUNTER — Encounter: Payer: Self-pay | Admitting: Nurse Practitioner

## 2019-12-04 ENCOUNTER — Other Ambulatory Visit: Payer: Self-pay

## 2019-12-04 VITALS — BP 151/87 | HR 62 | Temp 98.2°F | Wt 188.0 lb

## 2019-12-04 DIAGNOSIS — N1832 Chronic kidney disease, stage 3b: Secondary | ICD-10-CM | POA: Diagnosis not present

## 2019-12-04 DIAGNOSIS — Z01818 Encounter for other preprocedural examination: Secondary | ICD-10-CM | POA: Diagnosis not present

## 2019-12-04 DIAGNOSIS — N281 Cyst of kidney, acquired: Secondary | ICD-10-CM | POA: Diagnosis not present

## 2019-12-04 DIAGNOSIS — E039 Hypothyroidism, unspecified: Secondary | ICD-10-CM | POA: Diagnosis not present

## 2019-12-04 DIAGNOSIS — L299 Pruritus, unspecified: Secondary | ICD-10-CM

## 2019-12-04 DIAGNOSIS — Z23 Encounter for immunization: Secondary | ICD-10-CM

## 2019-12-04 DIAGNOSIS — L308 Other specified dermatitis: Secondary | ICD-10-CM | POA: Insufficient documentation

## 2019-12-04 MED ORDER — CETIRIZINE HCL 5 MG PO TABS: 5.0000 mg | ORAL_TABLET | Freq: Every day | ORAL | 1 refills | Status: DC

## 2019-12-04 NOTE — Assessment & Plan Note (Signed)
Acute, ongoing x 1 month.  Unclear cause although reaction from starting to take levothyroxine on an empty stomach seems likely.  Will continue medication as prescribed and see if this resolves over time.  Encouraged use of daily moisturizer to skin to prevent dry skin.  Also can start daily antihistamine to see if this helps.

## 2019-12-04 NOTE — Progress Notes (Signed)
BP (!) 151/87   Pulse 62   Temp 98.2 F (36.8 C) (Oral)   Wt 188 lb (85.3 kg)   SpO2 97%   BMI 29.44 kg/m    Subjective:    Patient ID: Gerald Hodges, male    DOB: 1935-07-29, 84 y.o.   MRN: [id number]  HPI: Gerald Hodges is a 84 y.o. male presenting for follow up and surgical clearance.  Used interpreter during visit: Sue Lush #[id number]  Chief Complaint  Patient presents with  . Hypertension  . Hypothyroidism  . Skin Problem    pt states has had whole body itching x about a month ago   ITCHY SKIN Patient reports his skin has been itchy all over his body since our last visit.  Around that time, he started taking levothyroxine on an empty stomach.  He is wondering if it is coming from the medication.  He does not use a daily moisturizer. Duration: months - 1 Location: all over body Painful: no Itching: yes Onset: sudden Context: not changing- no rash Associated signs and symptoms: just itchy, no fevers, rash History of skin cancer: no History of precancerous skin lesions: no Family history of skin cancer: no  CHRONIC KIDNEY DISEASE CKD status: stable Medications renally dose: yes Previous renal evaluation: no Pneumovax:  Up to Date Influenza Vaccine:  Up to Date  HYPERTENSION Did not take any of his medications this morning.   Hypertension status: controlled  Satisfied with current treatment? yes Duration of hypertension: chronic BP monitoring frequency:  not checking BP medication side effects:  no Medication compliance: excellent compliance Previous BP meds: amlodipine 10mg , coreg 6.25mg , imdur 60 mg, lasix 40 mg daily. Aspirin: no Recurrent headaches: no Visual changes: no Palpitations: no Dyspnea: yes Chest pain: no Lower extremity edema: no Dizzy/lightheaded: no  HYPOTHYROIDISM Thyroid control status:better Satisfied with current treatment? yes Medication side effects: no Medication compliance: excellent compliance Recent dose  adjustment:yes Fatigue: no Cold intolerance: no Heat intolerance: no Weight gain: no Weight loss: no Constipation: yes Diarrhea/loose stools: no Palpitations: no Lower extremity edema: no Anxiety/depressed mood: no   HERNIA Patient reports he is doing okay with some good and bad days.  With plans to have hernia repair with Dr. Aleen Campi on December 23, 2019 and needs to be medically cleared.  Cardiology is following for Cardiology clearance and has some upcoming follow up appointments with them.  Duration: months  Location: left inguinal Painful: yes Discomfort: yes Bulge: yes Quality:  aching Onset: gradual Severity: moderate Context: not changing  Allergies  Allergen Reactions  . Aspirin Cough and Other (See Comments)  . Lisinopril Itching    Throat itching    Outpatient Encounter Medications as of 12/04/2019  Medication Sig Note  . allopurinol (ZYLOPRIM) 100 MG tablet Take 1 tablet (100 mg total) by mouth daily.   Marland Kitchen amLODipine (NORVASC) 10 MG tablet Take 1 tablet (10 mg total) by mouth daily.   Marland Kitchen atorvastatin (LIPITOR) 20 MG tablet Take 1 tablet (20 mg total) by mouth daily.   . carvedilol (COREG) 6.25 MG tablet Take 1 tablet (6.25 mg total) by mouth 2 (two) times daily with a meal.   . clopidogrel (PLAVIX) 75 MG tablet TAKE 1 TABLET BY MOUTH EVERY DAY (Patient taking differently: Take 75 mg by mouth daily. )   . furosemide (LASIX) 40 MG tablet Take 40 mg by mouth daily as needed for edema (weight gain, shortness of breath).    . isosorbide mononitrate (IMDUR) 60 MG 24  hr tablet Take 1.5 tablets (90 mg total) by mouth daily. (Patient taking differently: Take 60 mg by mouth daily. )   . levothyroxine (SYNTHROID) 25 MCG tablet Take 1 tablet (25 mcg total) by mouth daily before breakfast.   . pantoprazole (PROTONIX) 40 MG tablet Take 1 tablet (40 mg total) by mouth daily.   . cetirizine (ZYRTEC) 5 MG tablet Take 1 tablet (5 mg total) by mouth daily. 12/04/2019: Has not picked up  yet  . nitroGLYCERIN (NITROSTAT) 0.4 MG SL tablet Place 1 tablet (0.4 mg total) under the tongue every 5 (five) minutes as needed for chest pain (chest pain). Maximum of 3 doses.    Facility-Administered Encounter Medications as of 12/04/2019  Medication  . triamcinolone acetonide (KENALOG) 10 MG/ML injection 10 mg  . triamcinolone acetonide (KENALOG) 10 MG/ML injection 10 mg   Patient Active Problem List   Diagnosis Date Noted  . Pruritus 12/04/2019  . Preoperative clearance 12/04/2019  . Left inguinal hernia 12/01/2019  . Abdominal pain 11/03/2019  . Umbilical hernia without obstruction and without gangrene 11/03/2019  . Renal cyst 11/03/2019  . Hypothyroidism 04/12/2019  . Dyspnea on exertion 02/15/2018  . Osteoarthritis of knee 10/11/2016  . Chronic diastolic congestive heart failure (HCC) 08/02/2016  . Knee osteoarthritis 07/21/2016  . Allergic rhinitis 08/04/2015  . Stable angina (HCC) 06/28/2015  . Bunion of unspecified foot 06/28/2015  . Gout 10/09/2014  . Essential hypertension, benign 08/18/2014  . Psoriasis 08/18/2014  . Chronic kidney disease, stage III (moderate) 08/18/2014  . PAC (premature atrial contraction) 02/17/2014  . Chronic kidney disease 11/18/2013  . GERD (gastroesophageal reflux disease) 03/19/2013  . CAD (coronary artery disease) 03/18/2013  . Angina pectoris (HCC) 10/09/2012  . Hyperlipidemia LDL goal <70 10/06/2012   Past Medical History:  Diagnosis Date  . Arthritis Left knee  . Chronic kidney disease    STAGE 3  . Coronary artery disease   . GERD (gastroesophageal reflux disease)   . Gout   . History of kidney problems   . Hyperlipemia 08/18/2014  . Hyperlipidemia   . Hypertension   . TIA (transient ischemic attack)   . Total knee replacement status 10/25/2016   Relevant past medical, surgical, family and social history reviewed and updated as indicated. Interim medical history since our last visit reviewed.  Review of Systems    Constitutional: Negative.  Negative for activity change, appetite change, fatigue and fever.  HENT: Negative.   Respiratory: Positive for shortness of breath (with activity). Negative for chest tightness and wheezing.   Cardiovascular: Negative.  Negative for chest pain and palpitations.  Gastrointestinal: Positive for abdominal pain and constipation. Negative for diarrhea, nausea and vomiting.  Musculoskeletal: Negative.   Skin: Negative for color change and rash.       +itchiness  Neurological: Negative.   Psychiatric/Behavioral: Negative.     Per HPI unless specifically indicated above     Objective:    BP (!) 151/87   Pulse 62   Temp 98.2 F (36.8 C) (Oral)   Wt 188 lb (85.3 kg)   SpO2 97%   BMI 29.44 kg/m   Wt Readings from Last 3 Encounters:  12/04/19 188 lb (85.3 kg)  12/01/19 191 lb 12.8 oz (87 kg)  11/28/19 190 lb 2 oz (86.2 kg)    Physical Exam Vitals and nursing note reviewed.  Constitutional:      General: He is not in acute distress.    Appearance: Normal appearance. He is obese. He is  not toxic-appearing.  HENT:     Head: Normocephalic and atraumatic.  Eyes:     General: No scleral icterus.    Extraocular Movements: Extraocular movements intact.  Cardiovascular:     Rate and Rhythm: Normal rate and regular rhythm.     Heart sounds: Normal heart sounds.  Pulmonary:     Effort: Pulmonary effort is normal. No respiratory distress.     Breath sounds: Normal breath sounds. No wheezing or rhonchi.  Abdominal:     General: Abdomen is flat. Bowel sounds are normal. There is no distension.     Palpations: Abdomen is soft.     Tenderness: There is abdominal tenderness.     Hernia: A hernia is present.  Musculoskeletal:        General: Normal range of motion.  Skin:    General: Skin is warm and dry.     Capillary Refill: Capillary refill takes less than 2 seconds.     Coloration: Skin is not jaundiced or pale.     Findings: Bruising (noted to upper left  arm due to excoriation) present. No erythema.  Neurological:     Mental Status: He is alert and oriented to person, place, and time.     Gait: Gait normal.  Psychiatric:        Mood and Affect: Mood normal.        Behavior: Behavior normal.        Thought Content: Thought content normal.        Judgment: Judgment normal.       Assessment & Plan:   Problem List Items Addressed This Visit      Endocrine   Hypothyroidism    Chronic, ongoing.  Reports he was not taking levothyroxine on an empty stomach but after last visit, started doing so.  Will check TSH today and adjust medication as needed.      Relevant Orders   TSH     Musculoskeletal and Integument   Pruritus    Acute, ongoing x 1 month.  Unclear cause although reaction from starting to take levothyroxine on an empty stomach seems likely.  Will continue medication as prescribed and see if this resolves over time.  Encouraged use of daily moisturizer to skin to prevent dry skin.  Also can start daily antihistamine to see if this helps.        Genitourinary   Chronic kidney disease, stage III (moderate) - Primary    Chronic, appears to be stable.  Will recheck CMP today.  Encouraged plenty of hydration with water or light-colored drinks.      Relevant Orders   Comprehensive metabolic panel     Other   Preoperative clearance    Planning for Oct. 12 surgery with Dr. Aleen Campi: robotic assisted left inguinal hernia repair, open umbilical hernia repair under general anesthesia.  Will medically clear today with medium risk.  Form filled out to be faxed to surgeon's office.  Not yet cleared for surgery by Cardiology - further work-up is scheduled for early next week.  They will handle clearance from cardiac standpoint as well as given instructions on holding Plavix.        Other Visit Diagnoses    Flu vaccine need       Relevant Orders   Flu Vaccine QUAD High Dose(Fluad) (Completed)   Need for pneumococcal vaccine        Relevant Orders   Pneumococcal conjugate vaccine 13-valent IM (Completed)       Follow  up plan: Return in about 3 months (around 03/04/2020) for chronic disease follow up.   30+ minutes spent with the patient today.

## 2019-12-04 NOTE — Assessment & Plan Note (Signed)
Chronic, appears to be stable.  Will recheck CMP today.  Encouraged plenty of hydration with water or light-colored drinks.

## 2019-12-04 NOTE — Assessment & Plan Note (Signed)
Planning for Oct. 12 surgery with Dr. Aleen Campi: robotic assisted left inguinal hernia repair, open umbilical hernia repair under general anesthesia.  Will medically clear today with medium risk.  Form filled out to be faxed to surgeon's office.  Not yet cleared for surgery by Cardiology - further work-up is scheduled for early next week.  They will handle clearance from cardiac standpoint as well as given instructions on holding Plavix.

## 2019-12-04 NOTE — Patient Instructions (Signed)
Be sure to start a daily moisturizer like Cera Ve to help with the itchiness.    Your levothyroxine needs to be taken on an empty stomach but the rest of your medications can be taken with food.  Do not take the levothyroxine with any other medication including your omeprazole.  Wait 30 minutes after taking levothyroxine to eat or take any other medications.  Levothyroxine tablets Qu es este medicamento? La LEVOTIROXINA es una hormona tiroidea. Este medicamento puede [employer] sntomas de deficiencia tiroidea, tales como hablar con lentitud, falta de energa, aumento de [place], cada del cabello, piel seca y sensibilidad inusual al fro. Tambin sirve para tratar una enfermedad llamada bocio (la dilatacin de la glndula tiroides). Se utiliza tambin para tratar algunos tipos de cncer tiroideo junto con la [place] y otros medicamentos. Este medicamento puede ser utilizado para otros usos; si tiene alguna pregunta consulte con su proveedor de atencin mdica o con su farmacutico. MARCAS COMUNES: Estre, Euthyrox, Levo-T, Levothroid, Levoxyl, Synthroid, Thyro-Tabs, Unithroid [employer] a mi profesional de la salud antes de tomar este medicamento? Necesitan saber si usted presenta alguno de los siguientes problemas o situaciones: enfermedad de [place] u otro problema de la glndula suprarrenal angina de pecho problemas seos diabetes hace dieta o sigue un programa para bajar de peso problemas de fertilidad enfermedad cardiaca problemas de la glndula pituitaria si toma medicamentos que tratan o previenen cogulos sanguneos una reaccin alrgica o inusual a la levotiroxina, a las hormonas tiroideas, a otros medicamentos, alimentos, colorantes o conservantes si est embarazada o buscando quedar embarazada si est amamantando a un beb Cmo debo [employer]? Tome este medicamento por va oral con abundante agua. Es mejor tomarlo con el estmago vaco, al menos 30 minutos a una  hora antes de Engineer, maintenance. Evite tomar anticidos que contengan aluminio o magnesio, simeticona, secuestrantes de cidos biliares, carbonato de calcio, sulfonato sdico de poliestireno, sulfato de hierro, sevelamer, lantano o sucralfato en un plazo de 4 horas despus de tomar [employer]. Siga las instrucciones de la etiqueta del [place]. Tmelo a la Manufacturing systems engineer. No use su medicamento con una frecuencia mayor a la indicada. Hable con su pediatra acerca del uso de este medicamento en nios. Aunque este medicamento se puede recetar a nios y Investment banker, corporate tan pequeos como de unos [address] de edad con ciertas afecciones, existen precauciones que deben tomarse. Para los lactantes, puede triturar la tableta y colocarla en una pequea cantidad (5 a 10 ml o 1 a 2 cucharaditas) de agua, leche materna, o frmula infantil que no sea a base de soya. No la mezcle con frmula infantil a base de soya. Administre segn las instrucciones. Sobredosis: Pngase en contacto inmediatamente con un centro toxicolgico o una sala de urgencia si usted cree que haya tomado demasiado medicamento. ATENCIN: [employer] es solo para usted. No comparta este medicamento con nadie. Qu sucede si me olvido de una dosis? Si olvida una dosis, tmela lo antes posible. Si es casi la hora de la prxima dosis, tome slo esa dosis. No tome dosis adicionales o dobles. Qu puede interactuar con este medicamento? amiodarona anticidos medicamentos antitiroideos suplementos de calcio carbamazepina ciertos medicamentos para la depresin ciertos medicamentos para tratar el cncer colestiramina clofibrato colesevelam colestipol digoxina hormonas femeninas, tales como estrgenos y pldoras anticonceptivas, parches, anillos o inyecciones suplementos de hierro ketamina lantano productos de nutricin lquidos tales como Ensure litio medicamentos para resfros y dificultades respiratorias medicamentos para la diabetes medicamentos o  suplementos dietticos para bajar de peso metadona niacina orlistat oxandrolona fenobarbital u otros barbitricos fenitona rifampicina sevelamer simeticona sulfonato sdico de poliestireno isoflavonas de soya medicamentos esteroideos, tales como la prednisona o la cortisona sucralfato testosterona teofilina warfarina Puede ser que esta lista no menciona todas las posibles interacciones. Informe a su profesional de [employer] de [employer] productos a base de hierbas, medicamentos de [place] o suplementos nutritivos que est tomando. Si usted fuma, consume bebidas alcohlicas o si utiliza drogas ilegales, indqueselo tambin a su profesional de [employer]. Algunas sustancias pueden interactuar con su medicamento. A qu debo estar atento al usar [employer]? Asegrese de tomar [employer] con lquidos en abundancia. Ciertas marcas de las tabletas pueden provocar asfixia, arcadas o dificultad al tragar a caso de que la tableta se le puede quedar atascada en la garganta. La mayora de estos problemas se desaparecen cuando tome el medicamento con la cantidad [place] de agua u otros lquidos. No cambie de marca de [employer] a menos que su profesional de la salud est de acuerdo. Consulte con su profesional de la salud si no est seguro. Necesitar realizarse [employer] regularmente y [place] de sangre de [place] en cuando para evaluar su respuesta al [place]. Si est [employer] para el hipotiroidismo, pueden transcurrir varias semanas antes de que observe [employer]. Consulte con su mdico o con su profesional de la salud si sus sntomas no mejoran. Tal vez deba usar este medicamento de por vida. No deje de usarlo a menos que su mdico o su profesional de [employer] as lo indique. Este medicamento puede afectar su nivel de Banker. Si tiene diabetes, controle su nivel de azcar como se le haya indicado. Es posible que se le caiga un poco el cabello al comenzar [place]. Por lo general este problema se resuelve solo. Si va a someterse a una operacin, informe a su mdico o a su profesional de la salud que est tomando [employer]. Qu efectos secundarios puedo tener al [employer] este medicamento? Efectos secundarios que debe informar a su mdico o a Producer, television/film/video de la salud tan pronto como sea posible: Therapist, art, como erupcin cutnea, comezn/picazn o urticaria, e hinchazn de la cara, los labios o la lengua ansiedad problemas respiratorios cambios en los periodos Aeronautical engineer en el pecho diarrea sudoracin excesiva o intolerancia al calor ritmo cardiaco rpido o irregular calambres en las piernas nerviosismo hinchazn de tobillos, pies o piernas temblores dificultad para conciliar el sueo vmito Efectos secundarios que generalmente no requieren atencin mdica (infrmelos a su mdico o a Producer, television/film/video de la salud si persisten o si son molestos): cambios en el apetito dolor de cabeza irritabilidad nuseas prdida de peso Puede ser que esta lista no menciona todos los posibles efectos secundarios. Comunquese a su mdico por asesoramiento mdico [employer]. Usted puede informar los efectos secundarios a la FDA por telfono al 1-800-FDA-1088. Dnde debo guardar mi medicina? Mantngala fuera del alcance de los nios. Gurdela a [employer], entre 15 y 30 grados C (79 y 67 grados F). Protjala de la luz y de la humedad. Mantenga el envase bien cerrado. Deseche todo el medicamento que no haya utilizado, despus de la fecha de vencimiento. ATENCIN: Este folleto es un resumen. Puede ser que no cubra toda la posible informacin. Si usted tiene preguntas acerca de esta medicina, consulte con su mdico, su farmacutico o su profesional de Radiographer, therapeutic.  2020 Elsevier/Gold Standard (2019-01-01 00:00:00)

## 2019-12-04 NOTE — Assessment & Plan Note (Signed)
Chronic, ongoing.  Reports he was not taking levothyroxine on an empty stomach but after last visit, started doing so.  Will check TSH today and adjust medication as needed.

## 2019-12-05 LAB — COMPREHENSIVE METABOLIC PANEL
ALT: 18 IU/L (ref 0–44)
AST: 23 IU/L (ref 0–40)
Albumin/Globulin Ratio: 1.8 (ref 1.2–2.2)
Albumin: 4.8 g/dL — ABNORMAL HIGH (ref 3.6–4.6)
Alkaline Phosphatase: 77 IU/L (ref 44–121)
BUN/Creatinine Ratio: 15 (ref 10–24)
BUN: 25 mg/dL (ref 8–27)
Bilirubin Total: 0.9 mg/dL (ref 0.0–1.2)
CO2: 25 mmol/L (ref 20–29)
Calcium: 10.4 mg/dL — ABNORMAL HIGH (ref 8.6–10.2)
Chloride: 100 mmol/L (ref 96–106)
Creatinine, Ser: 1.69 mg/dL — ABNORMAL HIGH (ref 0.76–1.27)
GFR calc Af Amer: 43 mL/min/{1.73_m2} — ABNORMAL LOW (ref >59)
GFR calc non Af Amer: 37 mL/min/{1.73_m2} — ABNORMAL LOW (ref >59)
Globulin, Total: 2.6 g/dL (ref 1.5–4.5)
Glucose: 98 mg/dL (ref 65–99)
Potassium: 4.6 mmol/L (ref 3.5–5.2)
Sodium: 138 mmol/L (ref 134–144)
Total Protein: 7.4 g/dL (ref 6.0–8.5)

## 2019-12-05 LAB — TSH: TSH: 10.6 u[IU]/mL — ABNORMAL HIGH (ref 0.450–4.500)

## 2019-12-05 NOTE — Addendum Note (Signed)
Addended by: Cathlean Marseilles A on: 12/05/2019 08:58 AM   Modules accepted: Orders

## 2019-12-05 NOTE — Progress Notes (Signed)
Medical Clearance received from Cathlean Marseilles, NP. Patient cleared at Medium risk medically. Still awaiting clearance from Cardiology regarding blood thinner.

## 2019-12-05 NOTE — Addendum Note (Signed)
Addended by: Enedina Finner on: 12/05/2019 08:59 AM   Modules accepted: Orders

## 2019-12-07 LAB — THYROID PANEL
Free Thyroxine Index: 1.6 (ref 1.2–4.9)
T3 Uptake Ratio: 26 % (ref 24–39)
T4, Total: 6.1 ug/dL (ref 4.5–12.0)

## 2019-12-07 LAB — SPECIMEN STATUS REPORT

## 2019-12-08 ENCOUNTER — Ambulatory Visit: Admission: RE | Admit: 2019-12-08 | Payer: Medicaid Other | Source: Ambulatory Visit

## 2019-12-11 ENCOUNTER — Other Ambulatory Visit: Payer: Self-pay

## 2019-12-11 ENCOUNTER — Encounter
Admission: RE | Admit: 2019-12-11 | Discharge: 2019-12-11 | Disposition: A | Discharge status: Home / Self Care | Payer: Medicaid Other | Source: Ambulatory Visit | Attending: Surgery | Admitting: Surgery

## 2019-12-11 HISTORY — DX: Hypothyroidism, unspecified: E03.9

## 2019-12-11 HISTORY — DX: Angina pectoris, unspecified: I20.9

## 2019-12-11 NOTE — Patient Instructions (Addendum)
Your procedure is scheduled on: Tuesday December 23, 2019. Su procedimiento est programado para: Martes 12 de [place] del 2021. Report to Day Surgery inside [facility] floor. Presntese a: Diplomatic Services operational officer del [facility] 2ndo piso.  To find out your arrival time please call [phone] between 1PM - 3PM on Monday December 22, 2019. Para saber su hora de llegada por favor llame al [phone] Eusebio Me la 1PM - 3PM el da: Lunes 11 de [place] del 2021.  Remember: Instructions that are not followed completely may result in serious medical risk, up to and including death,  or upon the discretion of your surgeon and anesthesiologist your surgery may need to be rescheduled.  Recuerde: Las instrucciones que no se siguen completamente Armed forces logistics/support/administrative officer en un riesgo de salud grave, incluyendo hasta  la [place] o a discrecin de su cirujano y Scientific laboratory technician, su ciruga se puede posponer.   __X_ 1.Do not eat food after midnight the night before your procedure. No    gum chewing or hard candies. You may drink clear liquids up to 2 hours     before you are scheduled to arrive for your surgery- DO not drink clear     Liquids within 2 hours of the start of your surgery.     Clear Liquids include:    water, apple juice without pulp, clear carbohydrate drink such as    Clearfast of [place]artorade, Black Coffee or Tea (Do not add anything to coffee or tea).      No coma nada despus de la medianoche de la noche anterior a su    procedimiento. No coma chicles ni caramelos duros. Puede tomar    lquidos claros hasta 2 horas antes de su hora programada de llegada al     hospital para su procedimiento. No tome lquidos claros durante el     transcurso de las 2 horas de su llegada programada al hospital para su     procedimiento, ya que esto puede llevar a que su procedimiento se    retrase o tenga que volver a Magazine features editor.  Los lquidos claros incluyen:          - Agua o jugo de [place] sin  pulpa          - Bebidas claras con carbohidratos como ClearFast o Gatorade          - Caf negro o t claro (sin leche, sin cremas, no agregue nada al caf ni al t)  No tome nada que no est en esta lista.  Los pacientes con diabetes tipo 1 y tipo 2 solo deben Printmaker.  Llame a la clnica de [facility] o a la unidad de Same Day Surgery si  tiene alguna pregunta sobre estas instrucciones.              _X__ 2.Do Not Smoke or use e-cigarettes For 24 Hours Prior to Your Surgery.    Do not use any chewable tobacco products for at least 6   hours prior to surgery.    No fume ni use cigarrillos electrnicos durante las 24 horas previas    a su [place].  No use ningn producto de tabaco masticable durante   al menos 6 horas antes de la [place].     __X_ 3. No alcohol for 24 hours before or after surgery.    No tome alcohol durante las 24 horas antes ni despus de la [place].   __x__4. Brush your teeth, as you normally do, make  sure not to swallow any toothpaste or mouthwash.    Cepillece los deitnes, como usted normalmente lo ase, asegurese de no tragarse la pasta de dientes o enjuage bucal.    __x__ 5. Notify your doctor if there is any change in your medical condition (cold,fever, infections).    Informe a su mdico si hay algn cambio en su condicin mdica  (resfriado, fiebre, infecciones).   Do not wear jewelry, make-up, hairpins, clips or nail polish.  No use joyas, maquillajes, pinzas/ganchos para el cabello ni esmalte de uas.  Do not wear lotions, powders, or perfumes. You may wear deodorant.  No use lociones, polvos o perfumes.  Puede usar desodorante.    Do not shave 48 hours prior to surgery. Men may shave face and neck.  No se afeite 48 horas antes de la [place].  Los hombres pueden [employer] cara  y el cuello.   Do not bring valuables to the hospital.   No lleve objetos de valor al hospital.  [facility] is not responsible for any belongings or valuables.  [place] no  se hace responsable de ningn tipo de pertenencias u objetos de Licensed conveyancer.               Contacts, dentures or bridgework may not be worn into surgery.  Los lentes de [place], las dentaduras postizas o puentes no se pueden usar en la [place].   Leave your suitcase in the car. After surgery it may be brought to your room.  Deje su maleta en el auto.  Despus de la ciruga podr traerla a su habitacin.   For patients admitted to the hospital, discharge time is determined by your  treatment team.  Para los pacientes que sean ingresados al hospital, el tiempo en el cual se le  dar de alta es determinado por su equipo de [place].   Patients discharged the day of surgery will not be allowed to drive home. A los pacientes que se les da de alta el mismo da de la ciruga no se les permitir conducir a Higher education careers adviser.     __x__ Take these medicines the morning of surgery with A SIP OF WATER:          Tome estas medicinas la maana de la ciruga con UN SORBO DE AGUA:  1. levothyroxine (SYNTHROID) 25 MCG  2. pantoprazole (PROTONIX) 40 MG   3. amLODipine (NORVASC) 10 MG   4. carvedilol (COREG) 6.25 MG      5. isosorbide mononitrate (IMDUR) 60 MG      __x__ Use CHG Soap as directed          Utilice el jabn de CHG segn lo indicado   __x__ Stop clopidogrel (PLAVIX) 75 MG last dose will be 12/15/19          Deje de tomar el clopidogrel (PLAVIX) 75 MG ultima dia 4 de Octubre.  __x__ Stop Anti-inflammatories such as Ibuprofen, Aleve, Advil, naproxen, and or BC powders.           Deje de tomar antiinflamatorios como Ibuprofen, Aleve, Advil, naproxen o polvos de BC powder.   __x__ Stop supplements until after surgery            Deje de tomar suplementos hasta despus de la ciruga  __x__ Do not take any herbal supplements before your surgery.   No empize a tomar suplementos de hierbas antes de su cirugia.

## 2019-12-12 ENCOUNTER — Other Ambulatory Visit: Payer: Self-pay

## 2019-12-12 ENCOUNTER — Ambulatory Visit
Admission: RE | Admit: 2019-12-12 | Discharge: 2019-12-12 | Disposition: A | Discharge status: Home / Self Care | Payer: Medicaid Other | Source: Ambulatory Visit | Attending: Physician Assistant | Admitting: Physician Assistant

## 2019-12-12 DIAGNOSIS — I493 Ventricular premature depolarization: Secondary | ICD-10-CM | POA: Diagnosis not present

## 2019-12-12 MED ORDER — TECHNETIUM TC 99M TETROFOSMIN IV KIT
10.4490 | PACK | Freq: Once | INTRAVENOUS | Status: AC | PRN
  Administered 2019-12-12: 10.449 via INTRAVENOUS

## 2019-12-12 MED ORDER — TECHNETIUM TC 99M TETROFOSMIN IV KIT
29.9690 | PACK | Freq: Once | INTRAVENOUS | Status: AC | PRN
  Administered 2019-12-12: 29.969 via INTRAVENOUS

## 2019-12-12 MED ORDER — REGADENOSON 0.4 MG/5ML IV SOLN
0.4000 mg | Freq: Once | INTRAVENOUS | Status: AC
  Administered 2019-12-12: 0.4 mg via INTRAVENOUS
  Filled 2019-12-12: qty 5

## 2019-12-15 LAB — NM MYOCAR MULTI W/SPECT W/WALL MOTION / EF
LV dias vol: 93 mL (ref 62–150)
LV sys vol: 32 mL
Peak HR: 88 {beats}/min
Percent HR: 63 %
Rest HR: 74 {beats}/min
SDS: 6
SRS: 3
SSS: 6
TID: 0.98

## 2019-12-15 NOTE — Progress Notes (Signed)
[facility] Peri[facility] Services  Pre-Admission/Anesthesia Testing Clinical Review  Date: 12/18/19  Patient Demographics:  Name: Gerald Hodges DOB:   May 19, 1935 MRN:   [id number]  Planned Surgical Procedure(s):    Case: [id number] Date/Time: 01/01/20 1320   Procedures:      XI ROBOTIC ASSISTED INGUINAL HERNIA REPAIR WITH MESH (Left )     HERNIA REPAIR UMBILICAL ADULT (N/A )   Anesthesia type: General   Pre-op diagnosis: Left inguinal hernia, umbilical hernia   Location: [facility] OR ROOM 04 / [facility]   Surgeons: Henrene Dodge, MD     NOTE: Available PAT nursing documentation and vital signs have been reviewed. Clinical nursing staff has updated patient's PMH/PSHx, current medication list, and drug allergies/intolerances to ensure comprehensive history available to assist in medical decision making as it pertains to the aforementioned surgical procedure and anticipated anesthetic course.   Clinical Discussion:  Gerald Hodges is a 84 y.o. male who is submitted for pre-surgical anesthesia review and clearance prior to him undergoing the above procedure. Patient has never been a smoker. Pertinent PMH includes: CAD, unstable angina, PACs on ECG, HLD, HTN, TIA, DOE, CKD-III, GERD (on daily PPI), hypothyroidism, OA.  Patient is followed by cardiology (End, MD). He was last seen in the cardiology clinic on 11/28/2019; notes reviewed.  At the time of his clinic visit, patient reported to be doing "reasonably well" from a cardiac perspective.  Notes indicate a single episode of angina since last office visit that occurred on 11/16/2019 that was associated with concurrent generalized weakness and diaphoresis.  Patient self administered SL NTG x 1 with no improvement.  Episode lasted for 3 to 5 minutes before fully resolving without further treatment. Patient reported to maintain an active lifestyle without exertional symptoms.  DASI  documented as >4 METS. Blood pressure reasonably controlled on prescribed CCB, beta-blocker, ARB, and nitrate medications.  He is on a statin for his HLD.  Patient compliant with daily clopidogrel.  He is no longer on daily ASA due to documented allergy. Last Myoview done and 10/2016 demonstrated no significant stress-induced ischemia, however there was a small in size, subtle in severity, fixed mild inferolateral and basal defect consistent with either artifact or subtle scar; LVEF 56%; no significant coronary artery calcification.  TTE done in 11/30/2017 revealed an LVEF of 55-60% (see full cardiovascular testing results below).  ECG in clinic that day revealed no PVCs and a stable first-degree AV block.  Patient scheduled follow-up with cardiology on an outpatient basis in 3 months.  Patient is scheduled to undergo elective hernia repair on 12/23/2019 with Dr. Ernesto Rutherford.  Given patient's past medical history significant for cardiac issues and other comorbid conditions, presurgical cardiac clearance was sought by the attending surgeons office.  Cardiology notes that patient's current RCI, as of 11/28/2019 visit, places him at high risk for noncardiac surgery. Due to patient's recent symptoms, the decision was made to repeat his TTE and MPI studies prior to issuing cardiac clearance.  Repeat Lexiscan performed on 12/12/2019 revealed an LVEF of 42% with no evidence of significant ischemia.  Repeat TTE performed on 12/17/2019 revealed a normal LV systolic function with an LVEF of 55-60% (see full interpretation of cardiovascular testing below). Per cardiology, patient is at an acceptable risk for noncardiac surgery and may proceed as planned without further cardiovascular testing or intervention. Patient has also been cleared by internal/family medicine with a MODERATE risk stratification. Again, patient is on daily anticoagulation therapy.  He has been instructed on recommendations for holding his clopidogrel  for 7 days prior to his procedure. The patient has been instructed that his last dose of his anticoagulant will be on 12/15/2019.  He denies previous perioperative complications with anesthesia. He underwent a neuraxial anesthetic course here (ASA III) in 10/2016 with no documented complications.   Vitals with BMI 12/11/2019 12/04/2019 12/04/2019  Height 5\' 7"  - -  Weight 190 lbs - 188 lbs  BMI 29.75 - 29.44  Systolic - 151 157  Diastolic - 87 88  Pulse - 62 62    Providers/Specialists:   NOTE: Primary physician provider listed below. Patient may have been seen by APP or partner within same practice.   PROVIDER ROLE LAST , MD General Surgery 12/01/2019  12/03/2019, MD Primary Care Provider 12/04/2019  End, 12/06/2019, MD Cardiology 11/28/2019   Allergies:  Aspirin and Lisinopril  Current Home Medications:    triamcinolone acetonide (KENALOG) 10 MG/ML injection 10 mg   triamcinolone acetonide (KENALOG) 10 MG/ML injection 10 mg    allopurinol (ZYLOPRIM) 100 MG tablet   amLODipine (NORVASC) 10 MG tablet   atorvastatin (LIPITOR) 20 MG tablet   carvedilol (COREG) 6.25 MG tablet   clopidogrel (PLAVIX) 75 MG tablet   furosemide (LASIX) 40 MG tablet   isosorbide mononitrate (IMDUR) 60 MG 24 hr tablet   levothyroxine (SYNTHROID) 25 MCG tablet   nitroGLYCERIN (NITROSTAT) 0.4 MG SL tablet   pantoprazole (PROTONIX) 40 MG tablet   cetirizine (ZYRTEC) 5 MG tablet   losartan (COZAAR) 50 MG tablet   History:   Past Medical History:  Diagnosis Date   Anginal pain (HCC)    Arthritis Left knee   Chronic kidney disease    STAGE 3   Coronary artery disease    GERD (gastroesophageal reflux disease)    Gout    History of kidney problems    Hyperlipemia 08/18/2014   Hyperlipidemia    Hypertension    Hypothyroidism    TIA (transient ischemic attack)    Total knee replacement status 10/25/2016   Past Surgical History:  Procedure  Laterality Date   BUNIONECTOMY Bilateral    EYE SURGERY Bilateral    Cataractt Extraction with IOL   JOINT REPLACEMENT     TOTAL KNEE ARTHROPLASTY Left 10/25/2016   Procedure: TOTAL KNEE ARTHROPLASTY;  Surgeon: 10/27/2016, MD;  Location: [facility] ORS;  Service: Orthopedics;  Laterality: Left;   Family History  Problem Relation Age of Onset   Cancer Mother        stomach   Heart disease Neg Hx    Social History   Tobacco Use   Smoking status: Never Smoker   Smokeless tobacco: Never Used  Vaping Use   Vaping Use: Never used  Substance Use Topics   Alcohol use: No   Drug use: No    Pertinent Clinical Results:  LABS: Labs reviewed: Acceptable for surgery.       ECG: Date: 11/28/2019 Time ECG obtained: 0933 AM Rate: 75 bpm Rhythm:  Sinus rhythm with first-degree AV block Axis (leads I and aVF): Normal Intervals: PR 218 ms. QRS 74 ms. QTc 415 ms. ST segment and T wave changes: No evidence of acute ST segment elevation or depression Comparison: Similar to previous tracing obtained on 04/02/2019   IMAGING / PROCEDURES: ECHOCARDIOGRAM done on 12/17/2019 1. Left ventricular ejection fraction, by estimation, is 55 to 60%. The  left ventricle has normal function. The left ventricle has no regional  wall motion abnormalities. There is mild left ventricular hypertrophy.  Left ventricular diastolic parameters are consistent with Grade II diastolic dysfunction (pseudonormalization). 2. Right ventricular systolic function is normal. The right ventricular size is normal.  3. The mitral valve is normal in structure. Mild mitral valve regurgitation. 4. The aortic valve is tricuspid. Aortic valve regurgitation is mild. 5. The inferior vena cava is dilated in size with >50% respiratory variability, suggesting right atrial pressure of 8 mmHg.   LEXISCAN done on 12/12/2019 1. Pharmacological myocardial perfusion imaging study with no significant ischemia 2. Small to  moderate-sized region of predominantly fixed defect in the inferolateral wall, challenging to interpret in the setting of GI uptake artifact 3. Normal wall motion, EF estimated at 42% (possibly depressed secondary to artifact) 4. No EKG changes concerning for ischemia at peak stress or in recovery. 5. CT attenuation correction images with very mild aortic atherosclerosis, no notable coronary calcification 6. Low risk scan  CT ABDOMEN PELVIS W/ CONTRAST done on 11/27/2019 1. Fat containing left inguinal and umbilical hernias. No bowel herniation. 2. Enlarged prostate. 3. Asymmetric wall thickening along the left anterolateral aspect of the urinary bladder. Cystoscopy may be useful for further evaluation. 4. Bilateral renal hypodensities, the majority of which are compatible with simple cysts. Probable minimally complex hyperdense cyst right kidney. 5. Diverticulosis without diverticulitis.  Impression and Plan:  Gerald Hodges has been referred for pre-anesthesia review and clearance prior to him undergoing the planned anesthetic and procedural courses. Available labs, pertinent testing, and imaging results were personally reviewed by me. This patient has been appropriately cleared by internal medicine and cardiology.   Based on clinical review performed today (12/18/19), barring any significant acute changes in the patient's overall condition, it is anticipated that he will be able to proceed with the planned surgical intervention. Any acute changes in clinical condition may necessitate his procedure being postponed and/or cancelled. Pre-surgical instructions were reviewed with the patient during his PAT appointment and questions were fielded by PAT clinical staff.  Quentin Mulling, MSN, APRN, FNP-C, CEN [facility]  Peri-operative Services Nurse Practitioner Phone: [phone] 12/18/19 5:18 PM  NOTE: This note has been prepared using Dragon dictation software.  Despite my best ability to proofread, there is always the potential that unintentional transcriptional errors may still occur from this process.

## 2019-12-16 ENCOUNTER — Telehealth: Payer: Self-pay | Admitting: Emergency Medicine

## 2019-12-16 NOTE — Telephone Encounter (Signed)
Called over to [employer] office [facility]) and spoke to [place] in regards to patients clearance. Per jennifer patient has an echo scheduled for tomorrow, and may received results by Thursday afternoon potentially.  With that being said patient is still on Plavix and Dr Aleen Campi was hoping patient be off of medication 7 days prior to sx. Spoke to [place] (surgery scheduler) and will reschedule patients sx to 01/01/20. In the case patient will have time to be cleared from Cardio and that he will be able to stop Plavix on time.  Spoke to [place] (patients son) and made him aware of the changes and why. Patients son understands and will await new surgery information.

## 2019-12-17 ENCOUNTER — Other Ambulatory Visit: Payer: Self-pay

## 2019-12-17 ENCOUNTER — Ambulatory Visit (INDEPENDENT_AMBULATORY_CARE_PROVIDER_SITE_OTHER): Payer: Medicaid Other

## 2019-12-17 DIAGNOSIS — I351 Nonrheumatic aortic (valve) insufficiency: Secondary | ICD-10-CM

## 2019-12-17 LAB — ECHOCARDIOGRAM COMPLETE
AR max vel: 1.46 cm2
AV Area VTI: 1.57 cm2
AV Area mean vel: 1.53 cm2
AV Mean grad: 7 mmHg
AV Peak grad: 12.1 mmHg
Ao pk vel: 1.74 m/s
Area-P 1/2: 3.68 cm2
Calc EF: 51.8 %
P 1/2 time: 355 msec
Single Plane A2C EF: 52.4 %
Single Plane A4C EF: 51.6 %

## 2019-12-18 ENCOUNTER — Telehealth: Payer: Self-pay

## 2019-12-18 ENCOUNTER — Telehealth: Payer: Self-pay | Admitting: Surgery

## 2019-12-18 NOTE — Telephone Encounter (Signed)
Spoke with patients son and relayed result notes. He verbalized understanding and was grateful for the phone call.

## 2019-12-18 NOTE — Telephone Encounter (Signed)
Called patient using interpreter [phone] and left a VM to call back.  Wanted to give patient the following Echo and Stress Test result notes:    Sondra Barges, PA-C  P Cv Div [place] Triage  Echo showed normal pump function, slightly stiffened heart, mildly leaky mitral valve, mildly leaky aortic valve. Overall, reassuring echo.   Recommendations:  -Please see result note on stress test as well. Preoperative recommendations will be on that result note.  -Optimal blood pressure control is recommended. This was reasonably controlled at his visit.  Stress test showed no significant ischemia. EF falsely low in the setting of artifact. EF was normal on follow up echo. No significant coronary artery calcium on CT images.   Recommendations: -Patient may proceed with noncardiac surgery without further testing.  -Request for holding Plavix has been sent from the Preop pool to Dr. Okey Dupre. Once that recommendation has been provided, please forward to Dr. Adelene Idler office.

## 2019-12-18 NOTE — Telephone Encounter (Signed)
Gerald Hodges 84 year old male was last seen in the clinic on 11/28/2019.  He had one episode of chest pain 11/16/2019 which was not responsive to nitroglycerin.  He underwent stress testing which showed low risk.  May his Plavix be held for his upcoming hernia repair?  His PMH includes-essential hypertension, CAD, chronic diastolic CHF, GERD, hypothyroidism, knee osteoarthritis, CKD stage III, and hyperlipidemia.  Please direct response to CV DIV preop for.  Thank you for your help.  Thomasene Ripple. Emrick Hensch NP-C    12/18/2019, 4:01 PM [facility] [address] Office [phone] Fax [phone]

## 2019-12-18 NOTE — Telephone Encounter (Signed)
-----   Message from Karen Kitchens, NP sent at 12/18/2019  2:50 PM EDT ----- Regarding: Request for pre-operative cardiac clearance Request for pre-operative cardiac clearance:  1. What type of surgery is being performed? Robot assisted inguinal hernia repair  2. When is this surgery scheduled? RESCHEDULED to 01/01/2020 due to patient not holding anticoagulation therapy.   3. Are there any medications that need to be held prior to surgery and how long? Clopidogrel x 7 days.  4. Practice name and name of physician performing surgery?  Performing surgeon: Dr. Olean Ree, MD Requesting clearance: Honor Loh, FNP-C    5. Anesthesia type (none, local, MAC, general)? General   6. What is the office phone and fax number?  [phone] (phone)   [phone] (fax) ATTENTION: Once cleared, or if further information is required, please route message back to Honor Loh, FNP-C via [facility] No need to FAX to surgeon or PAT office. Note to be reviewed by APP in [facility]. Unable to create telephone message as per your standard workflow. Directed by [facility] providers to send requests for cardiac clearance to this pool for appropriate distribution to provider covering pre-operative clearances.    Honor Loh, MSN, APRN, FNP-C, CEN [facility]  Peri-operative Services Nurse Practitioner Phone: [phone] 12/18/19 2:50 PM

## 2019-12-18 NOTE — Telephone Encounter (Signed)
Updated information regarding rescheduled surgery.  Patient's son, Lyn Hollingshead has been advised of Pre-Admission date/time, COVID Testing date and Surgery date.  Surgery Date: 01/01/20 Preadmission Testing Date: 12/11/19 (phone already done) Covid Testing Date: 12/30/19 - patient [facility] to [facility] [facility]) between 8a-1p   Also they are made aware to call [phone], between 1-3:00pm the day before surgery, to find out what time to arrive for surgery.

## 2019-12-18 NOTE — Telephone Encounter (Signed)
Given atypical CP x 1 and low risk stress test last month, clopidogrel can be held for 7 days before surgery and restarted when felt safe to do so by his surgeon.  Yvonne Kendall, MD [facility]

## 2019-12-19 ENCOUNTER — Other Ambulatory Visit: Payer: Medicaid Other

## 2019-12-19 ENCOUNTER — Telehealth: Payer: Self-pay | Admitting: Emergency Medicine

## 2019-12-19 NOTE — Telephone Encounter (Signed)
Cardiac clearance notes in Epic from Dr Okey Dupre and Alycia Rossetti, Gerald Hodges.   "Patient may proceed with noncardiac surgery without further testing. Clopidogrel can be held for 7 days before surgery and restarted when felt safe to do so by his surgeon." Per note, they have notified son about holding medication.

## 2019-12-23 ENCOUNTER — Other Ambulatory Visit: Payer: Medicaid Other

## 2019-12-30 ENCOUNTER — Other Ambulatory Visit
Admission: RE | Admit: 2019-12-30 | Discharge: 2019-12-30 | Disposition: A | Discharge status: Home / Self Care | Payer: Medicaid Other | Source: Ambulatory Visit | Attending: Surgery | Admitting: Surgery

## 2019-12-30 ENCOUNTER — Other Ambulatory Visit: Payer: Self-pay

## 2019-12-30 DIAGNOSIS — Z20822 Contact with and (suspected) exposure to covid-19: Secondary | ICD-10-CM | POA: Diagnosis not present

## 2019-12-30 DIAGNOSIS — Z01812 Encounter for preprocedural laboratory examination: Secondary | ICD-10-CM | POA: Insufficient documentation

## 2019-12-30 LAB — SARS CORONAVIRUS 2 (TAT 6-24 HRS): SARS Coronavirus 2: NEGATIVE

## 2019-12-31 MED ORDER — ACETAMINOPHEN 500 MG PO TABS: 1000.0000 mg | ORAL_TABLET | ORAL | Status: AC

## 2019-12-31 MED ORDER — CHLORHEXIDINE GLUCONATE 0.12 % MT SOLN: 15.0000 mL | Freq: Once | OROMUCOSAL | Status: AC

## 2019-12-31 MED ORDER — SODIUM CHLORIDE 0.9 % IV SOLN: INTRAVENOUS | Status: DC

## 2019-12-31 MED ORDER — CEFAZOLIN SODIUM-DEXTROSE 2-4 GM/100ML-% IV SOLN
2.0000 g | INTRAVENOUS | Status: AC
  Administered 2020-01-01: 2 g via INTRAVENOUS

## 2019-12-31 MED ORDER — GABAPENTIN 100 MG PO CAPS: 100.0000 mg | ORAL_CAPSULE | ORAL | Status: AC

## 2019-12-31 MED ORDER — ORAL CARE MOUTH RINSE: 15.0000 mL | Freq: Once | OROMUCOSAL | Status: AC

## 2019-12-31 MED ORDER — BUPIVACAINE LIPOSOME 1.3 % IJ SUSP: 20.0000 mL | Freq: Once | INTRAMUSCULAR | Status: DC

## 2020-01-01 ENCOUNTER — Other Ambulatory Visit: Payer: Self-pay

## 2020-01-01 ENCOUNTER — Encounter
Admission: RE | Disposition: A | Discharge status: Home / Self Care | Payer: Self-pay | Source: Home / Self Care | Attending: Surgery

## 2020-01-01 ENCOUNTER — Ambulatory Visit: Payer: Medicaid Other | Admitting: Urgent Care

## 2020-01-01 ENCOUNTER — Encounter: Payer: Self-pay | Admitting: Surgery

## 2020-01-01 ENCOUNTER — Ambulatory Visit
Admission: RE | Admit: 2020-01-01 | Discharge: 2020-01-01 | Disposition: A | Discharge status: Home / Self Care | Payer: Medicaid Other | Attending: Surgery | Admitting: Surgery

## 2020-01-01 DIAGNOSIS — K4090 Unilateral inguinal hernia, without obstruction or gangrene, not specified as recurrent: Secondary | ICD-10-CM | POA: Insufficient documentation

## 2020-01-01 DIAGNOSIS — Z79899 Other long term (current) drug therapy: Secondary | ICD-10-CM | POA: Insufficient documentation

## 2020-01-01 DIAGNOSIS — K219 Gastro-esophageal reflux disease without esophagitis: Secondary | ICD-10-CM | POA: Diagnosis not present

## 2020-01-01 DIAGNOSIS — E039 Hypothyroidism, unspecified: Secondary | ICD-10-CM | POA: Diagnosis not present

## 2020-01-01 DIAGNOSIS — N183 Chronic kidney disease, stage 3 unspecified: Secondary | ICD-10-CM | POA: Diagnosis not present

## 2020-01-01 DIAGNOSIS — Z8673 Personal history of transient ischemic attack (TIA), and cerebral infarction without residual deficits: Secondary | ICD-10-CM | POA: Diagnosis not present

## 2020-01-01 DIAGNOSIS — I129 Hypertensive chronic kidney disease with stage 1 through stage 4 chronic kidney disease, or unspecified chronic kidney disease: Secondary | ICD-10-CM | POA: Insufficient documentation

## 2020-01-01 DIAGNOSIS — K429 Umbilical hernia without obstruction or gangrene: Secondary | ICD-10-CM | POA: Diagnosis not present

## 2020-01-01 DIAGNOSIS — I130 Hypertensive heart and chronic kidney disease with heart failure and stage 1 through stage 4 chronic kidney disease, or unspecified chronic kidney disease: Secondary | ICD-10-CM | POA: Diagnosis not present

## 2020-01-01 DIAGNOSIS — I5032 Chronic diastolic (congestive) heart failure: Secondary | ICD-10-CM | POA: Diagnosis not present

## 2020-01-01 DIAGNOSIS — E785 Hyperlipidemia, unspecified: Secondary | ICD-10-CM | POA: Diagnosis not present

## 2020-01-01 DIAGNOSIS — I2510 Atherosclerotic heart disease of native coronary artery without angina pectoris: Secondary | ICD-10-CM | POA: Diagnosis not present

## 2020-01-01 HISTORY — PX: XI ROBOTIC ASSISTED INGUINAL HERNIA REPAIR WITH MESH: SHX6706

## 2020-01-01 HISTORY — PX: UMBILICAL HERNIA REPAIR: SHX196

## 2020-01-01 SURGERY — REPAIR, HERNIA, INGUINAL, ROBOT-ASSISTED, LAPAROSCOPIC, USING MESH
Anesthesia: General

## 2020-01-01 MED ORDER — CHLORHEXIDINE GLUCONATE CLOTH 2 % EX PADS: 6.0000 | MEDICATED_PAD | Freq: Once | CUTANEOUS | Status: DC

## 2020-01-01 MED ORDER — PHENYLEPHRINE HCL (PRESSORS) 10 MG/ML IV SOLN
INTRAVENOUS | Status: DC | PRN
  Administered 2020-01-01: 200 ug via INTRAVENOUS
  Administered 2020-01-01: 100 ug via INTRAVENOUS
  Administered 2020-01-01: 200 ug via INTRAVENOUS

## 2020-01-01 MED ORDER — ACETAMINOPHEN 500 MG PO TABS
ORAL_TABLET | ORAL | Status: AC
  Administered 2020-01-01: 1000 mg via ORAL
  Filled 2020-01-01: qty 2

## 2020-01-01 MED ORDER — ACETAMINOPHEN 500 MG PO TABS: 1000.0000 mg | ORAL_TABLET | Freq: Four times a day (QID) | ORAL | Status: DC | PRN

## 2020-01-01 MED ORDER — DEXAMETHASONE SODIUM PHOSPHATE 10 MG/ML IJ SOLN
INTRAMUSCULAR | Status: DC | PRN
  Administered 2020-01-01: 10 mg via INTRAVENOUS

## 2020-01-01 MED ORDER — FENTANYL CITRATE (PF) 100 MCG/2ML IJ SOLN
INTRAMUSCULAR | Status: AC
  Filled 2020-01-01: qty 2

## 2020-01-01 MED ORDER — CHLORHEXIDINE GLUCONATE 0.12 % MT SOLN
OROMUCOSAL | Status: AC
  Administered 2020-01-01: 15 mL via OROMUCOSAL
  Filled 2020-01-01: qty 15

## 2020-01-01 MED ORDER — CEFAZOLIN SODIUM-DEXTROSE 2-4 GM/100ML-% IV SOLN
INTRAVENOUS | Status: AC
  Filled 2020-01-01: qty 100

## 2020-01-01 MED ORDER — ONDANSETRON HCL 4 MG/2ML IJ SOLN
4.0000 mg | Freq: Once | INTRAMUSCULAR | Status: AC | PRN
  Administered 2020-01-01: 4 mg via INTRAVENOUS

## 2020-01-01 MED ORDER — GABAPENTIN 100 MG PO CAPS
ORAL_CAPSULE | ORAL | Status: AC
  Administered 2020-01-01: 100 mg via ORAL
  Filled 2020-01-01: qty 1

## 2020-01-01 MED ORDER — SUGAMMADEX SODIUM 200 MG/2ML IV SOLN
INTRAVENOUS | Status: DC | PRN
  Administered 2020-01-01: 200 mg via INTRAVENOUS

## 2020-01-01 MED ORDER — FENTANYL CITRATE (PF) 100 MCG/2ML IJ SOLN
INTRAMUSCULAR | Status: DC | PRN
  Administered 2020-01-01 (×4): 50 ug via INTRAVENOUS

## 2020-01-01 MED ORDER — LACTATED RINGERS IV SOLN: INTRAVENOUS | Status: DC | PRN

## 2020-01-01 MED ORDER — BUPIVACAINE LIPOSOME 1.3 % IJ SUSP
INTRAMUSCULAR | Status: AC
  Filled 2020-01-01: qty 20

## 2020-01-01 MED ORDER — BUPIVACAINE-EPINEPHRINE (PF) 0.5% -1:200000 IJ SOLN
INTRAMUSCULAR | Status: DC | PRN
  Administered 2020-01-01: 20 mL

## 2020-01-01 MED ORDER — PROPOFOL 10 MG/ML IV BOLUS
INTRAVENOUS | Status: AC
  Filled 2020-01-01: qty 20

## 2020-01-01 MED ORDER — LIDOCAINE HCL (CARDIAC) PF 100 MG/5ML IV SOSY
PREFILLED_SYRINGE | INTRAVENOUS | Status: DC | PRN
  Administered 2020-01-01: 100 mg via INTRAVENOUS

## 2020-01-01 MED ORDER — PROPOFOL 10 MG/ML IV BOLUS
INTRAVENOUS | Status: DC | PRN
  Administered 2020-01-01: 50 mg via INTRAVENOUS
  Administered 2020-01-01: 150 mg via INTRAVENOUS

## 2020-01-01 MED ORDER — ROCURONIUM BROMIDE 100 MG/10ML IV SOLN
INTRAVENOUS | Status: DC | PRN
  Administered 2020-01-01: 20 mg via INTRAVENOUS
  Administered 2020-01-01: 60 mg via INTRAVENOUS

## 2020-01-01 MED ORDER — SODIUM CHLORIDE 0.9 % IV SOLN
INTRAVENOUS | Status: DC | PRN
  Administered 2020-01-01: 20 ug/min via INTRAVENOUS

## 2020-01-01 MED ORDER — BUPIVACAINE-EPINEPHRINE (PF) 0.5% -1:200000 IJ SOLN
INTRAMUSCULAR | Status: AC
  Filled 2020-01-01: qty 90

## 2020-01-01 MED ORDER — OXYCODONE HCL 5 MG PO TABS: 5.0000 mg | ORAL_TABLET | ORAL | 0 refills | Status: DC | PRN

## 2020-01-01 MED ORDER — BUPIVACAINE LIPOSOME 1.3 % IJ SUSP
INTRAMUSCULAR | Status: DC | PRN
  Administered 2020-01-01: 20 mL

## 2020-01-01 MED ORDER — FENTANYL CITRATE (PF) 100 MCG/2ML IJ SOLN: 25.0000 ug | INTRAMUSCULAR | Status: DC | PRN

## 2020-01-01 MED ORDER — EPHEDRINE SULFATE 50 MG/ML IJ SOLN
INTRAMUSCULAR | Status: DC | PRN
  Administered 2020-01-01 (×2): 10 mg via INTRAVENOUS

## 2020-01-01 SURGICAL SUPPLY — 66 items
ADH SKN CLS APL DERMABOND .7 (GAUZE/BANDAGES/DRESSINGS) ×2
APL PRP STRL LF DISP 70% ISPRP (MISCELLANEOUS) ×2
BLADE SURG 15 STRL LF DISP TIS (BLADE) ×2 IMPLANT
BLADE SURG 15 STRL SS (BLADE) ×4
CANISTER SUCT 1200ML W/VALVE (MISCELLANEOUS) IMPLANT
CANNULA REDUC XI 12-8 STAPL (CANNULA) ×1
CANNULA REDUC XI 12-8MM STAPL (CANNULA) ×1
CANNULA REDUCER 12-8 DVNC XI (CANNULA) ×2 IMPLANT
CHLORAPREP W/TINT 26 (MISCELLANEOUS) ×4 IMPLANT
COVER TIP SHEARS 8 DVNC (MISCELLANEOUS) ×2 IMPLANT
COVER TIP SHEARS 8MM DA VINCI (MISCELLANEOUS) ×2
COVER WAND RF STERILE (DRAPES) ×4 IMPLANT
DEFOGGER SCOPE WARMER CLEARIFY (MISCELLANEOUS) ×4 IMPLANT
DERMABOND ADVANCED (GAUZE/BANDAGES/DRESSINGS) ×2
DERMABOND ADVANCED .7 DNX12 (GAUZE/BANDAGES/DRESSINGS) ×2 IMPLANT
DRAPE ARM DVNC X/XI (DISPOSABLE) ×6 IMPLANT
DRAPE COLUMN DVNC XI (DISPOSABLE) ×2 IMPLANT
DRAPE DA VINCI XI ARM (DISPOSABLE) ×6
DRAPE DA VINCI XI COLUMN (DISPOSABLE) ×2
DRAPE LAPAROTOMY 77X122 PED (DRAPES) ×4 IMPLANT
ELECT CAUTERY BLADE 6.4 (BLADE) ×4 IMPLANT
ELECT REM PT RETURN 9FT ADLT (ELECTROSURGICAL) ×4
ELECTRODE REM PT RTRN 9FT ADLT (ELECTROSURGICAL) ×2 IMPLANT
GLOVE SURG SYN 7.0 (GLOVE) ×12 IMPLANT
GLOVE SURG SYN 7.5  E (GLOVE) ×6
GLOVE SURG SYN 7.5 E (GLOVE) ×6 IMPLANT
GOWN STRL REUS W/ TWL LRG LVL3 (GOWN DISPOSABLE) ×8 IMPLANT
GOWN STRL REUS W/TWL LRG LVL3 (GOWN DISPOSABLE) ×16
IRRIGATION STRYKERFLOW (MISCELLANEOUS) IMPLANT
IRRIGATOR STRYKERFLOW (MISCELLANEOUS)
IV NS 1000ML (IV SOLUTION)
IV NS 1000ML BAXH (IV SOLUTION) IMPLANT
KIT PINK PAD W/HEAD ARE REST (MISCELLANEOUS) ×4
KIT PINK PAD W/HEAD ARM REST (MISCELLANEOUS) ×2 IMPLANT
LABEL OR SOLS (LABEL) ×4 IMPLANT
MANIFOLD NEPTUNE II (INSTRUMENTS) ×4 IMPLANT
MESH 3DMAX 4X6 LT LRG (Mesh General) ×2 IMPLANT
MESH 3DMAX MID 4X6 LT LRG (Mesh General) ×2 IMPLANT
NEEDLE HYPO 22GX1.5 SAFETY (NEEDLE) ×4 IMPLANT
NEEDLE INSUFFLATION 14GA 120MM (NEEDLE) ×4 IMPLANT
NS IRRIG 500ML POUR BTL (IV SOLUTION) ×4 IMPLANT
OBTURATOR OPTICAL STANDARD 8MM (TROCAR) ×2
OBTURATOR OPTICAL STND 8 DVNC (TROCAR) ×2
OBTURATOR OPTICALSTD 8 DVNC (TROCAR) ×2 IMPLANT
PACK BASIN MINOR (MISCELLANEOUS) ×4 IMPLANT
PACK LAP CHOLECYSTECTOMY (MISCELLANEOUS) ×4 IMPLANT
PENCIL ELECTRO HAND CTR (MISCELLANEOUS) ×4 IMPLANT
SEAL CANN UNIV 5-8 DVNC XI (MISCELLANEOUS) ×6 IMPLANT
SEAL XI 5MM-8MM UNIVERSAL (MISCELLANEOUS) ×6
SET TUBE SMOKE EVAC HIGH FLOW (TUBING) ×4 IMPLANT
SOLUTION ELECTROLUBE (MISCELLANEOUS) ×4 IMPLANT
SPONGE LAP 18X18 RF (DISPOSABLE) ×4 IMPLANT
STAPLER CANNULA SEAL DVNC XI (STAPLE) ×2 IMPLANT
STAPLER CANNULA SEAL XI (STAPLE) ×2
SUT ETHIBOND 0 MO6 C/R (SUTURE) ×4 IMPLANT
SUT MNCRL AB 4-0 PS2 18 (SUTURE) ×4 IMPLANT
SUT VIC AB 2-0 SH 27 (SUTURE) ×8
SUT VIC AB 2-0 SH 27XBRD (SUTURE) ×4 IMPLANT
SUT VIC AB 3-0 SH 27 (SUTURE) ×4
SUT VIC AB 3-0 SH 27X BRD (SUTURE) ×2 IMPLANT
SUT VICRYL 0 AB UR-6 (SUTURE) ×8 IMPLANT
SUT VLOC 90 S/L VL9 GS22 (SUTURE) ×4 IMPLANT
SYR 20ML LL LF (SYRINGE) ×4 IMPLANT
SYR BULB IRRIG 60ML STRL (SYRINGE) ×4 IMPLANT
TAPE TRANSPORE STRL 2 31045 (GAUZE/BANDAGES/DRESSINGS) ×4 IMPLANT
TROCAR BALLN GELPORT 12X130M (ENDOMECHANICALS) ×4 IMPLANT

## 2020-01-01 NOTE — Op Note (Signed)
Procedure Date:  01/01/2020  Pre-operative Diagnosis:  Left inguinal hernia and umbilical hernia  Post-operative Diagnosis:  Left inguinal hernia and umbilical hernia  Procedure: 1.  Robotic assisted Left Inguinal Hernia Repair 2.  Creation of Left Posterior Rectus-Transversalis Fascia Advancment Flap for Coverage of Pelvic Wound (200 cm) 3.  Open umbilical hernia repair.  Surgeon:  Howie Ill, MD  Anesthesia:  General endotracheal  Estimated Blood Loss:  10 ml  Specimens:  None  Complications:  None  Indications for Procedure:  This is a 84 y.o. male who presents with a left inguinal hernia and an umbilical hernia.  The options of surgery versus observation were reviewed with the patient and/or family. The risks of bleeding, abscess or infection, recurrence of symptoms, potential for an open procedure, injury to surrounding structures, and chronic pain were all discussed with the patient and was he willing to proceed.  We have planned this transabdominal procedure with the creation of left peritoneal flap based on the posterior rectus sheath and transversalis fascia in order to fully cover the mesh, creating a natural tisssue barrier for the bowel and peritoneal cavity.  Description of Procedure: The patient was correctly identified in the preoperative area and brought into the operating room.  The patient was placed supine with VTE prophylaxis in place.  Appropriate time-outs were performed.  Anesthesia was induced and the patient was intubated.  Appropriate antibiotics were infused.  The abdomen was prepped and draped in a sterile fashion. A supraumbilical incision was made. Cautery was used to dissect along the umbilical stalk.  The stalk was separated from the fascia using cautery, exposing a 1.5 cm umbilical hernia defect.  The fascial edges were cleaned and a 12 mm trocar was inserted.  Pneumoperitoneum was obtained with appropriate opening pressures.  A Veress needle was  used to start dissecting the peritoneal flap.  Two 8-mm robotic ports were placed in the right and left lateral positions under direct visualization.  A left 3D Max Mid Bard Mesh, a 2-0 Vicryl, and 2-0 vloc suture were placed through the umbilical port under direct visualization.  The [employer] platform was docked onto the patient, the camera was inserted and targeted, and the instruments were placed under direct visualization.  Both inguinal regions were inspected for hernias and it was confirmed that the patient had a left inguinal hernia.  Using electocautery, the peritoneal and posterior rectus tissue flap was created.  The peritoneum on the left side was scored from the median umbilical ligament laterally towards the ASIS.  The flap was mobilized using robotic scissors and the bipolar instruments, creating a plane along the posterior rectus sheath and transversalis fascia down to the pubic tubercle medially. It was then further mobilized laterally across the inguinal canal and femoral vessels and onto the psoas muscle. The inferior epigastric vessels were identified and preserved. This created a posterior rectus and peritoneal flap measuring roughly 17 cm x 12 cm.  The hernia sac and contents were reduced preserving all structures.  A left large Bard 3D Max Mid mesh was placed with good overlap along all the potential hernia defects and secured in place with 2-0 Vicryl along the medial, superomedial, and superolateral aspects.  Then, the peritoneal flap was advanced over the mesh and carried over to close the defect. A running 2-0 V lock suture was used to approximate the edge of the flap onto the peritoneum.  All needles were removed under direct visualization.  The 8- mm ports were removed  under direct visualization and the 12 mm trocar was removed.  The umbilical hernia defect was closed using 0 vicryl sutures.  Local anesthetic was infused in all incisions as well as a left ilioinguinal block.  The  umbilical stalk was reattached to the fascia using 2-0 Vicryl.  The umbilical incision was then closed with 3-0 Vicryl and 4-0 Monocryl.  The remaining port incisions were closed with 4-0 Monocryl.  The wounds were cleaned and sealed with DermaBond.  Patient was emerged from anesthesia and extubated and brought to the recovery room for further management.  The patient tolerated the procedure well and all counts were correct at the end of the case.   Howie Ill, MD

## 2020-01-01 NOTE — Anesthesia Procedure Notes (Signed)
Procedure Name: Intubation Date/Time: 01/01/2020 10:18 AM Performed by: Debe Coder, CRNA Pre-anesthesia Checklist: Patient identified, Emergency Drugs available, Suction available and Patient being monitored Patient Re-evaluated:Patient Re-evaluated prior to induction Oxygen Delivery Method: Circle system utilized Preoxygenation: Pre-oxygenation with 100% oxygen Induction Type: IV induction Ventilation: Mask ventilation without difficulty Laryngoscope Size: Mac and 4 Grade View: Grade I Tube type: Oral Tube size: 7.5 mm Number of attempts: 1 Airway Equipment and Method: Stylet and Oral airway Placement Confirmation: ETT inserted through vocal cords under direct vision,  positive ETCO2 and breath sounds checked- equal and bilateral Tube secured with: Tape Dental Injury: Teeth and Oropharynx as per pre-operative assessment

## 2020-01-01 NOTE — Anesthesia Preprocedure Evaluation (Signed)
Anesthesia Evaluation  Patient identified by MRN, date of birth, ID band Patient awake    History of Anesthesia Complications Negative for: history of anesthetic complications  Airway Mallampati: III       Dental  (+) Edentulous Upper, Edentulous Lower   Pulmonary neg sleep apnea, neg COPD,           Cardiovascular hypertension, Pt. on medications +CHF  (-) Past MI (-) dysrhythmias (-) Valvular Problems/Murmurs     Neuro/Psych neg Seizures    GI/Hepatic Neg liver ROS, GERD  Medicated and Controlled,  Endo/Other  neg diabetesHypothyroidism   Renal/GU Renal InsufficiencyRenal disease     Musculoskeletal   Abdominal   Peds  Hematology   Anesthesia Other Findings   Reproductive/Obstetrics                             Anesthesia Physical Anesthesia Plan  ASA: III  Anesthesia Plan: General   Post-op Pain Management:    Induction: Intravenous  PONV Risk Score and Plan: 2 and Ondansetron and Dexamethasone  Airway Management Planned: Oral ETT  Additional Equipment:   Intra-op Plan:   Post-operative Plan:   Informed Consent: I have reviewed the patients History and Physical, chart, labs and discussed the procedure including the risks, benefits and alternatives for the proposed anesthesia with the patient or authorized representative who has indicated his/her understanding and acceptance.       Plan Discussed with:   Anesthesia Plan Comments:         Anesthesia Quick Evaluation

## 2020-01-01 NOTE — H&P (Signed)
01/01/20  History of Present Illness: Marcin Holte is a 84 y.o. male presenting for follow-up of right-sided abdominal pain as well as left inguinal hernia and umbilical hernia.  The patient was last seen in the office on 11/21/2019.  CT scan of the abdomen pelvis was obtained to evaluate the patient's anatomy and check exactly what hernias he had.  CT scan was done on 11/27/2019 which showed a left inguinal hernia as well as an umbilical hernia but there was no findings on the right side.  Patient reports that he still has some discomfort both on the right side and the left side around the umbilicus.  He saw [name] with cardiology on 11/28/2019 and he has been set up for a stress test as well as echocardiogram.  Past Medical History:     Past Medical History:  Diagnosis Date  . Arthritis Left knee  . Chronic kidney disease    STAGE 3  . Coronary artery disease   . GERD (gastroesophageal reflux disease)   . Gout   . History of kidney problems   . Hyperlipemia 08/18/2014  . Hyperlipidemia   . Hypertension   . TIA (transient ischemic attack)   . Total knee replacement status 10/25/2016     Past Surgical History:      Past Surgical History:  Procedure Laterality Date  . BUNIONECTOMY Bilateral   . EYE SURGERY Bilateral    Cataractt Extraction with IOL  . TOTAL KNEE ARTHROPLASTY Left 10/25/2016   Procedure: TOTAL KNEE ARTHROPLASTY;  Surgeon: Deeann Saint, MD;  Location: [facility] ORS;  Service: Orthopedics;  Laterality: Left;    Home Medications:        Prior to Admission medications   Medication Sig Start Date End Date Taking? Authorizing Provider  allopurinol (ZYLOPRIM) 100 MG tablet Take 1 tablet (100 mg total) by mouth daily. 11/03/19  Yes Cathlean Marseilles A, NP  amLODipine (NORVASC) 10 MG tablet Take 1 tablet (10 mg total) by mouth daily. 11/03/19  Yes Valentino Nose, NP  atorvastatin (LIPITOR) 20 MG tablet Take 1 tablet (20 mg total) by mouth daily.  11/03/19  Yes Valentino Nose, NP  carvedilol (COREG) 6.25 MG tablet Take 1 tablet (6.25 mg total) by mouth 2 (two) times daily with a meal. 04/11/19  Yes Dunn, Raymon Mutton, PA-C  clopidogrel (PLAVIX) 75 MG tablet TAKE 1 TABLET BY MOUTH EVERY DAY 11/05/19  Yes Dunn, Raymon Mutton, PA-C  furosemide (LASIX) 40 MG tablet Take 40 mg by mouth. Takes 1-2 tablets daily as needed for swelling, weight gain, shortness of breath.   Yes [provider]  isosorbide mononitrate (IMDUR) 60 MG 24 hr tablet Take 1.5 tablets (90 mg total) by mouth daily. 09/17/18  Yes Dunn, Raymon Mutton, PA-C  levothyroxine (SYNTHROID) 25 MCG tablet Take 1 tablet (25 mcg total) by mouth daily before breakfast. 11/03/19  Yes Cathlean Marseilles A, NP  nitroGLYCERIN (NITROSTAT) 0.4 MG SL tablet Place 1 tablet (0.4 mg total) under the tongue every 5 (five) minutes as needed for chest pain (chest pain). Maximum of 3 doses. 04/11/19  Yes Dunn, Raymon Mutton, PA-C  pantoprazole (PROTONIX) 40 MG tablet Take 1 tablet (40 mg total) by mouth daily. 11/03/19  Yes Valentino Nose, NP    Allergies:      Allergies  Allergen Reactions  . Aspirin Cough and Other (See Comments)  . Lisinopril Other (See Comments)    Throat itching Throat itching Throat itching    Review of Systems:  Review of Systems  Constitutional: Negative for chills and fever.  Respiratory: Negative for shortness of breath.   Cardiovascular: Negative for chest pain.  Gastrointestinal: Positive for abdominal pain and constipation. Negative for diarrhea, nausea and vomiting.    Physical Exam BP (!) 159/87   Pulse 71   Temp 97.6 F (36.4 C)   Resp 12   Wt 191 lb 12.8 oz (87 kg)   SpO2 99%   BMI 30.04 kg/m  CONSTITUTIONAL: No acute distress HEENT:  Normocephalic, atraumatic, extraocular motion intact. RESPIRATORY: Normal respiratory effort without pathologic use of accessory muscles. CARDIOVASCULAR: Regular rhythm and rate GI: The abdomen is soft, nondistended,  with some discomfort to palpation in the periumbilical area both right and left sides.  Patient has a reducible left inguinal hernia and a reducible umbilical hernia.  He does have a small component of diastases.  NEUROLOGIC:  Motor and sensation is grossly normal.  Cranial nerves are grossly intact. PSYCH:  Alert and oriented to person, place and time. Affect is normal.  Labs/Imaging: CT abd/pelvis 11/27/19: IMPRESSION: 1. Fat containing left inguinal and umbilical hernias. No bowel herniation. 2. Enlarged prostate. 3. Asymmetric wall thickening along the left anterolateral aspect of the urinary bladder. Cystoscopy may be useful for further evaluation. 4. Bilateral renal hypodensities, the majority of which are compatible with simple cysts. Probable minimally complex hyperdense cyst right kidney as above. 5. Diverticulosis without diverticulitis.  Assessment and Plan: This is a 84 y.o. male with a left inguinal hernia.  Umbilical hernia.  -Discussed with patient that there were no findings seen on the right side.  Particularly there is no Spigelian hernia or any bulging on the right side.  A potential issue is that given constipation issues that he has, this could be causing some discomfort in the right abdomen.  However his pain seems to be more periumbilical radiating more towards the right and left sides which could be related to his umbilical hernia itself. -Discussed with patient that given the findings, our plan will be to do a robotic assisted left inguinal hernia repair and open umbilical hernia repair.  Discussed with him the procedure at length including the risks of bleeding, infection, and injury to surrounding structures, and he is willing to proceed.  Henrene Dodge, MD

## 2020-01-01 NOTE — Transfer of Care (Signed)
Immediate Anesthesia Transfer of Care Note  Patient: Gerald Hodges  Procedure(s) Performed: XI ROBOTIC ASSISTED INGUINAL HERNIA REPAIR WITH MESH (Left ) HERNIA REPAIR UMBILICAL ADULT (N/A )  Patient Location: PACU  Anesthesia Type:General  Level of Consciousness: awake, alert , drowsy and patient cooperative  Airway & Oxygen Therapy: Patient Spontanous Breathing and Patient connected to face mask oxygen  Post-op Assessment: Report given to RN and Post -op Vital signs reviewed and stable  Post vital signs: Reviewed and stable  Last Vitals:  Vitals Value Taken Time  BP 116/85 01/01/20 1254  Temp    Pulse 78 01/01/20 1259  Resp 16 01/01/20 1259  SpO2 93 % 01/01/20 1259    Last Pain:  Vitals:   01/01/20 1254  TempSrc:   PainSc: Asleep         Complications: No complications documented.

## 2020-01-01 NOTE — OR Nursing (Signed)
D/C has been pending transportation.

## 2020-01-01 NOTE — Discharge Instructions (Addendum)
AMBULATORY SURGERY  DISCHARGE INSTRUCTIONS   1) The drugs that you were given will stay in your system until tomorrow so for the next 24 hours you should not:  A) Drive an automobile B) Make any legal decisions C) Drink any alcoholic beverage   2) You may resume regular meals tomorrow.  Today it is better to start with liquids and gradually work up to solid foods.  You may eat anything you prefer, but it is better to start with liquids, then soup and crackers, and gradually work up to solid foods.   3) Please notify your doctor immediately if you have any unusual bleeding, trouble breathing, redness and pain at the surgery site, drainage, fever, or pain not relieved by medication.    4) Additional Instructions:        Please contact your physician with any problems or Same Day Surgery at [phone], Monday through Friday 6 am to 4 pm, or [place] at [facility] number at [phone].  Hernia, en adultos Hernia, Adult     Una hernia es la protrusin de un rgano o un tejido a travs de un punto dbil en los msculos del abdomen (pared abdominal). Las hernias se desarrollan con ms frecuencia cerca del ombligo o en la zona donde se unen las piernas con el abdomen inferior (ingle). Los tipos ms comunes de hernias incluyen:  Hernia incisional. Este tipo sobresale a travs de una cicatriz de una ciruga abdominal.  Hernia umbilical. Este tipo se desarrolla cerca del ombligo.  Hernia inguinal. Este tipo de hernia aparece en la ingle o en el escroto.  Hernia crural. Este tipo de hernia aparece por debajo de la ingle en la regin superior del muslo.  Hernia de hiato. Se produce cuando parte del estmago se desliza por arriba del msculo que separa el abdomen del trax (diafragma). Cules son las causas? Esta afeccin puede ser causada por lo siguiente:  Levantar peso excesivo.  Toser [employer].  Hacer un gran esfuerzo para defecar. El estreimiento  puede llevar a Comptroller un gran esfuerzo.  Una incisin realizada durante una ciruga abdominal.  Un problema fsico presente desde el nacimiento (defecto congnito).  Tener exceso de [place] u obesidad.  Fumar.  Exceso de lquido en el abdomen.  Falta de descenso de los [employer]. Cules son los signos o sntomas? El sntoma principal es un bulto color piel y redondeado en la zona de la hernia. Sin embargo, el bulto no siempre es visible. Puede hacerse ms grande o ms visible al toser o Herbalist esfuerzo (como cuando se levanta un objeto pesado). Una hernia que se puede empujar hacia Photographer (es reducible) rara vez causa dolor. Una hernia que no se puede empujar IT consultant (encarcelada) puede perder irrigacin sangunea (estrangularse). Una hernia que est encarcelada puede causar lo siguiente:  Dolor.  Grant Ruts.  Nuseas y vmitos.  Hinchazn.  Estreimiento. Cmo se diagnostica? La hernia puede diagnosticarse en funcin de lo siguiente:  Los sntomas y antecedentes mdicos.  Un examen fsico. El mdico puede pedirle que tosa o que se mueva de ciertas maneras para controlar si la hernia se hace visible.  Pruebas de diagnstico por imgenes, por ejemplo: ? Radiografas. ? Ecografa. ? Exploracin por tomografa computarizada (TC). Cmo se trata? Es posible que una hernia pequea e indolora no necesite tratamiento. Una hernia grande o dolorosa puede tratarse con [place]. Para tratar las hernias inguinales, se puede recurrir a  una ciruga para Teacher, early years/pre. Las hernias estranguladas siempre se tratan con ciruga porque la falta de irrigacin sangunea al rgano o al tejido atrapados puede causar su muerte. La ciruga para tratar una hernia incluye volver a Scientist, product/process development bulto en su lugar y reparar la zona dbil del msculo o de la pared abdominal. Siga estas indicaciones en su casa: Actividad  No  haga esfuerzos.  No levante ningn objeto que pese ms de 10libras (4,5kg) o el lmite de peso que le haya indicado el mdico hasta que l le diga que es seguro [place].  Al levantar objetos pesados, use los msculos de las piernas, no los de la espalda. Prevencin del estreimiento  Tome medidas para Chief Strategy Officer. El estreimiento obliga a Education officer, environmental esfuerzos para Advertising copywriter, los cuales pueden agravar una hernia o causar la rotura de la reparacin. El mdico podra recomendarle que haga lo siguiente: ? Beba suficiente lquido para mantener la orina de color amarillo plido. ? Consuma alimentos ricos en fibra, como frutas y verduras frescas, cereales integrales y frijoles. ? Limite el consumo de alimentos ricos en grasa y azcares procesados, como alimentos fritos o dulces. ? Tome medicamentos recetados o de venta libre para el estreimiento. Instrucciones generales  Cuando tosa, hgalo con suavidad.  Puede intentar colocar la hernia en su lugar ejerciendo sobre esta una presin muy suave mientras est acostado. No intente forzar el bulto hacia adentro nuevamente si este no entra fcilmente.  Si tiene sobrepeso, trabaje con el mdico para adelgazar sin riegos.  No consuma ningn producto que contenga nicotina o tabaco, como cigarrillos y Administrator, Civil Service. Si necesita ayuda para dejar de fumar, consulte al [employer].  Si tiene programada la reparacin de una hernia, controle la hernia para detectar cualquier cambio en la forma, el tamao o el color. Informe al mdico sobre cualquier cambio o sntoma nuevo.  Tome los medicamentos de venta libre y los recetados solamente como se lo haya indicado el mdico.  Oceanographer a todas las visitas de control como se lo haya indicado el mdico. Esto es importante. Comunquese con un mdico si:  Hay dolor, hinchazn o enrojecimiento alrededor de la hernia.  Presenta signos de estreimiento, como los siguientes: ? Defeca menos que lo  normal durante una semana. ? Tiene dificultades para defecar. ? Tiene las heces secas y duras, o ms grandes que lo normal. Solicite ayuda de inmediato si:  Tiene fiebre.  Tiene un dolor abdominal que empeora.  Siente nuseas o vomita.  No puede colocar la hernia en su lugar ejerciendo sobre esta una presin muy suave mientras est acostado. No intente forzar el bulto hacia adentro nuevamente si este no entra fcilmente.  La hernia: ? Cambia de forma, tamao o color. ? Est dura al tacto o causa dolor a la palpacin. Estos sntomas pueden representar un problema grave que constituye Radio broadcast assistant. No espere hasta que los sntomas desaparezcan. Solicite atencin mdica de inmediato. Comunquese con el servicio de emergencias de su localidad (911 en los [place]). Resumen  Una hernia es la protrusin de un rgano o un tejido a travs de un punto dbil en los msculos del abdomen (pared abdominal).  El sntoma principal es un bulto color piel y redondeado (protuberancia) en la zona de la hernia. Sin embargo, el bulto no siempre es visible. Puede hacerse ms grande o ms visible al toser o hace un esfuerzo (como cuando Scientist, clinical (histocompatibility and immunogenetics)).  Es posible que una hernia pequea e indolora no necesite  tratamiento. Una hernia grande o dolorosa puede tratarse con [place].  La ciruga para tratar una hernia incluye volver a Scientist, product/process development bulto en su lugar y reparar la zona dbil del abdomen. Esta informacin no tiene Theme park manager el consejo del mdico. Asegrese de hacerle al mdico cualquier pregunta que tenga. Document Revised: 05/30/2017 Document Reviewed: 01/17/2017 Elsevier Patient Education  2020 Elsevier Inc.   Hernia inguinal en los adultos Inguinal Hernia, Adult Una hernia inguinal se produce cuando la grasa o los intestinos empujan a travs de una zona dbil de un msculo donde se unen la pierna y el abdomen inferior (ingle). Esto crea un bulto. Este tipo de hernia tambin  puede aparecer en:  En el escroto, si es varn.  En los pliegues de la piel alrededor de la vagina, si es [place]. Existen tres tipos de hernias inguinales:  Hernias que se pueden empujar hacia adentro del abdomen (son reducibles). Este tipo de hernias rara vez provoca dolor.  Hernias que no son reducibles (estn encarceladas).  Hernias que no son reducibles y pierden la irrigacin sangunea (estn estranguladas). Este tipo de hernia requiere ciruga de Associate Professor. Cules son las causas? Esta afeccin se produce cuando tiene una zona dbil en los msculos o en los tejidos de la ingle. Esta zona dbil se desarrolla con el transcurso del tiempo. La hernia puede salirse por la zona dbil cuando ejerce un esfuerzo repentino [employer] abdominales inferiores, por ejemplo, en los siguientes casos:  Levanta un objeto pesado.  Hace esfuerzos para defecar. El estreimiento puede llevar a Comptroller un gran esfuerzo.  Tos. Qu incrementa el riesgo? Es ms probable que Dietitian en:  Los hombres.  Las [employer].  Personas que: ? Tiene sobrepeso. ? Realizan trabajos que requieren Theme park manager de pie durante largos perodos o levantar cargas pesadas. ? Han tenido una hernia inguinal previamente. ? Fuman o tienen una enfermedad pulmonar. Estos factores pueden causar una tos duradera (crnica). Cules son los signos o los sntomas? Los sntomas pueden depender del tamao de la hernia. Con frecuencia, una pequea hernia inguinal no tiene sntomas. Estos son algunos de los sntomas de una hernia ms grande:  Un bulto en la zona de la ingle. Este es fcil de ver cuando se encuentra de pie. Podra no ser visible si se encuentra acostado.  Dolor o ardor en la ingle. Esto podra empeorar cuando levanta un objeto, realiza un esfuerzo o tose.  Un dolor sordo o una sensacin de presin en la ingle.  Un bulto inusual en el escroto del hombre. Los  sntomas de una hernia inguinal estrangulada pueden incluir lo siguiente:  Un bulto en la ingle que es muy doloroso y sensible al tacto.  Un bulto que se torna de color rojo o prpura.  Grant Ruts, nuseas y vmitos.  Imposibilidad de defecar o eliminar gases. Cmo se diagnostica? Esta afeccin se diagnostica en funcin de los sntomas, los antecedentes mdicos y un examen fsico. El mdico puede examinar la zona de la ingle y pedirle que tosa. Cmo se trata? El tratamiento depende del tamao de la hernia y de si tiene sntomas o no. Si no tiene sntomas, el mdico podr indicarle que controle cuidadosamente la hernia y que concurra a las visitas de control. Si tiene sntomas o una hernia grande, es posible que necesite ciruga para repararla. Siga estas indicaciones en su casa: Estilo de vida  Evite levantar objetos pesados.  Intente no estar de pie [employer].  No consuma ningn producto que contenga nicotina o tabaco, como cigarrillos y Administrator, Civil Service. Si necesita ayuda para dejar de fumar, consulte al mdico.  Mantenga un peso saludable. Prevencin del estreimiento  Tome medidas para Chief Strategy Officer. El estreimiento obliga a Education officer, environmental esfuerzos para Advertising copywriter, los cuales pueden agravar una hernia o causar la rotura de la reparacin. El mdico puede recomendarle que: ? Beba suficiente lquido para mantener la orina de color amarillo plido. ? Consuma alimentos ricos en fibra, como frutas y verduras frescas, cereales integrales y frijoles. ? Limite el consumo de alimentos ricos en grasa y azcares procesados, como los alimentos fritos o dulces. ? Tome medicamentos recetados o de venta libre para el estreimiento. Instrucciones generales  Puede intentar colocar la hernia en su lugar ejerciendo sobre esta una presin muy suave mientras est acostado. No intente forzar el bulto hacia adentro nuevamente si este no entra fcilmente.  Observe si la hernia cambia  de forma, de color o de tamao. Solicite ayuda de inmediato si observa algn cambio.  Tome los medicamentos de venta libre y los recetados solamente como se lo haya indicado el mdico.  Oceanographer a todas las visitas de control como se lo haya indicado el mdico. Esto es importante. Comunquese con un mdico si:  Tiene fiebre.  Presenta nuevos sntomas.  Los sntomas empeoran. Solicite ayuda de inmediato si:  Tiene dolor en la ingle que comienza repentinamente y [place].  Tiene un bulto en la ingle que tiene las siguientes caractersticas: ? Se agranda de repente y no se encoge. ? Se vuelve rojo o prpura y causa dolor al tacto.  Si es hombre y tiene Herbalist repentino en el escroto o el tamao de este cambia de repente.  No puede volver a Electrical engineer hernia en su lugar al ejercer sobre esta una presin muy suave mientras est acostado. No intente forzar el bulto hacia adentro nuevamente si este no entra fcilmente.  Tiene nuseas o vmitos que no desaparecen.  La frecuencia cardaca est acelerada.  Tiene dificultad para defecar o eliminar gases. Estos sntomas pueden representar un problema grave que constituye Radio broadcast assistant. No espere hasta que los sntomas desaparezcan. Solicite atencin mdica de inmediato. Comunquese con el servicio de emergencias de su localidad (911 en los [place]). Resumen  Una hernia inguinal se produce cuando la grasa o los intestinos empujan a travs de una zona dbil de un msculo donde se unen la pierna y el abdomen inferior (ingle).  Esta afeccin ocurre cuando tiene una zona dbil en los msculos o en los tejidos de la ingle.  Los sntomas pueden depender del tamao de la hernia, y pueden incluir dolor o hinchazn de la ingle. Con frecuencia, una pequea hernia inguinal no tiene sntomas.  Si no tiene sntomas, es posible que no necesite tratamiento. Si tiene sntomas o una hernia grande, es posible que necesite ciruga para reparar la  hernia.  Evite levantar objetos pesados. Tambin evite estar de pie durante mucho tiempo. Esta informacin no tiene Theme park manager el consejo del mdico. Asegrese de hacerle al mdico cualquier pregunta que tenga. Document Revised: 04/22/2017 Document Reviewed: 02/14/2017 Elsevier Patient Education  2020 Elsevier Inc.   Bupivacaine Liposomal Suspension for Injection What is this medicine? BUPIVACAINE LIPOSOMAL (bue PIV a kane LIP oh som al) is an anesthetic. It causes loss of feeling in the skin or other tissues. It is used to prevent and to treat pain from some procedures. This medicine may be used for other purposes;  ask your health care provider or pharmacist if you have questions. COMMON BRAND NAME(S): EXPAREL What should I tell my health care provider before I take this medicine? They need to know if you have any of these conditions:  G6PD deficiency  heart disease  kidney disease  liver disease  low blood pressure  lung or breathing disease, like asthma  an unusual or allergic reaction to bupivacaine, other medicines, foods, dyes, or preservatives  pregnant or trying to get pregnant  breast-feeding How should I use this medicine? This medicine is for injection into the affected area. It is given by a health care professional in a hospital or clinic setting. Talk to your pediatrician regarding the use of this medicine in children. Special care may be needed. Overdosage: If you think you have taken too much of this medicine contact a poison control center or emergency room at once. NOTE: This medicine is only for you. Do not share this medicine with others. What if I miss a dose? This does not apply. What may interact with this medicine? This medicine may interact with the following medications:  acetaminophen  certain antibiotics like dapsone, nitrofurantoin, aminosalicylic acid, sulfonamides  certain medicines for seizures like phenobarbital, phenytoin,  valproic acid  chloroquine  cyclophosphamide  flutamide  hydroxyurea  ifosfamide  metoclopramide  nitric oxide  nitroglycerin  nitroprusside  nitrous oxide  other local anesthetics like lidocaine, pramoxine, tetracaine  primaquine  quinine  rasburicase  sulfasalazine This list may not describe all possible interactions. Give your health care provider a list of all the medicines, herbs, non-prescription drugs, or dietary supplements you use. Also tell them if you smoke, drink alcohol, or use illegal drugs. Some items may interact with your medicine. What should I watch for while using this medicine? Your condition will be monitored carefully while you are receiving this medicine. Be careful to avoid injury while the area is numb, and you are not aware of pain. What side effects may I notice from receiving this medicine? Side effects that you should report to your doctor or health care professional as soon as possible:  allergic reactions like skin rash, itching or hives, swelling of the face, lips, or tongue  seizures  signs and symptoms of a dangerous change in heartbeat or heart rhythm like chest pain; dizziness; fast, irregular heartbeat; palpitations; feeling faint or lightheaded; falls; breathing problems  signs and symptoms of methemoglobinemia such as pale, gray, or blue colored skin; headache; fast heartbeat; shortness of breath; feeling faint or lightheaded, falls; tiredness Side effects that usually do not require medical attention (report to your doctor or health care professional if they continue or are bothersome):  anxious  back pain  changes in taste  changes in vision  constipation  dizziness  fever  nausea, vomiting This list may not describe all possible side effects. Call your doctor for medical advice about side effects. You may report side effects to FDA at 1-800-FDA-1088. Where should I keep my medicine? This drug is given in a  hospital or clinic and will not be stored at home. NOTE: This sheet is a summary. It may not cover all possible information. If you have questions about this medicine, talk to your doctor, pharmacist, or health care provider.  2020 Elsevier/Gold Standard (2018-12-10 10:48:23)

## 2020-01-02 ENCOUNTER — Encounter: Payer: Self-pay | Admitting: Surgery

## 2020-01-03 ENCOUNTER — Other Ambulatory Visit: Payer: Self-pay | Admitting: Physician Assistant

## 2020-01-08 NOTE — Anesthesia Postprocedure Evaluation (Signed)
Anesthesia Post Note  Patient: Clay Solum  Procedure(s) Performed: XI ROBOTIC ASSISTED INGUINAL HERNIA REPAIR WITH MESH (Left ) HERNIA REPAIR UMBILICAL ADULT (N/A )  Patient location during evaluation: PACU Anesthesia Type: General Level of consciousness: awake and alert Pain management: pain level controlled Vital Signs Assessment: post-procedure vital signs reviewed and stable Respiratory status: spontaneous breathing, nonlabored ventilation and respiratory function stable Cardiovascular status: blood pressure returned to baseline and stable Postop Assessment: no apparent nausea or vomiting Anesthetic complications: no   No complications documented.   Last Vitals:  Vitals:   01/01/20 1503 01/01/20 1539  BP: (!) 143/80 (!) 141/79  Pulse: 83 84  Resp: 16 16  Temp:    SpO2: 100% 97%    Last Pain:  Vitals:   01/01/20 1539  TempSrc:   PainSc: 0-No pain                 Christia Reading

## 2020-01-16 ENCOUNTER — Encounter: Payer: Self-pay | Admitting: Surgery

## 2020-01-16 ENCOUNTER — Ambulatory Visit (INDEPENDENT_AMBULATORY_CARE_PROVIDER_SITE_OTHER): Payer: Medicaid Other | Admitting: Surgery

## 2020-01-16 ENCOUNTER — Other Ambulatory Visit: Payer: Self-pay

## 2020-01-16 VITALS — BP 163/88 | HR 73 | Temp 98.4°F | Resp 12 | Ht 65.0 in | Wt 192.0 lb

## 2020-01-16 DIAGNOSIS — K429 Umbilical hernia without obstruction or gangrene: Secondary | ICD-10-CM

## 2020-01-16 DIAGNOSIS — Z09 Encounter for follow-up examination after completed treatment for conditions other than malignant neoplasm: Secondary | ICD-10-CM

## 2020-01-16 DIAGNOSIS — K4090 Unilateral inguinal hernia, without obstruction or gangrene, not specified as recurrent: Secondary | ICD-10-CM

## 2020-01-16 NOTE — Progress Notes (Signed)
01/16/2020  HPI: Gerald Hodges is a 84 y.o. male s/p robotic left inguinal hernia and open umbilical hernia repair on 10/21.  He presents for follow up.  Reports doing well, not having much pain, with only some minimal swelling.    Vital signs: BP (!) 163/88   Pulse 73   Temp 98.4 F (36.9 C) (Oral)   Resp 12   Ht 5\' 5"  (1.651 m)   Wt 192 lb (87.1 kg)   SpO2 93%   BMI 31.95 kg/m    Physical Exam: Constitutional: No acute distress Abdomen:  Soft, non-distended, non-tender to palpation.  NO evidence of hernia recurrence.  Incisions clean, dry, intact with DermaBond.  Assessment/Plan: This is a 84 y.o. male s/p robotic assisted left inguinal hernia and open umbilical hernia repair.  --Patient doing well.  Discussed with him that the swelling will improve as everything keeps healing. --No evidence of hernia recurrence. --4 more weeks of no heavy lifting or pushing --Follow up prn.   89, MD [facility]

## 2020-01-16 NOTE — Patient Instructions (Signed)
Please call if you have questions or concerns.    GENERAL POST-OPERATIVE PATIENT INSTRUCTIONS   WOUND CARE INSTRUCTIONS:  Keep a dry clean dressing on the wound if there is drainage. The initial bandage may be removed after 24 hours.  Once the wound has quit draining you may leave it open to air.  If clothing rubs against the wound or causes irritation and the wound is not draining you may cover it with a dry dressing during the daytime.  Try to keep the wound dry and avoid ointments on the wound unless directed to do so.  If the wound becomes bright red and painful or starts to drain infected material that is not clear, please contact your physician immediately.  If the wound is mildly pink and has a thick firm ridge underneath it, this is normal, and is referred to as a healing ridge.  This will resolve over the next 4-6 weeks.  BATHING: You may shower if you have been informed of this by your surgeon. However, Please do not submerge in a tub, hot tub, or pool until incisions are completely sealed or have been told by your surgeon that you may do so.  DIET:  You may eat any foods that you can tolerate.  It is a good idea to eat a high fiber diet and take in plenty of fluids to prevent constipation.  If you do become constipated you may want to take a mild laxative or take ducolax tablets on a daily basis until your bowel habits are regular.  Constipation can be very uncomfortable, along with straining, after recent surgery.  ACTIVITY:  You are encouraged to cough and deep breath or use your incentive spirometer if you were given one, every 15-30 minutes when awake.  This will help prevent respiratory complications and low grade fevers post-operatively if you had a general anesthetic.  You may want to hug a pillow when coughing and sneezing to add additional support to the surgical area, if you had abdominal or chest surgery, which will decrease pain during these times.  You are encouraged to walk and  engage in light activity for the next two weeks.  You should not lift more than 20 pounds, until 02/06/20 as it could put you at increased risk for complications.  Twenty pounds is roughly equivalent to a plastic bag of groceries. At that time- Listen to your body when lifting, if you have pain when lifting, stop and then try again in a few days. Soreness after doing exercises or activities of daily living is normal as you get back in to your normal routine.  MEDICATIONS:  Try to take narcotic medications and anti-inflammatory medications, such as tylenol, ibuprofen, naprosyn, etc., with food.  This will minimize stomach upset from the medication.  Should you develop nausea and vomiting from the pain medication, or develop a rash, please discontinue the medication and contact your physician.  You should not drive, make important decisions, or operate machinery when taking narcotic pain medication.  SUNBLOCK Use sun block to incision area over the next year if this area will be exposed to sun. This helps decrease scarring and will allow you avoid a permanent darkened area over your incision.  QUESTIONS:  Please feel free to call our office if you have any questions, and we will be glad to assist you. [phone]

## 2020-01-23 ENCOUNTER — Other Ambulatory Visit: Payer: Self-pay

## 2020-01-23 ENCOUNTER — Other Ambulatory Visit: Payer: Self-pay | Admitting: Physician Assistant

## 2020-01-23 ENCOUNTER — Telehealth: Payer: Self-pay

## 2020-01-23 NOTE — Telephone Encounter (Signed)
Called Gerald Hodges advised of Gerald Hodges's message he wants to hold of and see Gerald Hodges in am pt scheduled 12/23 he wanted to keep this appt didn't want to see anyone else I added 20 mins to appt

## 2020-01-23 NOTE — Telephone Encounter (Signed)
*  STAT* If patient is at the pharmacy, call can be transferred to refill team.   1. Which medications need to be refilled? (please list name of each medication and dose if known) Isosorbide  2. Which pharmacy/location (including street and city if local pharmacy) is medication to be sent to? [facility]  3. Do they need a 30 day or 90 day supply? 90

## 2020-01-23 NOTE — Telephone Encounter (Signed)
Would need office visit first please, we may be able to clean ears here if he places Debrox over the counter solution in ear about 30 minutes before visit with me.

## 2020-01-23 NOTE — Telephone Encounter (Signed)
Noted  

## 2020-01-23 NOTE — Telephone Encounter (Signed)
Gerald Hodges pt's son came in to see if Jolene can refer the pt to [facility] due to pt saying that it's like wax or something coming out of his ear. He states that his dad was seen by them before but they told him that he needs a referral. Please advise.

## 2020-01-27 ENCOUNTER — Encounter: Payer: Self-pay | Admitting: Nurse Practitioner

## 2020-01-27 ENCOUNTER — Other Ambulatory Visit: Payer: Self-pay

## 2020-01-27 ENCOUNTER — Ambulatory Visit (INDEPENDENT_AMBULATORY_CARE_PROVIDER_SITE_OTHER): Payer: Medicaid Other | Admitting: Nurse Practitioner

## 2020-01-27 VITALS — BP 134/78 | HR 75 | Temp 98.2°F | Wt 193.2 lb

## 2020-01-27 DIAGNOSIS — M7989 Other specified soft tissue disorders: Secondary | ICD-10-CM

## 2020-01-27 DIAGNOSIS — H9193 Unspecified hearing loss, bilateral: Secondary | ICD-10-CM | POA: Diagnosis not present

## 2020-01-27 DIAGNOSIS — E039 Hypothyroidism, unspecified: Secondary | ICD-10-CM | POA: Diagnosis not present

## 2020-01-27 MED ORDER — FUROSEMIDE 40 MG PO TABS: 40.0000 mg | ORAL_TABLET | Freq: Every day | ORAL | 1 refills | Status: DC | PRN

## 2020-01-27 NOTE — Assessment & Plan Note (Addendum)
Chronic, ongoing.  Likely dependent edema related to salt intake.  After medication review, looks like patient is no longer taking furosemide.  We will refill furosemide and encouraged patient to check with his cardiologist about continuing this medication long-term.  Recent echocardiogram 12/2019 showed left ventricular ejection fraction of 55 to 60%, which is reassuring.  Encouraged daily use of furosemide, start wearing compression stockings, and do not add any salt to food.  Return to clinic if this persist.  Will check BNP, CMP, thyroid level today.

## 2020-01-27 NOTE — Assessment & Plan Note (Signed)
Referral placed to [facility]NT.

## 2020-01-27 NOTE — Progress Notes (Signed)
BP 134/78 (BP Location: Right Arm, Cuff Size: Normal)   Pulse 75   Temp 98.2 F (36.8 C) (Oral)   Wt 193 lb 3.2 oz (87.6 kg)   SpO2 96%   BMI 32.15 kg/m    Subjective:    Patient ID: Gerald Hodges, male    DOB: [birth year], 84 y.o.   MRN: [id number]  HPI: Gerald Hodges is a 84 y.o. male presenting with leg swelling.  Chief Complaint  Patient presents with  . Edema    pt states he has been having off and on bilateral leg swelling for the last 2 weeks   LEG SWELLING Reports swelling in his feet every day, mostly in the afternoon and after he does a lot of physical activity.  Swelling goes from the knees down.  When he rest in the afternoon, gets better.  Also gets better after sleeping all night.  Reports drinking plenty of water, does not add much salt to his diet, but does add some. Duration: years Pain: yes Severity:  moderate Quality: tingling   Location:  Whole lower leg Bilateral:  yes Onset: sudden Frequency: at night Time of day: night time Sudden unintentional leg jerking:   no Paresthesias:   yes Decreased sensation:  yes Weakness:   yes Insomnia:   yes Fatigue:   yes Alleviating factors: rest, elevation,  Aggravating factors: walking Status: worse Treatments attempted: rest,   DECREASED HEARING Son reports decreased hearing for years, has seen Dr. Jenne Campus in the past and would like referral to see Dr. Jenne Campus again.  Allergies  Allergen Reactions  . Aspirin Cough and Other (See Comments)  . Lisinopril Itching    Throat itching    Outpatient Encounter Medications as of 01/27/2020  Medication Sig Note  . acetaminophen (TYLENOL) 500 MG tablet Take 2 tablets (1,000 mg total) by mouth every 6 (six) hours as needed for mild pain or moderate pain.   Marland Kitchen allopurinol (ZYLOPRIM) 100 MG tablet Take 1 tablet (100 mg total) by mouth daily.   Marland Kitchen amLODipine (NORVASC) 10 MG tablet Take 1 tablet (10 mg total) by mouth daily.   Marland Kitchen atorvastatin  (LIPITOR) 20 MG tablet Take 1 tablet (20 mg total) by mouth daily.   . carvedilol (COREG) 6.25 MG tablet Take 1 tablet (6.25 mg total) by mouth 2 (two) times daily with a meal.   . cetirizine (ZYRTEC) 5 MG tablet Take 1 tablet (5 mg total) by mouth daily. 12/04/2019: Has not picked up yet  . clopidogrel (PLAVIX) 75 MG tablet TAKE 1 TABLET BY MOUTH EVERY DAY (Patient taking differently: Take 75 mg by mouth daily. )   . furosemide (LASIX) 40 MG tablet Take 1 tablet (40 mg total) by mouth daily as needed for edema (weight gain, shortness of breath).   . isosorbide mononitrate (IMDUR) 60 MG 24 hr tablet TAKE 1 AND 1/2 TABLET BY MOUTH EVERY DAY   . levothyroxine (SYNTHROID) 25 MCG tablet Take 1 tablet (25 mcg total) by mouth daily before breakfast.   . losartan (COZAAR) 50 MG tablet Take 50 mg by mouth daily.   . nitroGLYCERIN (NITROSTAT) 0.4 MG SL tablet Place 1 tablet (0.4 mg total) under the tongue every 5 (five) minutes as needed for chest pain (chest pain). Maximum of 3 doses.   . pantoprazole (PROTONIX) 40 MG tablet Take 1 tablet (40 mg total) by mouth daily.   . [DISCONTINUED] furosemide (LASIX) 40 MG tablet Take 40 mg by mouth daily as needed for  edema (weight gain, shortness of breath).     No facility-administered encounter medications on file as of 01/27/2020.   Patient Active Problem List   Diagnosis Date Noted  . Decreased hearing of both ears 01/27/2020  . Pruritus 12/04/2019  . Preoperative clearance 12/04/2019  . Left inguinal hernia 12/01/2019  . Abdominal pain 11/03/2019  . Umbilical hernia without obstruction and without gangrene 11/03/2019  . Renal cyst 11/03/2019  . Hypothyroidism 04/12/2019  . Dyspnea on exertion 02/15/2018  . Leg swelling 02/15/2018  . Osteoarthritis of knee 10/11/2016  . Chronic diastolic congestive heart failure (HCC) 08/02/2016  . Knee osteoarthritis 07/21/2016  . Allergic rhinitis 08/04/2015  . Stable angina (HCC) 06/28/2015  . Bunion of  unspecified foot 06/28/2015  . Gout 10/09/2014  . Essential hypertension, benign 08/18/2014  . Psoriasis 08/18/2014  . Chronic kidney disease, stage III (moderate) (HCC) 08/18/2014  . PAC (premature atrial contraction) 02/17/2014  . Chronic kidney disease 11/18/2013  . GERD (gastroesophageal reflux disease) 03/19/2013  . CAD (coronary artery disease) 03/18/2013  . Angina pectoris (HCC) 10/09/2012  . Hyperlipidemia LDL goal <70 10/06/2012   Past Medical History:  Diagnosis Date  . Anginal pain (HCC)   . Arthritis Left knee  . Chronic kidney disease    STAGE 3  . Coronary artery disease   . GERD (gastroesophageal reflux disease)   . Gout   . History of kidney problems   . Hyperlipemia 08/18/2014  . Hyperlipidemia   . Hypertension   . Hypothyroidism   . TIA (transient ischemic attack)   . Total knee replacement status 10/25/2016    Relevant past medical, surgical, family and social history reviewed and updated as indicated. Interim medical history since our last visit reviewed.  Review of Systems  Constitutional: Negative.  Negative for activity change, appetite change and fever.  HENT: Positive for hearing loss.   Eyes: Negative.   Respiratory: Negative.  Negative for cough, shortness of breath and wheezing.   Cardiovascular: Positive for leg swelling. Negative for chest pain and palpitations.  Gastrointestinal: Negative.   Skin: Negative.   Neurological: Negative.   Psychiatric/Behavioral: Negative.     Per HPI unless specifically indicated above     Objective:    BP 134/78 (BP Location: Right Arm, Cuff Size: Normal)   Pulse 75   Temp 98.2 F (36.8 C) (Oral)   Wt 193 lb 3.2 oz (87.6 kg)   SpO2 96%   BMI 32.15 kg/m   Wt Readings from Last 3 Encounters:  01/27/20 193 lb 3.2 oz (87.6 kg)  01/16/20 192 lb (87.1 kg)  01/01/20 189 lb 9.5 oz (86 kg)    Physical Exam Vitals and nursing note reviewed.  Constitutional:      General: He is not in acute distress.     Appearance: Normal appearance. He is obese. He is not toxic-appearing.  HENT:     Head: Normocephalic and atraumatic.     Right Ear: External ear normal.     Left Ear: External ear normal.  Eyes:     General: No scleral icterus.    Extraocular Movements: Extraocular movements intact.  Cardiovascular:     Rate and Rhythm: Normal rate and regular rhythm.     Heart sounds: Murmur heard.   Pulmonary:     Effort: Pulmonary effort is normal. No respiratory distress.     Breath sounds: Normal breath sounds. No wheezing, rhonchi or rales.  Abdominal:     General: Abdomen is flat. There is no  distension.     Palpations: Abdomen is soft.  Musculoskeletal:     Right lower leg: No edema.     Left lower leg: No edema.  Skin:    General: Skin is warm and dry.     Capillary Refill: Capillary refill takes less than 2 seconds.     Coloration: Skin is not jaundiced or pale.     Findings: No erythema.  Neurological:     General: No focal deficit present.     Mental Status: He is alert and oriented to person, place, and time.     Gait: Gait normal.  Psychiatric:        Mood and Affect: Mood normal.        Behavior: Behavior normal.        Thought Content: Thought content normal.        Judgment: Judgment normal.     Results for orders placed or performed during the hospital encounter of 12/30/19  SARS CORONAVIRUS 2 (TAT 6-24 HRS) Nasopharyngeal Nasopharyngeal Swab   Specimen: Nasopharyngeal Swab  Result Value Ref Range   SARS Coronavirus 2 NEGATIVE NEGATIVE      Assessment & Plan:   Problem List Items Addressed This Visit      Other   Leg swelling - Primary    Chronic, ongoing.  Likely dependent edema related to salt intake.  After medication review, looks like patient is no longer taking furosemide.  We will refill furosemide and encouraged patient to check with his cardiologist about continuing this medication long-term.  Recent echocardiogram 12/2019 showed left ventricular ejection  fraction of 55 to 60%, which is reassuring.  Encouraged daily use of furosemide, start wearing compression stockings, and do not add any salt to food.  Return to clinic if this persist.  Will check BNP, CMP, thyroid level today.      Relevant Orders   B Nat Peptide   TSH   Comprehensive metabolic panel   Decreased hearing of both ears    Referral placed to [facility].      Relevant Orders   Ambulatory referral to ENT       Follow up plan: Return for as scheduled.

## 2020-01-27 NOTE — Patient Instructions (Addendum)
Plan de alimentacin DASH DASH Eating Plan DASH es la sigla en ingls de "Enfoques Alimentarios para Detener la Hipertensin" (Dietary Approaches to Stop Hypertension). El plan de alimentacin DASH ha demostrado bajar la presin arterial elevada (hipertensin). Tambin puede reducir [employer] de diabetes tipo 2, enfermedad cardaca y accidente cerebrovascular. Este plan tambin puede ayudar a Geophysical data processor. Consejos para seguir este plan  Pautas generales  Evite ingerir ms de 2,300 mg (miligramos) de sal (sodio) por da. Si tiene hipertensin, es posible que necesite reducir la ingesta de sodio a 1,500 mg por da.  Limite el consumo de alcohol a no ms de por da si es mujer y no est [place], y por da si es hombre. Una medida equivale a 12oz ( ) de cerveza, 5oz ( ) de vino o 1oz (86ml) de bebidas alcohlicas de alta graduacin.  Trabaje con su mdico para mantener un peso saludable o perder [employer]. Pregntele cul es el peso recomendado para usted.  Realice al menos 30 minutos de ejercicio que haga que se acelere su corazn (ejercicio Magazine features editor) la [employer] de la [place]. Estas actividades pueden incluir caminar, nadar o andar en bicicleta.  Trabaje con su mdico o especialista en alimentacin y nutricin (nutricionista) para ajustar su plan alimentario a sus necesidades calricas personales. Lectura de las etiquetas de los alimentos   Verifique en las etiquetas de los alimentos, la cantidad de sodio por porcin. Elija alimentos con menos del 5 por ciento del valor diario de sodio. Generalmente, los alimentos con menos de 300 mg de sodio por porcin se encuadran dentro de este plan alimentario.  Para encontrar cereales integrales, busque la palabra "integral" como primera palabra en la lista de ingredientes. De compras  Compre productos en los que en su etiqueta diga: "bajo contenido de sodio" o "sin agregado de sal".  Compre alimentos frescos.  Evite los alimentos enlatados y comidas precocidas o congeladas. Coccin  Evite agregar sal cuando cocine. Use hierbas o aderezos sin sal, en lugar de sal de mesa o sal marina. Consulte al mdico o farmacutico antes de usar sustitutos de la sal.  No fra los alimentos. A la hora de cocinar los alimentos opte por hornearlos, hervirlos, grillarlos y asarlos a Patent attorney.  Cocine con aceites cardiosaludables, como oliva, canola, soja o girasol. Planificacin de las comidas  Consuma una dieta equilibrada, que incluya lo siguiente: ? 5o ms porciones de frutas y Warehouse manager. Trate de que la mitad del plato de cada comida sean frutas y verduras. ? Hasta 6 u 8 porciones de cereales integrales por da. ? Menos de 6 onzas de carne, aves o pescado Copy. Una porcin de 3 onzas de carne tiene casi el mismo tamao que un mazo de cartas. Un huevo equivale a 1 onza. ? Dos porciones de productos lcteos descremados por Futures trader. ? Una porcin de frutos secos, semillas o frijoles 5 veces por semana. ? Grasas cardiosaludables. Las grasas saludables llamadas cidos grasos omega-3 se encuentran en alimentos como semillas de lino y pescados de agua fra, como por ejemplo, sardinas, salmn y caballa.  Limite la cantidad que ingiere de los siguientes alimentos: ? Alimentos enlatados o envasados. ? Alimentos con alto contenido de grasa trans, como alimentos fritos. ? Alimentos con alto contenido de grasa saturada, como carne con grasa. ? Dulces, postres, bebidas azucaradas y otros alimentos con azcar agregada. ? Productos lcteos enteros.  No le agregue sal a los alimentos antes de probarlos.  Trate de  comer al menos 2 comidas vegetarianas por semana.  Consuma ms comida casera y menos de restaurante, de bufs y comida rpida.  Cuando coma en un restaurante, pida que preparen su comida con menos sal o, en lo posible, sin nada de sal. Qu alimentos se recomiendan? Los alimentos enumerados a  continuacin no constituyen Water quality scientist. Hable con el nutricionista sobre las mejores opciones alimenticias para usted. Cereales Pan de salvado o integral. Pasta de salvado o integral. Arroz integral. Avena. Quinua. Trigo burgol. Cereales integrales y con bajo contenido de [place]. Pan pita. Galletitas de [place] con bajo contenido de [place] y [place]. Tortillas de [place] integral. Verduras Verduras frescas o congeladas (crudas, al vapor, asadas o grilladas). Jugos de tomate y verduras con bajo contenido de sodio o reducidos en sodio. Salsa y pasta de tomate con bajo contenido de sodio o reducidas en sodio. Verduras enlatadas con bajo contenido de sodio o reducidas en sodio. Frutas Todas las frutas frescas, congeladas o disecadas. Frutas enlatadas en jugo natural (sin agregado de azcar). Carne y otros alimentos proteicos Pollo o pavo sin piel. Carne de pollo o de [place]. Cerdo desgrasado. Pescado y [employer]. Claras de huevo. Porotos, guisantes o lentejas secos. Frutos secos, mantequilla de frutos secos y semillas sin sal. Frijoles enlatados sin sal. Cortes de carne vacuna magra, desgrasada. Embutidos magros, con bajo contenido de [place]. Lcteos Leche descremada (1%) o descremada. Quesos sin grasa, con bajo contenido de grasa o descremados. Queso blanco o ricota sin grasa, con bajo contenido de [place]. Yogur semidescremado o descremado. Queso con bajo contenido de [place] y [place]. Grasas y [employer] untables que no contengan grasas trans. Aceite vegetal. Jerolyn Shin y aderezos para ensaladas livianos o con bajo contenido de grasas (reducidos en sodio). Aceite de canola, crtamo, oliva, soja y [place]. [place]. Condimentos y otros alimentos Hierbas. Especias. Mezclas de condimentos sin sal. Palomitas de maz y pretzels sin sal. Dulces con bajo contenido de grasas. Qu alimentos no se recomiendan? Los alimentos enumerados a continuacin no constituyen Water quality scientist. Hable con el  nutricionista sobre las mejores opciones alimenticias para usted. Cereales Productos de panificacin hechos con grasa, como medialunas, magdalenas y algunos panes. Comidas con arroz o pasta seca listas para usar. Verduras Verduras con crema o fritas. Verduras en salsa de [place]. Verduras enlatadas regulares (que no sean con bajo contenido de sodio o reducidas en sodio). Pasta y salsa de tomates enlatadas regulares (que no sean con bajo contenido de sodio o reducidas en sodio). Jugos de tomate y verduras regulares (que no sean con bajo contenido de sodio o reducidos en sodio). Pepinillos. Aceitunas. Nils Pyle Fruta enlatada en almbar liviano o espeso. Frutas cocidas en aceite. Frutas con salsa de crema o [place]. Carne y otros alimentos proteicos Cortes de carne con grasa. Costillas. Carne frita. [place]. Salchichas. Mortadela y otras carnes procesadas. Salame. Panceta. Perros calientes (hotdogs). Salchicha de cerdo. Frutos secos y semillas con sal. Frijoles enlatados con agregado de sal. Pescado enlatado o ahumado. Huevos enteros o yemas. Pollo o pavo con piel. Lcteos Leche entera o al 2%, crema y [address] y mitad crema. Queso crema entero o con toda su grasa. Yogur entero o endulzado. Quesos con toda su grasa. Sustitutos de cremas no lcteas. Coberturas batidas. Quesos para untar y quesos procesados. Grasas y [employer]. Margarina en barra. Manteca de cerdo. Materia grasa. Mantequilla clarificada. Grasa de panceta. Aceites tropicales como aceite de coco, palmiste o palma. Condimentos y otros alimentos Palomitas de maz y pretzels con sal.  Sal de cebolla, sal de ajo, sal condimentada, sal de mesa y sal marina. Salsa Worcestershire. Salsa trtara. Salsa barbacoa. Salsa teriyaki. Salsa de soja, incluso la que tiene contenido reducido de [place]. Salsa de carne. Salsas en lata y envasadas. Salsa de pescado. Salsa de [place]. Salsa rosada. Rbano picante envasado. [place]. Mostaza. Saborizantes y  tiernizantes para carne. Caldo en cubitos. Salsa picante y salsa tabasco. Escabeches envasados o ya preparados. Aderezos para tacos prefabricados o envasados. Salsas. Aderezos comunes para ensalada. Dnde encontrar ms informacin:  [employer] del [address], los Pulmones y Risk manager (National Heart, Lung, and Blood Institute): PopSteam.is  Asociacin Estadounidense del Corazn (American Heart Association): www.heart.org Resumen  El plan de alimentacin DASH ha demostrado bajar la presin arterial elevada (hipertensin). Tambin puede reducir [employer] International de diabetes tipo 2, enfermedad cardaca y accidente cerebrovascular.  Con el plan de alimentacin DASH, deber limitar el consumo de sal (sodio) a 2,300 mg por da. Si tiene hipertensin, es posible que necesite reducir la ingesta de sodio a 1,500 mg por da.  Cuando siga el plan de alimentacin DASH, trate de comer ms frutas frescas y verduras, cereales integrales, carnes magras, lcteos descremados y grasas cardiosaludables.  Trabaje con su mdico o especialista en alimentacin y nutricin (nutricionista) para ajustar su plan alimentario a sus necesidades calricas personales. Esta informacin no tiene Theme park manager el consejo del mdico. Asegrese de hacerle al mdico cualquier pregunta que tenga. Document Revised: 06/19/2016 Document Reviewed: 06/19/2016 Elsevier Patient Education  2020 Elsevier Inc.  Edema Edema  El edema es una acumulacin anormal de lquido en los tejidos del cuerpo y debajo de la piel. La hinchazn de las piernas, los pies y los tobillos es un sntoma frecuente que se hace ms probable a medida que envejece. La hinchazn tambin es frecuente en los tejidos ms blandos, como en la zona que rodea los ojos. Cuando se comprime la zona afectada, el lquido puede moverse de Nature conservation officer y es posible que quede una depresin durante unos instantes. Esta depresin se conoce como edema. Hay muchas causas posibles  de edema. Consumir grandes cantidades de sal (sodio) y estar de pie o sentado durante mucho tiempo puede causar edema en las piernas, los pies y los tobillos. Cuando hace calor, el edema puede empeorar. Entre las causas frecuentes de edema se incluyen las siguientes:  Insuficiencia cardaca.  Enfermedad heptica o renal.  Debilidad de los vasos sanguneos de las piernas.  Cncer.  Una lesin.  Embarazo.  Medicamentos.  Ser obeso.  Bajos niveles de protenas en la sangre. Generalmente, el edema es indoloro. La piel puede parecer hinchada o tener un aspecto brilloso. Siga estas indicaciones en su casa:  Cuando est sentado o acostado, mantenga la parte del cuerpo afectada (elevada) por encima del nivel del corazn.  No permanezca quieto sentado o de pie durante largos perodos.  No use ropa ajustada. No use ligas en la parte superior de las piernas.  Ejercite las piernas para Publishing rights manager. Esto ayuda a que el lquido pase nuevamente a sus vasos sanguneos, y puede ayudar a Building services engineer.  Use vendas o medias elsticas para reducir la hinchazn segn las indicaciones del mdico.  Siga una dieta con poco contenido de sal (sodio) para reducir el lquido segn las indicaciones del mdico.  Dependiendo de la causa de su hinchazn, es posible que deba limitar la cantidad de lquido que bebe (restriccin de lquido).  Tome los medicamentos de venta libre y los recetados solamente como se  lo haya indicado el mdico. Comunquese con un mdico si:  El edema no mejora con el tratamiento.  Tiene enfermedades cardacas, hepticas o renales, y observa sntomas de edema.  Aumenta de peso de Erwin repentina y sin motivo aparente. Solicite ayuda de inmediato si:  Siente falta de aire o dolor en el pecho.  No puede respirar cuando se acuesta.  Tiene dolor, enrojecimiento o calor en las zonas hinchadas.  Tiene enfermedades cardacas, hepticas o renales y  repentinamente tiene edema.  Tiene fiebre, y los sntomas empeoran repentinamente. Resumen  El edema es una acumulacin anormal de lquido en los tejidos del cuerpo y debajo de la piel.  Consumir grandes cantidades de sal (sodio) y estar de pie o sentado durante mucho tiempo puede causar edema en las piernas, los pies y los tobillos.  Cuando est sentado o acostado, mantenga la parte del cuerpo afectada (elevada) por encima del nivel del corazn. Esta informacin no tiene Theme park manager el consejo del mdico. Asegrese de hacerle al mdico cualquier pregunta que tenga. Document Revised: 10/09/2016 Document Reviewed: 10/09/2016 Elsevier Patient Education  2020 [employer].

## 2020-01-28 LAB — COMPREHENSIVE METABOLIC PANEL
ALT: 13 IU/L (ref 0–44)
AST: 16 IU/L (ref 0–40)
Albumin/Globulin Ratio: 1.9 (ref 1.2–2.2)
Albumin: 4.6 g/dL (ref 3.6–4.6)
Alkaline Phosphatase: 105 IU/L (ref 44–121)
BUN/Creatinine Ratio: 15 (ref 10–24)
BUN: 24 mg/dL (ref 8–27)
Bilirubin Total: 0.8 mg/dL (ref 0.0–1.2)
CO2: 23 mmol/L (ref 20–29)
Calcium: 9.7 mg/dL (ref 8.6–10.2)
Chloride: 102 mmol/L (ref 96–106)
Creatinine, Ser: 1.56 mg/dL — ABNORMAL HIGH (ref 0.76–1.27)
GFR calc Af Amer: 47 mL/min/{1.73_m2} — ABNORMAL LOW (ref >59)
GFR calc non Af Amer: 41 mL/min/{1.73_m2} — ABNORMAL LOW (ref >59)
Globulin, Total: 2.4 g/dL (ref 1.5–4.5)
Glucose: 90 mg/dL (ref 65–99)
Potassium: 4.4 mmol/L (ref 3.5–5.2)
Sodium: 140 mmol/L (ref 134–144)
Total Protein: 7 g/dL (ref 6.0–8.5)

## 2020-01-28 LAB — BRAIN NATRIURETIC PEPTIDE: BNP: 37.5 pg/mL (ref 0.0–100.0)

## 2020-01-28 LAB — TSH: TSH: 6.82 u[IU]/mL — ABNORMAL HIGH (ref 0.450–4.500)

## 2020-01-29 NOTE — Addendum Note (Signed)
Addended by: Cathlean Marseilles A on: 01/29/2020 08:24 AM   Modules accepted: Orders

## 2020-01-31 LAB — THYROID PANEL
Free Thyroxine Index: 1.9 (ref 1.2–4.9)
T3 Uptake Ratio: 27 % (ref 24–39)
T4, Total: 7 ug/dL (ref 4.5–12.0)

## 2020-01-31 LAB — SPECIMEN STATUS REPORT

## 2020-02-13 ENCOUNTER — Ambulatory Visit: Payer: Medicaid Other | Admitting: Internal Medicine

## 2020-02-19 DIAGNOSIS — H903 Sensorineural hearing loss, bilateral: Secondary | ICD-10-CM | POA: Diagnosis not present

## 2020-02-19 DIAGNOSIS — H6123 Impacted cerumen, bilateral: Secondary | ICD-10-CM | POA: Diagnosis not present

## 2020-03-04 ENCOUNTER — Ambulatory Visit: Payer: Medicaid Other | Admitting: Nurse Practitioner

## 2020-03-11 ENCOUNTER — Encounter: Payer: Self-pay | Admitting: Internal Medicine

## 2020-03-11 ENCOUNTER — Ambulatory Visit (INDEPENDENT_AMBULATORY_CARE_PROVIDER_SITE_OTHER): Payer: Medicaid Other | Admitting: Internal Medicine

## 2020-03-11 ENCOUNTER — Other Ambulatory Visit: Payer: Self-pay

## 2020-03-11 VITALS — BP 130/80 | HR 87 | Ht 67.0 in | Wt 192.0 lb

## 2020-03-11 DIAGNOSIS — I25118 Atherosclerotic heart disease of native coronary artery with other forms of angina pectoris: Secondary | ICD-10-CM | POA: Diagnosis not present

## 2020-03-11 DIAGNOSIS — R600 Localized edema: Secondary | ICD-10-CM

## 2020-03-11 DIAGNOSIS — I10 Essential (primary) hypertension: Secondary | ICD-10-CM | POA: Diagnosis not present

## 2020-03-11 DIAGNOSIS — R42 Dizziness and giddiness: Secondary | ICD-10-CM

## 2020-03-11 MED ORDER — AMLODIPINE BESYLATE 5 MG PO TABS: 5.0000 mg | ORAL_TABLET | Freq: Every day | ORAL | 1 refills | Status: DC

## 2020-03-11 NOTE — Progress Notes (Signed)
Follow-up Outpatient Visit Date: 03/11/2020  Primary Care Provider: Steele Sizer, MD No address on file  Chief Complaint: Dizziness and leg swelling  HPI:  [name] is a 84 y.o. male with history of chronic angina and coronary artery disease based on abnormal myocardial perfusion stress tests at [facility], hypertension, hyperlipidemia, GERD, TIA, and CKD stage III, who presents for follow-up of coronary artery disease.  He was last seen in our office in 11/2019 for preoperative cardiovascular risk assessment in anticipation of hernia surgery.  He underwent myocardial perfusion stress test, which showed a fixed inferolateral defect that could reflect scar or artifact.  No ischemia was noted.  Study was felt to be low risk.  Due to reduced EF on stress test, echo was ordered and showed preserved LVEF (55-60%) with grade 2 diastolic dysfunction and mild mitral and aortic regurgitation.  Overall, [name] relatively well.  He has recovered from his hernia surgery.  Over the last few days, he has felt dizzy, especially when standing or turning quickly.  He describes it as if the room is spinning for a couple of seconds.  He has not fallen or passed out.  He denies palpitations and chest pain.  He has chronic exertional dyspnea that has actually improved over the last year.  He is concerned about frequent leg edema, especially as the day goes on.  At times, his legs are painful due to swelling.   --------------------------------------------------------------------------------------------------  Past Medical History:  Diagnosis Date  . Anginal pain (HCC)   . Arthritis Left knee  . Chronic kidney disease    STAGE 3  . Coronary artery disease   . GERD (gastroesophageal reflux disease)   . Gout   . History of kidney problems   . Hyperlipemia 08/18/2014  . Hyperlipidemia   . Hypertension   . Hypothyroidism   . TIA (transient ischemic attack)   . Total knee replacement  status 10/25/2016   Past Surgical History:  Procedure Laterality Date  . BUNIONECTOMY Bilateral   . EYE SURGERY Bilateral    Cataractt Extraction with IOL  . JOINT REPLACEMENT    . TOTAL KNEE ARTHROPLASTY Left 10/25/2016   Procedure: TOTAL KNEE ARTHROPLASTY;  Surgeon: Deeann Saint, MD;  Location: [facility] ORS;  Service: Orthopedics;  Laterality: Left;  . UMBILICAL HERNIA REPAIR N/A 01/01/2020   Procedure: HERNIA REPAIR UMBILICAL ADULT;  Surgeon: Henrene Dodge, MD;  Location: [facility] ORS;  Service: General;  Laterality: N/A;  . XI ROBOTIC ASSISTED INGUINAL HERNIA REPAIR WITH MESH Left 01/01/2020   Procedure: XI ROBOTIC ASSISTED INGUINAL HERNIA REPAIR WITH MESH;  Surgeon: Henrene Dodge, MD;  Location: [facility] ORS;  Service: General;  Laterality: Left;     Current Meds  Medication Sig  . acetaminophen (TYLENOL) 500 MG tablet Take 2 tablets (1,000 mg total) by mouth every 6 (six) hours as needed for mild pain or moderate pain.  Marland Kitchen allopurinol (ZYLOPRIM) 100 MG tablet Take 1 tablet (100 mg total) by mouth daily.  Marland Kitchen amLODipine (NORVASC) 10 MG tablet Take 1 tablet (10 mg total) by mouth daily.  Marland Kitchen atorvastatin (LIPITOR) 20 MG tablet Take 1 tablet (20 mg total) by mouth daily.  . carvedilol (COREG) 6.25 MG tablet Take 1 tablet (6.25 mg total) by mouth 2 (two) times daily with a meal.  . cetirizine (ZYRTEC) 5 MG tablet Take 1 tablet (5 mg total) by mouth daily.  . clopidogrel (PLAVIX) 75 MG tablet TAKE 1 TABLET BY MOUTH EVERY DAY (Patient taking  differently: Take 75 mg by mouth daily.)  . furosemide (LASIX) 40 MG tablet Take 1 tablet (40 mg total) by mouth daily as needed for edema (weight gain, shortness of breath).  . isosorbide mononitrate (IMDUR) 60 MG 24 hr tablet TAKE 1 AND 1/2 TABLET BY MOUTH EVERY DAY  . levothyroxine (SYNTHROID) 25 MCG tablet Take 1 tablet (25 mcg total) by mouth daily before breakfast.  . losartan (COZAAR) 50 MG tablet Take 50 mg by mouth daily.  . nitroGLYCERIN (NITROSTAT) 0.4 MG  SL tablet Place 1 tablet (0.4 mg total) under the tongue every 5 (five) minutes as needed for chest pain (chest pain). Maximum of 3 doses.  . pantoprazole (PROTONIX) 40 MG tablet Take 1 tablet (40 mg total) by mouth daily.    Allergies: Aspirin and Lisinopril  Social History   Tobacco Use  . Smoking status: Never Smoker  . Smokeless tobacco: Never Used  Vaping Use  . Vaping Use: Never used  Substance Use Topics  . Alcohol use: No  . Drug use: No    Family History  Problem Relation Age of Onset  . Cancer Mother        stomach  . Heart disease Neg Hx     Review of Systems: A 12-system review of systems was performed and was negative except as noted in the HPI.  --------------------------------------------------------------------------------------------------  Physical Exam: BP 130/80 (BP Location: Left Arm, Patient Position: Sitting, Cuff Size: Normal)   Pulse 87   Ht 5\' 7"  (1.702 m)   Wt 192 lb (87.1 kg)   SpO2 97%   BMI 30.07 kg/m   General: NAD.  Accompanied by Spanish interpreter and family member. Neck: No JVD or HJR. Lungs: Clear to auscultation bilateral without wheeze or crackles. Heart: Regular rate and rhythm with 2/6 systolic murmur.  No rubs or gallops. Abdomen: Soft, nontender, nondistended.  Extremities: 1+ pretibial edema to the proximal shins.  EKG: Normal sinus rhythm with isolated PVC, nonspecific T wave changes, and inferior infarct.  PVC new from prior tracing on 11/28/2019.  Otherwise, no significant interval change.  Lab Results  Component Value Date   WBC 4.1 11/03/2019   HGB 14.2 11/03/2019   HCT 42.0 11/03/2019   MCV 93 11/03/2019   PLT 266 11/03/2019    Lab Results  Component Value Date   NA 140 01/27/2020   K 4.4 01/27/2020   CL 102 01/27/2020   CO2 23 01/27/2020   BUN 24 01/27/2020   CREATININE 1.56 (H) 01/27/2020   GLUCOSE 90 01/27/2020   ALT 13 01/27/2020    Lab Results  Component Value Date   CHOL 114 04/11/2019   HDL  42 04/11/2019   LDLCALC 47 04/11/2019   LDLDIRECT UNABLETO PERFORMED DUE TO NORMAL TRIG 04/11/2019   TRIG 126 04/11/2019   CHOLHDL 2.7 04/11/2019    --------------------------------------------------------------------------------------------------  ASSESSMENT AND PLAN: Coronary artery disease with stable angina: No significant chest pain reported.  Exertional dyspnea has improved from prior visits.  Myocardial perfusion stress test earlier this year was reassuring with fixed inferolateral defect that may reflect scar versus artifact.  We have agreed to continue current medications including clopidogrel in lieu of aspirin given history of aspirin intolerance.  Atorvastatin 20 mg daily, as LDL has been well controlled.  Will need to continue monitoring PR interval in the setting of beta-blocker use with mild PR prolongation.  Dizziness: Symptoms most consistent with vertigo.  Blood pressure not low today.  However, given the leg swelling I  think would be reasonable to de-escalate amlodipine to see if this helps both his dizziness and edema.  If dizziness persists, further evaluation by his PCP would be helpful.  Hypertension: Blood pressure upper normal today.  Due to significant leg swelling, I have recommended decreasing amlodipine to 5 mg daily.  If his blood pressure trends up, escalation of one of his other agents will need to be considered in the future.  Leg edema: Chronic issue albeit more severe today.  We will decrease amlodipine to 5 mg daily.  [name] can continue to use furosemide as needed.  Leg elevation and use of compression stockings would also be helpful, as I suspect there is a component of venous insufficiency.  Follow-up: Return to clinic in 2 months.  Yvonne Kendall, MD 03/11/2020 8:49 AM

## 2020-03-11 NOTE — Patient Instructions (Signed)
Medication Instructions:  Your physician has recommended you make the following change in your medication:   DECREASE Amlodipine to 5mg  daily   *If you need a refill on your cardiac medications before your next appointment, please call your pharmacy*   Lab Work: None ordered   Testing/Procedures: None ordered   Follow-Up: At [facility], you and your health needs are our priority.  As part of our continuing mission to provide you with exceptional heart care, we have created designated Provider Care Teams.  These Care Teams include your primary Cardiologist (physician) and Advanced Practice Providers (APPs -  Physician Assistants and Nurse Practitioners) who all work together to provide you with the care you need, when you need it.  We recommend signing up for the patient portal called "MyChart".  Sign up information is provided on this After Visit Summary.  MyChart is used to connect with patients for Virtual Visits (Telemedicine).  Patients are able to view lab/test results, encounter notes, upcoming appointments, etc.  Non-urgent messages can be sent to your provider as well.   To learn more about what you can do with MyChart, go to [facility].    Your next appointment:   2 month(s)  The format for your next appointment:   In Person  Provider:   You may see [email or web address], MD or one of the following Advanced Practice Providers on your designated Care Team:    Yvonne Kendall, NP  Nicolasa Ducking, PA-C  Eula Listen, PA-C  Cadence [place], [place]  [place], NP

## 2020-03-13 DIAGNOSIS — Z227 Latent tuberculosis: Secondary | ICD-10-CM

## 2020-03-13 HISTORY — DX: Latent tuberculosis: Z22.7

## 2020-04-08 DIAGNOSIS — L409 Psoriasis, unspecified: Secondary | ICD-10-CM | POA: Diagnosis not present

## 2020-04-08 DIAGNOSIS — L301 Dyshidrosis [pompholyx]: Secondary | ICD-10-CM | POA: Diagnosis not present

## 2020-05-06 ENCOUNTER — Other Ambulatory Visit: Payer: Self-pay

## 2020-05-06 DIAGNOSIS — I2510 Atherosclerotic heart disease of native coronary artery without angina pectoris: Secondary | ICD-10-CM

## 2020-05-06 MED ORDER — PANTOPRAZOLE SODIUM 40 MG PO TBEC: 40.0000 mg | DELAYED_RELEASE_TABLET | Freq: Every day | ORAL | 1 refills | Status: DC

## 2020-05-23 ENCOUNTER — Other Ambulatory Visit: Payer: Self-pay | Admitting: Physician Assistant

## 2020-05-24 ENCOUNTER — Other Ambulatory Visit: Payer: Self-pay | Admitting: Physician Assistant

## 2020-05-24 ENCOUNTER — Other Ambulatory Visit: Payer: Self-pay

## 2020-05-24 DIAGNOSIS — I10 Essential (primary) hypertension: Secondary | ICD-10-CM

## 2020-05-24 NOTE — Telephone Encounter (Signed)
Refill request received for Cetirizine 5mg , QD QTY 90   LOV 01/27/20  No upcoming  Visits noted

## 2020-05-25 ENCOUNTER — Other Ambulatory Visit: Payer: Self-pay

## 2020-05-25 DIAGNOSIS — M109 Gout, unspecified: Secondary | ICD-10-CM

## 2020-05-25 DIAGNOSIS — I2510 Atherosclerotic heart disease of native coronary artery without angina pectoris: Secondary | ICD-10-CM

## 2020-05-25 DIAGNOSIS — E039 Hypothyroidism, unspecified: Secondary | ICD-10-CM

## 2020-05-25 MED ORDER — CETIRIZINE HCL 5 MG PO TABS: 5.0000 mg | ORAL_TABLET | Freq: Every day | ORAL | 1 refills | Status: DC

## 2020-05-25 MED ORDER — ATORVASTATIN CALCIUM 20 MG PO TABS: 20.0000 mg | ORAL_TABLET | Freq: Every day | ORAL | 0 refills | Status: DC

## 2020-05-25 MED ORDER — LOSARTAN POTASSIUM 50 MG PO TABS
50.0000 mg | ORAL_TABLET | Freq: Every day | ORAL | 0 refills | Status: DC
Start: 2020-05-25 — End: 2020-07-06

## 2020-05-25 MED ORDER — LEVOTHYROXINE SODIUM 25 MCG PO TABS: 25.0000 ug | ORAL_TABLET | Freq: Every day | ORAL | 0 refills | Status: DC

## 2020-05-25 MED ORDER — ALLOPURINOL 100 MG PO TABS: 100.0000 mg | ORAL_TABLET | Freq: Every day | ORAL | 0 refills | Status: DC

## 2020-05-25 NOTE — Telephone Encounter (Signed)
Patient last seen for routine f/up 12/04/19, was due for follow up 03/04/20. Please call to scheduled. Routing to provider for refill.

## 2020-05-25 NOTE — Telephone Encounter (Signed)
Lvm to make apt.  

## 2020-05-26 ENCOUNTER — Ambulatory Visit: Payer: Medicaid Other | Admitting: Internal Medicine

## 2020-05-26 ENCOUNTER — Encounter: Payer: Self-pay | Admitting: Internal Medicine

## 2020-05-26 ENCOUNTER — Other Ambulatory Visit: Payer: Self-pay

## 2020-05-26 VITALS — BP 142/82 | HR 72 | Ht 66.0 in | Wt 192.2 lb

## 2020-05-26 DIAGNOSIS — R0602 Shortness of breath: Secondary | ICD-10-CM

## 2020-05-26 DIAGNOSIS — I10 Essential (primary) hypertension: Secondary | ICD-10-CM | POA: Diagnosis not present

## 2020-05-26 DIAGNOSIS — M791 Myalgia, unspecified site: Secondary | ICD-10-CM | POA: Diagnosis not present

## 2020-05-26 DIAGNOSIS — N1832 Chronic kidney disease, stage 3b: Secondary | ICD-10-CM | POA: Diagnosis not present

## 2020-05-26 DIAGNOSIS — E785 Hyperlipidemia, unspecified: Secondary | ICD-10-CM

## 2020-05-26 DIAGNOSIS — I25119 Atherosclerotic heart disease of native coronary artery with unspecified angina pectoris: Secondary | ICD-10-CM

## 2020-05-26 DIAGNOSIS — R600 Localized edema: Secondary | ICD-10-CM | POA: Diagnosis not present

## 2020-05-26 DIAGNOSIS — I25118 Atherosclerotic heart disease of native coronary artery with other forms of angina pectoris: Secondary | ICD-10-CM | POA: Diagnosis not present

## 2020-05-26 DIAGNOSIS — I208 Other forms of angina pectoris: Secondary | ICD-10-CM

## 2020-05-26 DIAGNOSIS — R5383 Other fatigue: Secondary | ICD-10-CM

## 2020-05-26 MED ORDER — ISOSORBIDE MONONITRATE ER 60 MG PO TB24: 90.0000 mg | ORAL_TABLET | Freq: Every day | ORAL | 1 refills | Status: DC

## 2020-05-26 NOTE — Patient Instructions (Signed)
Medication Instructions:  Your physician has recommended you make the following change in your medication:  1- STOP Lipitor. 2- START Isosorbide 90 mg (1.5 tablets) by mouth once a day.  *If you need a refill on your cardiac medications before your next appointment, please call your pharmacy*   Lab Work: Your physician recommends that you return for lab work in: TODAY - LIPID, ESR, CBC, CMP, TOTAL CK, TSH.  If you have labs (blood work) drawn today and your tests are completely normal, you will receive your results only by: Marland Kitchen MyChart Message (if you have MyChart) OR . A paper copy in the mail If you have any lab test that is abnormal or we need to change your treatment, we will call you to review the results.   Testing/Procedures: none  Follow-Up: At [facility], you and your health needs are our priority.  As part of our continuing mission to provide you with exceptional heart care, we have created designated Provider Care Teams.  These Care Teams include your primary Cardiologist (physician) and Advanced Practice Providers (APPs -  Physician Assistants and Nurse Practitioners) who all work together to provide you with the care you need, when you need it.  We recommend signing up for the patient portal called "MyChart".  Sign up information is provided on this After Visit Summary.  MyChart is used to connect with patients for Virtual Visits (Telemedicine).  Patients are able to view lab/test results, encounter notes, upcoming appointments, etc.  Non-urgent messages can be sent to your provider as well.   To learn more about what you can do with MyChart, go to [email or web address].    Your next appointment:   7-10 day(s)  The format for your next appointment:   In Person  Provider:   You will see one of the following Advanced Practice Providers on your designated Care Team:    Murray Hodgkins, NP  Christell Faith, PA-C  Marrianne Mood, PA-C  Cadence [place], [place]  Laurann [place], NP

## 2020-05-26 NOTE — Progress Notes (Signed)
Follow-up Outpatient Visit Date: 05/26/2020  Primary Care Provider: Venita Lick, NP [place] [address]  Chief Complaint: Chest pain and body aches  HPI:  [name] is a 85 y.o. male with history of chronic angina and coronary artery disease based on abnormal myocardial perfusion stress tests at [facility], hypertension, hyperlipidemia, GERD, TIA, and CKD stage III, who presents for follow-up of coronary artery disease.  History is obtained with assistance of Spanish interpreter.  He is alone today.  I last saw him in 02/2020, at which time he was feeling relatively well.  He reported occasional dizziness when standing up or turning quickly most consistent with vertigo.  He had not had any recent chest pain.  He was concerned about frequent leg edema.  This prompted [place] to decrease amlodipine to 5 mg daily with continued use of as needed furosemide.  Today, the patient complains of increased body aches and fatigue over the last 6 months.  He also feels like his legs have been more swollen, though this has been a chronic complaint.  His exertional dyspnea and exertional chest pain have also worsened over the last 2 months.  On further review, he reports taking isosorbide mononitrate 30 mg daily rather than the 90 mg daily dose that has been prescribed.  He otherwise remains compliant with his medications.  He has not had any angina at rest nor palpitations or lightheadedness.  --------------------------------------------------------------------------------------------------  Past Medical History:  Diagnosis Date  . Anginal pain ([place])   . Arthritis Left knee  . Chronic kidney disease    STAGE 3  . Coronary artery disease   . GERD (gastroesophageal reflux disease)   . Gout   . History of kidney problems   . Hyperlipemia 08/18/2014  . Hyperlipidemia   . Hypertension   . Hypothyroidism   . TIA (transient ischemic attack)   . Total knee replacement status 10/25/2016    Past Surgical History:  Procedure Laterality Date  . BUNIONECTOMY Bilateral   . EYE SURGERY Bilateral    Cataractt Extraction with IOL  . JOINT REPLACEMENT    . TOTAL KNEE ARTHROPLASTY Left 10/25/2016   Procedure: TOTAL KNEE ARTHROPLASTY;  Surgeon: Gerald Leys, MD;  Location: [facility] ORS;  Service: Orthopedics;  Laterality: Left;  . UMBILICAL HERNIA REPAIR N/A 01/01/2020   Procedure: HERNIA REPAIR UMBILICAL ADULT;  Surgeon: Olean Ree, MD;  Location: [facility] ORS;  Service: General;  Laterality: N/A;  . XI ROBOTIC ASSISTED INGUINAL HERNIA REPAIR WITH MESH Left 01/01/2020   Procedure: XI ROBOTIC ASSISTED INGUINAL HERNIA REPAIR WITH MESH;  Surgeon: Olean Ree, MD;  Location: [facility] ORS;  Service: General;  Laterality: Left;    Current Meds  Medication Sig  . acetaminophen (TYLENOL) 500 MG tablet Take 2 tablets (1,000 mg total) by mouth every 6 (six) hours as needed for mild pain or moderate pain.  Marland Kitchen allopurinol (ZYLOPRIM) 100 MG tablet Take 1 tablet (100 mg total) by mouth daily.  Marland Kitchen amLODipine (NORVASC) 5 MG tablet Take 1 tablet (5 mg total) by mouth daily.  Marland Kitchen atorvastatin (LIPITOR) 20 MG tablet Take 1 tablet (20 mg total) by mouth daily.  . carvedilol (COREG) 6.25 MG tablet TAKE 1 TABLET(6.25 MG) BY MOUTH TWICE DAILY WITH A MEAL  . cetirizine (ZYRTEC) 5 MG tablet Take 1 tablet (5 mg total) by mouth daily.  . clopidogrel (PLAVIX) 75 MG tablet TAKE 1 TABLET BY MOUTH EVERY DAY  . furosemide (LASIX) 40 MG tablet Take 1 tablet (  40 mg total) by mouth daily as needed for edema (weight gain, shortness of breath).  Marland Kitchen levothyroxine (SYNTHROID) 25 MCG tablet Take 1 tablet (25 mcg total) by mouth daily before breakfast.  . losartan (COZAAR) 50 MG tablet Take 1 tablet (50 mg total) by mouth daily.  . nitroGLYCERIN (NITROSTAT) 0.4 MG SL tablet Place 1 tablet (0.4 mg total) under the tongue every 5 (five) minutes as needed for chest pain (chest pain). Maximum of 3 doses.  . pantoprazole (PROTONIX) 40  MG tablet Take 1 tablet (40 mg total) by mouth daily.    Allergies: Aspirin and Lisinopril  Social History   Tobacco Use  . Smoking status: Never Smoker  . Smokeless tobacco: Never Used  Vaping Use  . Vaping Use: Never used  Substance Use Topics  . Alcohol use: No  . Drug use: No    Family History  Problem Relation Age of Onset  . Cancer Mother        stomach  . Heart disease Neg Hx     Review of Systems: A 12-system review of systems was performed and was negative except as noted in the HPI.  --------------------------------------------------------------------------------------------------  Physical Exam: BP (!) 142/82 (BP Location: Left Arm, Patient Position: Sitting, Cuff Size: Normal)   Pulse 72   Ht 5' 6"  (1.676 m)   Wt 192 lb 4 oz (87.2 kg)   SpO2 96%   BMI 31.03 kg/m   General:  NAD. Neck: No JVD or HJR. Lungs: Clear to auscultation bilaterally without wheezes or crackles. Heart: Regular rate and rhythm without murmurs, rubs, or gallops. Abdomen: Soft, nontender, nondistended. Extremities: Trace pretibial edema bilaterally  EKG: Normal sinus rhythm with first-degree AV block (PR interval 212 ms) and nonspecific T wave changes.  Compared with prior tracing from 03/11/2020, criteria for inferior infarct and PVC are no longer present.  Lab Results  Component Value Date   WBC 4.1 11/03/2019   HGB 14.2 11/03/2019   HCT 42.0 11/03/2019   MCV 93 11/03/2019   PLT 266 11/03/2019    Lab Results  Component Value Date   NA 140 01/27/2020   K 4.4 01/27/2020   CL 102 01/27/2020   CO2 23 01/27/2020   BUN 24 01/27/2020   CREATININE 1.56 (H) 01/27/2020   GLUCOSE 90 01/27/2020   ALT 13 01/27/2020    Lab Results  Component Value Date   CHOL 114 04/11/2019   HDL 42 04/11/2019   LDLCALC 47 04/11/2019   LDLDIRECT UNABLETO PERFORMED DUE TO NORMAL TRIG 04/11/2019   TRIG 126 04/11/2019   CHOLHDL 2.7 04/11/2019     --------------------------------------------------------------------------------------------------  ASSESSMENT AND PLAN: Coronary artery disease with accelerating angina: Patient has a history of longstanding stable angina with low risk stress test in 12/2019.  He has previously declined coronary angiography due to concerns of worsening his chronic kidney disease.  He had been doing well at our last visit on isosorbide mononitrate 90 mg daily.  Amlodipine had been decreased to 5 mg daily at that time.  Over the last few months, it seems as though he has inadvertently been taking only 30 mg daily of isosorbide mononitrate which may explain his crescendo angina.  We discussed continued medical therapy with return to isosorbide mononitrate 90 mg daily versus proceeding with cardiac catheterization.  The patient prefers to try medical therapy first and reserve catheterization for refractory symptoms.  We will plan to continue current dose of carvedilol, which I am hesitant to escalate in the setting  of his first-degree AV block.  He will also remain on indefinite clopidogrel in lieu of aspirin given history of aspirin intolerance.  Hypertension: Blood pressure is mildly elevated today.  We will plan to continue current doses of amlodipine 5 mg daily, carvedilol 6.25 mg twice daily, and losartan 50 mg daily.  I will have Mr. Castrejon increase isosorbide mononitrate back to 90 mg daily.  Myalgias, fatigue, and hyperlipidemia: Most recent calculated LDL was excellent in 03/2019.  Given concern for fatigue and myalgias, we will have a statin holiday.  I will also check a CBC, CMP, lipid panel, TSH, CK, and ESR.  Lower extremity edema: Patient reports this is worse than at prior visits though he has only minimal edema on exam today.  It is not clear that de-escalation of amlodipine made any significant difference.  For now, we will continue his current regimen of furosemide 40 mg daily as needed,  which she has been taking most days.  Chronic kidney disease stage IIIb: Renal function has been relatively stable.  I will recheck a CMP today and avoid nephrotoxic agents as possible.  Follow-up: Return to clinic in 7-10 days, prior to planned trip to [place] in early April.  Nelva Bush, MD 05/26/2020 9:36 AM

## 2020-05-27 LAB — COMPREHENSIVE METABOLIC PANEL
ALT: 19 IU/L (ref 0–44)
AST: 25 IU/L (ref 0–40)
Albumin/Globulin Ratio: 2.2 (ref 1.2–2.2)
Albumin: 4.8 g/dL — ABNORMAL HIGH (ref 3.6–4.6)
Alkaline Phosphatase: 95 IU/L (ref 44–121)
BUN/Creatinine Ratio: 19 (ref 10–24)
BUN: 30 mg/dL — ABNORMAL HIGH (ref 8–27)
Bilirubin Total: 1 mg/dL (ref 0.0–1.2)
CO2: 22 mmol/L (ref 20–29)
Calcium: 9.9 mg/dL (ref 8.6–10.2)
Chloride: 99 mmol/L (ref 96–106)
Creatinine, Ser: 1.57 mg/dL — ABNORMAL HIGH (ref 0.76–1.27)
Globulin, Total: 2.2 g/dL (ref 1.5–4.5)
Glucose: 107 mg/dL — ABNORMAL HIGH (ref 65–99)
Potassium: 4.4 mmol/L (ref 3.5–5.2)
Sodium: 138 mmol/L (ref 134–144)
Total Protein: 7 g/dL (ref 6.0–8.5)
eGFR: 44 mL/min/{1.73_m2} — ABNORMAL LOW (ref >59)

## 2020-05-27 LAB — CBC
Hematocrit: 43.1 % (ref 37.5–51.0)
Hemoglobin: 15.2 g/dL (ref 13.0–17.7)
MCH: 31.5 pg (ref 26.6–33.0)
MCHC: 35.3 g/dL (ref 31.5–35.7)
MCV: 89 fL (ref 79–97)
Platelets: 276 10*3/uL (ref 150–450)
RBC: 4.82 x10E6/uL (ref 4.14–5.80)
RDW: 13.1 % (ref 11.6–15.4)
WBC: 4.2 10*3/uL (ref 3.4–10.8)

## 2020-05-27 LAB — SEDIMENTATION RATE: Sed Rate: 2 mm/hr (ref 0–30)

## 2020-05-27 LAB — TSH: TSH: 5.84 u[IU]/mL — ABNORMAL HIGH (ref 0.450–4.500)

## 2020-05-27 LAB — LIPID PANEL
Chol/HDL Ratio: 3.3 ratio (ref 0.0–5.0)
Cholesterol, Total: 105 mg/dL (ref 100–199)
HDL: 32 mg/dL — ABNORMAL LOW (ref >39)
LDL Chol Calc (NIH): 44 mg/dL (ref 0–99)
Triglycerides: 176 mg/dL — ABNORMAL HIGH (ref 0–149)
VLDL Cholesterol Cal: 29 mg/dL (ref 5–40)

## 2020-05-27 LAB — CK TOTAL AND CKMB (NOT AT ARMC)
CK-MB Index: 2.7 ng/mL (ref 0.0–10.4)
Total CK: 198 U/L (ref 30–208)

## 2020-05-28 ENCOUNTER — Encounter: Payer: Self-pay | Admitting: Internal Medicine

## 2020-05-28 DIAGNOSIS — R600 Localized edema: Secondary | ICD-10-CM | POA: Insufficient documentation

## 2020-06-03 ENCOUNTER — Ambulatory Visit: Payer: Medicaid Other | Admitting: Nurse Practitioner

## 2020-06-03 ENCOUNTER — Telehealth: Payer: Self-pay | Admitting: *Deleted

## 2020-06-03 NOTE — Telephone Encounter (Signed)
Called patient using [employer] ID number [id number]. No answer. Left message to call back.  Phone note started.  Next appointment is 06/09/20 at 9 am with Gillian Shields, NP.

## 2020-06-03 NOTE — Telephone Encounter (Signed)
-----   Message from Yvonne Kendall, MD sent at 05/28/2020  2:03 PM EDT ----- Please let the patient know that his kidney function remains moderately impaired but is stable compared with a year ago.  His LDL is well controlled, but as discussed at our visit this week, I recommend that he hold atorvastatin until our follow-up visit to see if his myalgias improve.  His TSH is elevated suggesting that he remains hypothyroid.  He should follow-up with his PCP for further management of this.  I will forward these results to her as well for further review.  Total creatinine kinase and sedimentation rate are normal suggesting that he does not have any ongoing muscle injury or systemic inflammation to explain his myalgias.

## 2020-06-07 NOTE — Telephone Encounter (Signed)
Called the patient patient using [employer] ID number [id number]. Attempted to reach the patient x2. Patients phone line is busy with a msg stating that the call cannot be completed at this time.  Unable to lmtcb. Patient has an appt on 06/09/20 with Gillian Shields, NP.

## 2020-06-08 NOTE — Progress Notes (Signed)
Cardiology Office Note:    Date:  06/09/2020   ID:  Gerald Hodges, DOB 1936-03-03, MRN [id number]  PCP:  Marjie Skiff, NP  [facility] Cardiologist:  Yvonne Kendall, MD  [facility] Electrophysiologist:  None   Referring MD: Marjie Skiff, NP   Chief Complaint: 1-2 week follow-up  History of Present Illness:    Gerald Hodges is a 85 y.o. male with a hx of chronic angina, CAD on abnormal myocardial perfusion stress tests at [facility], HTN, HLD, GERD, TIA, CKD stage 3 who presents for follow-up. He has previsouly declined ischemic work-up due to concerns of worsening CKD.   In December 2021 amlodipine was decreased for leg edema.   Seen 05/26/20 for chest pain and body aches. He was only taking 30mg  Imdur when he was prescribed 90mg . He again wanted to try medical therapy over cardiac cath. On plavix for h/o aspirin intolerance. Did not increase coreg due to 1st degree AV block. Also reported myalgias and statin was held. Labs were drawn.  Today, he is accompanied by his son and . Patient reports lower leg swelling that is worse in the night after a day of walking. He sometimes elevates his feet, however he feels this does not help the swelling. He does have compression stockings, but does not use them. He does not eat a lot of salt. In the morning he has no swelling. Sounds to be dependent edema. He has palpitations that happen during throughout the day, they seem to be related to brief chest pain episodes. These are the same pains he was having before and Imdur was increased. Episodes have been less frequent and pain has improved. Pain has been dull and so he has not needed his NTG. Blood pressure is a little high, but he did not have his medications today. He does not take his BP every day. He is leaving for this Saturday. He feels his geneneral fatigue and muscle pain is better after stopping statin. Seems he still has body pain,  sounds like arthritis.    Past Medical History:  Diagnosis Date  . Anginal pain (HCC)   . Arthritis Left knee  . Chronic kidney disease    STAGE 3  . Coronary artery disease   . GERD (gastroesophageal reflux disease)   . Gout   . History of kidney problems   . Hyperlipemia 08/18/2014  . Hyperlipidemia   . Hypertension   . Hypothyroidism   . TIA (transient ischemic attack)   . Total knee replacement status 10/25/2016    Past Surgical History:  Procedure Laterality Date  . BUNIONECTOMY Bilateral   . EYE SURGERY Bilateral    Cataractt Extraction with IOL  . JOINT REPLACEMENT    . TOTAL KNEE ARTHROPLASTY Left 10/25/2016   Procedure: TOTAL KNEE ARTHROPLASTY;  Surgeon: 10/27/2016, MD;  Location: [facility] ORS;  Service: Orthopedics;  Laterality: Left;  . UMBILICAL HERNIA REPAIR N/A 01/01/2020   Procedure: HERNIA REPAIR UMBILICAL ADULT;  Surgeon: Deeann Saint, MD;  Location: [facility] ORS;  Service: General;  Laterality: N/A;  . XI ROBOTIC ASSISTED INGUINAL HERNIA REPAIR WITH MESH Left 01/01/2020   Procedure: XI ROBOTIC ASSISTED INGUINAL HERNIA REPAIR WITH MESH;  Surgeon: Henrene Dodge, MD;  Location: [facility] ORS;  Service: General;  Laterality: Left;    Current Medications: Current Meds  Medication Sig  . acetaminophen (TYLENOL) 500 MG tablet Take 2 tablets (1,000 mg total) by mouth every 6 (six) hours as needed for mild  pain or moderate pain.  Marland Kitchen allopurinol (ZYLOPRIM) 100 MG tablet Take 1 tablet (100 mg total) by mouth daily.  Marland Kitchen amLODipine (NORVASC) 5 MG tablet Take 1 tablet (5 mg total) by mouth daily.  . carvedilol (COREG) 6.25 MG tablet TAKE 1 TABLET(6.25 MG) BY MOUTH TWICE DAILY WITH A MEAL  . cetirizine (ZYRTEC) 5 MG tablet Take 1 tablet (5 mg total) by mouth daily.  . clopidogrel (PLAVIX) 75 MG tablet TAKE 1 TABLET BY MOUTH EVERY DAY  . furosemide (LASIX) 40 MG tablet Take 1 tablet (40 mg total) by mouth daily as needed for edema (weight gain, shortness of breath).  . isosorbide  mononitrate (IMDUR) 60 MG 24 hr tablet Take 1.5 tablets (90 mg total) by mouth daily.  Marland Kitchen levothyroxine (SYNTHROID) 25 MCG tablet Take 1 tablet (25 mcg total) by mouth daily before breakfast.  . losartan (COZAAR) 50 MG tablet Take 1 tablet (50 mg total) by mouth daily.  . nitroGLYCERIN (NITROSTAT) 0.4 MG SL tablet Place 1 tablet (0.4 mg total) under the tongue every 5 (five) minutes as needed for chest pain (chest pain). Maximum of 3 doses.  . pantoprazole (PROTONIX) 40 MG tablet Take 1 tablet (40 mg total) by mouth daily.     Allergies:   Aspirin and Lisinopril   Social History   Socioeconomic History  . Marital status: Married    Spouse name: Not on file  . Number of children: Not on file  . Years of education: Not on file  . Highest education level: Not on file  Occupational History  . Not on file  Tobacco Use  . Smoking status: Never Smoker  . Smokeless tobacco: Never Used  Vaping Use  . Vaping Use: Never used  Substance and Sexual Activity  . Alcohol use: No  . Drug use: No  . Sexual activity: Not Currently  Other Topics Concern  . Not on file  Social History Narrative  . Not on file   Social Determinants of Health   Financial Resource Strain: Not on file  Food Insecurity: Not on file  Transportation Needs: Not on file  Physical Activity: Not on file  Stress: Not on file  Social Connections: Not on file     Family History: The patient's family history includes Cancer in his mother. There is no history of Heart disease.  ROS:   Please see the history of present illness.     All other systems reviewed and are negative.  EKGs/Labs/Other Studies Reviewed:    The following studies were reviewed today:  Myoview Lexiscan 12/2019 Narrative & Impression  Pharmacological myocardial perfusion imaging study with no significant  Ischemia Small to moderate-sized region of predominantly fixed defect in the inferolateral wall , challenging to interpret in the setting of  GI uptake artifact Normal wall motion, EF estimated at 42% (possibly depressed secondary to artifact) No EKG changes concerning for ischemia at peak stress or in recovery. CT attenuation correction images with very mild aortic atherosclerosis, no notable coronary calcification Low risk scan   Signed, Dossie Arbour, MD, Ph.D [facility]     Echo 12/2019 1. Left ventricular ejection fraction, by estimation, is 55 to 60%. The  left ventricle has normal function. The left ventricle has no regional  wall motion abnormalities. There is mild left ventricular hypertrophy.  Left ventricular diastolic parameters  are consistent with Grade II diastolic dysfunction (pseudonormalization).  2. Right ventricular systolic function is normal. The right ventricular  size is normal.  3. The  mitral valve is normal in structure. Mild mitral valve  regurgitation.  4. The aortic valve is tricuspid. Aortic valve regurgitation is mild.  5. The inferior vena cava is dilated in size with >50% respiratory  variability, suggesting right atrial pressure of 8 mmHg.   EKG:  EKG is  ordered today.  The ekg ordered today demonstrates NSR, 68bpm, first degree AV block, nonspecific T wave abnormality, PRI 212 ms. Recent Labs: 01/27/2020: BNP 37.5 05/26/2020: ALT 19; BUN 30; Creatinine, Ser 1.57; Hemoglobin 15.2; Platelets 276; Potassium 4.4; Sodium 138; TSH 5.840  Recent Lipid Panel    Component Value Date/Time   CHOL 105 05/26/2020 1001   TRIG 176 (H) 05/26/2020 1001   HDL 32 (L) 05/26/2020 1001   CHOLHDL 3.3 05/26/2020 1001   CHOLHDL 2.7 04/11/2019 0934   VLDL 25 04/11/2019 0934   LDLCALC 44 05/26/2020 1001   LDLDIRECT UNABLETO PERFORMED DUE TO NORMAL TRIG 04/11/2019 0934      Physical Exam:    VS:  BP 140/76 (BP Location: Left Arm, Patient Position: Sitting, Cuff Size: Normal) Comment: Without meds  Pulse 68   Ht 5\' 4"  (1.626 m)   Wt 191 lb (86.6 kg)   SpO2 98%   BMI 32.79 kg/m     Wt  Readings from Last 3 Encounters:  06/09/20 191 lb (86.6 kg)  05/26/20 192 lb 4 oz (87.2 kg)  03/11/20 192 lb (87.1 kg)     GEN:  Well nourished, well developed in no acute distress HEENT: Normal NECK: No JVD; No carotid bruits LYMPHATICS: No lymphadenopathy CARDIAC: RRR, no murmurs, rubs, gallops RESPIRATORY:  Clear to auscultation without rales, wheezing or rhonchi  ABDOMEN: Soft, non-tender, non-distended MUSCULOSKELETAL:  No edema; No deformity  SKIN: Warm and dry NEUROLOGIC:  Alert and oriented x 3 PSYCHIATRIC:  Normal affect   ASSESSMENT:    1. Coronary artery disease involving native coronary artery of native heart with angina pectoris (HCC)   2. Essential hypertension   3. Hyperlipidemia LDL goal <70   4. Leg edema   5. Chronic diastolic heart failure (HCC)    PLAN:    In order of problems listed above:  Chest pain CAD Patient reports improved chest pain with increased dose of Imdur, however still has episodes of chest discomfort. He has not taken NTG. Again cardiac cath vs medical management was discussed. Patient would like to continue with medical management. He is leaving for 03/13/20 in 3 days. I will not increase Imdur at this time, but we will see him back and can likely increase it at that time.  Instructed to take all his medications on his trip. Labs were reviewed. Continue plavix. No aspirin with allergy. Continue BB. Statin held for myalgias/fatigue. Can also consider Ranexa for refractory anginal symptoms in the future.  HTN BP elevated today but did not have medications this morning. I asked him to obtain a BP cuff and take daily bps.. Will not increase Imdur today as above since he is traveling, but can possibly increase it at follow-up if blood pressure will tolerate.  HLD Fatigue and myalgias reported with atorvastatin, so this was held for 'statin holiday'. He feels symptoms are better. CK was normal. LDL was 44. Will continue to hold statin. I think  patient had generalized aches and pains due to arthritis and deconditioning. At follow-up can consider trying Crestor.   Lower extremity edema HFpEF LLE improved since amlodipine was decreased, however now reports dependent edema. Admits to low salt  diet. Echo last year showed G2DD and normal LV function. He takes lasix 40mg  for volume maintenance. He has trace edema on exam. Lungs clear and no JVD. Weights are unchanged. He was instructed to use his compression stockings and elevate his legs. Can consider d/c amlodipine at follow-up, but I think minor lifestyle changes might help.   CKD 3 Recent labs showe stable creatinine  Disposition: Follow up in 1 month(s) with APP/MD    Signed, Jaise Moser David Stall, PA-C  06/09/2020 11:59 AM    [facility]

## 2020-06-09 ENCOUNTER — Encounter: Payer: Self-pay | Admitting: Medical

## 2020-06-09 ENCOUNTER — Ambulatory Visit (INDEPENDENT_AMBULATORY_CARE_PROVIDER_SITE_OTHER): Payer: Medicaid Other | Admitting: Medical

## 2020-06-09 ENCOUNTER — Encounter: Payer: Self-pay | Admitting: *Deleted

## 2020-06-09 ENCOUNTER — Other Ambulatory Visit: Payer: Self-pay

## 2020-06-09 VITALS — BP 140/76 | HR 68 | Ht 64.0 in | Wt 191.0 lb

## 2020-06-09 DIAGNOSIS — I10 Essential (primary) hypertension: Secondary | ICD-10-CM

## 2020-06-09 DIAGNOSIS — I25119 Atherosclerotic heart disease of native coronary artery with unspecified angina pectoris: Secondary | ICD-10-CM | POA: Diagnosis not present

## 2020-06-09 DIAGNOSIS — R600 Localized edema: Secondary | ICD-10-CM | POA: Diagnosis not present

## 2020-06-09 DIAGNOSIS — I5032 Chronic diastolic (congestive) heart failure: Secondary | ICD-10-CM

## 2020-06-09 DIAGNOSIS — E785 Hyperlipidemia, unspecified: Secondary | ICD-10-CM | POA: Diagnosis not present

## 2020-06-09 NOTE — Telephone Encounter (Signed)
Patient had appointment today with provider.

## 2020-06-09 NOTE — Patient Instructions (Addendum)
Medication Instructions:  Your physician recommends that you continue on your current medications as directed. Please refer to the Current Medication list given to you today.  *If you need a refill on your cardiac medications before your next appointment, please call your pharmacy*   Lab Work: None ordered   Testing/Procedures: None ordered   Follow-Up: At [facility], you and your health needs are our priority.  As part of our continuing mission to provide you with exceptional heart care, we have created designated Provider Care Teams.  These Care Teams include your primary Cardiologist (physician) and Advanced Practice Providers (APPs -  Physician Assistants and Nurse Practitioners) who all work together to provide you with the care you need, when you need it.  We recommend signing up for the patient portal called "MyChart".  Sign up information is provided on this After Visit Summary.  MyChart is used to connect with patients for Virtual Visits (Telemedicine).  Patients are able to view lab/test results, encounter notes, upcoming appointments, etc.  Non-urgent messages can be sent to your provider as well.   To learn more about what you can do with MyChart, go to [email or web address].    Your next appointment:   1 month(s)  The format for your next appointment:   In Person  Provider:   You may see Yvonne Kendall, MD or one of the following Advanced Practice Providers on your designated Care Team:    Nicolasa Ducking, NP  Eula Listen, PA-C  Marisue Ivan, PA-C  Cadence Fransico Michael, [place]  Gillian Shields, NP    Other Instructions  Please monitor blood pressure daily at home (see below)  Please keep legs elevated while sitting  Please wear compression socks to help with swelling  Tips to Measure your Blood Pressure Correctly  To determine whether you have hypertension, a medical professional will take a blood pressure reading. How you prepare for the test, the  position of your arm, and other factors can change a blood pressure reading by 10% or more. That could be enough to hide high blood pressure, start you on a drug you don't really need, or lead your doctor to incorrectly adjust your medications.  National and international guidelines offer specific instructions for measuring blood pressure. If a doctor, nurse, or medical assistant isn't doing it right, don't hesitate to ask him or her to get with the guidelines.  Here's what you can do to ensure a correct reading: . Don't drink a caffeinated beverage or smoke during the 30 minutes before the test. . Sit quietly for five minutes before the test begins. . During the measurement, sit in a chair with your feet on the floor and your arm supported so your elbow is at about heart level. . The inflatable part of the cuff should completely cover at least 80% of your upper arm, and the cuff should be placed on bare skin, not over a shirt. . Don't talk during the measurement. . Have your blood pressure measured twice, with a brief break in between. If the readings are different by 5 points or more, have it done a third time.  There are times to break these rules. If you sometimes feel lightheaded when getting out of bed in the morning or when you stand after sitting, you should have your blood pressure checked while seated and then while standing to see if it falls from one position to the next.  Because blood pressure varies throughout the day, your doctor will rarely diagnose hypertension  on the basis of a single reading. Instead, he or she will want to confirm the measurements on at least two occasions, usually within a few weeks of one another. The exception to this rule is if you have a blood pressure reading of 180/110 mm Hg or higher. A result this high usually calls for prompt treatment.  It's a good idea to have your blood pressure measured in both arms at least once, since the reading in one arm  (usually the right) may be higher than that in the left. A 2014 study in The American Journal of Medicine of nearly 3,400 people found average arm- to-arm differences in systolic blood pressure of about 5 points. The higher number should be used to make treatment decisions.  In 2017, new guidelines from the American Heart Association, the [employer] of Cardiology, and nine other health organizations lowered the diagnosis of high blood pressure to 130/80 mm Hg or higher for all adults. The guidelines also redefined the various blood pressure categories to now include normal, elevated, Stage 1 hypertension, Stage 2 hypertension, and hypertensive crisis (see "Blood pressure categories").  Blood pressure categories  Blood pressure category SYSTOLIC (upper number)  DIASTOLIC (lower number)  Normal Less than 120 mm Hg and Less than 80 mm Hg  Elevated 120-129 mm Hg and Less than 80 mm Hg  High blood pressure: Stage 1 hypertension 130-139 mm Hg or 80-89 mm Hg  High blood pressure: Stage 2 hypertension 140 mm Hg or higher or 90 mm Hg or higher  Hypertensive crisis (consult your doctor immediately) Higher than 180 mm Hg and/or Higher than 120 mm Hg  Source: American Heart Association and American Stroke Association. For more on getting your blood pressure under control, buy Controlling Your Blood Pressure, a Special Health Report from [facility].   Blood Pressure Log   Date   Time  Blood Pressure  Position  Example: Nov 1 9 AM 124/78 sitting                                                    Heart Healthy Diet Recommendations: A low-salt diet is recommended. Meats should be grilled, baked, or boiled. Avoid fried foods. Focus on lean protein sources like fish or chicken with vegetables and fruits. The American Heart Association is a Chief Technology Officer!  American Heart Association Diet and Lifeystyle Recommendations   Exercise recommendations: The American Heart  Association recommends 150 minutes of moderate intensity exercise weekly. Try 30 minutes of moderate intensity exercise 4-5 times per week. This could include walking, jogging, or swimming.

## 2020-07-02 ENCOUNTER — Other Ambulatory Visit: Payer: Self-pay | Admitting: Nurse Practitioner

## 2020-07-02 DIAGNOSIS — E039 Hypothyroidism, unspecified: Secondary | ICD-10-CM

## 2020-07-02 DIAGNOSIS — I2510 Atherosclerotic heart disease of native coronary artery without angina pectoris: Secondary | ICD-10-CM

## 2020-07-05 ENCOUNTER — Other Ambulatory Visit: Payer: Self-pay | Admitting: Nurse Practitioner

## 2020-07-05 DIAGNOSIS — M109 Gout, unspecified: Secondary | ICD-10-CM

## 2020-07-05 NOTE — Telephone Encounter (Signed)
Lvm to make this apt. 

## 2020-07-05 NOTE — Telephone Encounter (Signed)
Notes to clinic:  courtesy refill has already been given Last appt was canceled and no new appt scheduled    Requested Prescriptions  Pending Prescriptions Disp Refills   losartan (COZAAR) 50 MG tablet [Pharmacy Med Name: LOSARTAN 50MG  TABLETS] 30 tablet 0    Sig: TAKE 1 TABLET BY MOUTH DAILY      Cardiovascular:  Angiotensin Receptor Blockers Failed - 07/05/2020  9:22 AM      Failed - Cr in normal range and within 180 days    Creatinine, Ser  Date Value Ref Range Status  05/26/2020 1.57 (H) 0.76 - 1.27 mg/dL Final          Failed - Last BP in normal range    BP Readings from Last 1 Encounters:  06/09/20 140/76          Passed - K in normal range and within 180 days    Potassium  Date Value Ref Range Status  05/26/2020 4.4 3.5 - 5.2 mmol/L Final          Passed - Patient is not pregnant      Passed - Valid encounter within last 6 months    Recent Outpatient Visits           5 months ago Leg swelling   [facility], NP   7 months ago Stage 3b chronic kidney disease   [facility] Valentino Nose, NP   8 months ago Hypothyroidism, unspecified type   [facility], NP   1 year ago Gout, unspecified cause, unspecified chronicity, unspecified site   [facility], NP   1 year ago Gastroesophageal reflux disease, esophagitis presence not specified   [facility] [place], [place], [place]ley Hews       Future Appointments             In 1 week End, [place], MD [facility] [place], [facility]               allopurinol (ZYLOPRIM) 100 MG tablet [Pharmacy Med Name: ALLOPURINOL 100MG  TABLETS] 30 tablet 0    Sig: TAKE 1 TABLET(100 MG) BY MOUTH DAILY      Endocrinology:  Gout Agents Failed - 07/05/2020  9:22 AM      Failed - Uric Acid in normal range and within 360 days    Uric Acid  Date Value Ref Range Status  10/04/2018 6.7 3.7 -  8.6 mg/dL Final    Comment:               Therapeutic target for gout patients: <6.0          Failed - Cr in normal range and within 360 days    Creatinine, Ser  Date Value Ref Range Status  05/26/2020 1.57 (H) 0.76 - 1.27 mg/dL Final          Passed - Valid encounter within last 12 months    Recent Outpatient Visits           5 months ago Leg swelling   [facility] 05/28/2020, NP   7 months ago Stage 3b chronic kidney disease   [facility], NP   8 months ago Hypothyroidism, unspecified type   [facility] Valentino Nose, NP   1 year ago Gout, unspecified cause, unspecified chronicity, unspecified site   [facility] Valentino Nose, NP   1 year ago  Gastroesophageal reflux disease, esophagitis presence not specified   [facility] Particia Nearing, [place]       Future Appointments             In 1 week End, Cristal Deer, MD [facility], [facility]

## 2020-07-06 NOTE — Telephone Encounter (Signed)
Pt scheduled for 07/27/2020 asking for medication to be sent to be bridged over.

## 2020-07-16 ENCOUNTER — Ambulatory Visit: Payer: Medicaid Other | Admitting: Internal Medicine

## 2020-07-16 NOTE — Progress Notes (Deleted)
Follow-up Outpatient Visit Date: 07/16/2020  Primary Care Provider: Marjie Skiff, NP [address] [place] [address]  Chief Complaint: ***  HPI:  [name] is a 85 y.o. male with history of chronic angina and coronary artery disease based on abnormal myocardial perfusion stress tests at [facility] (most recently 12/2019), hypertension, hyperlipidemia, GERD, TIA, and CKD stage III, who presents for follow-up of coronary artery disease.  I last saw him in March, at which time he complained of increasing chest pain and exertional dyspnea in the setting of having been taking isosorbide mononitrate 30 mg daily instead of the prescribed 90 mg daily.  We elected to increase isosorbide mononitrate back to 90 mg daily and also perform a statin holiday to see if his myalgias would improve.  He was seen in follow-up 2 weeks later by cadence Fransico Michael, PA, at which time he reported improved myalgias as well as less frequent chest pain.  He did not wish to pursue further testing at that time.  --------------------------------------------------------------------------------------------------  Past Medical History:  Diagnosis Date  . Anginal pain (HCC)   . Arthritis Left knee  . Chronic kidney disease    STAGE 3  . Coronary artery disease   . GERD (gastroesophageal reflux disease)   . Gout   . History of kidney problems   . Hyperlipemia 08/18/2014  . Hyperlipidemia   . Hypertension   . Hypothyroidism   . TIA (transient ischemic attack)   . Total knee replacement status 10/25/2016   Past Surgical History:  Procedure Laterality Date  . BUNIONECTOMY Bilateral   . EYE SURGERY Bilateral    Cataractt Extraction with IOL  . JOINT REPLACEMENT    . TOTAL KNEE ARTHROPLASTY Left 10/25/2016   Procedure: TOTAL KNEE ARTHROPLASTY;  Surgeon: Deeann Saint, MD;  Location: [facility] ORS;  Service: Orthopedics;  Laterality: Left;  . UMBILICAL HERNIA REPAIR N/A 01/01/2020   Procedure: HERNIA REPAIR  UMBILICAL ADULT;  Surgeon: Henrene Dodge, MD;  Location: [facility] ORS;  Service: General;  Laterality: N/A;  . XI ROBOTIC ASSISTED INGUINAL HERNIA REPAIR WITH MESH Left 01/01/2020   Procedure: XI ROBOTIC ASSISTED INGUINAL HERNIA REPAIR WITH MESH;  Surgeon: Henrene Dodge, MD;  Location: [facility] ORS;  Service: General;  Laterality: Left;    No outpatient medications have been marked as taking for the 07/16/20 encounter (Appointment) with Jaiah Weigel, Cristal Deer, MD.    Allergies: Aspirin and Lisinopril  Social History   Tobacco Use  . Smoking status: Never Smoker  . Smokeless tobacco: Never Used  Vaping Use  . Vaping Use: Never used  Substance Use Topics  . Alcohol use: No  . Drug use: No    Family History  Problem Relation Age of Onset  . Cancer Mother        stomach  . Heart disease Neg Hx     Review of Systems: A 12-system review of systems was performed and was negative except as noted in the HPI.  --------------------------------------------------------------------------------------------------  Physical Exam: There were no vitals taken for this visit.  General:  NAD. Neck: No JVD or HJR. Lungs: Clear to auscultation bilaterally without wheezes or crackles. Heart: Regular rate and rhythm without murmurs, rubs, or gallops. Abdomen: Soft, nontender, nondistended. Extremities: No lower extremity edema.  EKG:  ***  Lab Results  Component Value Date   WBC 4.2 05/26/2020   HGB 15.2 05/26/2020   HCT 43.1 05/26/2020   MCV 89 05/26/2020   PLT 276 05/26/2020    Lab  Results  Component Value Date   NA 138 05/26/2020   K 4.4 05/26/2020   CL 99 05/26/2020   CO2 22 05/26/2020   BUN 30 (H) 05/26/2020   CREATININE 1.57 (H) 05/26/2020   GLUCOSE 107 (H) 05/26/2020   ALT 19 05/26/2020    Lab Results  Component Value Date   CHOL 105 05/26/2020   HDL 32 (L) 05/26/2020   LDLCALC 44 05/26/2020   LDLDIRECT UNABLETO PERFORMED DUE TO NORMAL TRIG 04/11/2019   TRIG 176 (H) 05/26/2020    CHOLHDL 3.3 05/26/2020    --------------------------------------------------------------------------------------------------  ASSESSMENT AND PLAN: Yvonne Kendall, MD 07/16/2020 6:48 AM

## 2020-07-24 ENCOUNTER — Encounter: Payer: Self-pay | Admitting: Nurse Practitioner

## 2020-07-27 ENCOUNTER — Other Ambulatory Visit: Payer: Self-pay

## 2020-07-27 ENCOUNTER — Encounter: Payer: Self-pay | Admitting: Nurse Practitioner

## 2020-07-27 ENCOUNTER — Ambulatory Visit: Payer: Medicaid Other | Admitting: Nurse Practitioner

## 2020-07-27 VITALS — BP 132/84 | HR 78 | Temp 98.6°F | Wt 189.2 lb

## 2020-07-27 DIAGNOSIS — J301 Allergic rhinitis due to pollen: Secondary | ICD-10-CM

## 2020-07-27 DIAGNOSIS — K219 Gastro-esophageal reflux disease without esophagitis: Secondary | ICD-10-CM

## 2020-07-27 DIAGNOSIS — M1A3720 Chronic gout due to renal impairment, left ankle and foot, without tophus (tophi): Secondary | ICD-10-CM

## 2020-07-27 DIAGNOSIS — I5032 Chronic diastolic (congestive) heart failure: Secondary | ICD-10-CM | POA: Diagnosis not present

## 2020-07-27 DIAGNOSIS — I2510 Atherosclerotic heart disease of native coronary artery without angina pectoris: Secondary | ICD-10-CM | POA: Diagnosis not present

## 2020-07-27 DIAGNOSIS — I208 Other forms of angina pectoris: Secondary | ICD-10-CM | POA: Diagnosis not present

## 2020-07-27 DIAGNOSIS — E785 Hyperlipidemia, unspecified: Secondary | ICD-10-CM | POA: Diagnosis not present

## 2020-07-27 DIAGNOSIS — I10 Essential (primary) hypertension: Secondary | ICD-10-CM | POA: Diagnosis not present

## 2020-07-27 DIAGNOSIS — E039 Hypothyroidism, unspecified: Secondary | ICD-10-CM | POA: Diagnosis not present

## 2020-07-27 DIAGNOSIS — N1832 Chronic kidney disease, stage 3b: Secondary | ICD-10-CM

## 2020-07-27 MED ORDER — LOSARTAN POTASSIUM 50 MG PO TABS
1.0000 | ORAL_TABLET | Freq: Every day | ORAL | 4 refills | Status: DC
Start: 2020-07-27 — End: 2021-01-27

## 2020-07-27 MED ORDER — CETIRIZINE HCL 5 MG PO TABS: 5.0000 mg | ORAL_TABLET | Freq: Every day | ORAL | 4 refills | Status: DC

## 2020-07-27 MED ORDER — CLOPIDOGREL BISULFATE 75 MG PO TABS: 1.0000 | ORAL_TABLET | Freq: Every day | ORAL | 4 refills | Status: DC

## 2020-07-27 MED ORDER — FUROSEMIDE 40 MG PO TABS: 40.0000 mg | ORAL_TABLET | Freq: Every day | ORAL | 4 refills | Status: DC | PRN

## 2020-07-27 MED ORDER — ALLOPURINOL 100 MG PO TABS: ORAL_TABLET | ORAL | 4 refills | Status: AC

## 2020-07-27 MED ORDER — FLUTICASONE PROPIONATE 50 MCG/ACT NA SUSP: 2.0000 | Freq: Every day | NASAL | 6 refills | Status: DC

## 2020-07-27 MED ORDER — PANTOPRAZOLE SODIUM 40 MG PO TBEC: 40.0000 mg | DELAYED_RELEASE_TABLET | Freq: Every day | ORAL | 4 refills | Status: AC

## 2020-07-27 MED ORDER — AMLODIPINE BESYLATE 5 MG PO TABS: 5.0000 mg | ORAL_TABLET | Freq: Every day | ORAL | 4 refills | Status: DC

## 2020-07-27 MED ORDER — LEVOTHYROXINE SODIUM 25 MCG PO TABS: 25.0000 ug | ORAL_TABLET | Freq: Every day | ORAL | 0 refills | Status: DC

## 2020-07-27 NOTE — Assessment & Plan Note (Signed)
Chronic, ongoing, followed by cardiology.  Continue this collaboration and current medication regiment as prescribed by them.  Recent note reviewed.

## 2020-07-27 NOTE — Patient Instructions (Signed)

## 2020-07-27 NOTE — Assessment & Plan Note (Signed)
Chronic, ongoing, followed by cardiology.  Recent EF 55-60%.  At this time continue current medication regimen as prescribed by cardiology, refill sent on some.  Recommend: - Reminded to call for an overnight weight gain of >2 pounds or a weekly weight gain of >5 pounds - not adding salt to food and read food labels. Reviewed the importance of keeping daily sodium intake to 2000mg  daily.

## 2020-07-27 NOTE — Progress Notes (Signed)
BP 132/84 (BP Location: Left Arm)   Pulse 78   Temp 98.6 F (37 C) (Oral)   Wt 189 lb 3.2 oz (85.8 kg)   SpO2 99%   BMI 32.48 kg/m    Subjective:    Patient ID: Gerald Hodges, male    DOB: 02-04-1936, 85 y.o.   MRN: [id number]  HPI: Gerald Hodges is a 85 y.o. male  Chief Complaint  Patient presents with  . Medication Refill    Patient son states his father needs a prescription for his Gout. States patient use to take it before and he has had recent flares up.   . Allergic Rhinitis     Patient son states he has been having cough for the past 2 months and states he feels as if something is stuck and itching his throat and he tries to push it down to see if anything comes up.    Son at bedside to assist with patient HPI.  HYPERTENSION / HYPERLIPIDEMIA Continues on Carvedilol, Lasix, Plavix, Losartan, Imdur, Amlodipine, and NTG as needed.  Followed by cardiology and last seen 06/09/20.  Last echo noted EF 55-60% in October 2021.  Recent cardiology visit statin was held for a "holiday" due to muscle pain with this (CK was normal and LDL last 44) and ASA stopped.  Per review note could consider Crestor if elevations LDL present.   Satisfied with current treatment? yes Duration of hypertension: chronic BP monitoring frequency: not checking BP range:  BP medication side effects: no Duration of hyperlipidemia: chronic Cholesterol medication side effects: no Cholesterol supplements: none Medication compliance: good compliance Aspirin: no Recent stressors: no Recurrent headaches: no Visual changes: no Palpitations: no Dyspnea: no Chest pain: no Lower extremity edema: occasional Dizzy/lightheaded: no   CHRONIC KIDNEY DISEASE Recent CRT 1.57 and eGFR 44 -- remain stable. Takes Allopurinol for gout flares in past, none recently. CKD status: stable Medications renally dose: yes Previous renal evaluation: no Pneumovax:  Up to Date Influenza Vaccine:  Up to  Date   HYPOTHYROIDISM Continues on Levothyroxine 25 MCG daily.  TSH on 05/26/20 with cardiology was 5.840. Thyroid control status:stable Satisfied with current treatment? yes Medication side effects: no Medication compliance: good compliance Etiology of hypothyroidism:  Recent dose adjustment:no Fatigue: no Cold intolerance: no Heat intolerance: no Weight gain: no Weight loss: no Constipation: no Diarrhea/loose stools: no Palpitations: no Lower extremity edema: no Anxiety/depressed mood: no   GERD Taking Protonix 40 MG daily.  Does have allergies to pollen and reports more issues with this, needs refills on Zyrtec.  Has a dry cough with itchy throat. GERD control status: stable  Satisfied with current treatment? yes Heartburn frequency: none Medication side effects: no  Medication compliance: stable Dysphagia: no Odynophagia:  no Hematemesis: no Blood in stool: no EGD: yes  Relevant past medical, surgical, family and social history reviewed and updated as indicated. Interim medical history since our last visit reviewed. Allergies and medications reviewed and updated.  Review of Systems  Constitutional: Negative for activity change, diaphoresis, fatigue and fever.  Respiratory: Negative for cough, chest tightness, shortness of breath and wheezing.   Cardiovascular: Negative for chest pain, palpitations and leg swelling.  Gastrointestinal: Negative.   Endocrine: Negative for cold intolerance, heat intolerance, polydipsia, polyphagia and polyuria.  Musculoskeletal: Negative.   Skin: Negative.   Neurological: Negative.   Psychiatric/Behavioral: Negative.     Per HPI unless specifically indicated above     Objective:    BP 132/84 (BP  Location: Left Arm)   Pulse 78   Temp 98.6 F (37 C) (Oral)   Wt 189 lb 3.2 oz (85.8 kg)   SpO2 99%   BMI 32.48 kg/m   Wt Readings from Last 3 Encounters:  07/27/20 189 lb 3.2 oz (85.8 kg)  06/09/20 191 lb (86.6 kg)  05/26/20  192 lb 4 oz (87.2 kg)    Physical Exam Vitals and nursing note reviewed.  Constitutional:      General: He is awake. He is not in acute distress.    Appearance: He is well-developed and well-groomed. He is obese. He is not ill-appearing or toxic-appearing.  HENT:     Head: Normocephalic and atraumatic.     Right Ear: Hearing normal. No drainage.     Left Ear: Hearing normal. No drainage.  Eyes:     General: Lids are normal.        Right eye: No discharge.        Left eye: No discharge.     Conjunctiva/sclera: Conjunctivae normal.     Pupils: Pupils are equal, round, and reactive to light.  Neck:     Thyroid: No thyromegaly.     Vascular: No carotid bruit.     Trachea: Trachea normal.  Cardiovascular:     Rate and Rhythm: Normal rate and regular rhythm.     Heart sounds: Normal heart sounds, S1 normal and S2 normal. No murmur heard. No gallop.   Pulmonary:     Effort: Pulmonary effort is normal. No accessory muscle usage or respiratory distress.     Breath sounds: Normal breath sounds.  Abdominal:     General: Bowel sounds are normal.     Palpations: Abdomen is soft. There is no hepatomegaly or splenomegaly.  Musculoskeletal:        General: Normal range of motion.     Cervical back: Normal range of motion and neck supple.     Right lower leg: No edema.     Left lower leg: No edema.  Lymphadenopathy:     Cervical: No cervical adenopathy.  Skin:    General: Skin is warm and dry.     Capillary Refill: Capillary refill takes less than 2 seconds.  Neurological:     Mental Status: He is alert and oriented to person, place, and time.     Deep Tendon Reflexes: Reflexes are normal and symmetric.     Reflex Scores:      Brachioradialis reflexes are 2+ on the right side and 2+ on the left side.      Patellar reflexes are 2+ on the right side and 2+ on the left side. Psychiatric:        Attention and Perception: Attention normal.        Mood and Affect: Mood normal.         Speech: Speech normal.        Behavior: Behavior normal. Behavior is cooperative.        Thought Content: Thought content normal.    Results for orders placed or performed in visit on 05/26/20  Comprehensive metabolic panel  Result Value Ref Range   Glucose 107 (H) 65 - 99 mg/dL   BUN 30 (H) 8 - 27 mg/dL   Creatinine, Ser 1.57 (H) 0.76 - 1.27 mg/dL   eGFR 44 (L) >59 mL/min/1.73   BUN/Creatinine Ratio 19 10 - 24   Sodium 138 134 - 144 mmol/L   Potassium 4.4 3.5 - 5.2 mmol/L   Chloride 99  96 - 106 mmol/L   CO2 22 20 - 29 mmol/L   Calcium 9.9 8.6 - 10.2 mg/dL   Total Protein 7.0 6.0 - 8.5 g/dL   Albumin 4.8 (H) 3.6 - 4.6 g/dL   Globulin, Total 2.2 1.5 - 4.5 g/dL   Albumin/Globulin Ratio 2.2 1.2 - 2.2   Bilirubin Total 1.0 0.0 - 1.2 mg/dL   Alkaline Phosphatase 95 44 - 121 IU/L   AST 25 0 - 40 IU/L   ALT 19 0 - 44 IU/L  CBC  Result Value Ref Range   WBC 4.2 3.4 - 10.8 x10E3/uL   RBC 4.82 4.14 - 5.80 x10E6/uL   Hemoglobin 15.2 13.0 - 17.7 g/dL   Hematocrit 43.1 37.5 - 51.0 %   MCV 89 79 - 97 fL   MCH 31.5 26.6 - 33.0 pg   MCHC 35.3 31.5 - 35.7 g/dL   RDW 13.1 11.6 - 15.4 %   Platelets 276 150 - 450 x10E3/uL  TSH  Result Value Ref Range   TSH 5.840 (H) 0.450 - 4.500 uIU/mL  Lipid panel  Result Value Ref Range   Cholesterol, Total 105 100 - 199 mg/dL   Triglycerides 176 (H) 0 - 149 mg/dL   HDL 32 (L) >39 mg/dL   VLDL Cholesterol Cal 29 5 - 40 mg/dL   LDL Chol Calc (NIH) 44 0 - 99 mg/dL   Chol/HDL Ratio 3.3 0.0 - 5.0 ratio  CK Total (and CKMB)  Result Value Ref Range   Total CK 198 30 - 208 U/L   CK-MB Index 2.7 0.0 - 10.4 ng/mL  Sedimentation rate  Result Value Ref Range   Sed Rate 2 0 - 30 mm/hr      Assessment & Plan:   Problem List Items Addressed This Visit      Cardiovascular and Mediastinum   Essential hypertension    Chronic, ongoing with initial BP elevated today, but repeat at goal for his age.  Recommend he monitor BP at least a few mornings a week at  home and document.  DASH diet at home.  Continue current medication regimen and adjust as needed.  Labs in March with cardiology.  Return in 6 months.       Relevant Medications   furosemide (LASIX) 40 MG tablet   amLODipine (NORVASC) 5 MG tablet   losartan (COZAAR) 50 MG tablet   Stable angina (HCC)    Chronic, ongoing, followed by cardiology.  Continue this collaboration and current medication regiment as prescribed by them.  Recent note reviewed.      Relevant Medications   furosemide (LASIX) 40 MG tablet   amLODipine (NORVASC) 5 MG tablet   losartan (COZAAR) 50 MG tablet   CAD (coronary artery disease)    Chronic, ongoing, followed by cardiology.  Continue this collaboration and current medication regiment as prescribed by them.  Recent note reviewed.      Relevant Medications   furosemide (LASIX) 40 MG tablet   amLODipine (NORVASC) 5 MG tablet   losartan (COZAAR) 50 MG tablet   (HFpEF) heart failure with preserved ejection fraction (HCC) - Primary    Chronic, ongoing, followed by cardiology.  Recent EF 55-60%.  At this time continue current medication regimen as prescribed by cardiology, refill sent on some.  Recommend: - Reminded to call for an overnight weight gain of >2 pounds or a weekly weight gain of >5 pounds - not adding salt to food and read food labels. Reviewed the importance of keeping  daily sodium intake to <2019m daily.      Relevant Medications   furosemide (LASIX) 40 MG tablet   amLODipine (NORVASC) 5 MG tablet   losartan (COZAAR) 50 MG tablet     Respiratory   Allergic rhinitis    Chronic, ongoing.  Recommend continue Zyrtec and will add on Flonase for his occasional dry cough and throat pruritus.  Educated on this.       Relevant Medications   cetirizine (ZYRTEC) 5 MG tablet     Digestive   GERD (gastroesophageal reflux disease)    Chronic, ongoing with use of Protonix, would benefit reduction of this in future.  At this time continue current  medication regimen and adjust as needed.  Mag level today.      Relevant Medications   pantoprazole (PROTONIX) 40 MG tablet   Other Relevant Orders   Magnesium     Endocrine   Hypothyroidism    Chronic, ongoing.  Continue current medication regimen and adjust as needed.  Will send in only 30 tablets on refills today, as if elevations in TSH will increase Levothyroxine to 50 MCG.  Recent TSH elevated.  Repeat TSH and Free T4 today -- discussed with his son.         Relevant Medications   levothyroxine (SYNTHROID) 25 MCG tablet   Other Relevant Orders   T4, free   TSH     Genitourinary   Chronic kidney disease, stage III (moderate) (HCC)    Chronic, ongoing, remains stable on recent labs with cardiology.  Continue to monitor and consider referral to nephrology if any worsening.        Other   Gout    Chronic, stable with use of daily Allopurinol.  Check uric acid level today and refills sent in.      Relevant Medications   allopurinol (ZYLOPRIM) 100 MG tablet   Other Relevant Orders   Uric acid   Hyperlipidemia LDL goal <70    Chronic, ongoing. Currently on statin holiday due to myalgias, per cardiology note.  Will recheck levels today, recent LDL 44.  If LDL remains <70 could consider continuing holiday, if elevations could consider Crestor on 3 day a week schedule.        Relevant Medications   furosemide (LASIX) 40 MG tablet   amLODipine (NORVASC) 5 MG tablet   losartan (COZAAR) 50 MG tablet   Other Relevant Orders   Lipid Panel w/o Chol/HDL Ratio       Follow up plan: Return in about 6 months (around 01/27/2021) for HTN/HLD/HF, THYROID, GOUT, GERD.

## 2020-07-27 NOTE — Assessment & Plan Note (Signed)
Chronic, ongoing.  Continue current medication regimen and adjust as needed.  Will send in only 30 tablets on refills today, as if elevations in TSH will increase Levothyroxine to 50 MCG.  Recent TSH elevated.  Repeat TSH and Free T4 today -- discussed with his son.

## 2020-07-27 NOTE — Assessment & Plan Note (Signed)
Chronic, ongoing with initial BP elevated today, but repeat at goal for his age.  Recommend he monitor BP at least a few mornings a week at home and document.  DASH diet at home.  Continue current medication regimen and adjust as needed.  Labs in March with cardiology.  Return in 6 months.

## 2020-07-27 NOTE — Assessment & Plan Note (Signed)
Chronic, ongoing. Currently on statin holiday due to myalgias, per cardiology note.  Will recheck levels today, recent LDL 44.  If LDL remains <70 could consider continuing holiday, if elevations could consider Crestor on 3 day a week schedule.

## 2020-07-27 NOTE — Assessment & Plan Note (Signed)
Chronic, stable with use of daily Allopurinol.  Check uric acid level today and refills sent in.

## 2020-07-27 NOTE — Assessment & Plan Note (Signed)
Chronic, ongoing.  Recommend continue Zyrtec and will add on Flonase for his occasional dry cough and throat pruritus.  Educated on this.

## 2020-07-27 NOTE — Assessment & Plan Note (Signed)
Chronic, ongoing, remains stable on recent labs with cardiology.  Continue to monitor and consider referral to nephrology if any worsening.

## 2020-07-27 NOTE — Assessment & Plan Note (Signed)
Chronic, ongoing with use of Protonix, would benefit reduction of this in future.  At this time continue current medication regimen and adjust as needed.  Mag level today.

## 2020-07-28 ENCOUNTER — Other Ambulatory Visit: Payer: Self-pay | Admitting: Nurse Practitioner

## 2020-07-28 LAB — LIPID PANEL W/O CHOL/HDL RATIO
Cholesterol, Total: 158 mg/dL (ref 100–199)
HDL: 35 mg/dL — ABNORMAL LOW (ref >39)
LDL Chol Calc (NIH): 92 mg/dL (ref 0–99)
Triglycerides: 181 mg/dL — ABNORMAL HIGH (ref 0–149)
VLDL Cholesterol Cal: 31 mg/dL (ref 5–40)

## 2020-07-28 LAB — URIC ACID: Uric Acid: 8.4 mg/dL (ref 3.8–8.4)

## 2020-07-28 LAB — T4, FREE: Free T4: 1.11 ng/dL (ref 0.82–1.77)

## 2020-07-28 LAB — TSH: TSH: 4.97 u[IU]/mL — ABNORMAL HIGH (ref 0.450–4.500)

## 2020-07-28 LAB — MAGNESIUM: Magnesium: 1.7 mg/dL (ref 1.6–2.3)

## 2020-07-28 MED ORDER — ROSUVASTATIN CALCIUM 10 MG PO TABS: ORAL_TABLET | ORAL | 4 refills | Status: DC

## 2020-07-28 NOTE — Progress Notes (Signed)
Please let Collin's son know labs have returned and are overall stable.  Thyroid testing shows TSH mildly elevated, but is trending down to normal.  Continue current Levothyroxine dosing and we will recheck next visit.  Cholesterol levels have trended up without medication, I do recommend we trial Rosuvastatin 10 MG on a 3 day a week schedule, would they be okay with trying this?  If muscle pains let me know right away.  Any questions? Keep being awesome!!  Thank you for allowing me to participate in your care.  I appreciate you. Kindest regards, Lewanna Petrak

## 2020-09-08 ENCOUNTER — Telehealth: Payer: Self-pay | Admitting: Surgery

## 2020-09-08 ENCOUNTER — Telehealth: Payer: Self-pay

## 2020-09-08 ENCOUNTER — Ambulatory Visit: Payer: Medicaid Other | Admitting: Surgery

## 2020-09-08 ENCOUNTER — Telehealth: Payer: Self-pay | Admitting: Internal Medicine

## 2020-09-08 ENCOUNTER — Other Ambulatory Visit: Payer: Self-pay

## 2020-09-08 ENCOUNTER — Encounter: Payer: Self-pay | Admitting: Surgery

## 2020-09-08 VITALS — BP 149/90 | HR 85 | Temp 98.3°F | Ht 65.0 in | Wt 186.4 lb

## 2020-09-08 DIAGNOSIS — K429 Umbilical hernia without obstruction or gangrene: Secondary | ICD-10-CM | POA: Diagnosis not present

## 2020-09-08 NOTE — Patient Instructions (Addendum)
Our surgery scheduler Britta Mccreedy will call you within 24-48 hours to get you scheduled. If you have not heard from her after 48 hours, please call our office. You will not need to get Covid tested before surgery and have the blue sheet available when she calls to write down important information.   Please STOP Plavix on September 15, 2020.   If you have any concerns or questions, please feel free to call our office.     Umbilical Hernia, Adult  A hernia is a bulge of tissue that pushes through an opening between muscles. An umbilical hernia happens in the abdomen, near the belly button (umbilicus). The hernia may contain tissues from the small intestine, large intestine, or fatty tissue covering the intestines (omentum). Umbilical hernias in adults tend to get worse over time, and they requiresurgical treatment. There are several types of umbilical hernias. You may have: A hernia located just above or below the umbilicus (indirect hernia). This is the most common type of umbilical hernia in adults. A hernia that forms through an opening formed by the umbilicus (direct hernia). A hernia that comes and goes (reducible hernia). A reducible hernia may be visible only when you strain, lift something heavy, or cough. This type of hernia can be pushed back into the abdomen (reduced). A hernia that traps abdominal tissue inside the hernia (incarcerated hernia). This type of hernia cannot be reduced. A hernia that cuts off blood flow to the tissues inside the hernia (strangulated hernia). The tissues can start to die if this happens. This type of hernia requires emergency treatment. What are the causes? An umbilical hernia happens when tissue inside the abdomen presses on a weakarea of the abdominal muscles. What increases the risk? You may have a greater risk of this condition if you: Are obese. Have had several pregnancies. Have a buildup of fluid inside your abdomen (ascites). Have had surgery that  weakens the abdominal muscles. What are the signs or symptoms? The main symptom of this condition is a painless bulge at or near the belly button. A reducible hernia may be visible only when you strain, lift something heavy, or cough. Other symptoms may include: Dull pain. A feeling of pressure. Symptoms of a strangulated hernia may include: Pain that gets increasingly worse. Nausea and vomiting. Pain when pressing on the hernia. Skin over the hernia becoming red or purple. Constipation. Blood in the stool. How is this diagnosed? This condition may be diagnosed based on: A physical exam. You may be asked to cough or strain while standing. These actions increase the pressure inside your abdomen and force the hernia through the opening in your muscles. Your health care provider may try to reduce the hernia by pressing on it. Your symptoms and medical history. How is this treated? Surgery is the only treatment for an umbilical hernia. Surgery for a strangulated hernia is done as soon as possible. If you have a small herniathat is not incarcerated, you may need to lose weight before having surgery. Follow these instructions at home: Lose weight, if told by your health care provider. Do not try to push the hernia back in. Watch your hernia for any changes in color or size. Tell your health care provider if any changes occur. You may need to avoid activities that increase pressure on your hernia. Do not lift anything that is heavier than 10 lb (4.5 kg) until your health care provider says that this is safe. Take over-the-counter and prescription medicines only as told  by your health care provider. Keep all follow-up visits as told by your health care provider. This is important. Contact a health care provider if: Your hernia gets larger. Your hernia becomes painful. Get help right away if: You develop sudden, severe pain near the area of your hernia. You have pain as well as nausea or  vomiting. You have pain and the skin over your hernia changes color. You develop a fever. This information is not intended to replace advice given to you by your health care provider. Make sure you discuss any questions you have with your healthcare provider. Document Revised: 04/11/2017 Document Reviewed: 08/28/2016 Elsevier Patient Education  2021 [employer].

## 2020-09-08 NOTE — Progress Notes (Signed)
09/08/2020  History of Present Illness: Gerald Hodges is a 85 y.o. male s/p robotic assisted left inguinal hernia repair and open umbilical hernia repair on 01/01/20.  He presents today for follow up because of new onset of umbilical pain.  The patient reports that about 2-3 months ago he started having worsening discomfort in the umbilical area, and has noted new bulging of the area.  He describes that he's been dealing with a cough for the past month and this aggravates the discomfort and bulging even more.  He reports having issues with nausea/vomiting at times due to the pain.  Denies any constipation or diarrhea or abdominal distention.  However, he does report that when he's coughing hard, he can feel the bulging to be much bigger.  He wears an abdominal binder for comfort, and also pushes on the hernia site when he coughs.  Denies any fevers, chills, chest pain, shortness of breath.  Past Medical History: Past Medical History:  Diagnosis Date   Anginal pain (HCC)    Arthritis Left knee   Chronic kidney disease    STAGE 3   Coronary artery disease    GERD (gastroesophageal reflux disease)    Gout    History of kidney problems    Hyperlipemia 08/18/2014   Hyperlipidemia    Hypertension    Hypothyroidism    TIA (transient ischemic attack)    Total knee replacement status 10/25/2016     Past Surgical History: Past Surgical History:  Procedure Laterality Date   BUNIONECTOMY Bilateral    EYE SURGERY Bilateral    Cataractt Extraction with IOL   JOINT REPLACEMENT     TOTAL KNEE ARTHROPLASTY Left 10/25/2016   Procedure: TOTAL KNEE ARTHROPLASTY;  Surgeon: Deeann Saint, MD;  Location: [facility] ORS;  Service: Orthopedics;  Laterality: Left;   UMBILICAL HERNIA REPAIR N/A 01/01/2020   Procedure: HERNIA REPAIR UMBILICAL ADULT;  Surgeon: Henrene Dodge, MD;  Location: [facility] OR[facility];  Service: General;  Laterality: N/A;   XI ROBOTIC ASSISTED INGUINAL HERNIA REPAIR WITH MESH Left 01/01/2020    Procedure: XI ROBOTIC ASSISTED INGUINAL HERNIA REPAIR WITH MESH;  Surgeon: Henrene Dodge, MD;  Location: [facility];  Service: General;  Laterality: Left;    Home Medications: Prior to Admission medications   Medication Sig Start Date End Date Taking? Authorizing Provider  allopurinol (ZYLOPRIM) 100 MG tablet TAKE 1 TABLET(100 MG) BY MOUTH DAILY 07/27/20  Yes Cannady, Jolene T, NP  amLODipine (NORVASC) 5 MG tablet Take 1 tablet (5 mg total) by mouth daily. 07/27/20  Yes Cannady, Jolene T, NP  carvedilol (COREG) 6.25 MG tablet TAKE 1 TABLET(6.25 MG) BY MOUTH TWICE DAILY WITH A MEAL 05/24/20  Yes End, Cristal Deer, MD  clopidogrel (PLAVIX) 75 MG tablet Take 1 tablet (75 mg total) by mouth daily. 07/27/20  Yes Cannady, Jolene T, NP  fluticasone (FLONASE) 50 MCG/ACT nasal spray Place 2 sprays into both nostrils daily. 07/27/20  Yes Cannady, Jolene T, NP  furosemide (LASIX) 40 MG tablet Take 1 tablet (40 mg total) by mouth daily as needed for edema (weight gain, shortness of breath). 07/27/20  Yes Cannady, Jolene T, NP  isosorbide mononitrate (IMDUR) 60 MG 24 hr tablet Take 1.5 tablets (90 mg total) by mouth daily. 05/26/20 09/08/20 Yes End, Cristal Deer, MD  levothyroxine (SYNTHROID) 25 MCG tablet Take 1 tablet (25 mcg total) by mouth daily before breakfast. 07/27/20  Yes Cannady, Jolene T, NP  losartan (COZAAR) 50 MG tablet Take 1 tablet (50 mg total) by mouth daily.  07/27/20  Yes Cannady, Jolene T, NP  nitroGLYCERIN (NITROSTAT) 0.4 MG SL tablet Place 1 tablet (0.4 mg total) under the tongue every 5 (five) minutes as needed for chest pain (chest pain). Maximum of 3 doses. 04/11/19  Yes Dunn, Raymon Mutton, PA-C  pantoprazole (PROTONIX) 40 MG tablet Take 1 tablet (40 mg total) by mouth daily. 07/27/20  Yes Cannady, Corrie Dandy T, NP  rosuvastatin (CRESTOR) 10 MG tablet Take 10 MG (one tablet) by mouth 3 days a week (Monday, Wednesday, Friday). 07/28/20  Yes Cannady, Jolene T, NP  cetirizine (ZYRTEC) 5 MG tablet Take 1 tablet (5  mg total) by mouth daily. 07/27/20   Marjie Skiff, NP    Allergies: Allergies  Allergen Reactions   Aspirin Cough and Other (See Comments)   Lisinopril Itching    Throat itching     Review of Systems: Review of Systems  Constitutional:  Negative for chills and fever.  Respiratory:  Positive for cough. Negative for shortness of breath.   Cardiovascular:  Negative for chest pain.  Gastrointestinal:  Positive for abdominal pain, nausea and vomiting. Negative for constipation and diarrhea.  Genitourinary:  Negative for dysuria.  Musculoskeletal:  Negative for myalgias.  Skin:  Negative for rash.   Physical Exam BP (!) 149/90   Pulse 85   Temp 98.3 F (36.8 C) (Oral)   Ht 5\' 5"  (1.651 m)   Wt 186 lb 6.4 oz (84.6 kg)   SpO2 98%   BMI 31.02 kg/m  CONSTITUTIONAL: No acute distress, well nourished HEENT:  Normocephalic, atraumatic, extraocular motion intact. RESPIRATORY:  Lungs are clear, and breath sounds are equal bilaterally. Normal respiratory effort without pathologic use of accessory muscles. CARDIOVASCULAR: Heart is regular without murmurs, gallops, or rubs. GI: The abdomen is soft, non-distended, with some discomfort to palpation in the umbilical area.  His prior incisions are well healed.  He does have a 3 cm umbilical hernia defect.  When he strains, can feel small bowel bulging through the defect.  He also has diastasis recti. NEUROLOGIC:  Motor and sensation is grossly normal.  Cranial nerves are grossly intact. PSYCH:  Alert and oriented to person, place and time. Affect is normal.  Labs/Imaging: Labs from 05/26/20: Na 138, K 4.4, Cl 99, CO2 22, BUN 30, Cr 1.57.  LFTs within normal.  WBC 4.2, Hgb 15.2, Hct 43.1, Plt 276.  Assessment and Plan: This is a 85 y.o. male with a recurrent umbilical hernia  --Discussed with the patient that he indeed has a recurrent umbilical hernia.  He feels that all his coughing has contributed to it and I agree that this has played a  factor, particularly with the associated diastasis recti.  Due to his comorbidities, I still think that trying a full plication of his diastasis and do an umbilical hernia repair may be more complex for him.  However, his umbilical hernia now has small bowel bulging through and this should be repaired.  Discussed with him that we could proceed with a robotic umbilical hernia repair, and use mesh to reinforce the repair given the diastasis.  He is in agreement with this plan.  Discussed with him the risks of bleeding, infection, injury to surrounding structures. --Will send medical and cardiology clearance.  Also recommended that he see his PCP for his prolonged cough.  He may need antibiotics if there's concern for pneumonia, though his lungs sound clear. --He's on Plavix and would need to hold this for surgery, ideally for 7 days prior to  surgery, but will defer to cardiology as part of the clearance. --Will tentatively schedule him for robotic umbilical hernia repair on 09/23/20.  Face-to-face time spent with the patient and care providers was 40 minutes, with more than 50% of the time spent counseling, educating, and coordinating care of the patient.     Howie Ill, MD [facility]

## 2020-09-08 NOTE — Telephone Encounter (Signed)
Faxed Medica Clearance to Dr. Aura Dials at [phone].   Faxed Cardiac Clearance to Dr. Cristal Deer End at [phone].

## 2020-09-08 NOTE — Telephone Encounter (Signed)
Outgoing call is made, spoke with son, Lyn Hollingshead.  He has been advised of Pre-Admission date/time, COVID Testing date and Surgery date.  Surgery Date: 09/23/20 Preadmission Testing Date: 09/14/20 (phone 1p-5p) Covid Testing Date: Not needed.     Patient has been made aware to call [phone], between 1-3:00pm the day before surgery, to find out what time to arrive for surgery.

## 2020-09-08 NOTE — Telephone Encounter (Signed)
Spoke with Tiffany @ Dr.cannady office-Medical clearance has been received by Gerald Hodges.

## 2020-09-08 NOTE — Telephone Encounter (Signed)
   [facility] Pre-operative Risk Assessment    Patient Name: Gerald Hodges  DOB: 04-Apr-1935  MRN: [id number]   HEARTCARE STAFF: - Please ensure there is not already an duplicate clearance open for this procedure. - Under Visit Info/Reason for Call, type in Other and utilize the format Clearance MM/DD/YY or Clearance TBD. Do not use dashes or single digits. - If request is for dental extraction, please clarify the # of teeth to be extracted. - If the patient is currently at the dentist's office, call Pre-Op APP to address. If the patient is not currently in the dentist office, please route to the Pre-Op pool  Request for surgical clearance:  What type of surgery is being performed? Umbilical hernia repair  When is this surgery scheduled? 09/23/20  What type of clearance is required (medical clearance vs. Pharmacy clearance to hold med vs. Both)? Not noted  Are there any medications that need to be held prior to surgery and how long? Not noted  Practice name and name of physician performing surgery? [facility] Dr. Hampton Abbot  What is the office phone number? [phone]   7.   What is the office fax number? [phone]  8.   Anesthesia type (None, local, MAC, general) ? general   Marykay Lex 09/08/2020, 12:37 PM  _________________________________________________________________   (provider comments below)

## 2020-09-09 ENCOUNTER — Telehealth: Payer: Self-pay

## 2020-09-09 NOTE — Telephone Encounter (Signed)
From Dr. Aleen Campi: I was looking at this patient's chart while doing my office note from today's visit.  Just wanted to give you more information for the clearance.  Surgery is a robotic (laparoscopic) assisted umbilical hernia repair.  Seeking cardiologyclearance and also clearance to hold his Plavix for surgery.

## 2020-09-09 NOTE — Telephone Encounter (Signed)
Left VM  Takes plavix for TIA.

## 2020-09-09 NOTE — Telephone Encounter (Signed)
Laqueta Due, pt's son, to schedule surgical clearance appt. No answer left vm surgery is scheduled for 7/14

## 2020-09-10 ENCOUNTER — Ambulatory Visit: Payer: Medicaid Other | Admitting: Nurse Practitioner

## 2020-09-10 ENCOUNTER — Other Ambulatory Visit: Payer: Self-pay

## 2020-09-10 ENCOUNTER — Encounter: Payer: Self-pay | Admitting: Nurse Practitioner

## 2020-09-10 VITALS — BP 131/78 | HR 72 | Temp 97.6°F

## 2020-09-10 DIAGNOSIS — E039 Hypothyroidism, unspecified: Secondary | ICD-10-CM

## 2020-09-10 DIAGNOSIS — Z01818 Encounter for other preprocedural examination: Secondary | ICD-10-CM | POA: Diagnosis not present

## 2020-09-10 DIAGNOSIS — R053 Chronic cough: Secondary | ICD-10-CM

## 2020-09-10 DIAGNOSIS — Z8616 Personal history of COVID-19: Secondary | ICD-10-CM

## 2020-09-10 HISTORY — DX: Personal history of COVID-19: Z86.16

## 2020-09-10 MED ORDER — AMOXICILLIN-POT CLAVULANATE 875-125 MG PO TABS: 1.0000 | ORAL_TABLET | Freq: Two times a day (BID) | ORAL | 0 refills | Status: AC

## 2020-09-10 MED ORDER — ALBUTEROL SULFATE HFA 108 (90 BASE) MCG/ACT IN AERS: 2.0000 | INHALATION_SPRAY | Freq: Four times a day (QID) | RESPIRATORY_TRACT | 1 refills | Status: DC | PRN

## 2020-09-10 MED ORDER — LEVOTHYROXINE SODIUM 25 MCG PO TABS
25.0000 ug | ORAL_TABLET | Freq: Every day | ORAL | 0 refills | Status: DC
Start: 2020-09-10 — End: 2020-09-12

## 2020-09-10 MED ORDER — BENZONATATE 100 MG PO CAPS: 100.0000 mg | ORAL_CAPSULE | Freq: Three times a day (TID) | ORAL | 0 refills | Status: DC | PRN

## 2020-09-10 NOTE — Progress Notes (Signed)
BP 131/78   Pulse 72   Temp 97.6 F (36.4 C) (Oral)   SpO2 93%    Subjective:    Patient ID: Gerald Hodges, male    DOB: 1935-05-09, 85 y.o.   MRN: [id number]  HPI: Gerald Hodges is a 85 y.o. male  Chief Complaint  Patient presents with   Cough    Pt states he is still having a cough. States it has been going on for 2 months. States if the cough does not go away, he cannot have his upcoming surgery. States the cough is worse at night.    Surgery Clearance    Upcoming hernia surgery on 09/23/20, sees Cardiology as well.    Interpreter at bedside and assisted with interpretation.  Also has son at bedside.  PRE-OP FOR HERNIA: Scheduled for umbilical hernia repair on 09/23/20, needs labs today.  Sees cardiology for EKG checks.  Last visit with them 05/26/20.  He reports today that if he continues to have cough they will not do surgery.  COUGH Has had the cough ongoing since May 2022, initially suspected it was allergy related and he restarted his Zyrtec.  Reports it is itchy in his throat and a dry cough.  His cough went away for one week after Zyrtec and then returned and has not gone away.  He reports Zyrtec not helping.  There is nothing he can think of that makes cough worse.  Was never a big smoker, only occasional as made him feel bad.  Worked in [employer] and where [place] lives [place].  No pet birds at home.  Had Covid one year ago.  Has HF, but denies any SOB with activity or edema.  Recent TSH mildly elevated, but continued current Levothyroxine dosing.  Cough started in [place] as was around [place] the dog -- thinks it was smell of dog that caused it.   Duration: months Circumstances of initial development of cough: nothing Cough severity: moderate Cough description: non-productive and dry Aggravating factors:  worse at night and in heat Alleviating factors: nothing Status:  fluctuating Treatments attempted:  zyrtec and  ginger tea Wheezing: no Shortness of breath: no Chest pain: no Chest tightness: occasional Nasal congestion: no Runny nose: no Postnasal drip: no Frequent throat clearing or swallowing: yes feels it is clearing up a little -- voice was hoarser Hemoptysis: no Fevers: no Night sweats: yes Weight loss:  noted on weight check today 3 lbs since last visit -- reports not eating as much, less flavor to food Heartburn:  sometimes Recent foreign travel: [place] in April for 3 weeks Tuberculosis contacts: no   Relevant past medical, surgical, family and social history reviewed and updated as indicated. Interim medical history since our last visit reviewed. Allergies and medications reviewed and updated.  Review of Systems  Constitutional:  Negative for activity change, diaphoresis, fatigue and fever.  Respiratory:  Positive for cough. Negative for chest tightness, shortness of breath and wheezing.   Cardiovascular:  Negative for chest pain, palpitations and leg swelling.  Gastrointestinal: Negative.   Endocrine: Negative for cold intolerance and heat intolerance.  Neurological: Negative.   Psychiatric/Behavioral: Negative.     Per HPI unless specifically indicated above     Objective:    BP 131/78   Pulse 72   Temp 97.6 F (36.4 C) (Oral)   SpO2 93%   Wt Readings from Last 3 Encounters:  09/08/20 186 lb 6.4 oz (84.6 kg)  07/27/20 189 lb 3.2 oz (85.8 kg)  06/09/20 191 lb (86.6 kg)    Physical Exam Vitals and nursing note reviewed.  Constitutional:      General: He is awake. He is not in acute distress.    Appearance: He is well-developed and well-groomed. He is obese. He is not ill-appearing or toxic-appearing.  HENT:     Head: Normocephalic and atraumatic.     Right Ear: Hearing normal. No drainage.     Left Ear: Hearing normal. No drainage.  Eyes:     General: Lids are normal.        Right eye: No discharge.        Left eye: No discharge.     Conjunctiva/sclera:  Conjunctivae normal.     Pupils: Pupils are equal, round, and reactive to light.  Neck:     Thyroid: No thyromegaly.     Vascular: No carotid bruit.     Trachea: Trachea normal.  Cardiovascular:     Rate and Rhythm: Normal rate and regular rhythm.     Heart sounds: Normal heart sounds, S1 normal and S2 normal. No murmur heard.   No gallop.  Pulmonary:     Effort: Pulmonary effort is normal. No accessory muscle usage or respiratory distress.     Breath sounds: Normal breath sounds.  Abdominal:     General: Bowel sounds are normal.     Palpations: Abdomen is soft. There is no hepatomegaly or splenomegaly.  Musculoskeletal:        General: Normal range of motion.     Cervical back: Normal range of motion and neck supple.     Right lower leg: No edema.     Left lower leg: No edema.  Lymphadenopathy:     Cervical: No cervical adenopathy.  Skin:    General: Skin is warm and dry.     Capillary Refill: Capillary refill takes less than 2 seconds.  Neurological:     Mental Status: He is alert and oriented to person, place, and time.     Deep Tendon Reflexes: Reflexes are normal and symmetric.     Reflex Scores:      Brachioradialis reflexes are 2+ on the right side and 2+ on the left side.      Patellar reflexes are 2+ on the right side and 2+ on the left side. Psychiatric:        Attention and Perception: Attention normal.        Mood and Affect: Mood normal.        Speech: Speech normal.        Behavior: Behavior normal. Behavior is cooperative.        Thought Content: Thought content normal.    Results for orders placed or performed in visit on 07/27/20  T4, free  Result Value Ref Range   Free T4 1.11 0.82 - 1.77 ng/dL  TSH  Result Value Ref Range   TSH 4.970 (H) 0.450 - 4.500 uIU/mL  Magnesium  Result Value Ref Range   Magnesium 1.7 1.6 - 2.3 mg/dL  Lipid Panel w/o Chol/HDL Ratio  Result Value Ref Range   Cholesterol, Total 158 100 - 199 mg/dL   Triglycerides 294 (H) 0  - 149 mg/dL   HDL 35 (L) >76 mg/dL   VLDL Cholesterol Cal 31 5 - 40 mg/dL   LDL Chol Calc (NIH) 92 0 - 99 mg/dL  Uric acid  Result Value Ref Range   Uric Acid 8.4 3.8 - 8.4  mg/dL      Assessment & Plan:   Problem List Items Addressed This Visit       Endocrine   Hypothyroidism    Chronic, ongoing.  Continue current medication regimen and adjust as needed.  Will send in only 30 tablets on refills today, as if elevations in TSH will increase Levothyroxine to 50 MCG.  Recent TSH elevated.  Repeat TSH and Free T4 today -- discussed with his son.          Relevant Medications   levothyroxine (SYNTHROID) 25 MCG tablet     Other   Chronic cough - Primary    Present for > 2 months, with no improvement with allergy medication regimen. Does endorse some night sweats with this, but no fevers, and noted to have some weight loss over past 2 months.  Will obtain CXR on Tuesday.  Test for TB with Quantiferon on labs and CXR.  Obtains CBC, CMP, Covid antibody.  Send in Augmentin, Tessalon, and Albuterol inhaler -- see if benefit with this as has upcoming surgery and unable to attain if ongoing cough.  Return in one week, if ongoing may need to refer to Dr. Sherene Sires with Pulmonary for further assessment.       Relevant Orders   QuantiFERON-TB Gold Plus   Comprehensive metabolic panel   CBC with Differential/Platelet   TSH   T4, free   SAR CoV2 Serology (COVID 19)AB(IGG)IA   DG Chest 2 View   Other Visit Diagnoses     Preop examination       Will hold off on signing papers until next week due to chronic cough work-up.  Return in 1 week for follow-up.       Time: 30 minutes, >50% spent counseling/or care coordination   Follow up plan: Return in about 1 week (around 09/17/2020) for Chronic cough.

## 2020-09-10 NOTE — Assessment & Plan Note (Signed)
Chronic, ongoing.  Continue current medication regimen and adjust as needed.  Will send in only 30 tablets on refills today, as if elevations in TSH will increase Levothyroxine to 50 MCG.  Recent TSH elevated.  Repeat TSH and Free T4 today -- discussed with his son.    

## 2020-09-10 NOTE — Assessment & Plan Note (Signed)
Present for > 2 months, with no improvement with allergy medication regimen. Does endorse some night sweats with this, but no fevers, and noted to have some weight loss over past 2 months.  Will obtain CXR on Tuesday.  Test for TB with Quantiferon on labs and CXR.  Obtains CBC, CMP, Covid antibody.  Send in Augmentin, Tessalon, and Albuterol inhaler -- see if benefit with this as has upcoming surgery and unable to attain if ongoing cough.  Return in one week, if ongoing may need to refer to Dr. Sherene Sires with Pulmonary for further assessment.

## 2020-09-10 NOTE — Patient Instructions (Signed)
Get Chest x-ray at [address], [place] [address] on Tuesday between 8 am and 5 pm  Tos en los adultos Cough, Adult La tos ayuda a despejar la garganta y los pulmones. La tos puede ser un signode una enfermedad u otra afeccin mdica. Una tos aguda puede durar [place] 2 o 3 semanas, mientras que una tos crnicapuede durar 8 semanas o ms [employer]. Hay muchas cosas que pueden causar tos. Estas incluyen lo siguiente: Grmenes (virus o bacterias) que atacan las vas respiratorias. Inhalacin de cosas que alteran (irritan) los pulmones. Alergias. Asma. Mucosidad que se desliza por la parte posterior de la garganta (goteo posnasal). Fumar. cido que vuelve desde el estmago hacia el tubo que transporta los alimentos desde la boca hasta el estmago (reflujo gastroesofgico). Algunos medicamentos. Problemas pulmonares. Otras enfermedades, como insuficiencia cardaca o un cogulo de sangre en el pulmn (embolia pulmonar). Siga estas instrucciones en su casa: Medicamentos Tome los medicamentos de venta libre y los recetados solamente como se lo haya indicado el mdico. Hable con el mdico antes de tomar medicamentos que detienen la tos (antitusivos). Estilo de vida  No fume y trate de no estar cerca de humo. No consuma ningn producto que contenga nicotina o tabaco, como cigarrillos, cigarrillos electrnicos y tabaco de Theatre manager. Si necesita ayuda para dejar de fumar, consulte al mdico. Beba suficiente lquido para mantener el pis (la [place]) de color amarillo plido. Evite la cafena. No beba alcohol si el mdico se lo prohbe.  Instrucciones generales  Fjese si hay algn cambio en la tos. Informe a su mdico sobre ellos. Siempre cbrase la boca al toser. Aljese de las cosas que lo hagan toser, como perfumes, velas, humo de fogatas o productos de limpieza. Si el aire es [employer], use un humidificador o un vaporizador de niebla fra en su hogar. Si la tos empeora por la noche, pruebe con usar  almohadas adicionales para elevar la cabeza mientras duerme. Descanse todo lo que sea necesario. Concurra a todas las visitas de [address] se lo haya indicado el mdico. Esto es importante.  Comunquese con un mdico si: Aparecen nuevos sntomas. Tose y escupe pus. La tos no mejora despus de 2 o 3 semanas o empeora. Los medicamentos para la tos no [employer] tos y usted no duerme bien. Siente un dolor que empeora o un dolor que no se alivia con medicamentos. Tiene fiebre. Est adelgazando y no sabe por qu. Transpira durante la noche. Solicite ayuda inmediatamente si: Tose y [employer]. Tiene dificultad para respirar. [employer] late [place] rpido. Estos sntomas pueden Customer service manager. No espere a ver si los sntomas desaparecen. Solicite atencin mdica de inmediato. Comunquese con el servicio de emergencias de su localidad (911 en los [place]). No conduzca por sus propios medios [employer]. Resumen La tos ayuda a despejar la garganta y los pulmones. Hay muchas cosas que pueden causar tos. Tome los medicamentos de venta libre y los recetados solamente como se lo haya indicado el mdico. Siempre cbrase la boca al toser. Comunquese con un mdico si tiene sntomas nuevos o tiene una tos que no mejora o que [place]. Esta informacin no tiene Theme park manager el consejo del mdico. Asegresede hacerle al mdico cualquier pregunta que tenga. Document Revised: 04/24/2018 Document Reviewed: 04/24/2018 Elsevier Patient Education  2022 [employer].

## 2020-09-12 ENCOUNTER — Other Ambulatory Visit: Payer: Self-pay | Admitting: Nurse Practitioner

## 2020-09-12 MED ORDER — LEVOTHYROXINE SODIUM 50 MCG PO TABS: 50.0000 ug | ORAL_TABLET | Freq: Every day | ORAL | 4 refills | Status: DC

## 2020-09-12 NOTE — Progress Notes (Signed)
Please let Gerald Hodges and his son know labs have returned, with exception of TB testing which I will alert them when this returns: - Kidney function shows ongoing kidney disease with some mild decline, we will recheck this next visit and recommend adding a little water daily, not too much. - Sodium, salt, level is a little low.  We will recheck this next visit as I do not want you adding too much salt to diet, but just a little more every day. - Sugar was a little high, at next visit I will check to ensure no diabetes present. - CBC shows no anemia - Covid antibodies are positive, but this could also be related to immunizations. - Thyroid testing shows mild elevation, I am going to increase your Levothyroxine to 50 MCG daily and want you to stop 25 MCG dosing.  Take this every morning 30 minutes before food or other medications. Then we will recheck levels in 6 weeks.  Any questions? Keep being awesome!!  Thank you for allowing me to participate in your care.  I appreciate you. Kindest regards, Gerald Hodges

## 2020-09-14 ENCOUNTER — Other Ambulatory Visit: Payer: Self-pay

## 2020-09-14 ENCOUNTER — Ambulatory Visit (INDEPENDENT_AMBULATORY_CARE_PROVIDER_SITE_OTHER): Payer: Medicaid Other | Admitting: Physician Assistant

## 2020-09-14 ENCOUNTER — Other Ambulatory Visit
Admission: RE | Admit: 2020-09-14 | Discharge: 2020-09-14 | Disposition: A | Discharge status: Home / Self Care | Payer: Medicaid Other | Source: Ambulatory Visit | Attending: Surgery | Admitting: Surgery

## 2020-09-14 ENCOUNTER — Encounter: Payer: Self-pay | Admitting: Nurse Practitioner

## 2020-09-14 ENCOUNTER — Telehealth: Payer: Self-pay | Admitting: Nurse Practitioner

## 2020-09-14 ENCOUNTER — Encounter: Payer: Self-pay | Admitting: Physician Assistant

## 2020-09-14 VITALS — BP 112/88 | HR 90 | Wt 188.0 lb

## 2020-09-14 DIAGNOSIS — N183 Chronic kidney disease, stage 3 unspecified: Secondary | ICD-10-CM | POA: Diagnosis not present

## 2020-09-14 DIAGNOSIS — Z789 Other specified health status: Secondary | ICD-10-CM

## 2020-09-14 DIAGNOSIS — E785 Hyperlipidemia, unspecified: Secondary | ICD-10-CM

## 2020-09-14 DIAGNOSIS — Z01810 Encounter for preprocedural cardiovascular examination: Secondary | ICD-10-CM

## 2020-09-14 DIAGNOSIS — I2510 Atherosclerotic heart disease of native coronary artery without angina pectoris: Secondary | ICD-10-CM

## 2020-09-14 DIAGNOSIS — I5032 Chronic diastolic (congestive) heart failure: Secondary | ICD-10-CM

## 2020-09-14 DIAGNOSIS — R7612 Nonspecific reaction to cell mediated immunity measurement of gamma interferon antigen response without active tuberculosis: Secondary | ICD-10-CM | POA: Insufficient documentation

## 2020-09-14 DIAGNOSIS — I10 Essential (primary) hypertension: Secondary | ICD-10-CM | POA: Diagnosis not present

## 2020-09-14 DIAGNOSIS — N179 Acute kidney failure, unspecified: Secondary | ICD-10-CM

## 2020-09-14 DIAGNOSIS — G459 Transient cerebral ischemic attack, unspecified: Secondary | ICD-10-CM | POA: Diagnosis not present

## 2020-09-14 LAB — QUANTIFERON-TB GOLD PLUS
QuantiFERON Mitogen Value: >10 IU/mL
QuantiFERON Nil Value: 0.06 IU/mL
QuantiFERON TB1 Ag Value: 0.71 IU/mL
QuantiFERON TB2 Ag Value: 0.68 IU/mL
QuantiFERON-TB Gold Plus: POSITIVE — AB

## 2020-09-14 LAB — CBC WITH DIFFERENTIAL/PLATELET
Basophils Absolute: 0.1 10*3/uL (ref 0.0–0.2)
Basos: 1 %
EOS (ABSOLUTE): 0.3 10*3/uL (ref 0.0–0.4)
Eos: 6 %
Hematocrit: 41.1 % (ref 37.5–51.0)
Hemoglobin: 14.1 g/dL (ref 13.0–17.7)
Immature Grans (Abs): 0 10*3/uL (ref 0.0–0.1)
Immature Granulocytes: 0 %
Lymphocytes Absolute: 2 10*3/uL (ref 0.7–3.1)
Lymphs: 36 %
MCH: 30.9 pg (ref 26.6–33.0)
MCHC: 34.3 g/dL (ref 31.5–35.7)
MCV: 90 fL (ref 79–97)
Monocytes Absolute: 0.5 10*3/uL (ref 0.1–0.9)
Monocytes: 9 %
Neutrophils Absolute: 2.8 10*3/uL (ref 1.4–7.0)
Neutrophils: 48 %
Platelets: 344 10*3/uL (ref 150–450)
RBC: 4.56 x10E6/uL (ref 4.14–5.80)
RDW: 13.1 % (ref 11.6–15.4)
WBC: 5.7 10*3/uL (ref 3.4–10.8)

## 2020-09-14 LAB — T4, FREE: Free T4: 1.12 ng/dL (ref 0.82–1.77)

## 2020-09-14 LAB — COMPREHENSIVE METABOLIC PANEL
ALT: 19 IU/L (ref 0–44)
AST: 24 IU/L (ref 0–40)
Albumin/Globulin Ratio: 1.6 (ref 1.2–2.2)
Albumin: 4.6 g/dL (ref 3.6–4.6)
Alkaline Phosphatase: 151 IU/L — ABNORMAL HIGH (ref 44–121)
BUN/Creatinine Ratio: 22 (ref 10–24)
BUN: 41 mg/dL — ABNORMAL HIGH (ref 8–27)
Bilirubin Total: 0.7 mg/dL (ref 0.0–1.2)
CO2: 21 mmol/L (ref 20–29)
Calcium: 9.9 mg/dL (ref 8.6–10.2)
Chloride: 95 mmol/L — ABNORMAL LOW (ref 96–106)
Creatinine, Ser: 1.88 mg/dL — ABNORMAL HIGH (ref 0.76–1.27)
Globulin, Total: 2.8 g/dL (ref 1.5–4.5)
Glucose: 121 mg/dL — ABNORMAL HIGH (ref 65–99)
Potassium: 4.8 mmol/L (ref 3.5–5.2)
Sodium: 133 mmol/L — ABNORMAL LOW (ref 134–144)
Total Protein: 7.4 g/dL (ref 6.0–8.5)
eGFR: 35 mL/min/{1.73_m2} — ABNORMAL LOW (ref >59)

## 2020-09-14 LAB — SAR COV2 SEROLOGY (COVID19)AB(IGG),IA
SARS-CoV-2 Semi-Quant IgG Ab: >800 AU/mL (ref <13.0)
SARS-CoV-2 Spike Ab Interp: POSITIVE

## 2020-09-14 LAB — TSH: TSH: 5.59 u[IU]/mL — ABNORMAL HIGH (ref 0.450–4.500)

## 2020-09-14 NOTE — Telephone Encounter (Signed)
Primary Cardiologist:Christopher End, MD  Chart reviewed as part of pre-operative protocol coverage. Because of Gerald Hodges's past medical history and time since last visit, he/she will require a follow-up visit in order to better assess preoperative cardiovascular risk.  Pre-op covering staff: - Please schedule appointment and call patient to inform them. - Please contact requesting surgeon's office via preferred method (i.e, phone, fax) to inform them of need for appointment prior to surgery.  If applicable, this message will also be routed to pharmacy pool and/or primary cardiologist for input on holding anticoagulant/antiplatelet agent as requested below so that this information is available at time of patient's appointment.   Ronney Asters, NP  09/14/2020, 11:39 AM

## 2020-09-14 NOTE — Telephone Encounter (Signed)
See OV from today.  

## 2020-09-14 NOTE — Telephone Encounter (Signed)
Quantiferon returned positive.  Spoke to his son, and DPR, Lyn Hollingshead on phone to notify him and alerted him to take father for treatment to health department and further work-up.  Discussed case with Dr. Laural Benes.  CXR has been ordered and were to attend today for this, but they have not as of yet.  Will follow-up Friday.

## 2020-09-14 NOTE — Telephone Encounter (Signed)
Patients son calling in to check status

## 2020-09-14 NOTE — Telephone Encounter (Signed)
I have reviewed the [place] schedule for an appt for pt for pre op. I did not see an available appt before the scheduled procedure date 7/14. Will ask [place] office they may assist in finding an appt for the pt for pre op clearance.

## 2020-09-14 NOTE — Patient Instructions (Addendum)
Your procedure is scheduled on: Thursday September 23, 2020. Su procedimiento est programado para: Jueves 14 de [place] del 2022. Report to Day Surgery inside [facility] 2nd floor (stop by admissions desk first before getting on elevator). Presntese a: Diplomatic Services operational officer del [facility] 2ndo piso registrese primero en Admisiones antes de subir al elevador). To find out your arrival time please call [phone] between 1PM - 3PM on Wednesday September 22, 2020. Para saber su hora de llegada por favor llame al [phone] Eusebio Me la 1PM - 3PM el da: Miercoles 13 de [place] del 2022.  Remember: Instructions that are not followed completely may result in serious medical risk, up to and including death,  or upon the discretion of your surgeon and anesthesiologist your surgery may need to be rescheduled.  Recuerde: Las instrucciones que no se siguen completamente Armed forces logistics/support/administrative officer en un riesgo de salud grave, incluyendo hasta  la [place] o a discrecin de su cirujano y Scientific laboratory technician, su ciruga se puede posponer.   __X_ 1.Do not eat food after midnight the night before your procedure. No    gum chewing or hard candies. You may drink clear liquids up to 2 hours     before you are scheduled to arrive for your surgery- DO not drink clear     Liquids within 2 hours of the start of your surgery.     Clear Liquids include:    water, apple juice without pulp, clear carbohydrate drink such as    Clearfast of Gartorade, Black Coffee or Tea (Do not add anything to coffee or tea).      No coma nada despus de la medianoche de la noche anterior a su    procedimiento. No coma chicles ni caramelos duros. Puede tomar    lquidos claros hasta 2 horas antes de su hora programada de llegada al     hospital para su procedimiento. No tome lquidos claros durante el     transcurso de las 2 horas de su llegada programada al hospital para su     procedimiento, ya que esto puede llevar a que su procedimiento se     retrase o tenga que volver a Magazine features editor.  Los lquidos claros incluyen:          - Agua o jugo de [place] sin pulpa          - Bebidas claras con carbohidratos como ClearFast o Gatorade          - Caf negro o t claro (sin leche, sin cremas, no agregue nada al caf ni al t)  No tome nada que no est en esta lista.  Los pacientes con diabetes tipo 1 y tipo 2 solo deben Printmaker.  Llame a la clnica de PreCare o a la unidad de Same Day Surgery si  tiene alguna pregunta sobre estas instrucciones.              _X__ 2.Do Not Smoke or use e-cigarettes For 24 Hours Prior to Your Surgery.    Do not use any chewable tobacco products for at least 6   hours prior to surgery.    No fume ni use cigarrillos electrnicos durante las 24 horas previas    a su [place].  No use ningn producto de tabaco masticable durante   al menos 6 horas antes de la [place].     __X_ 3. No alcohol for 24 hours before or after surgery.    No tome alcohol durante las 24 horas  antes ni despus de la ciruga.   __X__4. On the morning of surgery brush your teeth with toothpaste and water, you                may rinse your mouth with mouthwash if you wish.  Do not swallow any toothpaste of mouthwash.   En la maana de la [place], cepllese los dientes con pasta de dientes y [place] enjuagarse la boca con enjuague bucal si lo desea. No ingiera ninguna pasta de dientes o enjuague bucal.   __X__ 5. Notify your doctor if there is any change in your medical condition (cold,fever, infections).    Informe a su mdico si hay algn cambio en su condicin mdica  (resfriado, fiebre, infecciones).   Do not wear jewelry, make-up, hairpins, clips or nail polish.  No use joyas, maquillajes, pinzas/ganchos para el cabello ni esmalte de uas.  Do not wear lotions, powders, or perfumes. You may wear deodorant.  No use lociones, polvos o perfumes.  Puede usar desodorante.    Do not shave 48 hours prior to  surgery. Men may shave face and neck.  No se afeite 48 horas antes de la [place].  Los hombres pueden [employer] cara  y el cuello.   Do not bring valuables to the hospital.   No lleve objetos de valor al hospital.  [facility] is not responsible for any belongings or valuables.  [facility] no se hace responsable de ningn tipo de pertenencias u objetos de Licensed conveyancer.               Contacts, dentures or bridgework may not be worn into surgery.  Los lentes de [place], las dentaduras postizas o puentes no se pueden usar en la [place].   Leave your suitcase in the car. After surgery it may be brought to your room.  Deje su maleta en el auto.  Despus de la ciruga podr traerla a su habitacin.   For patients admitted to the hospital, discharge time is determined by your  treatment team.  Para los pacientes que sean ingresados al hospital, el tiempo en el cual se le  dar de alta es determinado por su equipo de [place].   Patients discharged the day of surgery will not be allowed to drive home. A los pacientes que se les da de alta el mismo da de la ciruga no se les permitir conducir a Higher education careers adviser.    __X__ Take these medicines the morning of surgery with A SIP OF WATER:          Tome estas medicinas la maana de la ciruga con UN SORBO DE AGUA:  1. allopurinol (ZYLOPRIM) 100 MG   2. amLODipine (NORVASC) 5 MG  3. carvedilol (COREG) 6.25 MG  4. isosorbide mononitrate (IMDUR) 60 MG     5. levothyroxine (SYNTHROID) 50 MCG  6. pantoprazole (PROTONIX) 40 MG   ____ Fleet Enema (as directed)          Enema de Fleet (segn lo indicado)    __X__ Use CHG Soap as directed          Utilice el jabn de CHG segn lo indicado  __X__ Use inhalers on the day of surgery          Use los inhaladores el da de la ciruga  albuterol (VENTOLIN HFA) 108 (90 Base) MCG/ACT inhaler  ____ Stop metformin 2 days prior to  surgery          Deje de tomar el metformin 2 das antes de la ciruga    ____ Take  1/2 of usual insulin dose the night before surgery and none on the morning of surgery           Tome la mitad de la dosis habitual de insulina la noche antes de la [place] y no tome nada en la maana de la             ciruga  __X__ Teacher, adult education and ask about clopidogrel (PLAVIX) 75 MG since you are not taking this medication.            Llame a su cardiologo y pregunte del clopidogrel (PLAVIX) 75 MG ya que no esta tomando [employer].  __X__ Stop Anti-inflammatories such as Ibuprofen, Aleve, Advil, Motrin, Meloxicam, Ketoralac, Midol, aspirin, Goody's or BC powders.           Deje de tomar antiinflamatorios como Ibuprofen, Aleve, Advil, Motrin, Meloxicam, Ketoralac, Midol, aspirin, Goody's or BC powders.    ____ Stop supplements until after surgery            Deje de tomar suplementos hasta despus de la ciruga  ____ Bring C-Pap to the hospital          Lleve el C-Pap al hospital

## 2020-09-14 NOTE — Patient Instructions (Signed)
Medication Instructions:  No changes at this time.  *If you need a refill on your cardiac medications before your next appointment, please call your pharmacy*   Lab Work: None  If you have labs (blood work) drawn today and your tests are completely normal, you will receive your results only by: MyChart Message (if you have MyChart) OR A paper copy in the mail If you have any lab test that is abnormal or we need to change your treatment, we will call you to review the results.   Testing/Procedures: None   Follow-Up: At [facility], you and your health needs are our priority.  As part of our continuing mission to provide you with exceptional heart care, we have created designated Provider Care Teams.  These Care Teams include your primary Cardiologist (physician) and Advanced Practice Providers (APPs -  Physician Assistants and Nurse Practitioners) who all work together to provide you with the care you need, when you need it.  We recommend signing up for the patient portal called "MyChart".  Sign up information is provided on this After Visit Summary.  MyChart is used to connect with patients for Virtual Visits (Telemedicine).  Patients are able to view lab/test results, encounter notes, upcoming appointments, etc.  Non-urgent messages can be sent to your provider as well.   To learn more about what you can do with MyChart, go to [email or web address].    Your next appointment:   6 month(s)  The format for your next appointment:   In Person  Provider:   Yvonne Kendall, MD or Eula Listen, PA-C

## 2020-09-14 NOTE — Progress Notes (Signed)
Cardiology Office Note    Date:  09/14/2020   ID:  Gerald Hodges, DOB 09-Apr-1935, MRN [id number]  PCP:  Marjie Skiffannady, Jolene T, NP  Cardiologist:  Yvonne Kendallhristopher End, MD  Electrophysiologist:  None   Chief Complaint: Preoperative cardiac risk stratification  History of Present Illness:   Gerald Hodges is a 85 y.o. male with history of CAD based on abnormal myocardial perfusion [facility] with chronic stable angina, TIA, CKD stage IIIb, HTN, HLD, dizziness with possible vertigo, and GERD who presents for preoperative cardiac risk stratification for umbilical and ventral hernia repair with Dr. Aleen CampiPiscoya.   Prior [facility] 10[facility] was without significant ischemia with a small in size, subtle in severity, fixed mid inferolateral and basal defect consistent with possible artifact but could not rule out subtle scar, EF 56%, no significant coronary artery calcification. Study was low risk and unchanged when compared to prior in 09/2012. Echo from 11/2017 showed an EF of 55-60%, mild LVH, moderate focal basal hypertrophy, Gr1DD, mild aortic valve thickening with mild regurgitation, normal RV size and function. When he was seen in 11/2017, he noted some left leg pain/paresthesias and underwent lower extremity ABIs in 11/2017 that were normal bilaterally.  He was [facility] with COVID-19 pneumonia.  He has been noted to have stable chronic exertional dyspnea over the years.  Amlodipine has previously been decreased due to positional dizziness and lower extremity swelling.   He was previously evaluated for preoperative cardiac risk stratification of hernia repair in 11/2019, with recommendation to proceed with Lexiscan MPI to evaluate for high risk ischemia give symptoms.  Subsequent Lexiscan MPI in 12/2019 showed no significant ischemia with a small to moderate-sized region of predominantly fixed defect in the inferolateral wall that was challenging to interpret  in the setting of GI uptake artifact, EF 42%.  CT attenuation corrected images with very mild aortic atherosclerosis and no notable coronary artery calcification.  Overall, this was a low risk study.  Echo in 12/2019 showed an EF of 55-60%, no RWMA, mild LVH, Gr2DD, normal RV systolic function and ventricular cavity size, mild MR, and mild AI.  Based on these studies, he was felt to be acceptable risk for surgery.   He was seen in 05/2020, noting body aches and fatigue, as well as worsening exertional dyspnea and chest pain.  He had been taking Imdur 30 mg, rather than the previously recommended 90 mg.  He preferred to reinitiate Imdur at 90 mg initially, and reserve LHC for refractory symptoms.  Due to myalgias and fatigue, he underwent a statin holiday as well.  He was again noted to have only minimal lower extremity swelling.  He was last seen in last 05/2020, with noted improvement in chest pain following the reinitiation of Imdur 90 mg.  He also noted an improvement myalgias and fatigue following the discontinuation of statin.    He comes in today for repeat cardiac risk stratification of hernia repair, currently scheduled for 09/23/2020.  He comes in today doing well from a cardiac perspective.  He denies any chest pain, dyspnea, palpitations, dizziness, presyncope, or syncope.  He does continue to note chronic right ankle swelling which he has attributed to gout.  Otherwise, he denies any lower extremity swelling.  He has been taking his as needed Lasix on a daily basis.  His weight is down 3 pounds today when compared to his last in person clinic visit.  He has restarted  taking Lipitor without significant myalgias or arthralgias.  Revised Cardiac Risk Index: Moderate risk for noncardiac surgery Duke Activity Status Index: > 4 METs without cardiac limitation    Labs independently reviewed: 09/2020 0 TSH 5.59, free T4 normal, HGB 14.1, PLT 344, BUN 41, SCr 1.88, potassium 4.8, albumin 4.6, AST/ALT  normal 07/2020 - TC 158, TG 181, HDL 35, LDL 92 (off statin), magnesium 1.7  Past Medical History:  Diagnosis Date   Anginal pain (HCC)    Arthritis Left knee   Chronic kidney disease    STAGE 3   Coronary artery disease    GERD (gastroesophageal reflux disease)    Gout    History of kidney problems    Hyperlipemia 08/18/2014   Hyperlipidemia    Hypertension    Hypothyroidism    TIA (transient ischemic attack)    Total knee replacement status 10/25/2016    Past Surgical History:  Procedure Laterality Date   BUNIONECTOMY Bilateral    EYE SURGERY Bilateral    Cataractt Extraction with IOL   JOINT REPLACEMENT Left    TOTAL KNEE ARTHROPLASTY Left 10/25/2016   Procedure: TOTAL KNEE ARTHROPLASTY;  Surgeon: Deeann Saint, MD;  Location: [facility] ORS;  Service: Orthopedics;  Laterality: Left;   UMBILICAL HERNIA REPAIR N/A 01/01/2020   Procedure: HERNIA REPAIR UMBILICAL ADULT;  Surgeon: Henrene Dodge, MD;  Location: [facility] ORS;  Service: General;  Laterality: N/A;   XI ROBOTIC ASSISTED INGUINAL HERNIA REPAIR WITH MESH Left 01/01/2020   Procedure: XI ROBOTIC ASSISTED INGUINAL HERNIA REPAIR WITH MESH;  Surgeon: Henrene Dodge, MD;  Location: [facility] ORS;  Service: General;  Laterality: Left;    Current Medications: Current Meds  Medication Sig   allopurinol (ZYLOPRIM) 100 MG tablet TAKE 1 TABLET(100 MG) BY MOUTH DAILY   amoxicillin-clavulanate (AUGMENTIN) 875-125 MG tablet Take 1 tablet by mouth 2 (two) times daily for 7 days.   atorvastatin (LIPITOR) 20 MG tablet Take 20 mg by mouth daily.   benzonatate (TESSALON PERLES) 100 MG capsule Take 1 capsule (100 mg total) by mouth 3 (three) times daily as needed for cough.   carvedilol (COREG) 6.25 MG tablet TAKE 1 TABLET(6.25 MG) BY MOUTH TWICE DAILY WITH A MEAL   cetirizine (ZYRTEC) 5 MG tablet Take 1 tablet (5 mg total) by mouth daily.   furosemide (LASIX) 40 MG tablet Take 1 tablet (40 mg total) by mouth daily as needed for edema (weight gain,  shortness of breath).   isosorbide mononitrate (IMDUR) 60 MG 24 hr tablet Take 1.5 tablets (90 mg total) by mouth daily.   levothyroxine (SYNTHROID) 25 MCG tablet Take 25 mcg by mouth daily before breakfast.   losartan (COZAAR) 50 MG tablet Take 1 tablet (50 mg total) by mouth daily.   nitroGLYCERIN (NITROSTAT) 0.4 MG SL tablet Place 1 tablet (0.4 mg total) under the tongue every 5 (five) minutes as needed for chest pain (chest pain). Maximum of 3 doses.   pantoprazole (PROTONIX) 40 MG tablet Take 1 tablet (40 mg total) by mouth daily.    Allergies:   Aspirin and Lisinopril   Social History   Socioeconomic History   Marital status: Married    Spouse name: Not on file   Number of children: Not on file   Years of education: Not on file   Highest education level: Not on file  Occupational History   Not on file  Tobacco Use   Smoking status: Never   Smokeless tobacco: Never  Vaping Use   Vaping Use: Never  used  Substance and Sexual Activity   Alcohol use: No   Drug use: No   Sexual activity: Not Currently  Other Topics Concern   Not on file  Social History Narrative   Not on file   Social Determinants of Health   Financial Resource Strain: Not on file  Food Insecurity: Not on file  Transportation Needs: Not on file  Physical Activity: Not on file  Stress: Not on file  Social Connections: Not on file     Family History:  The patient's family history includes Cancer in his mother. There is no history of Heart disease.  ROS:   Review of Systems  Constitutional:  Negative for chills, diaphoresis, fever and weight loss.  HENT:  Negative for congestion.   Eyes:  Negative for discharge and redness.  Respiratory:  Negative for cough, sputum production, shortness of breath and wheezing.   Cardiovascular:  Negative for chest pain, palpitations, orthopnea, claudication, leg swelling and PND.  Gastrointestinal:  Negative for abdominal pain, blood in stool, heartburn, melena,  nausea and vomiting.  Musculoskeletal:  Positive for joint pain. Negative for falls and myalgias.       Right ankle pain and swelling  Skin:  Negative for rash.  Neurological:  Negative for dizziness, tingling, tremors, sensory change, speech change, focal weakness, loss of consciousness and weakness.  Endo/Heme/Allergies:  Does not bruise/bleed easily.  Psychiatric/Behavioral:  Negative for substance abuse. The patient is not nervous/anxious.   All other systems reviewed and are negative.   EKGs/Labs/Other Studies Reviewed:    Studies reviewed were summarized above. The additional studies were reviewed today:  2D echo 12/17/2019: 1. Left ventricular ejection fraction, by estimation, is 55 to 60%. The  left ventricle has normal function. The left ventricle has no regional  wall motion abnormalities. There is mild left ventricular hypertrophy.  Left ventricular diastolic parameters  are consistent with Grade II diastolic dysfunction (pseudonormalization).   2. Right ventricular systolic function is normal. The right ventricular  size is normal.   3. The mitral valve is normal in structure. Mild mitral valve  regurgitation.   4. The aortic valve is tricuspid. Aortic valve regurgitation is mild.   5. The inferior vena cava is dilated in size with >50% respiratory  variability, suggesting right atrial pressure of 8 mmHg. __________  Eugenie Birks MPI 12/12/2019: Pharmacological myocardial perfusion imaging study with no significant  Ischemia Small to moderate-sized region of predominantly fixed defect in the inferolateral wall , challenging to interpret in the setting of GI uptake artifact Normal wall motion, EF estimated at 42% (possibly depressed secondary to artifact) No EKG changes concerning for ischemia at peak stress or in recovery. CT attenuation correction images with very mild aortic atherosclerosis, no notable coronary calcification Low risk scan   EKG:  EKG is ordered today.   The EKG ordered today demonstrates NSR, 90 bpm, occasional PVCs, no acute ST-T changes  Recent Labs: 01/27/2020: BNP 37.5 07/27/2020: Magnesium 1.7 09/10/2020: ALT 19; BUN 41; Creatinine, Ser 1.88; Hemoglobin 14.1; Platelets 344; Potassium 4.8; Sodium 133; TSH 5.590  Recent Lipid Panel    Component Value Date/Time   CHOL 158 07/27/2020 0955   TRIG 181 (H) 07/27/2020 0955   HDL 35 (L) 07/27/2020 0955   CHOLHDL 3.3 05/26/2020 1001   CHOLHDL 2.7 04/11/2019 0934   VLDL 25 04/11/2019 0934   LDLCALC 92 07/27/2020 0955   LDLDIRECT UNABLETO PERFORMED DUE TO NORMAL TRIG 04/11/2019 0934    PHYSICAL EXAM:    VS:  BP 112/88 (BP Location: Left Arm, Patient Position: Sitting, Cuff Size: Normal)   Pulse 90   Wt 188 lb (85.3 kg)   SpO2 99%   BMI 31.28 kg/m   BMI: Body mass index is 31.28 kg/m.  Physical Exam Vitals reviewed.  Constitutional:      Appearance: He is well-developed.     Comments: Hard of hearing  HENT:     Head: Normocephalic and atraumatic.  Eyes:     General:        Right eye: No discharge.        Left eye: No discharge.  Neck:     Vascular: No JVD.  Cardiovascular:     Rate and Rhythm: Normal rate and regular rhythm.     Heart sounds: Normal heart sounds, S1 normal and S2 normal. Heart sounds not distant. No midsystolic click and no opening snap. No murmur heard.   No friction rub.  Pulmonary:     Effort: Pulmonary effort is normal. No respiratory distress.     Breath sounds: Normal breath sounds. No decreased breath sounds, wheezing or rales.  Chest:     Chest wall: No tenderness.  Abdominal:     General: There is no distension.     Palpations: Abdomen is soft.     Tenderness: There is no abdominal tenderness.  Musculoskeletal:     Cervical back: Normal range of motion.     Comments: Mild STS along the right ankle  Skin:    General: Skin is warm and dry.     Nails: There is no clubbing.  Neurological:     Mental Status: He is alert and oriented to person,  place, and time.  Psychiatric:        Speech: Speech normal.        Behavior: Behavior normal.        Thought Content: Thought content normal.        Judgment: Judgment normal.    Wt Readings from Last 3 Encounters:  09/14/20 188 lb (85.3 kg)  09/14/20 186 lb 6.4 oz (84.6 kg)  09/08/20 186 lb 6.4 oz (84.6 kg)     ASSESSMENT & PLAN:   Preoperative cardiac risk stratification: He was previously evaluated for this in 11/2019, with work up at that time demonstrating a low risk stress test with no notable coronary artery calcification and only very mild aortic atherosclerosis on CT attenuated corrected images.  Echo at that time showed showed a preserved LV systolic function.  He was felt to be acceptable risk for noncardiac surgery.  His primary cardiologist noted the patient could hold Plavix prior to surgery as well.  At that time, he did not proceed with hernia repair.  He is now schedule to undergo hernia repair on 09/23/2020.  Per Revised Cardiac Risk Index, he is moderate risk for noncardiac surgery.  Per Duke Activity Status Index, he is able to achieve >4 METs without cardiac limitation.  Given he is without any symptoms concerning for angina or decompensation, and in the context of a recent Lexiscan MPI showing no evidence of ischemia he may proceed with noncardiac surgery at an overall moderate risk.  No further cardiac testing will reduce this risk any further.  Of note, the patient is on Plavix, per his MAR, for TIA.  However, he is uncertain when or if he discontinued this medication.  I have reached out to his PCP who has indicated it would be okay for the patient to discontinue Plavix 1  week prior to his planned procedure.  Resumption of Plavix per PCP/surgical team.  CAD involving the native coronary arteries without angina/dyspnea: Diagnosis based on outside prior stress test.  He comes in doing well without any symptoms concerning for angina or worsening dyspnea.  Recent Lexiscan MPI in  12/2019 showed no significant ischemia or significant coronary artery calcifications on CT attenuation corrected images with very mild aortic atherosclerosis.  He remains on carvedilol, Imdur, losartan, and atorvastatin.  HFpEF: He appears euvolemic and well compensated.  Most recent labs did demonstrate some AKI.  I have recommended he begin taking Lasix on a as needed basis as his instructions have previously indicated.  Lower extremity swelling: No appreciable lower extremity swelling on exam today outside of mild soft tissue swelling involving the right ankle.  HTN: Blood pressure is well controlled in the office today.  Continue current medical therapy.  HLD: LDL 92 in 07/2020, off Lipitor since 05/2020.  With discontinuation of Lipitor, he noted improvement in myalgias.  He has now self resumed atorvastatin with no noted myalgias, arthralgias, or significant fatigue.  Acute on CKD stage IIIb: Most recent SCr 1.88 with a baseline around 1.4-1.6.  Acute on CKD likely in the setting of standing Lasix usage.  Recommend he go back to taking Lasix 40 mg on a as needed basis as his current instructions indicate.  Follow-up BMP in 1 to 2 weeks.  TIA: Not currently taking clopidogrel.  He is uncertain if this is in the setting of his upcoming surgery or if he has just discontinued it.  I have recommended that he discuss this further with his PCP.  He has resumed atorvastatin as outlined above.  Language barrier: Language line medical interpreter utilized for today's visit.  Disposition: F/u with Dr. Okey Dupre or an APP in 3 months.   Medication Adjustments/Labs and Tests Ordered: Current medicines are reviewed at length with the patient today.  Concerns regarding medicines are outlined above. Medication changes, Labs and Tests ordered today are summarized above and listed in the Patient Instructions accessible in Encounters.   Signed, Eula Listen, PA-C 09/14/2020 3:49 PM     [facility] [address] [place], [place] [address] [phone]

## 2020-09-14 NOTE — Telephone Encounter (Signed)
Pt seeing ryan Dunn, PAC today for pre op clearance. Will fax notes to [facility] for appt today

## 2020-09-15 ENCOUNTER — Telehealth: Payer: Self-pay | Admitting: Surgery

## 2020-09-15 ENCOUNTER — Ambulatory Visit
Admission: RE | Admit: 2020-09-15 | Discharge: 2020-09-15 | Disposition: A | Discharge status: Home / Self Care | Payer: Medicaid Other | Attending: Nurse Practitioner | Admitting: Nurse Practitioner

## 2020-09-15 ENCOUNTER — Telehealth: Payer: Self-pay | Admitting: Urgent Care

## 2020-09-15 ENCOUNTER — Telehealth: Payer: Self-pay

## 2020-09-15 ENCOUNTER — Ambulatory Visit
Admission: RE | Admit: 2020-09-15 | Discharge: 2020-09-15 | Disposition: A | Discharge status: Home / Self Care | Payer: Medicaid Other | Source: Ambulatory Visit | Attending: Nurse Practitioner | Admitting: Nurse Practitioner

## 2020-09-15 DIAGNOSIS — R059 Cough, unspecified: Secondary | ICD-10-CM | POA: Diagnosis not present

## 2020-09-15 DIAGNOSIS — R053 Chronic cough: Secondary | ICD-10-CM

## 2020-09-15 DIAGNOSIS — J9811 Atelectasis: Secondary | ICD-10-CM | POA: Diagnosis not present

## 2020-09-15 NOTE — Progress Notes (Signed)
  Perioperative Services Pre-Admission/Anesthesia Testing    Date: 09/15/20  Name: Gerald Hodges MRN:   [id number]  Re: Lab results and need for change in surgical plan of care  Planned Procedure:     Clinical Information:  Patient scheduled for the above procedure on 09/23/2020 with Dr. Ernesto Rutherford.  In review of patient's chart for free surgical anesthesia clearance, it was noted that patient has been experiencing a symptoms concerning for TB (cough, night sweats, and weight loss).  Symptom constellation prompted PCP to order quantiferon-TB gold testing on this patient.  Testing resulted as positive late yesterday afternoon.  Patient has been referred to the [facility] Department for further evaluation and treatment.  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 He has been referred for further evaluation and treatment. Spoke with attending anesthesiologist Randa Ngo, MD) regarding patient's upcoming surgical procedure.  Per anesthesia, patient will need to be seen and complete a full course of treatment prior to proceeding with elective hernia repair.  Reached out to primary attending surgeon Aleen Campi, MD) to make him aware of results and criteria from anesthesia for proceeding with this patient's case. Case being cancelled at this time pending direction from [facility] regarding treatment course. Surgeon's office to follow up with patient. Additionally, patient was seen in consult by cardiology yesterday for clearance visit. I have alerted provider's office of (+) results as well due to close patient exposure.   Quentin Mulling, MSN, APRN, FNP-C, CEN [facility]  Peri-operative Services Nurse Practitioner Phone: [phone] 09/15/20 9:44 AM  NOTE: This note has been prepared using Dragon dictation software. Despite my best ability to proofread, there is always the potential that unintentional transcriptional errors may still occur from this process.

## 2020-09-15 NOTE — Telephone Encounter (Signed)
Copied from CRM [id number]. Topic: General - Other >> Sep 15, 2020  9:40 AM Gwenlyn Fudge wrote: Reason for CRM: Pts son called in regarding pts medication for tuberculosis. He states that he was advised to pick up from health dept. He states that the health dept has no record of it. He is requesting to have nurse give a call back to give him more info. Pts son is also requesting to have more info on the chest imaging that needs to be done. Please advise.

## 2020-09-15 NOTE — Telephone Encounter (Signed)
Paperwork completed and faxed to the Health Department. Patient son notified and was advised if he has any questions to give our office a call back.  

## 2020-09-15 NOTE — Telephone Encounter (Signed)
Outgoing call is made to the son, Trinna Post who is informed due to his dad's recent diagnosis of TB, surgery will need to be cancelled until treatment process for TB has been completed by the health department.  Per Dr. Aleen Campi we can reassess with follow up in a few months.  Schedule not yet available, son will call first part of October to let us know how is dad is doing and schedule a follow up.

## 2020-09-15 NOTE — Telephone Encounter (Signed)
Paperwork completed and faxed to the Health Department. Patient son notified and was advised if he has any questions to give our office a call back.

## 2020-09-16 MED ORDER — AZITHROMYCIN 250 MG PO TABS: ORAL_TABLET | ORAL | 0 refills | Status: DC

## 2020-09-16 NOTE — Addendum Note (Signed)
Addended by: Aura Dials T on: 09/16/2020 07:29 AM   Modules accepted: Orders

## 2020-09-16 NOTE — Progress Notes (Signed)
Good morning, please let Gerald Hodges's son know that his chest x-ray returned, reviewed report and review imaging, and shows he may have a small infection in left lower lung.  This can also be consistent with recent tuberculosis positive lab work.  I want to ensure they have, or will be ASAP, heading to health department for further treatment and assessment.  I did alert surgeon and see surgery is on hold at this time, until we can improve symptoms.  I agree with this, at this time.  The sooner we start treatment the better and health department will assist with this.  I have sent in one more antibiotic to cover possible pneumonia, please complete this as well.  Any questions?  Have a fantastic day!! Keep being amazing!!  Thank you for allowing me to participate in your care.  I appreciate you. Kindest regards, Milcah Dulany  Nursing Crew: Also please fax imaging and positive Quantiferon testing to health department and fill out any CDC forms we may need to complete for positive TB results.  Thank you.

## 2020-09-17 ENCOUNTER — Ambulatory Visit (LOCAL_COMMUNITY_HEALTH_CENTER): Payer: Medicaid Other

## 2020-09-17 ENCOUNTER — Ambulatory Visit: Payer: Medicaid Other | Admitting: Nurse Practitioner

## 2020-09-17 ENCOUNTER — Other Ambulatory Visit: Payer: Self-pay

## 2020-09-17 VITALS — Wt 188.0 lb

## 2020-09-17 DIAGNOSIS — Z111 Encounter for screening for respiratory tuberculosis: Secondary | ICD-10-CM

## 2020-09-18 ENCOUNTER — Other Ambulatory Visit (LOCAL_COMMUNITY_HEALTH_CENTER): Payer: Medicaid Other

## 2020-09-18 DIAGNOSIS — R7612 Nonspecific reaction to cell mediated immunity measurement of gamma interferon antigen response without active tuberculosis: Secondary | ICD-10-CM

## 2020-09-19 ENCOUNTER — Other Ambulatory Visit (LOCAL_COMMUNITY_HEALTH_CENTER): Payer: Medicaid Other

## 2020-09-19 DIAGNOSIS — R7612 Nonspecific reaction to cell mediated immunity measurement of gamma interferon antigen response without active tuberculosis: Secondary | ICD-10-CM

## 2020-09-20 ENCOUNTER — Telehealth: Payer: Self-pay

## 2020-09-20 ENCOUNTER — Ambulatory Visit: Payer: Medicaid Other | Admitting: Internal Medicine

## 2020-09-20 NOTE — Telephone Encounter (Signed)
Patient son aware of providers recommendations. At this time states he does not want an appointment.

## 2020-09-20 NOTE — Progress Notes (Signed)
Sputum x 1 Trinitie Mcgirr, RN  

## 2020-09-20 NOTE — Progress Notes (Signed)
Sputum x 1 Gerald Rekowski, RN  

## 2020-09-20 NOTE — Telephone Encounter (Signed)
Patient scheduled appointment for this afternoon at 4 due to gout flare up he's been having for 2 weeks, per Dr. Charlotta Newton would need to be virtual but patient son states no one will be there that speaks english. Patient son would like to know if it is possible to send in Colchicine as that was working better for him than the allopurinol.

## 2020-09-21 ENCOUNTER — Telehealth: Payer: Self-pay

## 2020-09-21 NOTE — Telephone Encounter (Signed)
TC with son, Lyn Hollingshead.  Informed 1 out 3 sputum samples were 2+ AFB.  State lab will do MTB PCR in the morning and should have results within a few hours.  TB RN will contact family and PCP once PCR results are complete Richmond Campbell, RN

## 2020-09-21 NOTE — Progress Notes (Signed)
Patient referred from [facility] for +QFT and abnormal CXR.  Dr. Alvester Morin has reviewed CXR report and would like sputums x 3 completed.  Discussed LTBI vs Active TB and briefly discussed treatment regimens.  Explained the sputum process and if +AFB state lab will do PCR testing.   Patient complains mostly about nagging cough for several weeks and with upper back pain. Richmond Campbell, RN

## 2020-09-22 ENCOUNTER — Telehealth: Payer: Self-pay

## 2020-09-22 NOTE — Telephone Encounter (Signed)
TC with son. Informed PCR negative for MTB.  Will await sputum cultures. Richmond Campbell, RN

## 2020-09-23 ENCOUNTER — Ambulatory Visit: Admit: 2020-09-23 | Payer: Medicaid Other | Admitting: Surgery

## 2020-09-23 SURGERY — REPAIR, HERNIA, UMBILICAL, ROBOT-ASSISTED
Anesthesia: General

## 2020-09-24 ENCOUNTER — Ambulatory Visit: Payer: Medicaid Other | Admitting: Nurse Practitioner

## 2020-09-26 NOTE — Progress Notes (Signed)
BP (!) 147/87   Pulse 73   Temp 98.7 F (37.1 C)   SpO2 98%    Subjective:    Patient ID: Gerald Hodges, male    DOB: Aug 06, 1935, 85 y.o.   MRN: [id number]  HPI: Gerald Hodges is a 85 y.o. male  Chief Complaint  Patient presents with   Edema    Bilateral feet, stayed swollen for two weeks solid, causing pain.    Cough    Unsure if one of the medications may be causing it    GOUT Duration: 2 weeks Right 1st metatarsophalangeal pain: yes Left 1st metatarsophalangeal pain: yes Right knee pain: no Left knee pain: no Severity: moderate  Quality:  pinching Swelling: yes Redness: yes Trauma: no Recent dietary change or indiscretion: no Fevers: no Nausea/vomiting: yes Aggravating factors: walking Alleviating factors: laying in the bed Status:  worse Treatments attempted: None  Relevant past medical, surgical, family and social history reviewed and updated as indicated. Interim medical history since our last visit reviewed. Allergies and medications reviewed and updated.  Review of Systems  Musculoskeletal:        Great toe pain, swelling and redness.   Per HPI unless specifically indicated above     Objective:    BP (!) 147/87   Pulse 73   Temp 98.7 F (37.1 C)   SpO2 98%   Wt Readings from Last 3 Encounters:  09/18/20 188 lb (85.3 kg)  09/14/20 186 lb 6.4 oz (84.6 kg)  09/14/20 188 lb (85.3 kg)    Physical Exam Vitals and nursing note reviewed.  Constitutional:      General: He is not in acute distress.    Appearance: Normal appearance. He is not ill-appearing, toxic-appearing or diaphoretic.  HENT:     Head: Normocephalic.     Right Ear: External ear normal.     Left Ear: External ear normal.     Nose: Nose normal. No congestion or rhinorrhea.     Mouth/Throat:     Mouth: Mucous membranes are moist.  Eyes:     General:        Right eye: No discharge.        Left eye: No discharge.     Extraocular Movements: Extraocular  movements intact.     Conjunctiva/sclera: Conjunctivae normal.     Pupils: Pupils are equal, round, and reactive to light.  Cardiovascular:     Rate and Rhythm: Normal rate and regular rhythm.     Heart sounds: No murmur heard. Pulmonary:     Effort: Pulmonary effort is normal. No respiratory distress.     Breath sounds: Normal breath sounds. No wheezing, rhonchi or rales.  Abdominal:     General: Abdomen is flat. Bowel sounds are normal.  Musculoskeletal:        General: Swelling present.     Cervical back: Normal range of motion and neck supple.       Legs:  Skin:    General: Skin is warm and dry.     Capillary Refill: Capillary refill takes less than 2 seconds.  Neurological:     General: No focal deficit present.     Mental Status: He is alert and oriented to person, place, and time.  Psychiatric:        Mood and Affect: Mood normal.        Behavior: Behavior normal.        Thought Content: Thought content normal.  Judgment: Judgment normal.    Results for orders placed or performed in visit on 09/10/20  QuantiFERON-TB Gold Plus  Result Value Ref Range   QuantiFERON Incubation Incubation performed.    QuantiFERON Criteria Comment    QuantiFERON TB1 Ag Value 0.71 IU/mL   QuantiFERON TB2 Ag Value 0.68 IU/mL   QuantiFERON Nil Value 0.06 IU/mL   QuantiFERON Mitogen Value >10.00 IU/mL   QuantiFERON-TB Gold Plus Positive (A) Negative  Comprehensive metabolic panel  Result Value Ref Range   Glucose 121 (H) 65 - 99 mg/dL   BUN 41 (H) 8 - 27 mg/dL   Creatinine, Ser 1.88 (H) 0.76 - 1.27 mg/dL   eGFR 35 (L) >59 mL/min/1.73   BUN/Creatinine Ratio 22 10 - 24   Sodium 133 (L) 134 - 144 mmol/L   Potassium 4.8 3.5 - 5.2 mmol/L   Chloride 95 (L) 96 - 106 mmol/L   CO2 21 20 - 29 mmol/L   Calcium 9.9 8.6 - 10.2 mg/dL   Total Protein 7.4 6.0 - 8.5 g/dL   Albumin 4.6 3.6 - 4.6 g/dL   Globulin, Total 2.8 1.5 - 4.5 g/dL   Albumin/Globulin Ratio 1.6 1.2 - 2.2   Bilirubin  Total 0.7 0.0 - 1.2 mg/dL   Alkaline Phosphatase 151 (H) 44 - 121 IU/L   AST 24 0 - 40 IU/L   ALT 19 0 - 44 IU/L  CBC with Differential/Platelet  Result Value Ref Range   WBC 5.7 3.4 - 10.8 x10E3/uL   RBC 4.56 4.14 - 5.80 x10E6/uL   Hemoglobin 14.1 13.0 - 17.7 g/dL   Hematocrit 41.1 37.5 - 51.0 %   MCV 90 79 - 97 fL   MCH 30.9 26.6 - 33.0 pg   MCHC 34.3 31.5 - 35.7 g/dL   RDW 13.1 11.6 - 15.4 %   Platelets 344 150 - 450 x10E3/uL   Neutrophils 48 Not Estab. %   Lymphs 36 Not Estab. %   Monocytes 9 Not Estab. %   Eos 6 Not Estab. %   Basos 1 Not Estab. %   Neutrophils Absolute 2.8 1.4 - 7.0 x10E3/uL   Lymphocytes Absolute 2.0 0.7 - 3.1 x10E3/uL   Monocytes Absolute 0.5 0.1 - 0.9 x10E3/uL   EOS (ABSOLUTE) 0.3 0.0 - 0.4 x10E3/uL   Basophils Absolute 0.1 0.0 - 0.2 x10E3/uL   Immature Granulocytes 0 Not Estab. %   Immature Grans (Abs) 0.0 0.0 - 0.1 x10E3/uL  TSH  Result Value Ref Range   TSH 5.590 (H) 0.450 - 4.500 uIU/mL  T4, free  Result Value Ref Range   Free T4 1.12 0.82 - 1.77 ng/dL  SAR CoV2 Serology (COVID 19)AB(IGG)IA  Result Value Ref Range   SARS-CoV-2 Semi-Quant IgG Ab >800.0 Neg <13.0 AU/mL   SARS-CoV-2 Spike Ab Interp Positive       Assessment & Plan:   Problem List Items Addressed This Visit       Other   Gout - Primary    Prednisone taper sent to the pharmacy. Prednisone sent due to kidney function and not being able to take NSAIDS or cholchecine. Follow up if symptoms worsen.         Follow up plan: Return if symptoms worsen or fail to improve.       

## 2020-09-27 ENCOUNTER — Encounter: Payer: Self-pay | Admitting: Nurse Practitioner

## 2020-09-27 ENCOUNTER — Ambulatory Visit (INDEPENDENT_AMBULATORY_CARE_PROVIDER_SITE_OTHER): Payer: Medicaid Other | Admitting: Nurse Practitioner

## 2020-09-27 ENCOUNTER — Other Ambulatory Visit: Payer: Self-pay

## 2020-09-27 VITALS — BP 147/87 | HR 73 | Temp 98.7°F

## 2020-09-27 DIAGNOSIS — M109 Gout, unspecified: Secondary | ICD-10-CM

## 2020-09-27 MED ORDER — PREDNISONE 10 MG PO TABS: 10.0000 mg | ORAL_TABLET | Freq: Every day | ORAL | 0 refills | Status: DC

## 2020-09-27 NOTE — Assessment & Plan Note (Signed)
Prednisone taper sent to the pharmacy. Prednisone sent due to kidney function and not being able to take NSAIDS or cholchecine. Follow up if symptoms worsen.

## 2020-10-04 NOTE — Telephone Encounter (Signed)
CXR reveiwed by Dr. Alvester Morin; signed doc sent for scanning Richmond Campbell, RN

## 2020-10-18 ENCOUNTER — Other Ambulatory Visit: Payer: Self-pay | Admitting: Nurse Practitioner

## 2020-10-18 NOTE — Telephone Encounter (Signed)
Requested medication (s) are due for refill today: Amount not specified  Requested medication (s) are on the active medication list: yes  Last refill: 09/14/20 Amount not specified  Future visit scheduled Yes   labs 10/19/20   With provider 01/27/21  Notes to clinic: Historical provider  Requested Prescriptions  Pending Prescriptions Disp Refills   levothyroxine (SYNTHROID) 25 MCG tablet [Pharmacy Med Name: LEVOTHYROXINE 0.025MG  ( ) TAB] 30 tablet     Sig: TAKE 1 TABLET(25 MCG) BY MOUTH DAILY BEFORE AND BREAKFAST      Endocrinology:  Hypothyroid Agents Failed - 10/18/2020  6:02 PM      Failed - TSH needs to be rechecked within 3 months after an abnormal result. Refill until TSH is due.      Failed - TSH in normal range and within 360 days    TSH  Date Value Ref Range Status  09/10/2020 5.590 (H) 0.450 - 4.500 uIU/mL Final          Passed - Valid encounter within last 12 months    Recent Outpatient Visits           3 weeks ago Acute gout of multiple sites, unspecified cause   [facility] Larae Grooms, NP   1 month ago Chronic cough   [facility] Ravenel, [place] T, NP   2 months ago Chronic heart failure with preserved ejection fraction [facility])   [facility] Miller, Corrie Dandy T, NP   8 months ago Leg swelling   [facility] Valentino Nose, NP   10 months ago Stage 3b chronic kidney disease   [facility] Valentino Nose, NP       Future Appointments             In 3 months Cannady, Dorie Rank, NP [employer]

## 2020-10-19 ENCOUNTER — Other Ambulatory Visit: Payer: Self-pay | Admitting: Nurse Practitioner

## 2020-10-19 ENCOUNTER — Other Ambulatory Visit: Payer: Self-pay

## 2020-11-08 ENCOUNTER — Telehealth: Payer: Self-pay

## 2020-11-08 DIAGNOSIS — Z227 Latent tuberculosis: Secondary | ICD-10-CM

## 2020-11-08 NOTE — Telephone Encounter (Signed)
TC to patient and son, Lyn Hollingshead. Informed of negative sputum cultures.    TC to [facility] Informed Britta Mccreedy that patient's cultures are negative for MTB.  Patient is ok to proceed with surgery.    Epic message sent to patient's PCP, Dr. Harvest Dark. Informed cultures are negative.    Reports from state lab sent for scanning.Richmond Campbell, RN

## 2020-11-26 ENCOUNTER — Encounter: Payer: Self-pay | Admitting: Surgery

## 2020-11-26 ENCOUNTER — Other Ambulatory Visit: Payer: Self-pay

## 2020-11-26 ENCOUNTER — Ambulatory Visit: Payer: Medicaid Other | Admitting: Surgery

## 2020-11-26 VITALS — BP 149/92 | HR 84 | Temp 98.6°F | Wt 185.0 lb

## 2020-11-26 DIAGNOSIS — K429 Umbilical hernia without obstruction or gangrene: Secondary | ICD-10-CM

## 2020-11-26 NOTE — H&P (View-Only) (Signed)
11/26/2020  History of Present Illness: Gerald Hodges is a 85 y.o. male presenting for follow-up of a recurrent umbilical hernia.  He was last seen on 09/08/2020 for his recurrence and he was initially scheduled for robotic assisted umbilical hernia repair on 09/23/2020.  We had obtain cardiology clearance to stop his Plavix 7 days before surgery.  However on his office appointment, he was also having a chronic cough and no further testing he was thought to be positive for tuberculosis.  Surgery was canceled and further work-up for his tuberculosis showed that overall he had a negative culture.  He has been cleared by the health department from the tuberculosis standpoint and now presents to discuss surgery again.  Today, he reports that he still having umbilical discomfort he feels that the hernia may have grown in size.  Denies any worsening pain, nausea, vomiting.  Denies any chest pain or shortness of breath.  He is ready to have the surgery.  Past Medical History: Past Medical History:  Diagnosis Date   Anginal pain (HCC)    Arthritis Left knee   Chronic kidney disease    STAGE 3   Coronary artery disease    GERD (gastroesophageal reflux disease)    Gout    History of kidney problems    Hyperlipemia 08/18/2014   Hyperlipidemia    Hypertension    Hypothyroidism    TIA (transient ischemic attack)    Total knee replacement status 10/25/2016     Past Surgical History: Past Surgical History:  Procedure Laterality Date   BUNIONECTOMY Bilateral    EYE SURGERY Bilateral    Cataractt Extraction with IOL   JOINT REPLACEMENT Left    TOTAL KNEE ARTHROPLASTY Left 10/25/2016   Procedure: TOTAL KNEE ARTHROPLASTY;  Surgeon: Deeann Saint, MD;  Location: [facility] ORS;  Service: Orthopedics;  Laterality: Left;   UMBILICAL HERNIA REPAIR N/A 01/01/2020   Procedure: HERNIA REPAIR UMBILICAL ADULT;  Surgeon: Henrene Dodge, MD;  Location: [facility] ORS;  Service: General;  Laterality: N/A;   XI  ROBOTIC ASSISTED INGUINAL HERNIA REPAIR WITH MESH Left 01/01/2020   Procedure: XI ROBOTIC ASSISTED INGUINAL HERNIA REPAIR WITH MESH;  Surgeon: Henrene Dodge, MD;  Location: [facility] ORS;  Service: General;  Laterality: Left;    Home Medications: Prior to Admission medications   Medication Sig Start Date End Date Taking? Authorizing Provider  albuterol (VENTOLIN HFA) 108 (90 Base) MCG/ACT inhaler Inhale 2 puffs into the lungs every 6 (six) hours as needed for wheezing or shortness of breath. 09/10/20  Yes Cannady, Jolene T, NP  allopurinol (ZYLOPRIM) 100 MG tablet TAKE 1 TABLET(100 MG) BY MOUTH DAILY 07/27/20  Yes Cannady, Jolene T, NP  amLODipine (NORVASC) 5 MG tablet Take 1 tablet (5 mg total) by mouth daily. 07/27/20  Yes Cannady, Jolene T, NP  atorvastatin (LIPITOR) 20 MG tablet Take 20 mg by mouth daily.   Yes [provider]  benzonatate (TESSALON PERLES) 100 MG capsule Take 1 capsule (100 mg total) by mouth 3 (three) times daily as needed for cough. 09/10/20  Yes Cannady, Jolene T, NP  carvedilol (COREG) 6.25 MG tablet TAKE 1 TABLET(6.25 MG) BY MOUTH TWICE DAILY WITH A MEAL 05/24/20  Yes End, Cristal Deer, MD  cetirizine (ZYRTEC) 5 MG tablet Take 1 tablet (5 mg total) by mouth daily. 07/27/20  Yes Cannady, Jolene T, NP  clobetasol ointment (TEMOVATE) 0.05 % Apply topically. 09/24/20  Yes [provider]  clopidogrel (PLAVIX) 75 MG tablet Take 1 tablet (75 mg total) by  mouth daily. 07/27/20  Yes Cannady, Jolene T, NP  furosemide (LASIX) 40 MG tablet Take 1 tablet (40 mg total) by mouth daily as needed for edema (weight gain, shortness of breath). 07/27/20  Yes Cannady, Jolene T, NP  levothyroxine (SYNTHROID) 50 MCG tablet Take 1 tablet (50 mcg total) by mouth daily. 09/12/20  Yes Cannady, Jolene T, NP  losartan (COZAAR) 50 MG tablet Take 1 tablet (50 mg total) by mouth daily. 07/27/20  Yes Cannady, Jolene T, NP  nitroGLYCERIN (NITROSTAT) 0.4 MG SL tablet Place 1 tablet (0.4 mg total) under the  tongue every 5 (five) minutes as needed for chest pain (chest pain). Maximum of 3 doses. 04/11/19  Yes Dunn, Ryan M, PA-C  pantoprazole (PROTONIX) 40 MG tablet Take 1 tablet (40 mg total) by mouth daily. 07/27/20  Yes Cannady, Jolene T, NP  predniSONE (DELTASONE) 10 MG tablet Take 1 tablet (10 mg total) by mouth daily with breakfast. 09/27/20  Yes Holdsworth, Karen, NP  rosuvastatin (CRESTOR) 10 MG tablet Take 10 MG (one tablet) by mouth 3 days a week (Monday, Wednesday, Friday). 07/28/20  Yes Cannady, Jolene T, NP  isosorbide mononitrate (IMDUR) 60 MG 24 hr tablet Take 1.5 tablets (90 mg total) by mouth daily. 05/26/20 09/14/20  End, Christopher, MD    Allergies: Allergies  Allergen Reactions   Aspirin Cough and Other (See Comments)   Lisinopril Itching    Throat itching     Review of Systems: Review of Systems  Constitutional:  Negative for chills and fever.  HENT:  Negative for hearing loss.   Respiratory:  Negative for shortness of breath.   Cardiovascular:  Negative for chest pain.  Gastrointestinal:  Positive for abdominal pain. Negative for constipation, diarrhea, nausea and vomiting.  Genitourinary:  Negative for dysuria.  Musculoskeletal:  Negative for myalgias.  Skin:  Negative for rash.  Neurological:  Negative for dizziness.  Psychiatric/Behavioral:  Negative for depression.    Physical Exam BP (!) 149/92   Pulse 84   Temp 98.6 F (37 C)   Wt 185 lb (83.9 kg)   SpO2 95%   BMI 30.79 kg/m  CONSTITUTIONAL: No acute distress, well-nourished HEENT:  Normocephalic, atraumatic, extraocular motion intact. RESPIRATORY:  Lungs are clear, and breath sounds are equal bilaterally. Normal respiratory effort without pathologic use of accessory muscles. CARDIOVASCULAR: Heart is regular without murmurs, gallops, or rubs. GI: The abdomen is soft, nondistended, with mild discomfort to palpation at the umbilicus.  The patient has a 3.5 to 4 cm reducible umbilical hernia associated with  diastases recti.  No evidence of inguinal hernia recurrence on the left side.   MUSCULOSKELETAL: No peripheral edema, normal gait. SKIN: No pathologic skin lesions noted NEUROLOGIC:  Motor and sensation is grossly normal.  Cranial nerves are grossly intact. PSYCH:  Alert and oriented to person, place and time. Affect is normal.  Labs/Imaging: None new  Assessment and Plan: This is a 84 y.o. male with recurrent umbilical hernia, now cleared for surgery.  - Discussed with patient again we have the plan for robotic assisted umbilical hernia repair with mesh.  Discussed with him the surgery at length including the risks of bleeding, infection, injury to surrounding structures, mesh infection, postop recovery, activity restrictions, pain control, and he is willing to proceed.  He specifically asked for an abdominal binder after surgery and we can most certainly place a binder after his surgery is done. - He currently is taking Plavix for history of TIA and coronary artery disease.    He has been cleared in the past to stop his Plavix 7 days prior to surgery. -He will be scheduled for surgery on 12/14/2020.  His last dose of Plavix will be on 12/06/2020.  Face-to-face time spent with the patient and care providers was 40 minutes, with more than 50% of the time spent counseling, educating, and coordinating care of the patient.     Nettie Wyffels Luis Cable Fearn, MD [facility]     

## 2020-11-26 NOTE — Progress Notes (Signed)
11/26/2020  History of Present Illness: Gerald Hodges is a 85 y.o. male presenting for follow-up of a recurrent umbilical hernia.  He was last seen on 09/08/2020 for his recurrence and he was initially scheduled for robotic assisted umbilical hernia repair on 09/23/2020.  We had obtain cardiology clearance to stop his Plavix 7 days before surgery.  However on his office appointment, he was also having a chronic cough and no further testing he was thought to be positive for tuberculosis.  Surgery was canceled and further work-up for his tuberculosis showed that overall he had a negative culture.  He has been cleared by the health department from the tuberculosis standpoint and now presents to discuss surgery again.  Today, he reports that he still having umbilical discomfort he feels that the hernia may have grown in size.  Denies any worsening pain, nausea, vomiting.  Denies any chest pain or shortness of breath.  He is ready to have the surgery.  Past Medical History: Past Medical History:  Diagnosis Date   Anginal pain (HCC)    Arthritis Left knee   Chronic kidney disease    STAGE 3   Coronary artery disease    GERD (gastroesophageal reflux disease)    Gout    History of kidney problems    Hyperlipemia 08/18/2014   Hyperlipidemia    Hypertension    Hypothyroidism    TIA (transient ischemic attack)    Total knee replacement status 10/25/2016     Past Surgical History: Past Surgical History:  Procedure Laterality Date   BUNIONECTOMY Bilateral    EYE SURGERY Bilateral    Cataractt Extraction with IOL   JOINT REPLACEMENT Left    TOTAL KNEE ARTHROPLASTY Left 10/25/2016   Procedure: TOTAL KNEE ARTHROPLASTY;  Surgeon: Deeann Saint, MD;  Location: [facility] ORS;  Service: Orthopedics;  Laterality: Left;   UMBILICAL HERNIA REPAIR N/A 01/01/2020   Procedure: HERNIA REPAIR UMBILICAL ADULT;  Surgeon: Henrene Dodge, MD;  Location: [facility] ORS;  Service: General;  Laterality: N/A;   XI  ROBOTIC ASSISTED INGUINAL HERNIA REPAIR WITH MESH Left 01/01/2020   Procedure: XI ROBOTIC ASSISTED INGUINAL HERNIA REPAIR WITH MESH;  Surgeon: Henrene Dodge, MD;  Location: [facility] ORS;  Service: General;  Laterality: Left;    Home Medications: Prior to Admission medications   Medication Sig Start Date End Date Taking? Authorizing Provider  albuterol (VENTOLIN HFA) 108 (90 Base) MCG/ACT inhaler Inhale 2 puffs into the lungs every 6 (six) hours as needed for wheezing or shortness of breath. 09/10/20  Yes Cannady, Jolene T, NP  allopurinol (ZYLOPRIM) 100 MG tablet TAKE 1 TABLET(100 MG) BY MOUTH DAILY 07/27/20  Yes Cannady, Jolene T, NP  amLODipine (NORVASC) 5 MG tablet Take 1 tablet (5 mg total) by mouth daily. 07/27/20  Yes Cannady, Jolene T, NP  atorvastatin (LIPITOR) 20 MG tablet Take 20 mg by mouth daily.   Yes [provider]  benzonatate (TESSALON PERLES) 100 MG capsule Take 1 capsule (100 mg total) by mouth 3 (three) times daily as needed for cough. 09/10/20  Yes Cannady, Jolene T, NP  carvedilol (COREG) 6.25 MG tablet TAKE 1 TABLET(6.25 MG) BY MOUTH TWICE DAILY WITH A MEAL 05/24/20  Yes End, Cristal Deer, MD  cetirizine (ZYRTEC) 5 MG tablet Take 1 tablet (5 mg total) by mouth daily. 07/27/20  Yes Cannady, Jolene T, NP  clobetasol ointment (TEMOVATE) 0.05 % Apply topically. 09/24/20  Yes [provider]  clopidogrel (PLAVIX) 75 MG tablet Take 1 tablet (75 mg total) by  mouth daily. 07/27/20  Yes Cannady, Jolene T, NP  furosemide (LASIX) 40 MG tablet Take 1 tablet (40 mg total) by mouth daily as needed for edema (weight gain, shortness of breath). 07/27/20  Yes Cannady, Corrie Dandy T, NP  levothyroxine (SYNTHROID) 50 MCG tablet Take 1 tablet (50 mcg total) by mouth daily. 09/12/20  Yes Cannady, Jolene T, NP  losartan (COZAAR) 50 MG tablet Take 1 tablet (50 mg total) by mouth daily. 07/27/20  Yes Cannady, Jolene T, NP  nitroGLYCERIN (NITROSTAT) 0.4 MG SL tablet Place 1 tablet (0.4 mg total) under the  tongue every 5 (five) minutes as needed for chest pain (chest pain). Maximum of 3 doses. 04/11/19  Yes Dunn, Raymon Mutton, PA-C  pantoprazole (PROTONIX) 40 MG tablet Take 1 tablet (40 mg total) by mouth daily. 07/27/20  Yes Cannady, Corrie Dandy T, NP  predniSONE (DELTASONE) 10 MG tablet Take 1 tablet (10 mg total) by mouth daily with breakfast. 09/27/20  Yes Larae Grooms, NP  rosuvastatin (CRESTOR) 10 MG tablet Take 10 MG (one tablet) by mouth 3 days a week (Monday, Wednesday, Friday). 07/28/20  Yes Cannady, Jolene T, NP  isosorbide mononitrate (IMDUR) 60 MG 24 hr tablet Take 1.5 tablets (90 mg total) by mouth daily. 05/26/20 09/14/20  Yvonne Kendall, MD    Allergies: Allergies  Allergen Reactions   Aspirin Cough and Other (See Comments)   Lisinopril Itching    Throat itching     Review of Systems: Review of Systems  Constitutional:  Negative for chills and fever.  HENT:  Negative for hearing loss.   Respiratory:  Negative for shortness of breath.   Cardiovascular:  Negative for chest pain.  Gastrointestinal:  Positive for abdominal pain. Negative for constipation, diarrhea, nausea and vomiting.  Genitourinary:  Negative for dysuria.  Musculoskeletal:  Negative for myalgias.  Skin:  Negative for rash.  Neurological:  Negative for dizziness.  Psychiatric/Behavioral:  Negative for depression.    Physical Exam BP (!) 149/92   Pulse 84   Temp 98.6 F (37 C)   Wt 185 lb (83.9 kg)   SpO2 95%   BMI 30.79 kg/m  CONSTITUTIONAL: No acute distress, well-nourished HEENT:  Normocephalic, atraumatic, extraocular motion intact. RESPIRATORY:  Lungs are clear, and breath sounds are equal bilaterally. Normal respiratory effort without pathologic use of accessory muscles. CARDIOVASCULAR: Heart is regular without murmurs, gallops, or rubs. GI: The abdomen is soft, nondistended, with mild discomfort to palpation at the umbilicus.  The patient has a 3.5 to 4 cm reducible umbilical hernia associated with  diastases recti.  No evidence of inguinal hernia recurrence on the left side.   MUSCULOSKELETAL: No peripheral edema, normal gait. SKIN: No pathologic skin lesions noted NEUROLOGIC:  Motor and sensation is grossly normal.  Cranial nerves are grossly intact. PSYCH:  Alert and oriented to person, place and time. Affect is normal.  Labs/Imaging: None new  Assessment and Plan: This is a 85 y.o. male with recurrent umbilical hernia, now cleared for surgery.  - Discussed with patient again we have the plan for robotic assisted umbilical hernia repair with mesh.  Discussed with him the surgery at length including the risks of bleeding, infection, injury to surrounding structures, mesh infection, postop recovery, activity restrictions, pain control, and he is willing to proceed.  He specifically asked for an abdominal binder after surgery and we can most certainly place a binder after his surgery is done. - He currently is taking Plavix for history of TIA and coronary artery disease.  He has been cleared in the past to stop his Plavix 7 days prior to surgery. -He will be scheduled for surgery on 12/14/2020.  His last dose of Plavix will be on 12/06/2020.  Face-to-face time spent with the patient and care providers was 40 minutes, with more than 50% of the time spent counseling, educating, and coordinating care of the patient.     Gerald Ill, MD [facility]

## 2020-11-26 NOTE — Patient Instructions (Addendum)
deje de tomar su Plavix 7 das antes de su [place]. tu ltimo da de Plavix ser el 26 de septiembre. No tome ms despus de esa fecha.  despus de la ciruga no podr levantar ms de 15-20 libras durante 6 semanas.  nuestro programador de [place] lo llamar para revisar las instrucciones.     Hernia umbilical en los adultos Umbilical Hernia, Adult Una hernia es un bulto de tejido que sobresale a travs de una abertura [employer]. Una hernia umbilical se produce en el abdomen, cerca del ombligo. La hernia puede contener tejidos del intestino delgado, el intestino grueso o tejido graso que recubre el intestino (epipln). Las hernias USAA en los adultos suelen empeorar con el tiempo y requieren tratamiento quirrgico. Hay varios tipos de hernias umbilicales. Es posible que tenga lo siguiente: Una hernia ubicada justo por debajo o por arriba del ombligo (hernia indirecta). Es el tipo de hernia umbilical ms frecuente en los adultos. Una hernia que se forma a travs de una abertura hecha por el ombligo (hernia directa). Una hernia que aparece y desaparece (hernia reducible). Una hernia reducible puede ser visible solo al hacer fuerza, levantar objetos pesados o toser. Este tipo de hernia se puede reintroducir en el abdomen (reducir). Una hernia que aprisiona tejido abdominal (hernia encarcelada). Este tipo de hernia es irreducible. Una hernia que interrumpe el flujo de sangre a los tejidos en su interior (hernia estrangulada). Si esto ocurre, los tejidos Mining engineer a Furniture conservator/restorer. Este tipo de hernia requiere tratamiento de Associate Professor. Cules son las causas? Una hernia umbilical se produce cuando el tejido dentro del abdomen ejerce presin sobre una zona debilitada de los msculos abdominales. Qu incrementa el riesgo? Puede correr un mayor riesgo de Warehouse manager esta afeccin en los siguientes casos: Tiene obesidad. Tuvo varios embarazos. Tiene una acumulacin de lquido dentro del  abdomen (ascitis). Se someti a una ciruga que [employer] abdominales. Cules son los signos o sntomas? El principal sntoma de esta afeccin es un bulto en el ombligo o cerca de este que no causa dolor. Una hernia reducible puede ser visible solo al hacer fuerza, levantar objetos pesados o toser. Otros sntomas pueden incluir lo siguiente: Dolor sordo. Sensacin de opresin. Los sntomas de una hernia estrangulada pueden incluir los siguientes: Dolor que se vuelve cada vez ms intenso. Nuseas y vmitos. Dolor al ejercer presin sobre la hernia. Cambio de color de la piel que recubre la hernia que se torna roja o violcea. Estreimiento. Sangre en las heces. Cmo se diagnostica? Esta afeccin se puede diagnosticar en funcin de lo siguiente: Un examen fsico. Pueden pedirle que tosa o que haga fuerza mientras est de pie. Estas acciones aumentan la presin dentro del abdomen y empujan la hernia a travs de la abertura en los msculos. El mdico puede ejercer presin sobre la hernia para tratar de reducirla. Los sntomas y los antecedentes mdicos. Cmo se trata? La ciruga es el nico tratamiento para una hernia umbilical. En el caso de que la hernia est estrangulada, esta se realiza lo antes posible. Si tiene una hernia pequea que no est encarcelada, tal vez tenga que bajar de peso antes de la [place]. Siga estas indicaciones en su casa: Baje de peso, si se lo indic el mdico. No trate de reintroducir la hernia. Observe si la hernia cambia de color o de tamao. Infrmele al mdico si se producen cambios. Tal vez deba evitar las actividades que aumentan la presin sobre la hernia. No levante objetos que pesen ms  de 10 libras (4.5 kg) hasta que el [employer] diga que es seguro. Tome los medicamentos de venta libre y los recetados solamente como se lo haya indicado el mdico. Oceanographer a todas las visitas de control como se lo haya indicado el mdico. Esto es  importante. Comunquese con un mdico si: La hernia se agranda. La hernia le causa dolor. Solicite ayuda de inmediato si: Tiene un dolor intenso y repentino cerca de la zona de la hernia. Tiene dolor, as como nuseas o vmitos. Tiene dolor y la piel que recubre la hernia cambia de color. Presenta fiebre. Esta informacin no tiene Theme park manager el consejo del mdico. Asegrese de hacerle al mdico cualquier pregunta que tenga. Document Revised: 06/11/2017 Document Reviewed: 12/04/2016 Elsevier Patient Education  2021 [employer].

## 2020-11-29 ENCOUNTER — Telehealth: Payer: Self-pay | Admitting: Surgery

## 2020-11-29 NOTE — Telephone Encounter (Signed)
Spoke with son, Lyn Hollingshead. They are aware of  Pre-Admission date/time, COVID Testing date and Surgery date.  Surgery Date: 12/14/20 Preadmission Testing Date: 12/08/20 (phone 8a-1p) Covid Testing Date: Not needed.     Patient has been made aware to call [phone], between 1-3:00pm the day before surgery, to find out what time to arrive for surgery.

## 2020-12-08 ENCOUNTER — Inpatient Hospital Stay
Admission: RE | Admit: 2020-12-08 | Discharge: 2020-12-08 | Disposition: A | Discharge status: Home / Self Care | Payer: Medicaid Other | Source: Ambulatory Visit | Attending: Surgery | Admitting: Surgery

## 2020-12-08 NOTE — Patient Instructions (Addendum)
Your procedure is scheduled on: 12/14/2020 Report to the Registration Desk on the 1st floor of the Medical [facility]. To find out your arrival time, please call [phone] between 1PM - 3PM on: 12/13/2020  Su trmite est programado para: 06/20/2020 Presntese en el mostrador de registro en el primer piso del [facility]. Para saber su hora de llegada, llame al [phone] entre las 13:00 y las 15:00 el: 05/20/2020 ----------------------------------------------------------------------------------------------------------------------------------------------------------------------------------------------------------------------- REMEMBER: Instructions that are not followed completely may result in serious medical risk, up to and including death; or upon the discretion of your surgeon and anesthesiologist your surgery may need to be rescheduled.  RECUERDA: Las instrucciones que no se siguen por Airline pilot en un riesgo mdico grave, que puede incluir la [place]; o segn el criterio de su cirujano y Scientific laboratory technician, es posible que sea Aeronautical engineer su Leisure centre manager. ----------------------------------------------------------------------------------------------------------------------------------------------------------------------------------------------------------------------- Do not eat food after midnight the night before surgery.  No gum chewing, lozengers or hard candies.  You may however, drink CLEAR liquids up to 2 hours before you are scheduled to arrive for your surgery. Do not drink anything within 2 hours of your scheduled arrival time.  Clear liquids include: - water  - apple juice without pulp - gatorade  - black coffee or tea (Do NOT add milk or creamers to the coffee or tea) Do NOT drink anything that is not on this list.  Puede beber lquidos claros hasta 2 horas antes de la fecha programada para su ciruga. No beba nada dentro de las 2 horas de su hora de llegada  programada.  Los lquidos claros incluyen: - agua - jugo de manzana sin pulpa - gatorade - caf o t solo (NO agregue leche o cremas al caf o t) NO beba nada que no est en esta lista ----------------------------------------------------------------------------------------------------------------------------------------------------------------------------------------------------------------------- TAKE THESE MEDICATIONS THE MORNING OF SURGERY WITH A SIP OF WATER: TOME ESTOS MEDICAMENTOS LA MAANA DE LA CIRUGA CON UN SORBO DE AGUA: - Allopurinol (ZYLOPRIM) 100 MG - AmLODipine (NORVASC) 5 MG - carvedilol (COREG) 6.25 MG - isosorbide mononitrate (IMDUR) 60 MG - levothyroxine (SYNTHROID) 50 MCG - Pantoprazole (PROTONIX) 40 MG - take one the night before and one on the morning of surgery  Use inhalers on the day of surgery and bring to the hospital.  Use inhaladores el da de la ciruga y llvelos al hospital.  Dejar de tomar plavix el 26/11/2020 ------------------------------------------------------------------------------------------------------------------------------------------------------------------------------------------------------------------ One week prior to surgery: Stop Anti-inflammatories (NSAIDS) such as Advil, Aleve, Ibuprofen, Motrin, Naproxen, Naprosyn and Aspirin based products such as Excedrin, Goodys Powder, BC Powder. You may however, continue to take Tylenol if needed for pain up until the day of surgery. Stop ANY OVER THE COUNTER supplements until after surgery.  Una semana antes de la ciruga: Detener los antiinflamatorios (AINE) como Advil, Aleve, Ibuprofen, Motrin, Naproxen, Naprosyn y productos a base de aspirina como Excedrin, Goodys Powder, [employer]. Sin embargo, puede Educational psychologist tomando Tylenol si es necesario para Marketing executive de la [place]. Suspenda CUALQUIER suplemento de venta libre hasta despus de la [place].  No Alcohol for 24 hours before  or after surgery.  No Smoking including e-cigarettes for 24 hours prior to surgery.  No chewable tobacco products for at least 6 hours prior to surgery.  No nicotine patches on the day of surgery.  Do not use any "recreational" drugs for at least a week prior to your surgery.  Please be advised that the combination of cocaine and anesthesia may have negative outcomes, up to  and including death. If you test positive for cocaine, your surgery will be cancelled.  No alcohol por 24 horas antes o despus de la [place].  No fumar, incluidos los cigarrillos electrnicos, durante las 24 horas previas a la [place]. No productos de tabaco masticables durante al menos 6 horas antes de la [place]. Sin parches de Optometrist de la [place].  No use ningn medicamento "recreativo" durante al menos una semana antes de su [place]. Tenga en cuenta que la combinacin de cocana y anestesia puede [address], incluso la [place]. Si la prueba de Musician positivo, se cancelar la ciruga. ----------------------------------------------------------------------------------------------------------------------------------------------------------------------------------------------------------------------- On the morning of surgery brush your teeth with toothpaste and water, you may rinse your mouth with mouthwash if you wish. Do not swallow any toothpaste or mouthwash.  Use CHG Soap or wipes as directed on instruction sheet.  Do not wear lotions, powders, or perfumes.   Do not shave body from the neck down 48 hours prior to surgery just in case you cut yourself which could leave a site for infection.  Also, freshly shaved skin may become irritated if using the CHG soap.  Contact lenses, hearing aids and dentures may not be worn into surgery.  Do not bring valuables to the hospital. [facility] is not responsible for any missing/lost belongings or valuables.   Notify your doctor if there  is any change in your medical condition (cold, fever, infection).  Wear comfortable clothing (specific to your surgery type) to the hospital.  En la maana de la ciruga cepllese los dientes con pasta dental y agua, puede enjuagarse la boca con enjuague bucal si lo desea. No trague ninguna pasta de dientes o enjuague bucal.  Use CHG Soap o toallitas como se indica en la hoja de instrucciones.  No use lociones, talcos ni perfumes.  No se afeite el cuerpo desde el cuello hacia abajo 48 horas antes de la ciruga en caso de que se corte, lo que podra dejar un sitio para la infeccin. Adems, la piel recin afeitada puede irritarse si se [place] el jabn CHG.  No se pueden usar lentes de contacto, audfonos ni dentaduras postizas en la ciruga.  No lleve objetos de valor al hospital. [facility] no se responsabiliza por pertenencias u objetos de valor extraviados o perdidos.  Informe a su mdico si hay algn cambio en su condicin mdica (resfriado, fiebre, infeccin).  Use ropa cmoda (especfica para su tipo de [place]) al hospital. ----------------------------------------------------------------------------------------------------------------------------------------------------------------------------------------------------------------------  If you are being discharged the day of surgery, you will not be allowed to drive home. You will need a responsible adult (18 years or older) to drive you home and stay with you that night.   If you are taking public transportation, you will need to have a responsible adult (18 years or older) with you. Please confirm with your physician that it is acceptable to use public transportation.   Please call the Pre-admissions Testing Dept. at [phone] if you have any questions about these instructions.  Si le dan de alta el da de la [place], no se le permitir conducir hasta su casa. Necesitar un adulto responsable (mayor de 18 aos) que lo lleve  a su casa y se quede con usted esa noche.  Si viaja en transporte pblico, deber ir acompaado de un adulto responsable (mayor de 18 aos). Confirme con su mdico que es aceptable usar el transporte pblico.  Llame al [place] de Preadmisin al [phone] si [place]  pregunta sobre estas instrucciones.  --------------------------------------------------------------------------------------------------------------------------------------------------------------------------------------------------------------------- Surgery Visitation Policy:  Patients undergoing a surgery or procedure may have one family member or support person with them as long as that person is not COVID-19 positive or experiencing its symptoms.  That person may remain in the waiting area during the procedure and may rotate out with other people.  Poltica de visitas a la ciruga:  Los [employer] se someten a [place] o procedimiento pueden Materials engineer o persona de apoyo con ellos, siempre que esa persona no sea COVID-19 positiva o experimente sus sntomas. Esa persona Insurance claims handler en la sala de espera durante el procedimiento y puede rotar con Economist. ----------------------------------------------------------------------------------------------------------------------------------------------------------------------------------------------------------------------- Inpatient Visitation:    Visiting hours are 7 a.m. to 8 p.m. Up to two visitors ages 16+ are allowed at one time in a patient room. The visitors may rotate out with other people during the day. Visitors must check out when they leave, or other visitors will not be allowed. One designated support person may remain overnight. The visitor must pass COVID-19 screenings, use hand sanitizer when entering and exiting the patient's room and wear a mask at all times, including in the patient's room. Patients must also wear a  mask when staff or their visitor are in the room. Masking is required regardless of vaccination status.   Visita de pacientes hospitalizados:  El horario de visita es de 7 a. m. a 8 p. m. Se permiten hasta dos visitantes mayores de 16 aos a la vez en la habitacin de un paciente. Los visitantes pueden rotar con Garment/textile technologist. Los visitantes deben registrarse cuando se van, o no se permitirn otros visitantes. [place] persona de apoyo designada puede permanecer durante la noche. El visitante debe pasar las pruebas de deteccin de COVID-19, usar desinfectante para manos al entrar y salir de la habitacin del paciente y usar una mscara en todo momento, incluso en la habitacin del [place]. Los pacientes tambin deben usar una mascarilla cuando el personal o sus visitantes estn en la habitacin. Se requiere enmascaramiento independientemente del estado de vacunacin.

## 2020-12-10 ENCOUNTER — Telehealth: Payer: Self-pay | Admitting: *Deleted

## 2020-12-10 ENCOUNTER — Other Ambulatory Visit: Payer: Self-pay

## 2020-12-10 ENCOUNTER — Encounter: Payer: Self-pay | Admitting: Surgery

## 2020-12-10 ENCOUNTER — Encounter
Admission: RE | Admit: 2020-12-10 | Discharge: 2020-12-10 | Disposition: A | Discharge status: Home / Self Care | Payer: Medicaid Other | Source: Ambulatory Visit | Attending: Surgery | Admitting: Surgery

## 2020-12-10 NOTE — Patient Instructions (Signed)
Your procedure is scheduled on: Tuesday December 14, 2020. Su procedimiento est programado para: Martes 4 de [place] del 2022. Report to Day Surgery inside [facility] 2nd floor stop by admissions desk before getting on elevator. Presntese a: Diplomatic Services operational officer del [facility] 2ndo piso registrese primero antes de subir al elevador. To find out your arrival time please call [phone] between 1PM - 3PM on Monday December 13, 2020. Para saber su hora de llegada por favor llame al [phone] entre la 1PM - 3PM el [id number] 3 de [place] del 2022   Remember: Instructions that are not followed completely may result in serious medical risk, up to and including death,  or upon the discretion of your surgeon and anesthesiologist your surgery may need to be rescheduled.  Recuerde: Las instrucciones que no se siguen completamente Armed forces logistics/support/administrative officer en un riesgo de salud grave, incluyendo hasta  la [place] o a discrecin de su cirujano y Scientific laboratory technician, su ciruga se puede posponer.   __X_ 1.Do not eat food after midnight the night before your procedure. No    gum chewing or hard candies. You may drink clear liquids up to 2 hours     before you are scheduled to arrive for your surgery- DO not drink clear     Liquids within 2 hours of the start of your surgery.     Clear Liquids include:    water, apple juice without pulp, clear carbohydrate drink such as    Clearfast of [place]ade, Black Coffee or Tea (Do not add anything to coffee or tea).      No coma nada despus de la medianoche de la noche anterior a su    procedimiento. No coma chicles ni caramelos duros. Puede tomar    lquidos claros hasta 2 horas antes de su hora programada de llegada al     hospital para su procedimiento. No tome lquidos claros durante el     transcurso de las 2 horas de su llegada programada al hospital para su     procedimiento, ya que esto puede llevar a que su procedimiento se    retrase o tenga que volver a  Magazine features editor.  Los lquidos claros incluyen:          - Agua o jugo de [place] sin pulpa          - Bebidas claras con carbohidratos como ClearFast o Gatorade          - Caf negro o t claro (sin leche, sin cremas, no agregue nada al caf ni al t)  No tome nada que no est en esta lista.  Los pacientes con diabetes tipo 1 y tipo 2 solo deben Printmaker.  Llame a la clnica de PreCare o a la unidad de Same Day Surgery si  tiene alguna pregunta sobre estas instrucciones.              _X__ 2.Do Not Smoke or use e-cigarettes For 24 Hours Prior to Your Surgery.    Do not use any chewable tobacco products for at least 6   hours prior to surgery.    No fume ni use cigarrillos electrnicos durante las 24 horas previas    a su [place].  No use ningn producto de tabaco masticable durante   al menos 6 horas antes de la [place].     __X_ 3. No alcohol for 24 hours before or after surgery.    No tome alcohol durante las 24 horas antes ni despus  de la [place].   __X__4. On the morning of surgery brush your teeth with toothpaste and water, you                may rinse your mouth with mouthwash if you wish.  Do not swallow any toothpaste of mouthwash.   En la maana de la [place], cepllese los dientes con pasta de dientes y [place],                [place] enjuagarse la boca con enjuague bucal si lo desea. No ingiera ninguna pasta de dientes o enjuague bucal.   __X__ 5. Notify your doctor if there is any change in your medical condition (cold,fever, infections).    Informe a su mdico si hay algn cambio en su condicin mdica  (resfriado, fiebre, infecciones).   Do not wear jewelry, make-up, hairpins, clips or nail polish.  No use joyas, maquillajes, pinzas/ganchos para el cabello ni esmalte de uas.  Do not wear lotions, powders, or perfumes. You may wear deodorant.  No use lociones, polvos o perfumes.  Puede usar desodorante.    Do not shave 48 hours prior to surgery. Men may shave face and  neck.  No se afeite 48 horas antes de la [place].  Los hombres pueden [employer] cara  y el cuello.   Do not bring valuables to the hospital.   No lleve objetos de valor al hospital.  [facility] is not responsible for any belongings or valuables.  [place] no se hace responsable de ningn tipo de pertenencias u objetos de Licensed conveyancer.               Contacts, dentures or bridgework may not be worn into surgery.  Los lentes de [place], las dentaduras postizas o puentes no se pueden usar en la [place].   Leave your suitcase in the car. After surgery it may be brought to your room.  Deje su maleta en el auto.  Despus de la ciruga podr traerla a su habitacin.   For patients admitted to the hospital, discharge time is determined by your  treatment team.  Para los pacientes que sean ingresados al hospital, el tiempo en el cual se le  dar de alta es determinado por su equipo de [place].   Patients discharged the day of surgery will not be allowed to drive home. A los pacientes que se les da de alta el mismo da de la ciruga no se les permitir conducir a Higher education careers adviser.   __X__ Take these medicines the morning of surgery with A SIP OF WATER:          Tome estas medicinas la maana de la ciruga con UN SORBO DE AGUA:  1. carvedilol (COREG) 6.25 MG  2. isosorbide mononitrate (IMDUR) 60 MG   3. levothyroxine (SYNTHROID) 50 MCG  4. pantoprazole (PROTONIX) 40 MG      5.  6.  ____ Fleet Enema (as directed)          Enema de Fleet (segn lo indicado)    __X__ Use CHG Soap as directed          Utilice el jabn de CHG segn lo indicado  ____ Use inhalers on the day of surgery          Use los inhaladores el da de la ciruga  ____ Stop metformin 2 days prior to surgery          Deje de tomar el metformin 2 das antes de la [place]  ____ Take 1/2 of usual insulin dose the night before surgery and none on the morning of surgery           Tome la mitad de la dosis habitual de insulina la  noche antes de la [place] y no tome nada en la maana de la             ciruga  __X__ Bonita Quin already stopped clopidogrel (PLAVIX) 75 MG 12/06/20 as instructed by your doctor.          Deje de tomar el Coumadin/Plavix/aspirina el da:  __X__ Stop Anti-inflammatories  such as Ibuprofen, Aleve, Advil, Motrin, naproxen, aspirin containing products like Goody's or BC powders.          Deje de tomar antiinflamatorios como Ibuprofen, Aleve, Advil, Motrin, naproxen, o productos con aspirinas como Goody's or BC powders.   ____ Stop supplements until after surgery            Deje de tomar suplementos hasta despus de la ciruga  ____ Bring C-Pap to the hospital          Lleve el C-Pap al hospital

## 2020-12-10 NOTE — Patient Instructions (Signed)
Your procedure is scheduled on: 12/14/2020  Report to the Registration Desk on the 1st floor of the [facility]. To find out your arrival time, please call [phone] between 1PM - 3PM on: 12/13/2020  REMEMBER: Instructions that are not followed completely may result in serious medical risk, up to and including death; or upon the discretion of your surgeon and anesthesiologist your surgery may need to be rescheduled.  Do not eat food after midnight the night before surgery.  No gum chewing, lozengers or hard candies.  You may however, drink CLEAR liquids up to 2 hours before you are scheduled to arrive for your surgery. Do not drink anything within 2 hours of your scheduled arrival time.  Clear liquids include: - water  - apple juice without pulp - gatorade (not RED, PURPLE, OR BLUE) - black coffee or tea (Do NOT add milk or creamers to the coffee or tea) Do NOT drink anything that is not on this list.   TAKE THESE MEDICATIONS THE MORNING OF SURGERY WITH A SIP OF WATER: Carvedilol Amlodipine Cetirizine Isosorbide Levothyroxine Protonix -Take one at night and am of surgery Crestor Allopurinol   Use albuterol inhaler on the day of surgery and bring to the hospital.   Follow recommendations from Cardiologist, Pulmonologist or PCP regarding stopping Aspirin, Coumadin, Plavix, Eliquis, Pradaxa, or Pletal.  One week prior to surgery: Stop Anti-inflammatories (NSAIDS) such as Advil, Aleve, Ibuprofen, Motrin, Naproxen, Naprosyn and Aspirin based products such as Excedrin, Goodys Powder, BC Powder. Stop ANY OVER THE COUNTER supplements until after surgery. You may however, continue to take Tylenol if needed for pain up until the day of surgery.  No Alcohol for 24 hours before or after surgery.  No Smoking including e-cigarettes for 24 hours prior to surgery.  No chewable tobacco products for at least 6 hours prior to surgery.  No nicotine patches on the day of surgery.  Do  not use any "recreational" drugs for at least a week prior to your surgery.  Please be advised that the combination of cocaine and anesthesia may have negative outcomes, up to and including death. If you test positive for cocaine, your surgery will be cancelled.  On the morning of surgery brush your teeth with toothpaste and water, you may rinse your mouth with mouthwash if you wish. Do not swallow any toothpaste or mouthwash.  Use CHG Soap or wipes as directed on instruction sheet.  Do not wear jewelry, make-up, hairpins, clips or nail polish.  Do not wear lotions, powders, or perfumes.   Do not shave body from the neck down 48 hours prior to surgery just in case you cut yourself which could leave a site for infection.  Also, freshly shaved skin may become irritated if using the CHG soap.  Contact lenses, hearing aids and dentures may not be worn into surgery.  Do not bring valuables to the hospital. [facility] is not responsible for any missing/lost belongings or valuables.   Total Shoulder Arthroplasty:  use Benzolyl Peroxide 5% Gel as directed on instruction sheet.  Fleets enema or bowel prep as directed.  Bring your C-PAP to the hospital with you in case you may have to spend the night.   Notify your doctor if there is any change in your medical condition (cold, fever, infection).  Wear comfortable clothing (specific to your surgery type) to the hospital.  After surgery, you can help prevent lung complications by doing breathing exercises.  Take deep breaths and cough every 1-2  hours. Your doctor may order a device called an Incentive Spirometer to help you take deep breaths. When coughing or sneezing, hold a pillow firmly against your incision with both hands. This is called "splinting." Doing this helps protect your incision. It also decreases belly discomfort.  If you are being admitted to the hospital overnight, leave your suitcase in the car. After surgery it may be  brought to your room.  If you are being discharged the day of surgery, you will not be allowed to drive home. You will need a responsible adult (18 years or older) to drive you home and stay with you that night.   If you are taking public transportation, you will need to have a responsible adult (18 years or older) with you. Please confirm with your physician that it is acceptable to use public transportation.   Please call the Pre-admissions Testing Dept. at [phone] if you have any questions about these instructions.  Surgery Visitation Policy:  Patients undergoing a surgery or procedure may have one family member or support person with them as long as that person is not COVID-19 positive or experiencing its symptoms.  That person may remain in the waiting area during the procedure and may rotate out with other people.  Inpatient Visitation:    Visiting hours are 7 a.m. to 8 p.m. Up to two visitors ages 16+ are allowed at one time in a patient room. The visitors may rotate out with other people during the day. Visitors must check out when they leave, or other visitors will not be allowed. One designated support person may remain overnight. The visitor must pass COVID-19 screenings, use hand sanitizer when entering and exiting the patient's room and wear a mask at all times, including in the patient's room. Patients must also wear a mask when staff or their visitor are in the room. Masking is required regardless of vaccination status.

## 2020-12-10 NOTE — Progress Notes (Signed)
Perioperative Services  Pre-Admission/Anesthesia Testing Clinical Review  Date: 12/12/20  Patient Demographics:  Name: Gerald Hodges DOB:   07-07-1935 MRN:   [id number]  Planned Surgical Procedure(s):    Case: [id number] Date/Time: 12/14/20 1139   Procedure: XI ROBOT ASSISTED UMBILICAL HERNIA REPAIR, recurrent - Provider requesting 3 hours/ 180 minutes for procedure.   Anesthesia type: General   Pre-op diagnosis: recurrent umbilical hernia   Location: [facility] OR ROOM 04 / [place] ORS FOR ANESTHESIA GROUP   Surgeons: Olean Ree, MD   NOTE: Available PAT nursing documentation and vital signs have been reviewed. Clinical nursing staff has updated patient's PMH/PSHx, current medication list, and drug allergies/intolerances to ensure comprehensive history available to assist in medical decision making as it pertains to the aforementioned surgical procedure and anticipated anesthetic course. Extensive review of available clinical information performed. [place] PMH and PSHx updated with any diagnoses/procedures that  may have been inadvertently omitted during his intake with the pre-admission testing department's nursing staff.  Clinical Discussion:  Gerald Hodges is a 85 y.o. male who is submitted for pre-surgical anesthesia review and clearance prior to him undergoing the above procedure. Patient has never been a smoker. Pertinent PMH includes: CAD, HFpEF, diastolic dysfunction, valvular regurgitation, angina, TIA, HTN, HLD, hypothyroidism, GERD (on daily PPI), CKD-III, OA, SNHL, recurrent umbilical hernia.  Patient is followed by cardiology (End, MD). He was last seen in the cardiology clinic on 09/14/2020; notes reviewed. At the time of his clinic visit, the patient denied any chest pain, shortness of breath, PND, orthopnea, palpitations, significant peripheral edema, vertiginous symptoms, or presyncope/syncope.  Patient with a PMH significant for cardiovascular  diagnoses.  TTE performed on 12/05/2017 revealed normal left ventricular systolic function with mild LVH; LVEF 55-60%.  Doppler parameters consistent with abnormal left ventricular relaxation (G1DD).  There was mild aortic valve regurgitation.  Myocardial perfusion imaging study performed on 12/12/2019 revealed a reduced left ventricular systolic function with an EF of 42%.  EF felt to be depressed secondary to artifact.  There was a moderate sized predominantly fixed defect in the inferolateral wall.  Study noted be challenging to interpret due to increased GI uptake.  There was mild aortic atherosclerosis noted on the CT attenuation correction images.    Repeat TTE performed on 12/17/2019 revealed a normal left ventricular systolic function with an EF 55 to 60%.  Diastolic parameters consistent with pseudonormalization (G2DD).  There was mild mitral and aortic valve regurgitation.  There was no evidence of valvular stenosis.  Patient currently on daily antiplatelet therapy using clopidogrel; compliant with therapy with no evidence of GI bleeding.  Patient is on GDMT for his HTN and HLD diagnoses.  Blood pressure well controlled at 112/88 on currently prescribed CCB, beta-blocker, diuretic, nitrate, and ARB therapies.  Patient is on a statin for his HLD.  He is not diabetic. Functional capacity, as defined by DASI, is documented as being >/= 4 METS.  No changes were made to patient's medication regimen.  Patient to follow-up with outpatient cardiology in 3 months or sooner if needed.  Gerald Hodges is scheduled for an elective ROBOT ASSISTED UMBILICAL HERNIA repair on 12/14/2020 with Dr. Ardath Sax, MD.  Patient previously scheduled for this procedure back in 09/2020, however following (+) results on QuantiFERON gold testing concerning for pulmonary tuberculosis, patient's procedure was canceled pending follow-up and clearance with the [facility] Department.patient has had  pulmonary cultures through the health department that were negative at this point.  He has  been cleared to proceed by the health department.  Given patient's past medical history significant for cardiovascular diagnoses, presurgical cardiac clearance was sought by the PAT team. Per cardiology, "based on patient's past medical history and time since his last clinic visit, patient would be at an overall ACCEPTABLE risk for the planned procedure without further cardiovascular testing or intervention at this time".  Again, this patient is on daily antiplatelet therapy. He has been instructed on recommendations for holding his clopidogrel for 5 days prior to his procedure with plans to restart as soon as postoperative bleeding risk felt to be minimized by his attending surgeon. The patient has been instructed that his last dose of his anticoagulant will be on 12/08/2020.  Patient denies previous perioperative complications with anesthesia in the past. In review of the available records, it is noted that patient underwent a general anesthetic course here (ASA III) in 12/2019 without documented complications.   Vitals with BMI 12/10/2020 11/26/2020 09/27/2020  Height _0  - -  Weight 180 lbs 185 lbs -  BMI 91.47 - -  Systolic - 829 562  Diastolic - 92 87  Pulse - 84 73    Providers/Specialists:   NOTE: Primary physician provider listed below. Patient may have been seen by APP or partner within same practice.   PROVIDER ROLE / SPECIALTY LAST Delight Stare, MD GENERAL SURGERY (SURGEON) 11/26/2020  Venita Lick, NP PRIMARY CARE PROVIDER 09/10/2020  Nelva Bush, MD CARDIOLOGY 09/14/2020   Allergies:  Aspirin and Lisinopril  Current Home Medications:   No current facility-administered medications for this encounter.    albuterol (VENTOLIN HFA) 108 (90 Base) MCG/ACT inhaler   allopurinol (ZYLOPRIM) 100 MG tablet   atorvastatin (LIPITOR) 20 MG tablet   carvedilol (COREG) 6.25 MG tablet    cetirizine (ZYRTEC) 5 MG tablet   clobetasol ointment (TEMOVATE) 0.05 %   clopidogrel (PLAVIX) 75 MG tablet   furosemide (LASIX) 40 MG tablet   isosorbide mononitrate (IMDUR) 60 MG 24 hr tablet   levothyroxine (SYNTHROID) 50 MCG tablet   losartan (COZAAR) 50 MG tablet   nitroGLYCERIN (NITROSTAT) 0.4 MG SL tablet   pantoprazole (PROTONIX) 40 MG tablet   rosuvastatin (CRESTOR) 10 MG tablet   amLODipine (NORVASC) 5 MG tablet   benzonatate (TESSALON PERLES) 100 MG capsule   predniSONE (DELTASONE) 10 MG tablet   History:   Past Medical History:  Diagnosis Date   (HFpEF) heart failure with preserved ejection fraction ([place])    a.) MPI 12/12/2019 --> EF 42%. b.) TTE 12/17/2019 --> EF 55-60%.   Abdominal aortic atherosclerosis ([place])    a.) MPI 12/12/2019--> very mild.   Anginal pain (HCC)    Chronic anticoagulation    a.) clopidogrel   CKD (chronic kidney disease), stage III (HCC)    Coronary artery disease    Diastolic dysfunction    a.) TTE 12/17/2019 --> LVEF 55-60%; G2DD.   GERD (gastroesophageal reflux disease)    Gout    History of 2019 novel coronavirus disease (COVID-19) 09/10/2020   Hyperlipidemia    Hypertension    Hypothyroidism    Osteoarthritis of left knee    Psoriasis    Recurrent umbilical hernia    a.) repaired 01/01/2020; recurred.   SNHL (sensory-neural hearing loss), asymmetrical    TIA (transient ischemic attack)    Valvular regurgitation    a.) TTE 12/17/2019 --> EF 55-60%; mild AR, TR   Past Surgical History:  Procedure Laterality Date   BUNIONECTOMY Bilateral  CATARACT EXTRACTION W/ INTRAOCULAR LENS  IMPLANT, BILATERAL Bilateral    TOTAL KNEE ARTHROPLASTY Left 10/25/2016   Procedure: TOTAL KNEE ARTHROPLASTY;  Surgeon: Earnestine Leys, MD;  Location: [facility] ORS;  Service: Orthopedics;  Laterality: Left;   UMBILICAL HERNIA REPAIR N/A 01/01/2020   Procedure: HERNIA REPAIR UMBILICAL ADULT;  Surgeon: Olean Ree, MD;  Location: [facility] ORS;  Service:  General;  Laterality: N/A;   XI ROBOTIC ASSISTED INGUINAL HERNIA REPAIR WITH MESH Left 01/01/2020   Procedure: XI ROBOTIC ASSISTED INGUINAL HERNIA REPAIR WITH MESH;  Surgeon: Olean Ree, MD;  Location: [facility] ORS;  Service: General;  Laterality: Left;   Family History  Problem Relation Age of Onset   Cancer Mother        stomach   Heart disease Neg Hx    Social History   Tobacco Use   Smoking status: Never   Smokeless tobacco: Never  Vaping Use   Vaping Use: Never used  Substance Use Topics   Alcohol use: No   Drug use: No    Pertinent Clinical Results:  LABS: Labs reviewed: Acceptable for surgery.  Lab Results  Component Value Date   WBC 5.7 09/10/2020   HGB 14.1 09/10/2020   HCT 41.1 09/10/2020   MCV 90 09/10/2020   PLT 344 09/10/2020   Lab Results  Component Value Date   NA 133 (L) 09/10/2020   K 4.8 09/10/2020   CO2 21 09/10/2020   GLUCOSE 121 (H) 09/10/2020   BUN 41 (H) 09/10/2020   CREATININE 1.88 (H) 09/10/2020   CALCIUM 9.9 09/10/2020   EGFR 35 (L) 09/10/2020   GFRNONAA 41 (L) 01/27/2020    ECG: Date: 09/14/2020 Time ECG obtained: 1518 PM Rate: 90 bpm Rhythm:  Sinus rhythm with occasional PVCs Axis (leads I and aVF): Normal Intervals: PR 206 ms. QRS 78 ms. QTc 425 ms. ST segment and T wave changes: No evidence of acute ST segment elevation or depression Comparison: Similar to previous tracing obtained on 06/09/2020   IMAGING / PROCEDURES: TRANSTHORACIC ECHOCARDIOGRAM performed on 12/17/2019 Left ventricular ejection fraction, by estimation, is 55 to 60%. The left ventricle has normal function. The left ventricle has no regional wall motion abnormalities. There is mild left ventricular hypertrophy. Left ventricular diastolic parameters are consistent with Grade II diastolic dysfunction (pseudonormalization).  Right ventricular systolic function is normal. The right ventricular size is normal.  The mitral valve is normal in structure. Mild mitral  valve regurgitation.  The aortic valve is tricuspid. Aortic valve regurgitation is mild.  The inferior vena cava is dilated in size with >50% respiratory variability, suggesting right atrial pressure of 8 mmHg.   LEXISCAN performed on 12/12/2019 LVEF 42%; possibly depressed secondary to artifact Small to moderate size region a predominantly fixed defect in the inferolateral wall.  Images challenging to interpret in the setting of increased GI uptake artifact.   No EKG changes concerning for ischemia at peak respiratory recovery CT attenuation correction images with very mild aortic atherosclerosis; no notable coronary calcifications Low risk scan  Impression and Plan:  Gerald Hodges has been referred for pre-anesthesia review and clearance prior to him undergoing the planned anesthetic and procedural courses. Available labs, pertinent testing, and imaging results were personally reviewed by me. This patient has been appropriately cleared by cardiology with an overall ACCEPTABLE risk of significant perioperative cardiovascular complications.  Based on clinical review performed today (12/12/20), barring any significant acute changes in the patient's overall condition, it is anticipated that he will be able to  proceed with the planned surgical intervention. Any acute changes in clinical condition may necessitate his procedure being postponed and/or cancelled. Patient will meet with anesthesia team (MD and/or CRNA) on the day of his procedure for preoperative evaluation/assessment. Questions regarding anesthetic course will be fielded at that time.   Pre-surgical instructions were reviewed with the patient during his PAT appointment and questions were fielded by PAT clinical staff. Patient was advised that if any questions or concerns arise prior to his procedure then he should return a call to PAT and/or his surgeon's office to discuss.  Honor Loh, MSN, APRN, FNP-C, CEN [facility]  Peri-operative Services Nurse Practitioner Phone: [phone] Fax: [phone] 12/12/20 10:14 AM  NOTE: This note has been prepared using [employer]. Despite my best ability to proofread, there is always the potential that unintentional transcriptional errors may still occur from this process.

## 2020-12-10 NOTE — Telephone Encounter (Signed)
Request for pre-operative cardiac clearance Received: Today Karen Kitchens, NP  P Cv Div Preop Callback Request for pre-operative cardiac clearance:     1. What type of surgery is being performed?   ROBOT ASSISTED UMBILICAL HERNIA REPAIR   2. When is this surgery scheduled?  12/14/2020.  Note: previously cleared by cardiology in 09/2020, however was cancelled due to concerns for pulmonary TB in the setting of a (+) quantiferon gold test. He has been seen by the health department and cleared to proceed.     3. Are there any medications that need to be held prior to surgery?  CLOPIDOGREL   4. Practice name and name of physician performing surgery?  Performing surgeon: Dr. Olean Ree, MD  Requesting clearance: Honor Loh, FNP-C       5. Anesthesia type (none, local, MAC, general)? General   6. What is the office phone and fax number?    Phone: [phone]  Fax: [phone]   ATTENTION: Unable to create telephone message as per your standard workflow. Directed by [facility] providers to send requests for cardiac clearance to this pool for appropriate distribution to provider covering pre-operative clearances.   Honor Loh, MSN, APRN, FNP-C, CEN  [facility]  Peri-operative Services Nurse Practitioner  Phone: [phone]  12/10/20 3:27 PM

## 2020-12-10 NOTE — Telephone Encounter (Signed)
   Name: Gerald Hodges  DOB: 1936/03/07  MRN: [id number]   Primary Cardiologist: Yvonne Kendall, MD  Chart reviewed as part of pre-operative protocol coverage. Patient was contacted 12/10/2020 in reference to pre-operative risk assessment for pending surgery as outlined below.  Gerald Hodges was last seen on 09/14/20 by Eula Listen PAC.  Since that day, Gerald Hodges has done well. I confirmed with his son that he has had no interval changes to his cardiac history. He can complete 4.0 METS without angina. Reassuring stress test in 12/2019. He was previously cleared to hold plavix and has been doing so since 12/06/20.  Therefore, based on ACC/AHA guidelines, the patient would be at acceptable risk for the planned procedure without further cardiovascular testing.   The patient was advised that if he develops new symptoms prior to surgery to contact our office to arrange for a follow-up visit, and he verbalized understanding.  I will route this recommendation to the requesting party via Epic fax function and remove from pre-op pool. Please call with questions.  Gerald Rutherford [place], PA 12/10/2020, 4:20 PM

## 2020-12-13 ENCOUNTER — Encounter: Payer: Self-pay | Admitting: Urgent Care

## 2020-12-13 ENCOUNTER — Encounter
Admission: RE | Admit: 2020-12-13 | Discharge: 2020-12-13 | Disposition: A | Discharge status: Home / Self Care | Payer: Medicaid Other | Source: Ambulatory Visit | Attending: Surgery | Admitting: Surgery

## 2020-12-13 ENCOUNTER — Other Ambulatory Visit: Payer: Self-pay

## 2020-12-13 ENCOUNTER — Telehealth: Payer: Self-pay

## 2020-12-13 DIAGNOSIS — Z01812 Encounter for preprocedural laboratory examination: Secondary | ICD-10-CM | POA: Diagnosis not present

## 2020-12-13 LAB — CBC
HCT: 42 % (ref 39.0–52.0)
Hemoglobin: 14.7 g/dL (ref 13.0–17.0)
MCH: 31.3 pg (ref 26.0–34.0)
MCHC: 35 g/dL (ref 30.0–36.0)
MCV: 89.6 fL (ref 80.0–100.0)
Platelets: 246 10*3/uL (ref 150–400)
RBC: 4.69 MIL/uL (ref 4.22–5.81)
RDW: 13.6 % (ref 11.5–15.5)
WBC: 4.8 10*3/uL (ref 4.0–10.5)
nRBC: 0 % (ref 0.0–0.2)

## 2020-12-13 LAB — COMPREHENSIVE METABOLIC PANEL
ALT: 15 U/L (ref 0–44)
AST: 23 U/L (ref 15–41)
Albumin: 4.2 g/dL (ref 3.5–5.0)
Alkaline Phosphatase: 75 U/L (ref 38–126)
Anion gap: 7 (ref 5–15)
BUN: 28 mg/dL — ABNORMAL HIGH (ref 8–23)
CO2: 29 mmol/L (ref 22–32)
Calcium: 9.4 mg/dL (ref 8.9–10.3)
Chloride: 102 mmol/L (ref 98–111)
Creatinine, Ser: 1.37 mg/dL — ABNORMAL HIGH (ref 0.61–1.24)
GFR, Estimated: 52 mL/min — ABNORMAL LOW (ref >60)
Glucose, Bld: 103 mg/dL — ABNORMAL HIGH (ref 70–99)
Potassium: 3.9 mmol/L (ref 3.5–5.1)
Sodium: 138 mmol/L (ref 135–145)
Total Bilirubin: 1.9 mg/dL — ABNORMAL HIGH (ref 0.3–1.2)
Total Protein: 7.5 g/dL (ref 6.5–8.1)

## 2020-12-13 NOTE — Telephone Encounter (Signed)
Received cardiac clearance from Dr. Cristal Deer End. Pt would be acceptable risk for the planned procedure without further cardiovascular testing. He was previous cleared to hold plavix and has been doing so since 12/06/2020.  See cardiac note in chart.

## 2020-12-14 ENCOUNTER — Ambulatory Visit: Payer: Medicaid Other | Admitting: Urgent Care

## 2020-12-14 ENCOUNTER — Encounter
Admission: RE | Disposition: A | Discharge status: Home / Self Care | Payer: Self-pay | Source: Home / Self Care | Attending: Surgery

## 2020-12-14 ENCOUNTER — Encounter: Payer: Self-pay | Admitting: Surgery

## 2020-12-14 ENCOUNTER — Other Ambulatory Visit: Payer: Self-pay

## 2020-12-14 ENCOUNTER — Ambulatory Visit
Admission: RE | Admit: 2020-12-14 | Discharge: 2020-12-14 | Disposition: A | Discharge status: Home / Self Care | Payer: Medicaid Other | Attending: Surgery | Admitting: Surgery

## 2020-12-14 DIAGNOSIS — I2510 Atherosclerotic heart disease of native coronary artery without angina pectoris: Secondary | ICD-10-CM | POA: Insufficient documentation

## 2020-12-14 DIAGNOSIS — I5032 Chronic diastolic (congestive) heart failure: Secondary | ICD-10-CM | POA: Diagnosis not present

## 2020-12-14 DIAGNOSIS — Z9889 Other specified postprocedural states: Secondary | ICD-10-CM

## 2020-12-14 DIAGNOSIS — E039 Hypothyroidism, unspecified: Secondary | ICD-10-CM | POA: Insufficient documentation

## 2020-12-14 DIAGNOSIS — Z888 Allergy status to other drugs, medicaments and biological substances status: Secondary | ICD-10-CM | POA: Insufficient documentation

## 2020-12-14 DIAGNOSIS — Z79890 Hormone replacement therapy (postmenopausal): Secondary | ICD-10-CM | POA: Insufficient documentation

## 2020-12-14 DIAGNOSIS — Z79899 Other long term (current) drug therapy: Secondary | ICD-10-CM | POA: Diagnosis not present

## 2020-12-14 DIAGNOSIS — N183 Chronic kidney disease, stage 3 unspecified: Secondary | ICD-10-CM | POA: Diagnosis not present

## 2020-12-14 DIAGNOSIS — Z96652 Presence of left artificial knee joint: Secondary | ICD-10-CM | POA: Insufficient documentation

## 2020-12-14 DIAGNOSIS — I130 Hypertensive heart and chronic kidney disease with heart failure and stage 1 through stage 4 chronic kidney disease, or unspecified chronic kidney disease: Secondary | ICD-10-CM | POA: Insufficient documentation

## 2020-12-14 DIAGNOSIS — Z7901 Long term (current) use of anticoagulants: Secondary | ICD-10-CM | POA: Diagnosis not present

## 2020-12-14 DIAGNOSIS — Z886 Allergy status to analgesic agent status: Secondary | ICD-10-CM | POA: Diagnosis not present

## 2020-12-14 DIAGNOSIS — Z8616 Personal history of COVID-19: Secondary | ICD-10-CM | POA: Diagnosis not present

## 2020-12-14 DIAGNOSIS — Z8719 Personal history of other diseases of the digestive system: Secondary | ICD-10-CM

## 2020-12-14 DIAGNOSIS — K429 Umbilical hernia without obstruction or gangrene: Secondary | ICD-10-CM | POA: Insufficient documentation

## 2020-12-14 DIAGNOSIS — E785 Hyperlipidemia, unspecified: Secondary | ICD-10-CM | POA: Insufficient documentation

## 2020-12-14 DIAGNOSIS — Z8673 Personal history of transient ischemic attack (TIA), and cerebral infarction without residual deficits: Secondary | ICD-10-CM | POA: Diagnosis not present

## 2020-12-14 DIAGNOSIS — Z7902 Long term (current) use of antithrombotics/antiplatelets: Secondary | ICD-10-CM | POA: Diagnosis not present

## 2020-12-14 HISTORY — DX: Psoriasis, unspecified: L40.9

## 2020-12-14 HISTORY — DX: Chronic kidney disease, stage 3 unspecified: N18.30

## 2020-12-14 HISTORY — DX: Endocarditis, valve unspecified: I38

## 2020-12-14 HISTORY — DX: Unilateral primary osteoarthritis, left knee: M17.12

## 2020-12-14 HISTORY — DX: Umbilical hernia without obstruction or gangrene: K42.9

## 2020-12-14 HISTORY — DX: Sensorineural hearing loss, bilateral: H90.3

## 2020-12-14 HISTORY — DX: Long term (current) use of anticoagulants: Z79.01

## 2020-12-14 HISTORY — DX: Other ill-defined heart diseases: I51.89

## 2020-12-14 HISTORY — DX: Atherosclerosis of aorta: I70.0

## 2020-12-14 HISTORY — DX: Unspecified diastolic (congestive) heart failure: I50.30

## 2020-12-14 LAB — POCT I-STAT, CHEM 8
BUN: 25 mg/dL — ABNORMAL HIGH (ref 8–23)
Calcium, Ion: 1.21 mmol/L (ref 1.15–1.40)
Chloride: 101 mmol/L (ref 98–111)
Creatinine, Ser: 1.3 mg/dL — ABNORMAL HIGH (ref 0.61–1.24)
Glucose, Bld: 98 mg/dL (ref 70–99)
HCT: 48 % (ref 39.0–52.0)
Hemoglobin: 16.3 g/dL (ref 13.0–17.0)
Potassium: 3.6 mmol/L (ref 3.5–5.1)
Sodium: 140 mmol/L (ref 135–145)
TCO2: 27 mmol/L (ref 22–32)

## 2020-12-14 SURGERY — REPAIR, HERNIA, UMBILICAL, ROBOT-ASSISTED
Anesthesia: General

## 2020-12-14 MED ORDER — CHLORHEXIDINE GLUCONATE CLOTH 2 % EX PADS
6.0000 | MEDICATED_PAD | Freq: Once | CUTANEOUS | Status: AC
  Administered 2020-12-14: 6 via TOPICAL

## 2020-12-14 MED ORDER — BUPIVACAINE-EPINEPHRINE (PF) 0.25% -1:200000 IJ SOLN
INTRAMUSCULAR | Status: AC
  Filled 2020-12-14: qty 30

## 2020-12-14 MED ORDER — GABAPENTIN 300 MG PO CAPS
ORAL_CAPSULE | ORAL | Status: AC
  Administered 2020-12-14: 300 mg via ORAL
  Filled 2020-12-14: qty 1

## 2020-12-14 MED ORDER — LABETALOL HCL 5 MG/ML IV SOLN
10.0000 mg | INTRAVENOUS | Status: DC | PRN
  Administered 2020-12-14: 10 mg via INTRAVENOUS

## 2020-12-14 MED ORDER — ROCURONIUM BROMIDE 100 MG/10ML IV SOLN
INTRAVENOUS | Status: DC | PRN
  Administered 2020-12-14: 10 mg via INTRAVENOUS
  Administered 2020-12-14: 20 mg via INTRAVENOUS
  Administered 2020-12-14: 40 mg via INTRAVENOUS

## 2020-12-14 MED ORDER — ACETAMINOPHEN 500 MG PO TABS
ORAL_TABLET | ORAL | Status: AC
  Administered 2020-12-14: 1000 mg via ORAL
  Filled 2020-12-14: qty 2

## 2020-12-14 MED ORDER — FENTANYL CITRATE (PF) 100 MCG/2ML IJ SOLN: 25.0000 ug | INTRAMUSCULAR | Status: DC | PRN

## 2020-12-14 MED ORDER — LABETALOL HCL 5 MG/ML IV SOLN
INTRAVENOUS | Status: AC
  Filled 2020-12-14: qty 4

## 2020-12-14 MED ORDER — PROPOFOL 10 MG/ML IV BOLUS
INTRAVENOUS | Status: AC
  Filled 2020-12-14: qty 20

## 2020-12-14 MED ORDER — CEFAZOLIN SODIUM-DEXTROSE 2-4 GM/100ML-% IV SOLN
2.0000 g | INTRAVENOUS | Status: AC
  Administered 2020-12-14: 2 g via INTRAVENOUS

## 2020-12-14 MED ORDER — OXYCODONE HCL 5 MG PO TABS: 5.0000 mg | ORAL_TABLET | ORAL | 0 refills | Status: DC | PRN

## 2020-12-14 MED ORDER — CEFAZOLIN SODIUM-DEXTROSE 2-4 GM/100ML-% IV SOLN
INTRAVENOUS | Status: AC
  Filled 2020-12-14: qty 100

## 2020-12-14 MED ORDER — FENTANYL CITRATE (PF) 100 MCG/2ML IJ SOLN
INTRAMUSCULAR | Status: AC
  Filled 2020-12-14: qty 2

## 2020-12-14 MED ORDER — ONDANSETRON HCL 4 MG/2ML IJ SOLN
INTRAMUSCULAR | Status: DC | PRN
  Administered 2020-12-14: 4 mg via INTRAVENOUS

## 2020-12-14 MED ORDER — SUCCINYLCHOLINE CHLORIDE 200 MG/10ML IV SOSY
PREFILLED_SYRINGE | INTRAVENOUS | Status: DC | PRN
  Administered 2020-12-14: 100 mg via INTRAVENOUS

## 2020-12-14 MED ORDER — ACETAMINOPHEN 500 MG PO TABS: 1000.0000 mg | ORAL_TABLET | ORAL | Status: AC

## 2020-12-14 MED ORDER — PHENYLEPHRINE HCL (PRESSORS) 10 MG/ML IV SOLN
INTRAVENOUS | Status: DC | PRN
  Administered 2020-12-14: 100 ug via INTRAVENOUS

## 2020-12-14 MED ORDER — ONDANSETRON HCL 4 MG/2ML IJ SOLN
4.0000 mg | Freq: Once | INTRAMUSCULAR | Status: AC | PRN
  Administered 2020-12-14: 4 mg via INTRAVENOUS

## 2020-12-14 MED ORDER — OXYCODONE HCL 5 MG PO TABS
ORAL_TABLET | ORAL | Status: AC
  Filled 2020-12-14: qty 1

## 2020-12-14 MED ORDER — PROPOFOL 10 MG/ML IV BOLUS
INTRAVENOUS | Status: DC | PRN
  Administered 2020-12-14: 140 mg via INTRAVENOUS

## 2020-12-14 MED ORDER — OXYCODONE HCL 5 MG/5ML PO SOLN: 5.0000 mg | Freq: Once | ORAL | Status: AC | PRN

## 2020-12-14 MED ORDER — ORAL CARE MOUTH RINSE: 15.0000 mL | Freq: Once | OROMUCOSAL | Status: AC

## 2020-12-14 MED ORDER — FENTANYL CITRATE (PF) 100 MCG/2ML IJ SOLN
INTRAMUSCULAR | Status: DC | PRN
  Administered 2020-12-14 (×2): 50 ug via INTRAVENOUS

## 2020-12-14 MED ORDER — LIDOCAINE HCL (CARDIAC) PF 100 MG/5ML IV SOSY
PREFILLED_SYRINGE | INTRAVENOUS | Status: DC | PRN
  Administered 2020-12-14: 80 mg via INTRAVENOUS

## 2020-12-14 MED ORDER — LACTATED RINGERS IV SOLN: INTRAVENOUS | Status: DC

## 2020-12-14 MED ORDER — ONDANSETRON HCL 4 MG/2ML IJ SOLN
INTRAMUSCULAR | Status: AC
  Filled 2020-12-14: qty 2

## 2020-12-14 MED ORDER — FAMOTIDINE 20 MG PO TABS
20.0000 mg | ORAL_TABLET | Freq: Once | ORAL | Status: AC
  Administered 2020-12-14: 20 mg via ORAL

## 2020-12-14 MED ORDER — BUPIVACAINE LIPOSOME 1.3 % IJ SUSP
INTRAMUSCULAR | Status: AC
  Filled 2020-12-14: qty 20

## 2020-12-14 MED ORDER — ACETAMINOPHEN 500 MG PO TABS: 1000.0000 mg | ORAL_TABLET | Freq: Four times a day (QID) | ORAL | Status: DC | PRN

## 2020-12-14 MED ORDER — SODIUM CHLORIDE 0.9 % IV SOLN
INTRAVENOUS | Status: DC | PRN
  Administered 2020-12-14: 25 ug/min via INTRAVENOUS

## 2020-12-14 MED ORDER — EPHEDRINE SULFATE 50 MG/ML IJ SOLN
INTRAMUSCULAR | Status: DC | PRN
  Administered 2020-12-14: 5 mg via INTRAVENOUS

## 2020-12-14 MED ORDER — DEXAMETHASONE SODIUM PHOSPHATE 10 MG/ML IJ SOLN
INTRAMUSCULAR | Status: DC | PRN
  Administered 2020-12-14: 5 mg via INTRAVENOUS

## 2020-12-14 MED ORDER — GLYCOPYRROLATE 0.2 MG/ML IJ SOLN
INTRAMUSCULAR | Status: DC | PRN
  Administered 2020-12-14: .2 mg via INTRAVENOUS

## 2020-12-14 MED ORDER — FAMOTIDINE 20 MG PO TABS
ORAL_TABLET | ORAL | Status: AC
  Filled 2020-12-14: qty 1

## 2020-12-14 MED ORDER — BUPIVACAINE LIPOSOME 1.3 % IJ SUSP: 20.0000 mL | Freq: Once | INTRAMUSCULAR | Status: DC

## 2020-12-14 MED ORDER — CARVEDILOL 12.5 MG PO TABS: 6.2500 mg | ORAL_TABLET | Freq: Once | ORAL | Status: AC

## 2020-12-14 MED ORDER — ACETAMINOPHEN 10 MG/ML IV SOLN: 1000.0000 mg | Freq: Once | INTRAVENOUS | Status: DC | PRN

## 2020-12-14 MED ORDER — OXYCODONE HCL 5 MG PO TABS
5.0000 mg | ORAL_TABLET | Freq: Once | ORAL | Status: AC | PRN
  Administered 2020-12-14: 5 mg via ORAL

## 2020-12-14 MED ORDER — GABAPENTIN 300 MG PO CAPS: 300.0000 mg | ORAL_CAPSULE | ORAL | Status: AC

## 2020-12-14 MED ORDER — BUPIVACAINE-EPINEPHRINE 0.25% -1:200000 IJ SOLN
INTRAMUSCULAR | Status: DC | PRN
  Administered 2020-12-14: 50 mL via INTRAMUSCULAR

## 2020-12-14 MED ORDER — EPHEDRINE 5 MG/ML INJ
INTRAVENOUS | Status: AC
  Filled 2020-12-14: qty 5

## 2020-12-14 MED ORDER — CHLORHEXIDINE GLUCONATE 0.12 % MT SOLN
OROMUCOSAL | Status: AC
  Administered 2020-12-14: 15 mL via OROMUCOSAL
  Filled 2020-12-14: qty 15

## 2020-12-14 MED ORDER — CHLORHEXIDINE GLUCONATE 0.12 % MT SOLN: 15.0000 mL | Freq: Once | OROMUCOSAL | Status: AC

## 2020-12-14 MED ORDER — SODIUM CHLORIDE 0.9 % IV SOLN: INTRAVENOUS | Status: DC

## 2020-12-14 MED ORDER — CARVEDILOL 12.5 MG PO TABS
ORAL_TABLET | ORAL | Status: AC
  Administered 2020-12-14: 6.25 mg via ORAL
  Filled 2020-12-14: qty 1

## 2020-12-14 MED ORDER — SUGAMMADEX SODIUM 200 MG/2ML IV SOLN
INTRAVENOUS | Status: DC | PRN
  Administered 2020-12-14: 200 mg via INTRAVENOUS

## 2020-12-14 SURGICAL SUPPLY — 56 items
BLADE SURG SZ11 CARB STEEL (BLADE) ×2 IMPLANT
CANNULA REDUC XI 12-8 STAPL (CANNULA) ×1
CANNULA REDUCER 12-8 DVNC XI (CANNULA) ×1 IMPLANT
CHLORAPREP W/TINT 26 (MISCELLANEOUS) ×2 IMPLANT
COVER TIP SHEARS 8 DVNC (MISCELLANEOUS) ×1 IMPLANT
COVER TIP SHEARS 8MM DA VINCI (MISCELLANEOUS) ×1
DEFOGGER SCOPE WARMER CLEARIFY (MISCELLANEOUS) ×2 IMPLANT
DERMABOND ADVANCED (GAUZE/BANDAGES/DRESSINGS) ×1
DERMABOND ADVANCED .7 DNX12 (GAUZE/BANDAGES/DRESSINGS) ×1 IMPLANT
DRAPE ARM DVNC X/XI (DISPOSABLE) ×4 IMPLANT
DRAPE COLUMN DVNC XI (DISPOSABLE) ×1 IMPLANT
DRAPE DA VINCI XI ARM (DISPOSABLE) ×4
DRAPE DA VINCI XI COLUMN (DISPOSABLE) ×1
ELECT CAUTERY BLADE TIP 2.5 (TIP) ×2
ELECT REM PT RETURN 9FT ADLT (ELECTROSURGICAL) ×2
ELECTRODE CAUTERY BLDE TIP 2.5 (TIP) ×1 IMPLANT
ELECTRODE REM PT RTRN 9FT ADLT (ELECTROSURGICAL) ×1 IMPLANT
GAUZE 4X4 16PLY ~~LOC~~+RFID DBL (SPONGE) ×2 IMPLANT
GLOVE SURG SYN 7.0 (GLOVE) ×4 IMPLANT
GLOVE SURG SYN 7.5  E (GLOVE) ×2
GLOVE SURG SYN 7.5 E (GLOVE) ×2 IMPLANT
GOWN STRL REUS W/ TWL LRG LVL3 (GOWN DISPOSABLE) ×3 IMPLANT
GOWN STRL REUS W/TWL LRG LVL3 (GOWN DISPOSABLE) ×3
GRASPER SUT TROCAR 14GX15 (MISCELLANEOUS) ×2 IMPLANT
IRRIGATION STRYKERFLOW (MISCELLANEOUS) IMPLANT
IRRIGATOR STRYKERFLOW (MISCELLANEOUS)
IV NS 1000ML (IV SOLUTION)
IV NS 1000ML BAXH (IV SOLUTION) IMPLANT
KIT PINK PAD W/HEAD ARE REST (MISCELLANEOUS) ×2
KIT PINK PAD W/HEAD ARM REST (MISCELLANEOUS) ×1 IMPLANT
LABEL OR SOLS (LABEL) ×2 IMPLANT
MANIFOLD NEPTUNE II (INSTRUMENTS) ×2 IMPLANT
MESH VENT LT ST 11.4CM CRL (Mesh General) ×2 IMPLANT
NEEDLE HYPO 22GX1.5 SAFETY (NEEDLE) ×2 IMPLANT
NEEDLE INSUFFLATION 14GA 120MM (NEEDLE) ×2 IMPLANT
OBTURATOR OPTICAL STANDARD 8MM (TROCAR) ×1
OBTURATOR OPTICAL STND 8 DVNC (TROCAR) ×1
OBTURATOR OPTICALSTD 8 DVNC (TROCAR) ×1 IMPLANT
PACK LAP CHOLECYSTECTOMY (MISCELLANEOUS) ×2 IMPLANT
PENCIL ELECTRO HAND CTR (MISCELLANEOUS) ×2 IMPLANT
SEAL CANN UNIV 5-8 DVNC XI (MISCELLANEOUS) ×2 IMPLANT
SEAL XI 5MM-8MM UNIVERSAL (MISCELLANEOUS) ×2
SET TUBE SMOKE EVAC HIGH FLOW (TUBING) ×2 IMPLANT
SOLUTION ELECTROLUBE (MISCELLANEOUS) ×2 IMPLANT
SPONGE T-LAP 18X18 ~~LOC~~+RFID (SPONGE) ×2 IMPLANT
STAPLER CANNULA SEAL DVNC XI (STAPLE) ×1 IMPLANT
STAPLER CANNULA SEAL XI (STAPLE) ×1
SUT MNCRL 4-0 (SUTURE) ×3
SUT MNCRL 4-0 27XMFL (SUTURE) ×3
SUT STRATAFIX PDS 30 CT-1 (SUTURE) ×2 IMPLANT
SUT VICRYL 0 AB UR-6 (SUTURE) ×4 IMPLANT
SUT VLOC 90 2/L VL 12 GS22 (SUTURE) ×6 IMPLANT
SUTURE MNCRL 4-0 27XMF (SUTURE) ×3 IMPLANT
TAPE TRANSPORE STRL 2 31045 (GAUZE/BANDAGES/DRESSINGS) ×2 IMPLANT
TRAY FOLEY SLVR 16FR LF STAT (SET/KITS/TRAYS/PACK) ×2 IMPLANT
WATER STERILE IRR 500ML POUR (IV SOLUTION) ×2 IMPLANT

## 2020-12-14 NOTE — Anesthesia Procedure Notes (Signed)
Procedure Name: Intubation Date/Time: 12/14/2020 2:49 PM Performed by: Fredderick Phenix, CRNA Pre-anesthesia Checklist: Patient identified, Emergency Drugs available, Suction available and Patient being monitored Patient Re-evaluated:Patient Re-evaluated prior to induction Oxygen Delivery Method: Circle system utilized Preoxygenation: Pre-oxygenation with 100% oxygen Induction Type: IV induction Ventilation: Mask ventilation without difficulty Laryngoscope Size: Mac and 4 Grade View: Grade I Tube type: Oral Tube size: 7.5 mm Number of attempts: 1 Airway Equipment and Method: Stylet and Oral airway Placement Confirmation: ETT inserted through vocal cords under direct vision, positive ETCO2 and breath sounds checked- equal and bilateral Secured at: 21 cm Tube secured with: Tape Dental Injury: Teeth and Oropharynx as per pre-operative assessment

## 2020-12-14 NOTE — Anesthesia Preprocedure Evaluation (Addendum)
Anesthesia Evaluation  Patient identified by MRN, date of birth, ID band Patient awake    Reviewed: Allergy & Precautions, NPO status , Patient's Chart, lab work & pertinent test results  Airway Mallampati: III   Neck ROM: Full    Dental  (+) Lower Dentures, Upper Dentures   Pulmonary  COVID+ 09/10/20   Pulmonary exam normal breath sounds clear to auscultation       Cardiovascular hypertension, + CAD and +CHF  Normal cardiovascular exam+ Valvular Problems/Murmurs (AR, TR)  Rhythm:Regular Rate:Normal  ECG 09/14/20: SR with occasional PVCs  TTE 12/05/17: normal left ventricular systolic function with mild LVH; LVEF 55-60%.  Doppler parameters consistent with abnormal left ventricular relaxation (G1DD).  There was mild aortic valve regurgitation.  Myocardial perfusion imaging study 12/12/19: reduced left ventricular systolic function with an EF of 42%.  EF felt to be depressed secondary to artifact.  There was a moderate sized predominantly fixed defect in the inferolateral wall.  Study noted be challenging to interpret due to increased GI uptake.  There was mild aortic atherosclerosis noted on the CT attenuation correction images.    Repeat TTE 12/17/19: normal left ventricular systolic function with an EF 55 to 60%.  Diastolic parameters consistent with pseudonormalization (G2DD).  There was mild mitral and aortic valve regurgitation.  There was no evidence of valvular stenosis.   Neuro/Psych HOH TIA (on Plavix)   GI/Hepatic GERD  ,  Endo/Other  Hypothyroidism   Renal/GU Renal disease (stage III CKD)     Musculoskeletal  (+) Arthritis ,   Abdominal   Peds  Hematology negative hematology ROS (+)   Anesthesia Other Findings Cardiac clearance in Epic.  Reproductive/Obstetrics                            Anesthesia Physical Anesthesia Plan  ASA: 3  Anesthesia Plan: General   Post-op Pain  Management:    Induction: Intravenous  PONV Risk Score and Plan: 2 and Ondansetron, Dexamethasone and Treatment may vary due to age or medical condition  Airway Management Planned: Oral ETT  Additional Equipment:   Intra-op Plan:   Post-operative Plan: Extubation in OR  Informed Consent: I have reviewed the patients History and Physical, chart, labs and discussed the procedure including the risks, benefits and alternatives for the proposed anesthesia with the patient or authorized representative who has indicated his/her understanding and acceptance.     Plan/risks discussed with: in [place].  Plan Discussed with: CRNA  Anesthesia Plan Comments:        Anesthesia Quick Evaluation

## 2020-12-14 NOTE — Transfer of Care (Signed)
Immediate Anesthesia Transfer of Care Note  Patient: Deontre Allsup  Procedure(s) Performed: XI ROBOT ASSISTED UMBILICAL HERNIA REPAIR, recurrent  Patient Location: PACU  Anesthesia Type:General  Level of Consciousness: awake, alert  and patient cooperative  Airway & Oxygen Therapy: Patient Spontanous Breathing and Patient connected to face mask oxygen  Post-op Assessment: Report given to RN and Post -op Vital signs reviewed and stable  Post vital signs: Reviewed and stable  Last Vitals:  Vitals Value Taken Time  BP 164/96   Temp    Pulse 82 12/14/20 1720  Resp 17 12/14/20 1720  SpO2 98 % 12/14/20 1720  Vitals shown include unvalidated device data.  Last Pain:  Vitals:   12/14/20 1058  TempSrc: Temporal  PainSc: 0-No pain         Complications: No notable events documented.

## 2020-12-14 NOTE — Progress Notes (Signed)
Patients teeth and bilateral hearing aids returned

## 2020-12-14 NOTE — Interval H&P Note (Signed)
History and Physical Interval Note:  12/14/2020 2:24 PM  Gerald Hodges  has presented today for surgery, with the diagnosis of recurrent umbilical hernia.  The various methods of treatment have been discussed with the patient and family. After consideration of risks, benefits and other options for treatment, the patient has consented to  Procedure(s) with comments: XI ROBOT ASSISTED UMBILICAL HERNIA REPAIR, recurrent (N/A) - Provider requesting 3 hours/ 180 minutes for procedure. as a surgical intervention.  The patient's history has been reviewed, patient examined, no change in status, stable for surgery.  I have reviewed the patient's chart and labs.  Questions were answered to the patient's satisfaction.     Quintin Hjort

## 2020-12-14 NOTE — Discharge Instructions (Addendum)
CIRUGIA AMBULATORIA       Instruccionnes de alta    Date (Fecha)    1.  Las drogas que se le administraron permaneceran en su cuerpo hasta [place], asi            que por las proximas 24 horas usted no debe:   Conducir (manejar) un automovil   Hacer ninguna decision legal   Tomar ninguna bebida alcoholica  2.  A) [place] puede comenzar una dieta regular.  Es mejor que hoy empiece con           liquidos y gradualmente anada comidas solidas.       B) Puede comer cualquier comida que desee pero es mejor empezar con liquidos,                      luego sopitas con galletas saladas y gradualmente llegar a las comidas solidas.  3.  Por favor avise a su medico inmediatamente si usted tiene algun sangrado anormal,       tiene dificultad con la respiracion, enrojecimiento y dolor en el sitio de la cirugia, drenaje,       fiebro o dolor que se alivia con medicina.  4.  A) Su visita posoperatoria (despues de su operacion) es con el  Dr.  Date                    Time         B)  Por favor llame para hacer la cita posoperatoria.  5.  Istrucciones especificas :  

## 2020-12-14 NOTE — Op Note (Signed)
  Procedure Date:  12/14/2020  Pre-operative Diagnosis:  Recurrent umbilical hernia  Post-operative Diagnosis:  Recurrent umbilical hernia  Procedure: Robotic assisted Recurrent umbilical Hernia Repair with mesh  Surgeon:  Howie Ill, MD  Assistant:  Stephan Minister, PA-S  Anesthesia:  General endotracheal  Estimated Blood Loss:  10 ml  Specimens:  None  Complications:  NOne  Indications for Procedure:  This is a 85 y.o. male who presents with a recurrent umbilical hernia.  The options of surgery versus observation were reviewed with the patient and/or family. The risks of bleeding, abscess or infection, recurrence of symptoms, potential for an open procedure, injury to surrounding structures, and chronic pain were all discussed with the patient and was willing to proceed.  Description of Procedure: The patient was correctly identified in the preoperative area and brought into the operating room.  The patient was placed supine with VTE prophylaxis in place.  Appropriate time-outs were performed.  Anesthesia was induced and the patient was intubated.  Appropriate antibiotics were infused.  The abdomen was prepped and draped in a sterile fashion. The patient's hernia defect was marked with a marking pen.  A Veress needle was introduced in the left upper quadrant and pneumoperitoneum was obtained with appropriate pressures.  Using optivue technique an 8 mm port was placed in left lateral wall under direct visualization.  Then a 12 mm port was placed in LUQ and an 8 mm port in the LLQ.  Two 2-0 vlock sutures and a 0 stratafix, in addition to a 4.5 inch Bard Ventralight ST Echo mesh were placed via the 12-mm port.  The DaVinci platform was docked, camera targeted, and instruments placed under direct visualization.  The peritoneum and preperitoneal fat surrounding the hernia defect was dissected to allow for better mesh adhesion.   The hernia sac was partially resected as well.  The defect was  then closed with 0 Stratafix suture, tacking down the underside of the umbilicus.  Then the PMI was used to position the mesh and balloon insufflated.  The mesh was then secured using 2-0 vlock suture circumferentially.  The DaVinci platform was then undocked.  50 ml of Exparel solution with 0.25% bupivacaine with epi was infiltrated onto the peritoneum around the mesh and fascia as well as around the port sites.  The 12 mm port was removed and the fascia was closed under direct visualization utilizing an Endo Close technique with 0 Vicryl interrupted sutures.  The 8 mm ports were removed.  The 12 mm port incision was closed with 3-0 Vicryl and 4-0 Monocryl, and the remaining incisions were closed with 4-0 Monocryl.  The wounds were cleaned and sealed with DermaBond.  The patient was emerged from anesthesia and extubated and brought to the recovery room for further management.  The patient tolerated the procedure well and all counts were correct at the end of the case.   Howie Ill, MD

## 2020-12-14 NOTE — Progress Notes (Signed)
Patient able to communicate needs with J Apple RN in spainsh, family given dc instructions in spanish as well

## 2020-12-15 ENCOUNTER — Ambulatory Visit: Payer: Medicaid Other | Admitting: Surgery

## 2020-12-20 NOTE — Anesthesia Postprocedure Evaluation (Signed)
Anesthesia Post Note  Patient: Gerald Hodges  Procedure(s) Performed: XI ROBOT ASSISTED UMBILICAL HERNIA REPAIR, recurrent  Patient location during evaluation: PACU Anesthesia Type: General Level of consciousness: awake and alert Pain management: pain level controlled Vital Signs Assessment: post-procedure vital signs reviewed and stable Respiratory status: spontaneous breathing, nonlabored ventilation, respiratory function stable and patient connected to nasal cannula oxygen Cardiovascular status: blood pressure returned to baseline and stable Postop Assessment: no apparent nausea or vomiting Anesthetic complications: no   No notable events documented.   Last Vitals:  Vitals:   12/14/20 1845 12/14/20 1905  BP: (!) 171/107 (!) 162/96  Pulse: 87 87  Resp:    Temp:    SpO2: 95% 95%    Last Pain:  Vitals:   12/14/20 1905  TempSrc:   PainSc: 2                  Yevette Edwards

## 2020-12-31 ENCOUNTER — Other Ambulatory Visit: Payer: Self-pay

## 2020-12-31 ENCOUNTER — Encounter: Payer: Self-pay | Admitting: Surgery

## 2020-12-31 ENCOUNTER — Ambulatory Visit (INDEPENDENT_AMBULATORY_CARE_PROVIDER_SITE_OTHER): Payer: Medicaid Other | Admitting: Surgery

## 2020-12-31 VITALS — BP 135/89 | HR 89 | Temp 98.0°F | Ht 64.5 in | Wt 184.6 lb

## 2020-12-31 DIAGNOSIS — Z09 Encounter for follow-up examination after completed treatment for conditions other than malignant neoplasm: Secondary | ICD-10-CM

## 2020-12-31 DIAGNOSIS — K429 Umbilical hernia without obstruction or gangrene: Secondary | ICD-10-CM

## 2020-12-31 NOTE — Patient Instructions (Addendum)
puede probar Pepsid o Prilosec para el reflujo que tiene. estos se pueden comprar sin receta. debe llamar a su mdico de [employer] que est teniendo. haga un seguimiento aqu segn sea necesario.  Reparacin laparoscpica de una hernia umbilical, cuidados posteriores Laparoscopic Umbilical Hernia Repair, Care After La siguiente informacin ofrece orientacin sobre cmo cuidarse despus del procedimiento. El mdico tambin podr darle instrucciones ms especficas. Comunquese con el mdico si tiene problemas o preguntas. Qu puedo esperar despus del procedimiento? Despus del procedimiento, es normal tener dolor, molestias o sensibilidad. Siga estas instrucciones en su casa: Medicamentos Use los medicamentos de venta libre y los recetados solamente como se lo haya indicado el mdico. Pregntele al mdico si el medicamento recetado: Hace necesario que evite conducir o usar [place]. Puede causarle estreimiento. Es posible que tenga que tomar estas medidas para prevenir o tratar el estreimiento: Product manager suficiente lquido como para Pharmacologist la orina de color amarillo plido. Usar medicamentos recetados o de Sales promotion account executive. Consumir alimentos ricos en fibra, como frijoles, cereales integrales, y frutas y verduras frescas. Limitar el consumo de alimentos ricos en grasa y azcares procesados, como los alimentos fritos o dulces. Cuidado de la incisin  Siga las instrucciones del mdico acerca del cuidado de la incisin. Asegrese de hacer lo siguiente: Lvese las manos con agua y jabn durante al menos 20 segundos antes y despus de cambiarse la venda (vendaje) o antes de tocarse el abdomen. Use desinfectante para manos si no dispone de [place] y [place]. Cambie el vendaje como se lo haya indicado el mdico. No retire los puntos (suturas), la goma para cerrar la piel o las tiras [place]. Es posible que estos cierres cutneos deban quedar puestos en la piel durante 2 semanas o ms  tiempo. Si los bordes de las tiras [address] empiezan a despegarse y Scientific laboratory technician, puede recortar los que estn sueltos. No retire las tiras [employer] por completo a menos que el mdico se lo indique. Haematologist de la incisin todos los [address]  [address] para detectar signos de infeccin. Est atento a los siguientes signos: Aumento del enrojecimiento, la hinchazn o Chief Technology Officer. Lquido o sangre. Calor. Pus o mal olor. Baarse  No tome baos de inmersin, no nade ni use el jacuzzi hasta que el mdico lo autorice. Pregntele al mdico si puede ducharse. Delle Reining solo le permitan darse baos de [place]. Mantenga seco el vendaje hasta que el mdico le diga que se lo puede quitar. Actividad  Haga reposo como se lo haya indicado el mdico. Evite estar sentado durante largos perodos sin moverse. Levntese y camine un poco cada 1 a 2 horas. Esto es importante para mejorar el flujo sanguneo y la respiracin. Pida ayuda si se siente dbil o inestable. No levante ningn objeto que pese ms de 10 libras (4.5 kg) o que supere el lmite de peso que le hayan indicado, Teacher, adult education que el mdico le diga que puede [place]. Si le administraron un sedante durante el procedimiento, puede afectarlo por varias horas. No conduzca ni opere maquinaria hasta que el mdico le indique que es seguro [place]. Retome sus actividades normales segn lo indicado por el mdico. Pregntele al mdico qu actividades son seguras para usted. Indicaciones generales  Sostenga una almohada sobre el abdomen cuando tosa o estornude. Esto ayuda a Engineer, materials. No consuma ningn producto que contenga nicotina o tabaco. Estos productos incluyen cigarrillos, tabaco para Theatre manager y aparatos de vapeo, como los Administrator, Civil Service. Estos pueden retrasar la cicatrizacin despus de la ciruga.  Si necesita ayuda para dejar de consumir estos productos, consulte al [employer]. Posiblemente le pidan que contine realizando ejercicios de respiracin profunda en  su hogar. Esto ayudar a Radiation protection practitioner. Cumpla con todas las visitas de seguimiento. Esto es importante. Comunquese con un mdico si: Tiene cualquiera de estos signos de infeccin: Ms enrojecimiento, hinchazn o dolor alrededor de una incisin. Sangre o lquido provenientes de una incisin. Calor proveniente de una incisin. Pus o mal olor provenientes de una incisin. Fiebre o escalofros. Tiene un dolor que empeora o que no mejora con los medicamentos. Tiene nuseas o vmitos. Tiene tos. Le falta el aire. No defeca durante 3 das. No puede orinar. Solicite ayuda de inmediato si tiene: Dolor intenso en el abdomen. Nuseas o vmitos persistentes. Enrojecimiento, calor o dolor en la pierna. Dolor de pecho. Dificultad para respirar. Estos sntomas pueden representar un problema grave que constituye Radio broadcast assistant. No espere a ver si los sntomas desaparecen. Solicite atencin mdica de inmediato. Comunquese con el servicio de emergencias de su localidad (911 en los [place]). No conduzca por sus propios medios [employer]. Resumen Despus de este procedimiento, es normal tener dolor, molestias o sensibilidad. Siga las instrucciones del mdico acerca del cuidado de la incisin. Controle la zona de la incisin todos los [address]  [address] para detectar signos de infeccin. Avsele al mdico sobre cualquier signo de infeccin. Cumpla con todas las visitas de seguimiento. Esto es importante. Esta informacin no tiene Theme park manager el consejo del mdico. Asegrese de hacerle al mdico cualquier pregunta que tenga. Document Revised: 12/25/2019 Document Reviewed: 12/25/2019 Elsevier Patient Education  2022 [employer].

## 2020-12-31 NOTE — Progress Notes (Signed)
12/31/2020  HPI: Gerald Hodges is a 85 y.o. male s/p robotic assisted recurrent umbilical hernia repair on 12/14/2020.  Patient presents today for follow-up.  Patient reports that he has been having a few issues.  He reports that he has been feeling some intermittent stabbing-like sensation around the incisions.  These are very short-lived and mild in nature.  He has also been feeling a small lump above the umbilicus.  This is nontender.  He also reports of feeling a burning or discomfort sensation is going from the upper abdomen up towards his neck.  Denies any chest pain but also reports feeling some dizziness particularly in the morning when he gets up from bed.  Denies any issues with the incisions themselves particularly denies any erythema, drainage, or wound breakdown.  Vital signs: BP 135/89   Pulse 89   Temp 98 F (36.7 C)   Wt 184 lb 9.6 oz (83.7 kg)   SpO2 98%   BMI 30.72 kg/m    Physical Exam: Constitutional: No acute distress Abdomen: Soft, nondistended, currently nontender to palpation.  The patient's incisions are healing well and are clean, dry, intact.  At the umbilical area, there is a small lump above the umbilicus which is consistent with scar tissue from the hernia repair.  On exam, there is no evidence of hernia recurrence.  Assessment/Plan: This is a 85 y.o. male s/p robotic assisted recurrent umbilical hernia repair.  - Discussed with the patient that the pain sensations that he is having is very normal with the scarring process as some of the scar tissue forming could be tugging on him depending how she moves.  This should keep improving slowly with time.  The bump that he feels above the umbilicus is also related to scar tissue and should be easing up or improving with time as well.  There is no evidence of hernia recurrence. --Discussed with patient also that the discomfort traveling from the epigastric area up his chest could be potentially acid reflux and  that he could try antacid such as Pepcid or Prilosec.  However also adding the symptom of dizziness in the mornings could be related to his heart and given his coronary artery disease, it may be worthwhile to get in touch with his cardiologist to be evaluated for this. - His incisions are healing well and there is no evidence of any complications at this point. - Follow-up as needed.   Howie Ill, MD [facility]

## 2021-01-03 ENCOUNTER — Telehealth: Payer: Self-pay | Admitting: Physician Assistant

## 2021-01-03 NOTE — Telephone Encounter (Signed)
done

## 2021-01-03 NOTE — Telephone Encounter (Signed)
STAT if patient feels like he/she is going to faint   Are you dizzy now? Unknown son not with patient   Do you feel faint or have you passed out? Denies LOC  Do you have any other symptoms? Just doesn't feel good since last week   Have you checked your HR and BP (record if available)? Not available   Patient son wants asap appt to eval for pre syncope  Patient scheduled with Alycia Rossetti on 11/1 added to waitlist

## 2021-01-03 NOTE — Telephone Encounter (Signed)
Spoke with patients son per release form and inquired about how we can help. He states that patient had hernia surgery and since then he has had some dizziness. Son called surgeon who advised to call us. Advised that he can also contact his primary care provider as well. Confirmed upcoming appointment and he verbalized understanding with no further questions.

## 2021-01-08 NOTE — Progress Notes (Signed)
Cardiology Office Note    Date:  01/11/2021   ID:  Gerald Hodges, DOB 1935-04-21, MRN [id number]  PCP:  Marjie Skiff, NP  Cardiologist:  Yvonne Kendall, MD  Electrophysiologist:  None   Chief Complaint: Dizziness  History of Present Illness:   Gerald Hodges is a 85 y.o. male with history of CAD based on abnormal myocardial perfusion stress test at [facility] with chronic stable angina, TIA, CKD stage IIIb, HTN, HLD, dizziness with possible vertigo, and GERD who presents for evaluation of dizziness.   Prior Myoview in 10/2016 at [facility] was without significant ischemia with a small in size, subtle in severity, fixed mid inferolateral and basal defect consistent with possible artifact but could not rule out subtle scar, EF 56%, no significant coronary artery calcification. Study was low risk and unchanged when compared to prior in 09/2012. Echo from 11/2017 showed an EF of 55-60%, mild LVH, moderate focal basal hypertrophy, Gr1DD, mild aortic valve thickening with mild regurgitation, normal RV size and function. When he was seen in 11/2017, he noted some left leg pain/paresthesias and underwent lower extremity ABIs in 11/2017 that were normal bilaterally.  He was evaluated in 10/2018 at the [facility] ED with COVID-19 pneumonia.  He has been noted to have stable chronic exertional dyspnea over the years.  Amlodipine has previously been decreased due to positional dizziness and lower extremity swelling.    He was  evaluated for preoperative cardiac risk stratification of hernia repair in 11/2019, with recommendation to proceed with Lexiscan MPI to evaluate for high risk ischemia give symptoms.  Subsequent Lexiscan MPI in 12/2019 showed no significant ischemia with a small to moderate-sized region of predominantly fixed defect in the inferolateral wall that was challenging to interpret in the setting of GI uptake artifact, EF 42%.  CT attenuation corrected images with very mild aortic  atherosclerosis and no notable coronary artery calcification.  Overall, this was a low risk study.  Echo in 12/2019 showed an EF of 55-60%, no RWMA, mild LVH, Gr2DD, normal RV systolic function and ventricular cavity size, mild MR, and mild AI.  Based on these studies, he was felt to be acceptable risk for surgery.    He was seen in 05/2020, noting body aches and fatigue, as well as worsening exertional dyspnea and chest pain.  He had been taking Imdur 30 mg, rather than the previously recommended 90 mg.  He preferred to reinitiate Imdur at 90 mg initially, and reserve LHC for refractory symptoms.  With titration of Imdur, symptoms improved.    He was last seen in the office in 09/2020 for repeat cardiac restratification for hernia repair.  He was doing well from a cardiac perspective and felt to be moderate risk for noncardiac surgery without further testing indicated.  His surgery was delayed while he was evaluated for positive QuantiFERON gold.  He underwent robotic assisted umbilical hernia repair on 12/14/2020.  At his post surgical follow-up with his surgeon on 12/31/2020, he reported intermittent stabbing sensations along his incisions, a small lump above the umbilicus, burning/discomfort going from the upper abdomen to his chest without frank chest pain, as well as dizziness upon getting up in the morning.  There was no evidence of hernia recurrence.  He was advised to try Pepcid or Prilosec for some of his discomfort and follow-up with our office for dizziness.  He comes in doing reasonably well from a cardiac perspective.  Since initiating Pepcid, his epigastric discomfort has resolved.  He has  been without chest pain or dyspnea.  He does note both positional dizziness and a room spinning sensation.  Since he was last seen he did have 1 mechanical fall in the morning when he got out of bed with noted bilateral lower extremity weakness.  He did not hit his head or suffer LOC.  No falls since.  His  positional dizziness is typically worse in the mornings prior to eating or drinking anything.  As he drinks fluids throughout the day this dizziness improves.  He does continue to note a room spinning sensation intermittently along with a fullness in his ears.  With his dizziness he reports some palpitations.  No frank angina.  He has not had any syncope.  He is taking both atorvastatin and rosuvastatin.   Labs independently reviewed: 12/2020 - potassium 3.9, BUN 28, serum creatinine 1.37, albumin 4.2, AST/ALT normal, Hgb 14.7, PLT 246 09/2020 - TSH 5.59, free T4 normal 07/2020 - TC 158, TG 181, HDL 35, LDL 92 (off statin), magnesium 1.7  Past Medical History:  Diagnosis Date   (HFpEF) heart failure with preserved ejection fraction (HCC)    a.) MPI 12/12/2019 --> EF 42%. b.) TTE 12/17/2019 --> EF 55-60%.   Abdominal aortic atherosclerosis (HCC)    a.) MPI 12/12/2019--> very mild.   Anginal pain (HCC)    Chronic anticoagulation    a.) clopidogrel   CKD (chronic kidney disease), stage III (HCC)    Coronary artery disease    Diastolic dysfunction    a.) TTE 12/17/2019 --> LVEF 55-60%; G2DD.   GERD (gastroesophageal reflux disease)    Gout    History of 2019 novel coronavirus disease (COVID-19) 09/10/2020   Hyperlipidemia    Hypertension    Hypothyroidism    Osteoarthritis of left knee    Psoriasis    Recurrent umbilical hernia    a.) repaired 01/01/2020; recurred.   SNHL (sensory-neural hearing loss), asymmetrical    TIA (transient ischemic attack)    Valvular regurgitation    a.) TTE 12/17/2019 --> EF 55-60%; mild AR, TR    Past Surgical History:  Procedure Laterality Date   BUNIONECTOMY Bilateral    CATARACT EXTRACTION W/ INTRAOCULAR LENS  IMPLANT, BILATERAL Bilateral    TOTAL KNEE ARTHROPLASTY Left 10/25/2016   Procedure: TOTAL KNEE ARTHROPLASTY;  Surgeon: Deeann Saint, MD;  Location: [facility] ORS;  Service: Orthopedics;  Laterality: Left;   UMBILICAL HERNIA REPAIR N/A  01/01/2020   Procedure: HERNIA REPAIR UMBILICAL ADULT;  Surgeon: Henrene Dodge, MD;  Location: [facility] ORS;  Service: General;  Laterality: N/A;   XI ROBOTIC ASSISTED INGUINAL HERNIA REPAIR WITH MESH Left 01/01/2020   Procedure: XI ROBOTIC ASSISTED INGUINAL HERNIA REPAIR WITH MESH;  Surgeon: Henrene Dodge, MD;  Location: [facility] ORS;  Service: General;  Laterality: Left;    Current Medications: Current Meds  Medication Sig   acetaminophen (TYLENOL) 500 MG tablet Take 2 tablets (1,000 mg total) by mouth every 6 (six) hours as needed for mild pain.   allopurinol (ZYLOPRIM) 100 MG tablet TAKE 1 TABLET(100 MG) BY MOUTH DAILY   amLODipine (NORVASC) 5 MG tablet Take 1 tablet (5 mg total) by mouth daily.   carvedilol (COREG) 6.25 MG tablet TAKE 1 TABLET(6.25 MG) BY MOUTH TWICE DAILY WITH A MEAL   clobetasol ointment (TEMOVATE) 0.05 % Apply 1 application topically daily.   clopidogrel (PLAVIX) 75 MG tablet Take 1 tablet (75 mg total) by mouth daily.   furosemide (LASIX) 40 MG tablet Take 1 tablet (40 mg total) by  mouth daily as needed for edema (weight gain, shortness of breath).   levothyroxine (SYNTHROID) 50 MCG tablet Take 1 tablet (50 mcg total) by mouth daily.   losartan (COZAAR) 50 MG tablet Take 1 tablet (50 mg total) by mouth daily.   meclizine (ANTIVERT) 12.5 MG tablet Take 1 tablet (12.5 mg total) by mouth 2 (two) times daily as needed for dizziness.   nitroGLYCERIN (NITROSTAT) 0.4 MG SL tablet Place 1 tablet (0.4 mg total) under the tongue every 5 (five) minutes as needed for chest pain (chest pain). Maximum of 3 doses.   pantoprazole (PROTONIX) 40 MG tablet Take 1 tablet (40 mg total) by mouth daily.   rosuvastatin (CRESTOR) 10 MG tablet Take 10 MG (one tablet) by mouth 3 days a week (Monday, Wednesday, Friday).   [DISCONTINUED] atorvastatin (LIPITOR) 20 MG tablet Take 20 mg by mouth daily.    Allergies:   Aspirin and Lisinopril   Social History   Socioeconomic History   Marital status:  Married    Spouse name: Not on file   Number of children: Not on file   Years of education: Not on file   Highest education level: Not on file  Occupational History   Not on file  Tobacco Use   Smoking status: Never   Smokeless tobacco: Never  Vaping Use   Vaping Use: Never used  Substance and Sexual Activity   Alcohol use: No   Drug use: No   Sexual activity: Not Currently  Other Topics Concern   Not on file  Social History Narrative   Not on file   Social Determinants of Health   Financial Resource Strain: Not on file  Food Insecurity: Not on file  Transportation Needs: Not on file  Physical Activity: Not on file  Stress: Not on file  Social Connections: Not on file     Family History:  The patient's family history includes Cancer in his mother. There is no history of Heart disease.  ROS:   Review of Systems  Constitutional:  Positive for malaise/fatigue. Negative for chills, diaphoresis, fever and weight loss.  HENT:  Negative for congestion.   Eyes:  Negative for discharge and redness.  Respiratory:  Negative for cough, sputum production, shortness of breath and wheezing.   Cardiovascular:  Negative for chest pain, palpitations, orthopnea, claudication, leg swelling and PND.  Gastrointestinal:  Positive for heartburn. Negative for abdominal pain, blood in stool, melena, nausea and vomiting.  Musculoskeletal:  Positive for falls. Negative for myalgias.  Skin:  Negative for rash.  Neurological:  Positive for dizziness and weakness. Negative for tingling, tremors, sensory change, speech change, focal weakness and loss of consciousness.       Positional dizziness and room spinning sensation  Endo/Heme/Allergies:  Does not bruise/bleed easily.  Psychiatric/Behavioral:  Negative for substance abuse. The patient is not nervous/anxious.   All other systems reviewed and are negative.   EKGs/Labs/Other Studies Reviewed:    Studies reviewed were summarized above. The  additional studies were reviewed today:  2D echo 12/17/2019: 1. Left ventricular ejection fraction, by estimation, is 55 to 60%. The  left ventricle has normal function. The left ventricle has no regional  wall motion abnormalities. There is mild left ventricular hypertrophy.  Left ventricular diastolic parameters  are consistent with Grade II diastolic dysfunction (pseudonormalization).   2. Right ventricular systolic function is normal. The right ventricular  size is normal.   3. The mitral valve is normal in structure. Mild mitral valve  regurgitation.  4. The aortic valve is tricuspid. Aortic valve regurgitation is mild.   5. The inferior vena cava is dilated in size with >50% respiratory  variability, suggesting right atrial pressure of 8 mmHg. __________   Eugenie Birks MPI 12/12/2019: Pharmacological myocardial perfusion imaging study with no significant  Ischemia Small to moderate-sized region of predominantly fixed defect in the inferolateral wall , challenging to interpret in the setting of GI uptake artifact Normal wall motion, EF estimated at 42% (possibly depressed secondary to artifact) No EKG changes concerning for ischemia at peak stress or in recovery. CT attenuation correction images with very mild aortic atherosclerosis, no notable coronary calcification Low risk scan   EKG:  EKG is ordered today.  The EKG ordered today demonstrates NSR, 90 bpm, first-degree AV block, occasional PVCs, baseline wandering along the anteroseptal leads, possible prior inferior infarct, no acute ST-T changes  Recent Labs: 01/27/2020: BNP 37.5 07/27/2020: Magnesium 1.7 09/10/2020: TSH 5.590 12/13/2020: ALT 15; Platelets 246 12/14/2020: BUN 25; Creatinine, Ser 1.30; Hemoglobin 16.3; Potassium 3.6; Sodium 140  Recent Lipid Panel    Component Value Date/Time   CHOL 158 07/27/2020 0955   TRIG 181 (H) 07/27/2020 0955   HDL 35 (L) 07/27/2020 0955   CHOLHDL 3.3 05/26/2020 1001   CHOLHDL 2.7  04/11/2019 0934   VLDL 25 04/11/2019 0934   LDLCALC 92 07/27/2020 0955   LDLDIRECT UNABLETO PERFORMED DUE TO NORMAL TRIG 04/11/2019 0934    PHYSICAL EXAM:    VS:  BP 128/88 (BP Location: Left Arm, Patient Position: Sitting, Cuff Size: Large)   Wt 185 lb (83.9 kg)   SpO2 97%   BMI 31.26 kg/m   BMI: Body mass index is 31.26 kg/m.  Physical Exam Vitals reviewed.  Constitutional:      Appearance: He is well-developed.  HENT:     Head: Normocephalic and atraumatic.  Eyes:     General:        Right eye: No discharge.        Left eye: No discharge.  Neck:     Vascular: No JVD.  Cardiovascular:     Rate and Rhythm: Normal rate and regular rhythm.     Pulses:          Posterior tibial pulses are 2+ on the right side and 2+ on the left side.     Heart sounds: Normal heart sounds, S1 normal and S2 normal. Heart sounds not distant. No midsystolic click and no opening snap. No murmur heard.   No friction rub.  Pulmonary:     Effort: Pulmonary effort is normal. No respiratory distress.     Breath sounds: Normal breath sounds. No decreased breath sounds, wheezing or rales.  Chest:     Chest wall: No tenderness.  Abdominal:     General: There is no distension.     Palpations: Abdomen is soft.     Tenderness: There is no abdominal tenderness.  Musculoskeletal:     Cervical back: Normal range of motion.     Right lower leg: No edema.     Left lower leg: No edema.  Skin:    General: Skin is warm and dry.     Nails: There is no clubbing.  Neurological:     Mental Status: He is alert and oriented to person, place, and time.  Psychiatric:        Speech: Speech normal.        Behavior: Behavior normal.        Thought Content: Thought  content normal.        Judgment: Judgment normal.    Wt Readings from Last 3 Encounters:  01/11/21 185 lb (83.9 kg)  12/31/20 184 lb 9.6 oz (83.7 kg)  12/10/20 180 lb (81.6 kg)     Orthostatic vital signs: Lying: 150/96, 90 bpm Sitting: 130/83,  89 bpm Standing: 119/83, 96 bpm Standing x 3 minutes: 135/89, 102 bpm  ASSESSMENT & PLAN:   Dizziness: He was noted to have mildly positive orthostatic vital signs in the office today when transitioning from lying to sitting with a 20 mmHg systolic drop and a 13 mmHg diastolic drop.  Symptoms are also consistent with vertigo with a room spinning sensation noted.  Recommend he increase water intake.  Trial of meclizine 12.5 mg twice daily as needed.  Place Zio patch.  Schedule echo.  He has been advised to follow-up with his PCP as well.  CAD involving the native coronary arteries without angina: No symptoms concerning for angina.  He notes his epigastric discomfort has been resolved with the addition of Pepcid.  Recent Lexiscan MPI showed no evidence of ischemia.  Continue current medical therapy including clopidogrel, amlodipine, atorvastatin, carvedilol, isosorbide mononitrate, and rosuvastatin.  HFpEF: He appears euvolemic and well compensated.  He remains on Lasix 40 mg as needed.  HTN: Blood pressure is well controlled in the office today. continue current medical therapy.  HLD: LDL 92 in 07/2020, having been off Lipitor since 05/2020.  Previously, atorvastatin was discontinued due to myalgias.  However, he self resumed atorvastatin with no noted myalgias, arthralgias, or significant fatigue.  He comes in today taking both atorvastatin and rosuvastatin.  He was instructed to discontinue atorvastatin and continue rosuvastatin only.  This change was indicated on his pill bottles as well.  CKD stage IIIb: Renal function stable on last check.  TIA: No new symptoms.  He remains on clopidogrel as outlined above.  Language barrier: Hospital interpreter utilized for today's visit.  Disposition: F/u with Dr. Okey Dupre or an APP in 3 months.   Medication Adjustments/Labs and Tests Ordered: Current medicines are reviewed at length with the patient today.  Concerns regarding medicines are outlined above.  Medication changes, Labs and Tests ordered today are summarized above and listed in the Patient Instructions accessible in Encounters.   Signed, Eula Listen, PA-C 01/11/2021 10:04 AM     [facility] [place] [address] [place], [place] [address] [phone]

## 2021-01-11 ENCOUNTER — Ambulatory Visit (INDEPENDENT_AMBULATORY_CARE_PROVIDER_SITE_OTHER): Payer: Medicaid Other

## 2021-01-11 ENCOUNTER — Ambulatory Visit: Payer: Medicaid Other | Admitting: Physician Assistant

## 2021-01-11 ENCOUNTER — Encounter: Payer: Self-pay | Admitting: Physician Assistant

## 2021-01-11 ENCOUNTER — Other Ambulatory Visit: Payer: Self-pay

## 2021-01-11 VITALS — BP 128/88 | Wt 185.0 lb

## 2021-01-11 DIAGNOSIS — I2510 Atherosclerotic heart disease of native coronary artery without angina pectoris: Secondary | ICD-10-CM | POA: Diagnosis not present

## 2021-01-11 DIAGNOSIS — E785 Hyperlipidemia, unspecified: Secondary | ICD-10-CM

## 2021-01-11 DIAGNOSIS — I10 Essential (primary) hypertension: Secondary | ICD-10-CM

## 2021-01-11 DIAGNOSIS — I5032 Chronic diastolic (congestive) heart failure: Secondary | ICD-10-CM | POA: Diagnosis not present

## 2021-01-11 DIAGNOSIS — G459 Transient cerebral ischemic attack, unspecified: Secondary | ICD-10-CM

## 2021-01-11 DIAGNOSIS — R42 Dizziness and giddiness: Secondary | ICD-10-CM

## 2021-01-11 DIAGNOSIS — R0609 Other forms of dyspnea: Secondary | ICD-10-CM

## 2021-01-11 DIAGNOSIS — Z789 Other specified health status: Secondary | ICD-10-CM | POA: Diagnosis not present

## 2021-01-11 DIAGNOSIS — N1832 Chronic kidney disease, stage 3b: Secondary | ICD-10-CM | POA: Diagnosis not present

## 2021-01-11 MED ORDER — MECLIZINE HCL 12.5 MG PO TABS: 12.5000 mg | ORAL_TABLET | Freq: Two times a day (BID) | ORAL | 0 refills | Status: AC | PRN

## 2021-01-11 NOTE — Patient Instructions (Addendum)
Medication Instructions:  Your physician has recommended you make the following change in your medication:   START Meclizine 12.5 mg twice a day as needed for dizziness STOP Lipitor  *If you need a refill on your cardiac medications before your next appointment, please call your pharmacy*   Lab Work: None  If you have labs (blood work) drawn today and your tests are completely normal, you will receive your results only by: MyChart Message (if you have MyChart) OR A paper copy in the mail If you have any lab test that is abnormal or we need to change your treatment, we will call you to review the results.   Testing/Procedures: Your physician has requested that you have an echocardiogram. Echocardiography is a painless test that uses sound waves to create images of your heart. It provides your doctor with information about the size and shape of your heart and how well your heart's chambers and valves are working. This procedure takes approximately one hour. There are no restrictions for this procedure.  Your physician has recommended that you wear a Zio monitor. On January 25, 2021 remove monitor and return in box provided.   This monitor is a medical device that records the heart's electrical activity. Doctors most often use these monitors to diagnose arrhythmias. Arrhythmias are problems with the speed or rhythm of the heartbeat. The monitor is a small device applied to your chest. You can wear one while you do your normal daily activities. While wearing this monitor if you have any symptoms to push the button and record what you felt. Once you have worn this monitor for the period of time provider prescribed (Usually 14 days), you will return the monitor device in the postage paid box. Once it is returned they will download the data collected and provide [place] with a report which the provider will then review and we will call you with those results. Important tips:  Avoid showering during the  first 24 hours of wearing the monitor. Avoid excessive sweating to help maximize wear time. Do not submerge the device, no hot tubs, and no swimming pools. Keep any lotions or oils away from the patch. After 24 hours you may shower with the patch on. Take brief showers with your back facing the shower head.  Do not remove patch once it has been placed because that will interrupt data and decrease adhesive wear time. Push the button when you have any symptoms and write down what you were feeling. Once you have completed wearing your monitor, remove and place into box which has postage paid and place in your outgoing mailbox.  If for some reason you have misplaced your box then call our office and we can provide another box and/or mail it off for you.      Follow-Up: At [facility], you and your health needs are our priority.  As part of our continuing mission to provide you with exceptional heart care, we have created designated Provider Care Teams.  These Care Teams include your primary Cardiologist (physician) and Advanced Practice Providers (APPs -  Physician Assistants and Nurse Practitioners) who all work together to provide you with the care you need, when you need it.  We recommend signing up for the patient portal called "MyChart".  Sign up information is provided on this After Visit Summary.  MyChart is used to connect with patients for Virtual Visits (Telemedicine).  Patients are able to view lab/test results, encounter notes, upcoming appointments, etc.  Non-urgent messages  can be sent to your provider as well.   To learn more about what you can do with MyChart, go to [email or web address].    Your next appointment:   3 month(s)  The format for your next appointment:   In Person  Provider:   Dr. Cristal Deer End or Eula Listen PA-C

## 2021-01-22 ENCOUNTER — Encounter: Payer: Self-pay | Admitting: Nurse Practitioner

## 2021-01-27 ENCOUNTER — Encounter: Payer: Self-pay | Admitting: Nurse Practitioner

## 2021-01-27 ENCOUNTER — Other Ambulatory Visit: Payer: Self-pay

## 2021-01-27 ENCOUNTER — Ambulatory Visit (INDEPENDENT_AMBULATORY_CARE_PROVIDER_SITE_OTHER): Payer: Medicaid Other | Admitting: Nurse Practitioner

## 2021-01-27 VITALS — BP 134/88 | HR 72 | Temp 97.9°F | Wt 187.2 lb

## 2021-01-27 DIAGNOSIS — I2510 Atherosclerotic heart disease of native coronary artery without angina pectoris: Secondary | ICD-10-CM

## 2021-01-27 DIAGNOSIS — Z227 Latent tuberculosis: Secondary | ICD-10-CM | POA: Diagnosis not present

## 2021-01-27 DIAGNOSIS — E039 Hypothyroidism, unspecified: Secondary | ICD-10-CM | POA: Diagnosis not present

## 2021-01-27 DIAGNOSIS — I208 Other forms of angina pectoris: Secondary | ICD-10-CM | POA: Diagnosis not present

## 2021-01-27 DIAGNOSIS — N1832 Chronic kidney disease, stage 3b: Secondary | ICD-10-CM

## 2021-01-27 DIAGNOSIS — E785 Hyperlipidemia, unspecified: Secondary | ICD-10-CM

## 2021-01-27 DIAGNOSIS — I10 Essential (primary) hypertension: Secondary | ICD-10-CM | POA: Diagnosis not present

## 2021-01-27 DIAGNOSIS — I2089 Other forms of angina pectoris: Secondary | ICD-10-CM

## 2021-01-27 DIAGNOSIS — K219 Gastro-esophageal reflux disease without esophagitis: Secondary | ICD-10-CM | POA: Diagnosis not present

## 2021-01-27 DIAGNOSIS — R7301 Impaired fasting glucose: Secondary | ICD-10-CM

## 2021-01-27 DIAGNOSIS — Z23 Encounter for immunization: Secondary | ICD-10-CM | POA: Diagnosis not present

## 2021-01-27 DIAGNOSIS — I5032 Chronic diastolic (congestive) heart failure: Secondary | ICD-10-CM | POA: Diagnosis not present

## 2021-01-27 LAB — MICROALBUMIN, URINE WAIVED
Creatinine, Urine Waived: 100 mg/dL (ref 10–300)
Microalb, Ur Waived: 30 mg/L — ABNORMAL HIGH (ref 0–19)
Microalb/Creat Ratio: <30 mg/g (ref <30)

## 2021-01-27 LAB — BAYER DCA HB A1C WAIVED: HB A1C (BAYER DCA - WAIVED): 5.9 % — ABNORMAL HIGH (ref 4.8–5.6)

## 2021-01-27 MED ORDER — LOSARTAN POTASSIUM 50 MG PO TABS
50.0000 mg | ORAL_TABLET | Freq: Every day | ORAL | 4 refills | Status: DC
Start: 2021-01-27 — End: 2021-08-11

## 2021-01-27 MED ORDER — ISOSORBIDE MONONITRATE ER 60 MG PO TB24: 90.0000 mg | ORAL_TABLET | Freq: Every day | ORAL | 1 refills | Status: DC

## 2021-01-27 MED ORDER — CARVEDILOL 6.25 MG PO TABS: ORAL_TABLET | ORAL | 3 refills | Status: DC

## 2021-01-27 NOTE — Assessment & Plan Note (Signed)
Chronic, ongoing with use of Protonix, would benefit reduction of this in future.  At this time continue current medication regimen and adjust as needed.  Mag level up to date.

## 2021-01-27 NOTE — Assessment & Plan Note (Signed)
Chronic, ongoing, remains stable on recent labs with cardiology.  Continue to monitor and consider referral to nephrology if any worsening.

## 2021-01-27 NOTE — Assessment & Plan Note (Signed)
Overall clear at this time, continue to monitor.

## 2021-01-27 NOTE — Assessment & Plan Note (Signed)
Chronic, ongoing. Continue Rosuvastatin 3 days a week as is tolerating.  Lipid panel today.

## 2021-01-27 NOTE — Progress Notes (Signed)
BP 134/88   Pulse 72   Temp 97.9 F (36.6 C) (Oral)   Wt 187 lb 3.2 oz (84.9 kg)   SpO2 95%   BMI 31.64 kg/m    Subjective:    Patient ID: Gerald Hodges, male    DOB: 02-21-1936, 85 y.o.   MRN: [id number]  HPI: Gerald Hodges is a 85 y.o. male  Chief Complaint  Patient presents with   Hyperlipidemia   Hypertension   Gastroesophageal Reflux   Gout   Medication Refill    Patient son is requesting a refill on patient Losartan prescription.    Son at bedside to assist with patient HPI.  Was diagnosed with latent TB at health department, with no further treatments needed.  No more coughing, overall improved.  HYPERTENSION / HYPERLIPIDEMIA Continues on Carvedilol, Lasix, Plavix, Losartan, Imdur, Amlodipine, and NTG as needed.  Followed by cardiology and last seen 01/11/21 he had a Zio done which family has to mail back + started Meclizine due to orthostatic dizziness noted + to take Rosuvastatin only.     Last echo noted EF 55-60% in October 2021 -- he ordered to have new one upcoming.   Satisfied with current treatment? yes Duration of hypertension: chronic BP monitoring frequency: not checking BP range:  BP medication side effects: no Duration of hyperlipidemia: chronic Cholesterol medication side effects: no Cholesterol supplements: none Medication compliance: good compliance Aspirin: no Recent stressors: no Recurrent headaches: no Visual changes: no Palpitations: no Dyspnea: occasionally when it is hot Chest pain: no Lower extremity edema:  occasional  later in the day Dizzy/lightheaded: no   CHRONIC KIDNEY DISEASE Takes Allopurinol for gout flares in past, none recently. CKD status: stable Medications renally dose: yes Previous renal evaluation: no Pneumovax:  Up to Date Influenza Vaccine:  Up to Date   HYPOTHYROIDISM Continues on Levothyroxine 50 MCG daily, increased recent visit due to mild elevation levels.   Thyroid control  status:stable Satisfied with current treatment? yes Medication side effects: no Medication compliance: good compliance Etiology of hypothyroidism:  Recent dose adjustment:no Fatigue: no Cold intolerance: no Heat intolerance: no Weight gain: no Weight loss: no Constipation: no Diarrhea/loose stools: no Palpitations: no Lower extremity edema: no Anxiety/depressed mood: no   GERD Taking Protonix 40 MG daily.  GERD control status: stable  Satisfied with current treatment? yes Heartburn frequency: none Medication side effects: no  Medication compliance: stable Dysphagia: no Odynophagia:  no Hematemesis: no Blood in stool: no EGD: yes  Relevant past medical, surgical, family and social history reviewed and updated as indicated. Interim medical history since our last visit reviewed. Allergies and medications reviewed and updated.  Review of Systems  Constitutional:  Negative for activity change, diaphoresis, fatigue and fever.  Respiratory:  Negative for cough, chest tightness, shortness of breath and wheezing.   Cardiovascular:  Negative for chest pain, palpitations and leg swelling.  Gastrointestinal: Negative.   Endocrine: Negative for cold intolerance, heat intolerance, polydipsia, polyphagia and polyuria.  Musculoskeletal: Negative.   Skin: Negative.   Neurological: Negative.   Psychiatric/Behavioral: Negative.     Per HPI unless specifically indicated above     Objective:    BP 134/88   Pulse 72   Temp 97.9 F (36.6 C) (Oral)   Wt 187 lb 3.2 oz (84.9 kg)   SpO2 95%   BMI 31.64 kg/m   Wt Readings from Last 3 Encounters:  01/27/21 187 lb 3.2 oz (84.9 kg)  01/11/21 185 lb (83.9 kg)  12/31/20  184 lb 9.6 oz (83.7 kg)    Physical Exam Vitals and nursing note reviewed.  Constitutional:      General: He is awake. He is not in acute distress.    Appearance: He is well-developed and well-groomed. He is obese. He is not ill-appearing or toxic-appearing.  HENT:      Head: Normocephalic and atraumatic.     Right Ear: Hearing normal. No drainage.     Left Ear: Hearing normal. No drainage.  Eyes:     General: Lids are normal.        Right eye: No discharge.        Left eye: No discharge.     Conjunctiva/sclera: Conjunctivae normal.     Pupils: Pupils are equal, round, and reactive to light.  Neck:     Thyroid: No thyromegaly.     Vascular: No carotid bruit.     Trachea: Trachea normal.  Cardiovascular:     Rate and Rhythm: Normal rate and regular rhythm.     Heart sounds: Normal heart sounds, S1 normal and S2 normal. No murmur heard.   No gallop.  Pulmonary:     Effort: Pulmonary effort is normal. No accessory muscle usage or respiratory distress.     Breath sounds: Normal breath sounds.  Abdominal:     General: Bowel sounds are normal.     Palpations: Abdomen is soft. There is no hepatomegaly or splenomegaly.  Musculoskeletal:        General: Normal range of motion.     Cervical back: Normal range of motion and neck supple.     Right lower leg: No edema.     Left lower leg: No edema.  Lymphadenopathy:     Cervical: No cervical adenopathy.  Skin:    General: Skin is warm and dry.     Capillary Refill: Capillary refill takes less than 2 seconds.  Neurological:     Mental Status: He is alert and oriented to person, place, and time.     Deep Tendon Reflexes: Reflexes are normal and symmetric.     Reflex Scores:      Brachioradialis reflexes are 2+ on the right side and 2+ on the left side.      Patellar reflexes are 2+ on the right side and 2+ on the left side. Psychiatric:        Attention and Perception: Attention normal.        Mood and Affect: Mood normal.        Speech: Speech normal.        Behavior: Behavior normal. Behavior is cooperative.        Thought Content: Thought content normal.   Results for orders placed or performed during the hospital encounter of 12/14/20  I-STAT, chem 8  Result Value Ref Range   Sodium 140  135 - 145 mmol/L   Potassium 3.6 3.5 - 5.1 mmol/L   Chloride 101 98 - 111 mmol/L   BUN 25 (H) 8 - 23 mg/dL   Creatinine, Ser 1.30 (H) 0.61 - 1.24 mg/dL   Glucose, Bld 98 70 - 99 mg/dL   Calcium, Ion 1.21 1.15 - 1.40 mmol/L   TCO2 27 22 - 32 mmol/L   Hemoglobin 16.3 13.0 - 17.0 g/dL   HCT 48.0 39.0 - 52.0 %      Assessment & Plan:   Problem List Items Addressed This Visit       Cardiovascular and Mediastinum   (HFpEF) heart failure with preserved ejection  fraction ([facility]) - Primary    Chronic, ongoing, followed by cardiology.  Recent EF 55-60%, repeat echo ordered by cardiology.  At this time continue current medication regimen as prescribed by cardiology, refills sent on some.  Recommend: - Reminded to call for an overnight weight gain of >2 pounds or a weekly weight gain of >5 pounds - not adding salt to food and read food labels. Reviewed the importance of keeping daily sodium intake to 2000mg  daily. - Avoid NSAIDs      Relevant Medications   losartan (COZAAR) 50 MG tablet   carvedilol (COREG) 6.25 MG tablet   isosorbide mononitrate (IMDUR) 60 MG 24 hr tablet   CAD (coronary artery disease)    Chronic, ongoing, followed by cardiology.  Continue this collaboration and current medication regiment as prescribed by them.  Recent note reviewed.      Relevant Medications   losartan (COZAAR) 50 MG tablet   carvedilol (COREG) 6.25 MG tablet   isosorbide mononitrate (IMDUR) 60 MG 24 hr tablet   Essential hypertension    Chronic, ongoing with BP at goal for age today.  Recommend he monitor BP at least a few mornings a week at home and document.  DASH diet at home.  Continue current medication regimen and adjust as needed.  Labs today: CBC, CMP, urine ALB.  Return in 6 months.       Relevant Medications   losartan (COZAAR) 50 MG tablet   carvedilol (COREG) 6.25 MG tablet   isosorbide mononitrate (IMDUR) 60 MG 24 hr tablet   Other Relevant Orders   CBC with Differential/Platelet    Comprehensive metabolic panel   Stable angina (HCC)    Chronic, ongoing, followed by cardiology.  Continue this collaboration and current medication regiment as prescribed by them.  Recent note reviewed.      Relevant Medications   losartan (COZAAR) 50 MG tablet   carvedilol (COREG) 6.25 MG tablet   isosorbide mononitrate (IMDUR) 60 MG 24 hr tablet     Respiratory   TB lung, latent    Overall clear at this time, continue to monitor.        Digestive   GERD (gastroesophageal reflux disease)    Chronic, ongoing with use of Protonix, would benefit reduction of this in future.  At this time continue current medication regimen and adjust as needed.  Mag level up to date.        Endocrine   Hypothyroidism    Chronic, ongoing.  Continue current medication regimen and adjust as needed.  Repeat TSH and Free T4 today -- discussed with his son.         Relevant Medications   carvedilol (COREG) 6.25 MG tablet   Other Relevant Orders   TSH   T4, free     Genitourinary   Chronic kidney disease, stage III (moderate) (HCC)    Chronic, ongoing, remains stable on recent labs with cardiology.  Continue to monitor and consider referral to nephrology if any worsening.      Relevant Orders   Bayer DCA Hb A1c Waived   Microalbumin, Urine Waived   CBC with Differential/Platelet   Comprehensive metabolic panel     Other   Hyperlipidemia LDL goal <70    Chronic, ongoing. Continue Rosuvastatin 3 days a week as is tolerating.  Lipid panel today.      Relevant Medications   losartan (COZAAR) 50 MG tablet   carvedilol (COREG) 6.25 MG tablet   isosorbide mononitrate (IMDUR) 60 MG  24 hr tablet   Other Relevant Orders   Lipid Panel w/o Chol/HDL Ratio   Other Visit Diagnoses     IFG (impaired fasting glucose)       A1c check today.   Flu vaccine need       Flu vaccine today   Relevant Orders   Flu Vaccine QUAD High Dose(Fluad)        Follow up plan: Return in about 6 months  (around 07/27/2021) for HTN/HLD, GERD, CKD, THYROID.

## 2021-01-27 NOTE — Assessment & Plan Note (Signed)
Chronic, ongoing.  Continue current medication regimen and adjust as needed.  Repeat TSH and Free T4 today -- discussed with his son.

## 2021-01-27 NOTE — Patient Instructions (Signed)

## 2021-01-27 NOTE — Assessment & Plan Note (Signed)
Chronic, ongoing with BP at goal for age today.  Recommend he monitor BP at least a few mornings a week at home and document.  DASH diet at home.  Continue current medication regimen and adjust as needed.  Labs today: CBC, CMP, urine ALB.  Return in 6 months.

## 2021-01-27 NOTE — Assessment & Plan Note (Signed)
Chronic, ongoing, followed by cardiology.  Recent EF 55-60%, repeat echo ordered by cardiology.  At this time continue current medication regimen as prescribed by cardiology, refills sent on some.  Recommend: - Reminded to call for an overnight weight gain of >2 pounds or a weekly weight gain of >5 pounds - not adding salt to food and read food labels. Reviewed the importance of keeping daily sodium intake to 2000mg  daily. - Avoid NSAIDs

## 2021-01-27 NOTE — Assessment & Plan Note (Signed)
Chronic, ongoing, followed by cardiology.  Continue this collaboration and current medication regiment as prescribed by them.  Recent note reviewed.

## 2021-01-28 ENCOUNTER — Other Ambulatory Visit: Payer: Self-pay | Admitting: Nurse Practitioner

## 2021-01-28 DIAGNOSIS — E039 Hypothyroidism, unspecified: Secondary | ICD-10-CM

## 2021-01-28 LAB — LIPID PANEL W/O CHOL/HDL RATIO
Cholesterol, Total: 106 mg/dL (ref 100–199)
HDL: 35 mg/dL — ABNORMAL LOW (ref >39)
LDL Chol Calc (NIH): 45 mg/dL (ref 0–99)
Triglycerides: 151 mg/dL — ABNORMAL HIGH (ref 0–149)
VLDL Cholesterol Cal: 26 mg/dL (ref 5–40)

## 2021-01-28 LAB — COMPREHENSIVE METABOLIC PANEL
ALT: 13 IU/L (ref 0–44)
AST: 18 IU/L (ref 0–40)
Albumin/Globulin Ratio: 1.8 (ref 1.2–2.2)
Albumin: 4.5 g/dL (ref 3.6–4.6)
Alkaline Phosphatase: 114 IU/L (ref 44–121)
BUN/Creatinine Ratio: 14 (ref 10–24)
BUN: 20 mg/dL (ref 8–27)
Bilirubin Total: 1.1 mg/dL (ref 0.0–1.2)
CO2: 26 mmol/L (ref 20–29)
Calcium: 9.8 mg/dL (ref 8.6–10.2)
Chloride: 98 mmol/L (ref 96–106)
Creatinine, Ser: 1.47 mg/dL — ABNORMAL HIGH (ref 0.76–1.27)
Globulin, Total: 2.5 g/dL (ref 1.5–4.5)
Glucose: 91 mg/dL (ref 70–99)
Potassium: 4.6 mmol/L (ref 3.5–5.2)
Sodium: 141 mmol/L (ref 134–144)
Total Protein: 7 g/dL (ref 6.0–8.5)
eGFR: 47 mL/min/{1.73_m2} — ABNORMAL LOW (ref >59)

## 2021-01-28 LAB — CBC WITH DIFFERENTIAL/PLATELET
Basophils Absolute: 0 10*3/uL (ref 0.0–0.2)
Basos: 1 %
EOS (ABSOLUTE): 0.4 10*3/uL (ref 0.0–0.4)
Eos: 9 %
Hematocrit: 45.8 % (ref 37.5–51.0)
Hemoglobin: 15.5 g/dL (ref 13.0–17.7)
Immature Grans (Abs): 0 10*3/uL (ref 0.0–0.1)
Immature Granulocytes: 0 %
Lymphocytes Absolute: 2.1 10*3/uL (ref 0.7–3.1)
Lymphs: 44 %
MCH: 30.6 pg (ref 26.6–33.0)
MCHC: 33.8 g/dL (ref 31.5–35.7)
MCV: 90 fL (ref 79–97)
Monocytes Absolute: 0.6 10*3/uL (ref 0.1–0.9)
Monocytes: 11 %
Neutrophils Absolute: 1.7 10*3/uL (ref 1.4–7.0)
Neutrophils: 35 %
Platelets: 255 10*3/uL (ref 150–450)
RBC: 5.07 x10E6/uL (ref 4.14–5.80)
RDW: 13.7 % (ref 11.6–15.4)
WBC: 4.8 10*3/uL (ref 3.4–10.8)

## 2021-01-28 LAB — T4, FREE: Free T4: 1.13 ng/dL (ref 0.82–1.77)

## 2021-01-28 LAB — TSH: TSH: 8.89 u[IU]/mL — ABNORMAL HIGH (ref 0.450–4.500)

## 2021-01-28 MED ORDER — LEVOTHYROXINE SODIUM 75 MCG PO TABS: 75.0000 ug | ORAL_TABLET | Freq: Every day | ORAL | 3 refills | Status: DC

## 2021-01-28 NOTE — Progress Notes (Signed)
Good morning crew, please let Gerald Hodges and his family his labs have returned: - CBC shows no anemia - Kidney function continues to show stable kidney disease with no worsening, we will continue to monitor.  Liver function is normal. - Cholesterol labs are stable -- continue Rosuvastatin daily. - Thyroid labs have returned showing elevation in TSH and normal Free T4.  Due to this I would like to increase your Levothyroxine to 75 MCG and stop 50 MCG dosing.  I have sent in new dosing, please start this and return in 6 weeks for lab only visit.  Staff please schedule lab only visit for him.  Any questions? Keep being awesome!!  Thank you for allowing me to participate in your care.  I appreciate you. Kindest regards, Florentino Laabs

## 2021-01-28 NOTE — Addendum Note (Signed)
Addended by: Aura Dials T on: 01/28/2021 08:00 PM   Modules accepted: Orders

## 2021-02-02 DIAGNOSIS — H6123 Impacted cerumen, bilateral: Secondary | ICD-10-CM | POA: Diagnosis not present

## 2021-02-02 DIAGNOSIS — H903 Sensorineural hearing loss, bilateral: Secondary | ICD-10-CM | POA: Diagnosis not present

## 2021-02-15 ENCOUNTER — Telehealth: Payer: Self-pay | Admitting: *Deleted

## 2021-02-15 ENCOUNTER — Other Ambulatory Visit: Payer: Self-pay

## 2021-02-15 ENCOUNTER — Ambulatory Visit (INDEPENDENT_AMBULATORY_CARE_PROVIDER_SITE_OTHER): Payer: Medicaid Other

## 2021-02-15 DIAGNOSIS — I2510 Atherosclerotic heart disease of native coronary artery without angina pectoris: Secondary | ICD-10-CM

## 2021-02-15 DIAGNOSIS — I5032 Chronic diastolic (congestive) heart failure: Secondary | ICD-10-CM | POA: Diagnosis not present

## 2021-02-15 LAB — ECHOCARDIOGRAM COMPLETE
AR max vel: 1.4 cm2
AV Area VTI: 1.39 cm2
AV Area mean vel: 1.31 cm2
AV Mean grad: 8 mmHg
AV Peak grad: 13.7 mmHg
Ao pk vel: 1.85 m/s
Area-P 1/2: 4.06 cm2
Calc EF: 51.3 %
P 1/2 time: 831 msec
S' Lateral: 3.2 cm
Single Plane A2C EF: 52.4 %
Single Plane A4C EF: 49.1 %

## 2021-02-15 NOTE — Telephone Encounter (Signed)
Spoke with patients son per release form using [employer] [place] ID# [phone]. Reviewed results of patients echocardiogram with his son and he verbalized understanding with no further questions at this time.

## 2021-02-15 NOTE — Telephone Encounter (Signed)
-----   Message from Sondra Barges, PA-C sent at 02/15/2021  1:36 PM EST ----- Please inform patient his echo demonstrated a normal pump function, normal wall motion, slightly stiffened heart, normal pressure within the right side of the heart, mildly dilated left atrium, mildly leaky mitral valve.  When compared to prior echo, the stiffness of his heart is somewhat improved and his leaky mitral valve remains stable.  Continued optimal blood pressure control is recommended.

## 2021-02-23 ENCOUNTER — Ambulatory Visit: Payer: Medicaid Other | Admitting: Nurse Practitioner

## 2021-02-23 ENCOUNTER — Encounter: Payer: Self-pay | Admitting: Nurse Practitioner

## 2021-02-23 ENCOUNTER — Other Ambulatory Visit: Payer: Self-pay

## 2021-02-23 VITALS — BP 122/73 | HR 92 | Temp 98.1°F

## 2021-02-23 DIAGNOSIS — R051 Acute cough: Secondary | ICD-10-CM | POA: Diagnosis not present

## 2021-02-23 DIAGNOSIS — J101 Influenza due to other identified influenza virus with other respiratory manifestations: Secondary | ICD-10-CM | POA: Insufficient documentation

## 2021-02-23 DIAGNOSIS — R059 Cough, unspecified: Secondary | ICD-10-CM | POA: Insufficient documentation

## 2021-02-23 DIAGNOSIS — R509 Fever, unspecified: Secondary | ICD-10-CM

## 2021-02-23 MED ORDER — AMOXICILLIN-POT CLAVULANATE 875-125 MG PO TABS: 1.0000 | ORAL_TABLET | Freq: Two times a day (BID) | ORAL | 0 refills | Status: AC

## 2021-02-23 NOTE — Assessment & Plan Note (Signed)
Acute for 2 weeks.  Will test for Covid and flu in office today.  Recommend self quarantine until testing returned which may be 24 to 48 hours.  At this time due to age and duration of cough will initiate Augmentin, script sent.  Recommend: - Increased rest - Increasing Fluids - Acetaminophen as needed for fever/pain.  - Salt water gargling, chloraseptic spray and throat lozenges - Mucinex.  Return in 2 weeks for follow-up.

## 2021-02-23 NOTE — Progress Notes (Signed)
BP 122/73    Pulse 92    Temp 98.1 F (36.7 C)    SpO2 99%    Subjective:    Patient ID: Gerald Hodges, male    DOB: October 07, 1935, 85 y.o.   MRN: [id number]  HPI: Gerald Hodges is a 85 y.o. male  Chief Complaint  Patient presents with   Cough    Patient and his son states the patient has been symptomatic for 2 weeks. Patient states he has tried Tylenol. Patient denies having any exposed to anyone with Covid and Flu. Patient denied having any at-home covid testing. Patient son thinks he patient has Flu. As the son states he was having the same symptoms as the patient.    Headache   Generalized Body Aches   UPPER RESPIRATORY TRACT INFECTION Current symptoms started 2 weeks ago, started out with a cough.  He did have a fever at the time.  No testing for Covid or flu recently.  He does not recall being around anyone who was sick.  This is new cough and different then his cough back in July when he was seen for TB and sent to health department. Fever: yes Cough: yes Shortness of breath: no Wheezing: yes Chest pain: no Chest tightness: yes Chest congestion: yes Nasal congestion: yes Runny nose: yes Post nasal drip: yes Sneezing: no Sore throat: yes Swollen glands: no Sinus pressure: yes Headache: yes Face pain: no Toothache: no Ear pain: none Ear pressure: none Eyes red/itching:no Eye drainage/crusting: no  Vomiting: no Rash: no Fatigue: yes Sick contacts: no Strep contacts: no  Context: stable Recurrent sinusitis: no Relief with OTC cold/cough medications: no  Treatments attempted: Tylenol  Relevant past medical, surgical, family and social history reviewed and updated as indicated. Interim medical history since our last visit reviewed. Allergies and medications reviewed and updated.  Review of Systems  Constitutional:  Positive for fatigue and fever. Negative for activity change, appetite change, chills and diaphoresis.  HENT:  Positive for  congestion, postnasal drip, rhinorrhea, sinus pressure and sore throat. Negative for ear discharge, ear pain, sinus pain and voice change.   Respiratory:  Positive for cough and wheezing. Negative for chest tightness and shortness of breath.   Cardiovascular:  Negative for chest pain, palpitations and leg swelling.  Gastrointestinal: Negative.   Endocrine: Negative for cold intolerance and heat intolerance.  Neurological:  Positive for headaches. Negative for dizziness and weakness.  Psychiatric/Behavioral: Negative.     Per HPI unless specifically indicated above     Objective:    BP 122/73    Pulse 92    Temp 98.1 F (36.7 C)    SpO2 99%   Wt Readings from Last 3 Encounters:  01/27/21 187 lb 3.2 oz (84.9 kg)  01/11/21 185 lb (83.9 kg)  12/31/20 184 lb 9.6 oz (83.7 kg)    Physical Exam Vitals and nursing note reviewed.  Constitutional:      General: He is awake. He is not in acute distress.    Appearance: He is well-developed and well-groomed. He is obese. He is not ill-appearing or toxic-appearing.  HENT:     Head: Normocephalic and atraumatic.     Right Ear: Hearing, ear canal and external ear normal. No drainage. A middle ear effusion is present. There is no impacted cerumen.     Left Ear: Hearing, ear canal and external ear normal. No drainage. A middle ear effusion is present. There is no impacted cerumen.  Nose: Rhinorrhea present. Rhinorrhea is clear.     Right Sinus: No maxillary sinus tenderness or frontal sinus tenderness.     Left Sinus: No maxillary sinus tenderness or frontal sinus tenderness.     Mouth/Throat:     Mouth: Mucous membranes are moist.     Pharynx: Posterior oropharyngeal erythema (mild with cobblestoning) present. No pharyngeal swelling or oropharyngeal exudate.  Eyes:     General: Lids are normal.        Right eye: No discharge.        Left eye: No discharge.     Conjunctiva/sclera: Conjunctivae normal.     Pupils: Pupils are equal, round, and  reactive to light.  Neck:     Thyroid: No thyromegaly.     Vascular: No carotid bruit.     Trachea: Trachea normal.  Cardiovascular:     Rate and Rhythm: Normal rate and regular rhythm.     Heart sounds: Normal heart sounds, S1 normal and S2 normal. No murmur heard.   No gallop.  Pulmonary:     Effort: Pulmonary effort is normal. No accessory muscle usage or respiratory distress.     Breath sounds: Normal breath sounds.  Abdominal:     General: Bowel sounds are normal.     Palpations: Abdomen is soft.  Musculoskeletal:        General: Normal range of motion.     Cervical back: Normal range of motion and neck supple.     Right lower leg: No edema.     Left lower leg: No edema.  Lymphadenopathy:     Cervical: No cervical adenopathy.  Skin:    General: Skin is warm and dry.     Capillary Refill: Capillary refill takes less than 2 seconds.  Neurological:     Mental Status: He is alert and oriented to person, place, and time.     Deep Tendon Reflexes: Reflexes are normal and symmetric.     Reflex Scores:      Brachioradialis reflexes are 2+ on the right side and 2+ on the left side.      Patellar reflexes are 2+ on the right side and 2+ on the left side. Psychiatric:        Attention and Perception: Attention normal.        Mood and Affect: Mood normal.        Speech: Speech normal.        Behavior: Behavior normal. Behavior is cooperative.        Thought Content: Thought content normal.    Results for orders placed or performed in visit on 02/15/21  ECHOCARDIOGRAM COMPLETE  Result Value Ref Range   AR max vel 1.40 cm2   AV Peak grad 13.7 mmHg   Ao pk vel 1.85 m/s   S' Lateral 3.20 cm   Area-P 1/2 4.06 cm2   AV Area VTI 1.39 cm2   AV Mean grad 8.0 mmHg   Single Plane A4C EF 49.1 %   Single Plane A2C EF 52.4 %   Calc EF 51.3 %   P 1/2 time 831 msec   AV Area mean vel 1.31 cm2      Assessment & Plan:   Problem List Items Addressed This Visit       Other   Cough  - Primary    Acute for 2 weeks.  Will test for Covid and flu in office today.  Recommend self quarantine until testing returned which may be 24 to  48 hours.  At this time due to age and duration of cough will initiate Augmentin, script sent.  Recommend: - Increased rest - Increasing Fluids - Acetaminophen as needed for fever/pain.  - Salt water gargling, chloraseptic spray and throat lozenges - Mucinex.  Return in 2 weeks for follow-up.      Other Visit Diagnoses     Fever, unspecified fever cause       Refer to acute cough plan of care.   Relevant Orders   Influenza a and b   Novel Coronavirus, NAA ([facility])        Follow up plan: Return in about 2 weeks (around 03/09/2021) for Cough.

## 2021-02-23 NOTE — Patient Instructions (Addendum)
Tos en los adultos Cough, Adult La tos ayuda a despejar la garganta y los pulmones. La tos puede ser un signode una enfermedad u otra afeccin mdica. Una tos aguda puede durar entre 2 o 3 semanas, mientras que una tos crnicapuede durar 8 semanas o ms tiempo. Hay muchas cosas que pueden causar tos. Estas incluyen lo siguiente: Grmenes (virus o bacterias) que atacan las vas respiratorias. Inhalacin de cosas que alteran (irritan) los pulmones. Alergias. Asma. Mucosidad que se desliza por la parte posterior de la garganta (goteo posnasal). Fumar. cido que vuelve desde el estmago hacia el tubo que transporta los alimentos desde la boca hasta el estmago (reflujo gastroesofgico). Algunos medicamentos. Problemas pulmonares. Otras enfermedades, como insuficiencia cardaca o un cogulo de sangre en el pulmn (embolia pulmonar). Siga estas instrucciones en su casa: Medicamentos Tome los medicamentos de venta libre y los recetados solamente como se lo haya indicado el mdico. Hable con el mdico antes de tomar medicamentos que detienen la tos (antitusivos). Estilo de vida  No fume y trate de no estar cerca de humo. No consuma ningn producto que contenga nicotina o tabaco, como cigarrillos, cigarrillos electrnicos y tabaco de mascar. Si necesita ayuda para dejar de fumar, consulte al mdico. Beba suficiente lquido para mantener el pis (la orina) de color amarillo plido. Evite la cafena. No beba alcohol si el mdico se lo prohbe.  Instrucciones generales  Fjese si hay algn cambio en la tos. Informe a su mdico sobre ellos. Siempre cbrase la boca al toser. Aljese de las cosas que lo hagan toser, como perfumes, velas, humo de fogatas o productos de limpieza. Si el aire es muy seco, use un humidificador o un vaporizador de niebla fra en su hogar. Si la tos empeora por la noche, pruebe con usar almohadas adicionales para elevar la cabeza mientras duerme. Descanse todo lo que sea  necesario. Concurra a todas las visitas de seguimiento como se lo haya indicado el mdico. Esto es importante.  Comunquese con un mdico si: Aparecen nuevos sntomas. Tose y escupe pus. La tos no mejora despus de 2 o 3 semanas o empeora. Los medicamentos para la tos no le alivian la tos y usted no duerme bien. Siente un dolor que empeora o un dolor que no se alivia con medicamentos. Tiene fiebre. Est adelgazando y no sabe por qu. Transpira durante la noche. Solicite ayuda inmediatamente si: Tose y escupe sangre. Tiene dificultad para respirar. El corazn le late muy rpido. Estos sntomas pueden indicar una emergencia. No espere a ver si los sntomas desaparecen. Solicite atencin mdica de inmediato. Comunquese con el servicio de emergencias de su localidad (911 en los [place]). No conduzca por sus propios medios hasta el hospital. Resumen La tos ayuda a despejar la garganta y los pulmones. Hay muchas cosas que pueden causar tos. Tome los medicamentos de venta libre y los recetados solamente como se lo haya indicado el mdico. Siempre cbrase la boca al toser. Comunquese con un mdico si tiene sntomas nuevos o tiene una tos que no mejora o que empeora. Esta informacin no tiene como fin reemplazar el consejo del mdico. Asegresede hacerle al mdico cualquier pregunta que tenga. Document Revised: 04/24/2018 Document Reviewed: 04/24/2018 Elsevier Patient Education  2022 [employer]  

## 2021-02-24 LAB — INFLUENZA A AND B
Influenza A Ag, EIA: NEGATIVE
Influenza B Ag, EIA: POSITIVE — AB

## 2021-02-24 MED ORDER — OSELTAMIVIR PHOSPHATE 30 MG PO CAPS: 30.0000 mg | ORAL_CAPSULE | Freq: Two times a day (BID) | ORAL | 0 refills | Status: AC

## 2021-02-24 NOTE — Addendum Note (Signed)
Addended by: Aura Dials T on: 02/24/2021 06:21 PM   Modules accepted: Orders

## 2021-02-24 NOTE — Progress Notes (Signed)
Please let Kassim's family know he returned positive for Flu and I have sent in Tamiflu to treat this that is dosed for kidney safety.  I sent this to [facility].  If any worsening symptoms please let me know. Keep being amazing!!  Thank you for allowing me to participate in your care.  I appreciate you. Kindest regards, Avaree Gilberti

## 2021-02-25 LAB — NOVEL CORONAVIRUS, NAA: SARS-CoV-2, NAA: NOT DETECTED

## 2021-02-25 LAB — SARS-COV-2, NAA 2 DAY TAT

## 2021-02-25 NOTE — Progress Notes (Signed)
Please let Zacharey know his Covid returned negative.:)

## 2021-03-16 ENCOUNTER — Other Ambulatory Visit: Payer: Medicaid Other

## 2021-03-30 ENCOUNTER — Other Ambulatory Visit: Payer: Self-pay

## 2021-03-30 ENCOUNTER — Ambulatory Visit: Payer: PRIVATE HEALTH INSURANCE | Admitting: Nurse Practitioner

## 2021-03-30 ENCOUNTER — Encounter: Payer: Self-pay | Admitting: Nurse Practitioner

## 2021-03-30 VITALS — BP 130/82 | HR 90 | Temp 98.3°F | Ht 64.5 in | Wt 192.0 lb

## 2021-03-30 DIAGNOSIS — E6609 Other obesity due to excess calories: Secondary | ICD-10-CM | POA: Diagnosis not present

## 2021-03-30 DIAGNOSIS — E039 Hypothyroidism, unspecified: Secondary | ICD-10-CM | POA: Diagnosis not present

## 2021-03-30 DIAGNOSIS — J101 Influenza due to other identified influenza virus with other respiratory manifestations: Secondary | ICD-10-CM

## 2021-03-30 DIAGNOSIS — Z6832 Body mass index (BMI) 32.0-32.9, adult: Secondary | ICD-10-CM

## 2021-03-30 DIAGNOSIS — E669 Obesity, unspecified: Secondary | ICD-10-CM | POA: Insufficient documentation

## 2021-03-30 DIAGNOSIS — L299 Pruritus, unspecified: Secondary | ICD-10-CM | POA: Diagnosis not present

## 2021-03-30 MED ORDER — TRIAMCINOLONE ACETONIDE 0.1 % EX CREA: 1.0000 "application " | TOPICAL_CREAM | Freq: Two times a day (BID) | CUTANEOUS | 0 refills | Status: DC

## 2021-03-30 MED ORDER — LORATADINE 10 MG PO TABS: 10.0000 mg | ORAL_TABLET | Freq: Every day | ORAL | 11 refills | Status: DC

## 2021-03-30 NOTE — Patient Instructions (Signed)
Hipotiroidismo Hypothyroidism  El hipotiroidismo ocurre cuando la glndula tiroidea no produce la cantidad suficiente de ciertas hormonas (es hipoactiva). La glndula tiroidea es una pequea glndula ubicada en la parte delantera inferior del cuello, justo delante de la trquea. Esta glndula produce hormonas que ayudan a controlar la forma en la que el cuerpo usa los alimentos para obtener energa (metabolismo) as como tambin la funcin cardaca y la funcin cerebral. Estas hormonas tambin juegan un papel para mantener los huesos fuertes. Cuando la tiroides es hipoactiva, produce muy poca cantidad de las hormonas tiroxina (T4) y triyodotironina (T3). Cules son las causas? Esta afeccin puede ser causada por lo siguiente: Enfermedad de Hashimoto. Se trata de una enfermedad por la cual el sistema del cuerpo encargado de combatir las enfermedades (sistema inmunitario) ataca la glndula tiroidea. Esta es la causa ms frecuente. Infecciones virales. Embarazo. Ciertos medicamentos. Defectos congnitos. Radioterapias anteriores en la cabeza o el cuello para el cncer. Tratamiento previo con yodo radioactivo. Exposicin a la radiacin en el ambiente, en el pasado. Extirpacin quirrgica previa de una parte o de toda la tiroides. Problemas con una glndula ubicada en el centro del cerebro (hipfisis). Falta de una cantidad suficiente de yodo en la dieta. Qu incrementa el riesgo? Es ms probable que sufra esta afeccin si: Es mujer. Tiene antecedentes familiares de afecciones tiroideas. Usa un medicamento denominado litio. Toma medicamentos que afectan el sistema inmunitario (inmunosupresores). Cules son los signos o sntomas? Los sntomas de esta afeccin incluyen: Sensacin de falta de energa (letargo). Incapacidad para tolerar el fro. Aumento de peso que no puede explicarse por un cambio en la dieta o en los hbitos de ejercicio fsico. Falta de apetito. Piel seca. Pelo  grueso. Irregularidades menstruales. Ralentizacin de los procesos de pensamiento. Estreimiento. Tristeza o depresin. Cmo se diagnostica? Esta afeccin se puede diagnosticar en funcin de lo siguiente: Los sntomas, los antecedentes mdicos y un examen fsico. Anlisis de sangre. Tambin puede someterse a estudios por imgenes como una ecografa o unaresonancia magntica (RM). Cmo se trata? Esta afeccin se trata con medicamentos que reemplazan las hormonas tiroideas que el cuerpo no produce. Despus de comenzar el tratamiento, pueden pasarvarias semanas hasta la desaparicin de los sntomas. Siga estas instrucciones en su casa: Use los medicamentos de venta libre y los recetados solamente como se lo haya indicado el mdico. Si empieza a tomar medicamentos nuevos, infrmele al mdico. Concurra a todas las visitas de seguimiento como se lo haya indicado el mdico. Esto es importante. A medida que la afeccin mejora, es posible que haya que modificar las dosis de los medicamentos de hormona tiroidea. Tendr que hacerse anlisis de sangre peridicamente, de modo que el mdico pueda controlar la afeccin. Comunquese con un mdico si: Los sntomas no mejoran con el tratamiento. Est tomando medicamentos de reemplazo de la hormona tiroidea y: Suda mucho. Tiene temblores. Se siente ansioso. Baja de peso rpidamente. No puede tolerar el calor. Tiene cambios emocionales. Tiene diarrea. Se siente dbil. Solicite ayuda de inmediato si tiene: Dolor de pecho. Latidos cardacos irregulares. Latidos cardacos rpidos. Dificultad para respirar. Resumen El hipotiroidismo ocurre cuando la glndula tiroidea no produce la cantidad suficiente de ciertas hormonas (es hipoactiva). Cuando la tiroides es hipoactiva, produce muy poca cantidad de las hormonas tiroxina (T4) y triyodotironina (T3). La causa ms frecuente es la enfermedad de Hashimoto, una enfermedad por la cual el sistema del cuerpo  encargado de combatir las enfermedades (sistema inmunitario) ataca la glndula tiroidea. La afeccin tambin puede ser consecuencia de infecciones virales,   medicamentos, el embarazo o luego de un tratamiento de radioterapia en la cabeza o el cuello. Los sntomas pueden incluir aumento de peso, piel seca, estreimiento, sensacin de no tener energa e incapacidad para tolerar el fro. Esta afeccin se trata con medicamentos que reemplazan las hormonas tiroideas que el cuerpo no produce. Esta informacin no tiene como fin reemplazar el consejo del mdico. Asegresede hacerle al mdico cualquier pregunta que tenga. Document Revised: 01/01/2020 Document Reviewed: 12/05/2019 Elsevier Patient Education  2022 [employer]  

## 2021-03-30 NOTE — Assessment & Plan Note (Signed)
Acute and improved at this time -- no further cough or fever.

## 2021-03-30 NOTE — Assessment & Plan Note (Signed)
Chronic, ongoing.  Continue current medication regimen and adjust as needed -- recent levels elevated.  Repeat TSH and Free T4 + antibody today -- discussed with his son.

## 2021-03-30 NOTE — Assessment & Plan Note (Signed)
BMI 32.45.  Recommended eating smaller high protein, low fat meals more frequently and exercising 30 mins a day 5 times a week with a goal of 10-15lb weight loss in the next 3 months. Patient voiced their understanding and motivation to adhere to these recommendations.

## 2021-03-30 NOTE — Progress Notes (Signed)
BP 130/82    Pulse 90    Temp 98.3 F (36.8 C) (Oral)    Ht 5' 4.5" (1.638 m)    Wt 192 lb (87.1 kg)    SpO2 97%    BMI 32.45 kg/m    Subjective:    Patient ID: Gerald Hodges, male    DOB: 1936-01-27, 86 y.o.   MRN: [id number]  HPI: Gerald Hodges is a 86 y.o. male  Chief Complaint  Patient presents with   Hypothyroidism    Patient is here to follow up on Hypothyroidism.    Skin Problem    Patient states he is having issues with his itching that starts after a procedure that he had and it was after using the Anesthesia. Patient states the procedure was 3 months ago and that is when the itching started.    Interpreter at bedside to assist with HPI.  ITCHY SKIN Reports having itchy skin since surgical procedure 12/14/20 -- he feels the itchy skin started after this.  He has tried changing shower temperature -- hot to cold, but it has not gone away.  Itching has been all the time after surgery.  All over, back, arms, abdomen.  He reports he does have a rash on occasion, deep inside.  All this happened after surgery.    HYPOTHYROIDISM Taking Levothyroxine 75 MCG daily at home.  Recent TSH was slightly elevated.  Recently had the flu 02/23/21 but this has overall improved. Thyroid control status:uncontrolled Satisfied with current treatment? yes Medication side effects: no Medication compliance: good compliance Etiology of hypothyroidism:  Recent dose adjustment:yes increased to 75 MCG Fatigue: no Cold intolerance: no Heat intolerance: no Weight gain: no Weight loss: no Constipation: no Diarrhea/loose stools: no - had episode 15 days ago Palpitations: no Lower extremity edema: no Anxiety/depressed mood: no   Relevant past medical, surgical, family and social history reviewed and updated as indicated. Interim medical history since our last visit reviewed. Allergies and medications reviewed and updated.  Review of Systems  Constitutional:  Negative for  activity change, diaphoresis, fatigue and fever.  Respiratory:  Negative for cough, chest tightness, shortness of breath and wheezing.   Cardiovascular:  Negative for chest pain, palpitations and leg swelling.  Gastrointestinal: Negative.   Endocrine: Negative for cold intolerance, heat intolerance, polydipsia, polyphagia and polyuria.  Musculoskeletal: Negative.   Skin: Negative.   Neurological: Negative.   Psychiatric/Behavioral: Negative.     Per HPI unless specifically indicated above     Objective:    BP 130/82    Pulse 90    Temp 98.3 F (36.8 C) (Oral)    Ht 5' 4.5" (1.638 m)    Wt 192 lb (87.1 kg)    SpO2 97%    BMI 32.45 kg/m   Wt Readings from Last 3 Encounters:  03/30/21 192 lb (87.1 kg)  01/27/21 187 lb 3.2 oz (84.9 kg)  01/11/21 185 lb (83.9 kg)    Physical Exam Vitals and nursing note reviewed.  Constitutional:      General: He is awake. He is not in acute distress.    Appearance: He is well-developed and well-groomed. He is obese. He is not ill-appearing or toxic-appearing.  HENT:     Head: Normocephalic and atraumatic.     Right Ear: Hearing normal. No drainage.     Left Ear: Hearing normal. No drainage.  Eyes:     General: Lids are normal.        Right eye: No  discharge.        Left eye: No discharge.     Conjunctiva/sclera: Conjunctivae normal.     Pupils: Pupils are equal, round, and reactive to light.  Neck:     Thyroid: No thyromegaly.     Vascular: No carotid bruit.     Trachea: Trachea normal.  Cardiovascular:     Rate and Rhythm: Normal rate and regular rhythm.     Heart sounds: Normal heart sounds, S1 normal and S2 normal. No murmur heard.   No gallop.  Pulmonary:     Effort: Pulmonary effort is normal. No accessory muscle usage or respiratory distress.     Breath sounds: Normal breath sounds.  Abdominal:     General: Bowel sounds are normal.     Palpations: Abdomen is soft. There is no hepatomegaly or splenomegaly.  Musculoskeletal:         General: Normal range of motion.     Cervical back: Normal range of motion and neck supple.     Right lower leg: No edema.     Left lower leg: No edema.  Lymphadenopathy:     Cervical: No cervical adenopathy.  Skin:    General: Skin is warm and dry.     Capillary Refill: Capillary refill takes less than 2 seconds.  Neurological:     Mental Status: He is alert and oriented to person, place, and time.     Deep Tendon Reflexes: Reflexes are normal and symmetric.     Reflex Scores:      Brachioradialis reflexes are 2+ on the right side and 2+ on the left side.      Patellar reflexes are 2+ on the right side and 2+ on the left side. Psychiatric:        Attention and Perception: Attention normal.        Mood and Affect: Mood normal.        Speech: Speech normal.        Behavior: Behavior normal. Behavior is cooperative.        Thought Content: Thought content normal.   Results for orders placed or performed in visit on 02/23/21  Influenza a and b   Specimen: Nasopharyngeal   Naso  Result Value Ref Range   Influenza A Ag, EIA Negative Negative   Influenza B Ag, EIA Positive (A) Negative   Influenza Comment None   Novel Coronavirus, NAA ([facility])   Specimen: Nasopharyngeal(NP) swabs in vial transport medium  Result Value Ref Range   SARS-CoV-2, NAA Not Detected Not Detected  SARS-COV-2, NAA 2 DAY TAT  Result Value Ref Range   SARS-CoV-2, NAA 2 DAY TAT Performed       Assessment & Plan:   Problem List Items Addressed This Visit       Respiratory   Influenza B - Primary    Acute and improved at this time -- no further cough or fever.          Endocrine   Hypothyroidism    Chronic, ongoing.  Continue current medication regimen and adjust as needed -- recent levels elevated.  Repeat TSH and Free T4 + antibody today -- discussed with his son.         Relevant Orders   Thyroid peroxidase antibody   T4, free   TSH     Musculoskeletal and Integument   Pruritic  dermatitis    Presented after recent surgery, no rashes noted today.  At this time start Claritin 10 MG daily and  send in Triamcinolone cream to use as needed if rash presents, he reports intermittent rash.  Return if worsening or ongoing symptoms.        Other   Obesity    BMI 32.45.  Recommended eating smaller high protein, low fat meals more frequently and exercising 30 mins a day 5 times a week with a goal of 10-15lb weight loss in the next 3 months. Patient voiced their understanding and motivation to adhere to these recommendations.         Follow up plan: Return in about 6 months (around 09/27/2021) for HTN/HF/HLD, HYPOTHYROID, CKD, GOUT.

## 2021-03-30 NOTE — Assessment & Plan Note (Signed)
Presented after recent surgery, no rashes noted today.  At this time start Claritin 10 MG daily and send in Triamcinolone cream to use as needed if rash presents, he reports intermittent rash.  Return if worsening or ongoing symptoms.

## 2021-03-31 LAB — T4, FREE: Free T4: 1.27 ng/dL (ref 0.82–1.77)

## 2021-03-31 LAB — THYROID PEROXIDASE ANTIBODY: Thyroperoxidase Ab SerPl-aCnc: 9 IU/mL (ref 0–34)

## 2021-03-31 LAB — TSH: TSH: 3.86 u[IU]/mL (ref 0.450–4.500)

## 2021-03-31 NOTE — Progress Notes (Signed)
Good afternoon, please let Gerald Hodges know his thyroid labs are normal this check.  Continue current Levothyroxine dosing.  If any questions let me know.  And no more [name] dog exposure.:) Keep being amazing!!  Thank you for allowing me to participate in your care.  I appreciate you. Kindest regards, Shereen Marton

## 2021-04-02 ENCOUNTER — Inpatient Hospital Stay
Admission: EM | Admit: 2021-04-02 | Discharge: 2021-04-06 | DRG: 287 | Disposition: A | Discharge status: Home / Self Care | Payer: Medicaid Other | Attending: Internal Medicine | Admitting: Internal Medicine

## 2021-04-02 ENCOUNTER — Encounter: Payer: Self-pay | Admitting: Family Medicine

## 2021-04-02 ENCOUNTER — Other Ambulatory Visit: Payer: Self-pay

## 2021-04-02 ENCOUNTER — Inpatient Hospital Stay: Admit: 2021-04-02 | Payer: Medicaid Other

## 2021-04-02 ENCOUNTER — Emergency Department: Payer: Medicaid Other

## 2021-04-02 DIAGNOSIS — K219 Gastro-esophageal reflux disease without esophagitis: Secondary | ICD-10-CM | POA: Diagnosis present

## 2021-04-02 DIAGNOSIS — I959 Hypotension, unspecified: Secondary | ICD-10-CM | POA: Diagnosis present

## 2021-04-02 DIAGNOSIS — I248 Other forms of acute ischemic heart disease: Secondary | ICD-10-CM | POA: Diagnosis present

## 2021-04-02 DIAGNOSIS — R946 Abnormal results of thyroid function studies: Secondary | ICD-10-CM | POA: Diagnosis present

## 2021-04-02 DIAGNOSIS — N1832 Chronic kidney disease, stage 3b: Secondary | ICD-10-CM | POA: Diagnosis present

## 2021-04-02 DIAGNOSIS — Z8673 Personal history of transient ischemic attack (TIA), and cerebral infarction without residual deficits: Secondary | ICD-10-CM | POA: Diagnosis not present

## 2021-04-02 DIAGNOSIS — R739 Hyperglycemia, unspecified: Secondary | ICD-10-CM | POA: Diagnosis present

## 2021-04-02 DIAGNOSIS — Z9841 Cataract extraction status, right eye: Secondary | ICD-10-CM

## 2021-04-02 DIAGNOSIS — E871 Hypo-osmolality and hyponatremia: Secondary | ICD-10-CM | POA: Diagnosis present

## 2021-04-02 DIAGNOSIS — I700 Atherosclerosis of aorta: Secondary | ICD-10-CM | POA: Diagnosis present

## 2021-04-02 DIAGNOSIS — I214 Non-ST elevation (NSTEMI) myocardial infarction: Secondary | ICD-10-CM

## 2021-04-02 DIAGNOSIS — I208 Other forms of angina pectoris: Secondary | ICD-10-CM | POA: Diagnosis not present

## 2021-04-02 DIAGNOSIS — R17 Unspecified jaundice: Secondary | ICD-10-CM | POA: Diagnosis present

## 2021-04-02 DIAGNOSIS — Z79899 Other long term (current) drug therapy: Secondary | ICD-10-CM

## 2021-04-02 DIAGNOSIS — E785 Hyperlipidemia, unspecified: Secondary | ICD-10-CM | POA: Diagnosis present

## 2021-04-02 DIAGNOSIS — I2510 Atherosclerotic heart disease of native coronary artery without angina pectoris: Secondary | ICD-10-CM | POA: Diagnosis not present

## 2021-04-02 DIAGNOSIS — Z955 Presence of coronary angioplasty implant and graft: Secondary | ICD-10-CM

## 2021-04-02 DIAGNOSIS — I4510 Unspecified right bundle-branch block: Secondary | ICD-10-CM | POA: Diagnosis present

## 2021-04-02 DIAGNOSIS — Z8616 Personal history of COVID-19: Secondary | ICD-10-CM

## 2021-04-02 DIAGNOSIS — Z96652 Presence of left artificial knee joint: Secondary | ICD-10-CM | POA: Diagnosis present

## 2021-04-02 DIAGNOSIS — R0789 Other chest pain: Secondary | ICD-10-CM | POA: Diagnosis present

## 2021-04-02 DIAGNOSIS — Z886 Allergy status to analgesic agent status: Secondary | ICD-10-CM

## 2021-04-02 DIAGNOSIS — G4730 Sleep apnea, unspecified: Secondary | ICD-10-CM | POA: Diagnosis present

## 2021-04-02 DIAGNOSIS — Z961 Presence of intraocular lens: Secondary | ICD-10-CM | POA: Diagnosis present

## 2021-04-02 DIAGNOSIS — I440 Atrioventricular block, first degree: Secondary | ICD-10-CM | POA: Diagnosis present

## 2021-04-02 DIAGNOSIS — Z79890 Hormone replacement therapy (postmenopausal): Secondary | ICD-10-CM

## 2021-04-02 DIAGNOSIS — R55 Syncope and collapse: Secondary | ICD-10-CM

## 2021-04-02 DIAGNOSIS — E669 Obesity, unspecified: Secondary | ICD-10-CM | POA: Diagnosis present

## 2021-04-02 DIAGNOSIS — I9589 Other hypotension: Secondary | ICD-10-CM

## 2021-04-02 DIAGNOSIS — Z9842 Cataract extraction status, left eye: Secondary | ICD-10-CM

## 2021-04-02 DIAGNOSIS — Z888 Allergy status to other drugs, medicaments and biological substances status: Secondary | ICD-10-CM

## 2021-04-02 DIAGNOSIS — E039 Hypothyroidism, unspecified: Secondary | ICD-10-CM | POA: Diagnosis present

## 2021-04-02 DIAGNOSIS — N179 Acute kidney failure, unspecified: Secondary | ICD-10-CM | POA: Diagnosis present

## 2021-04-02 DIAGNOSIS — I5032 Chronic diastolic (congestive) heart failure: Secondary | ICD-10-CM | POA: Diagnosis present

## 2021-04-02 DIAGNOSIS — I130 Hypertensive heart and chronic kidney disease with heart failure and stage 1 through stage 4 chronic kidney disease, or unspecified chronic kidney disease: Secondary | ICD-10-CM | POA: Diagnosis present

## 2021-04-02 DIAGNOSIS — R748 Abnormal levels of other serum enzymes: Secondary | ICD-10-CM | POA: Diagnosis not present

## 2021-04-02 DIAGNOSIS — I4729 Other ventricular tachycardia: Secondary | ICD-10-CM | POA: Diagnosis present

## 2021-04-02 DIAGNOSIS — I472 Ventricular tachycardia, unspecified: Secondary | ICD-10-CM

## 2021-04-02 DIAGNOSIS — I25118 Atherosclerotic heart disease of native coronary artery with other forms of angina pectoris: Secondary | ICD-10-CM | POA: Diagnosis not present

## 2021-04-02 DIAGNOSIS — R42 Dizziness and giddiness: Secondary | ICD-10-CM

## 2021-04-02 DIAGNOSIS — Z20822 Contact with and (suspected) exposure to covid-19: Secondary | ICD-10-CM | POA: Diagnosis present

## 2021-04-02 DIAGNOSIS — R008 Other abnormalities of heart beat: Secondary | ICD-10-CM | POA: Diagnosis present

## 2021-04-02 DIAGNOSIS — Z6832 Body mass index (BMI) 32.0-32.9, adult: Secondary | ICD-10-CM

## 2021-04-02 DIAGNOSIS — Z7902 Long term (current) use of antithrombotics/antiplatelets: Secondary | ICD-10-CM

## 2021-04-02 DIAGNOSIS — M109 Gout, unspecified: Secondary | ICD-10-CM | POA: Diagnosis present

## 2021-04-02 DIAGNOSIS — Z800 Family history of malignant neoplasm of digestive organs: Secondary | ICD-10-CM

## 2021-04-02 HISTORY — DX: Non-ST elevation (NSTEMI) myocardial infarction: I21.4

## 2021-04-02 HISTORY — DX: Ventricular tachycardia, unspecified: I47.20

## 2021-04-02 LAB — GLUCOSE, CAPILLARY
Glucose-Capillary: 109 mg/dL — ABNORMAL HIGH (ref 70–99)
Glucose-Capillary: 119 mg/dL — ABNORMAL HIGH (ref 70–99)

## 2021-04-02 LAB — CBC WITH DIFFERENTIAL/PLATELET
Abs Immature Granulocytes: 0.02 10*3/uL (ref 0.00–0.07)
Basophils Absolute: 0.1 10*3/uL (ref 0.0–0.1)
Basophils Relative: 1 %
Eosinophils Absolute: 0.3 10*3/uL (ref 0.0–0.5)
Eosinophils Relative: 4 %
HCT: 43 % (ref 39.0–52.0)
Hemoglobin: 14.2 g/dL (ref 13.0–17.0)
Immature Granulocytes: 0 %
Lymphocytes Relative: 62 %
Lymphs Abs: 4.5 10*3/uL — ABNORMAL HIGH (ref 0.7–4.0)
MCH: 30.9 pg (ref 26.0–34.0)
MCHC: 33 g/dL (ref 30.0–36.0)
MCV: 93.7 fL (ref 80.0–100.0)
Monocytes Absolute: 0.5 10*3/uL (ref 0.1–1.0)
Monocytes Relative: 7 %
Neutro Abs: 1.9 10*3/uL (ref 1.7–7.7)
Neutrophils Relative %: 26 %
Platelets: 294 10*3/uL (ref 150–400)
RBC: 4.59 MIL/uL (ref 4.22–5.81)
RDW: 14 % (ref 11.5–15.5)
WBC: 7.1 10*3/uL (ref 4.0–10.5)
nRBC: 0 % (ref 0.0–0.2)

## 2021-04-02 LAB — BRAIN NATRIURETIC PEPTIDE: B Natriuretic Peptide: 179.6 pg/mL — ABNORMAL HIGH (ref 0.0–100.0)

## 2021-04-02 LAB — COMPREHENSIVE METABOLIC PANEL
ALT: 49 U/L — ABNORMAL HIGH (ref 0–44)
AST: 74 U/L — ABNORMAL HIGH (ref 15–41)
Albumin: 3.9 g/dL (ref 3.5–5.0)
Alkaline Phosphatase: 126 U/L (ref 38–126)
Anion gap: 13 (ref 5–15)
BUN: 23 mg/dL (ref 8–23)
CO2: 18 mmol/L — ABNORMAL LOW (ref 22–32)
Calcium: 9.3 mg/dL (ref 8.9–10.3)
Chloride: 103 mmol/L (ref 98–111)
Creatinine, Ser: 1.84 mg/dL — ABNORMAL HIGH (ref 0.61–1.24)
GFR, Estimated: 36 mL/min — ABNORMAL LOW (ref >60)
Glucose, Bld: 291 mg/dL — ABNORMAL HIGH (ref 70–99)
Potassium: 4.5 mmol/L (ref 3.5–5.1)
Sodium: 134 mmol/L — ABNORMAL LOW (ref 135–145)
Total Bilirubin: 1.4 mg/dL — ABNORMAL HIGH (ref 0.3–1.2)
Total Protein: 7.1 g/dL (ref 6.5–8.1)

## 2021-04-02 LAB — RESP PANEL BY RT-PCR (FLU A&B, COVID) ARPGX2
Influenza A by PCR: NEGATIVE
Influenza B by PCR: NEGATIVE
SARS Coronavirus 2 by RT PCR: NEGATIVE

## 2021-04-02 LAB — TROPONIN I (HIGH SENSITIVITY)
Troponin I (High Sensitivity): 19 ng/L — ABNORMAL HIGH (ref <18)
Troponin I (High Sensitivity): 4508 ng/L (ref <18)

## 2021-04-02 LAB — MAGNESIUM: Magnesium: 1.9 mg/dL (ref 1.7–2.4)

## 2021-04-02 LAB — TSH: TSH: 11.193 u[IU]/mL — ABNORMAL HIGH (ref 0.350–4.500)

## 2021-04-02 LAB — PROTIME-INR
INR: 1.1 (ref 0.8–1.2)
Prothrombin Time: 14.3 seconds (ref 11.4–15.2)

## 2021-04-02 LAB — APTT: aPTT: 29 seconds (ref 24–36)

## 2021-04-02 LAB — HEPARIN LEVEL (UNFRACTIONATED): Heparin Unfractionated: 0.22 IU/mL — ABNORMAL LOW (ref 0.30–0.70)

## 2021-04-02 MED ORDER — AMLODIPINE BESYLATE 5 MG PO TABS
5.0000 mg | ORAL_TABLET | Freq: Every day | ORAL | Status: DC
  Administered 2021-04-02 – 2021-04-03 (×2): 5 mg via ORAL
  Filled 2021-04-02 (×2): qty 1

## 2021-04-02 MED ORDER — CARVEDILOL 6.25 MG PO TABS
6.2500 mg | ORAL_TABLET | Freq: Two times a day (BID) | ORAL | Status: DC
  Administered 2021-04-02 – 2021-04-05 (×6): 6.25 mg via ORAL
  Filled 2021-04-02 (×7): qty 1

## 2021-04-02 MED ORDER — TRIAMCINOLONE ACETONIDE 0.1 % EX CREA
1.0000 "application " | TOPICAL_CREAM | Freq: Two times a day (BID) | CUTANEOUS | Status: DC
  Administered 2021-04-04 – 2021-04-05 (×3): 1 via TOPICAL
  Filled 2021-04-02: qty 15

## 2021-04-02 MED ORDER — LOSARTAN POTASSIUM 50 MG PO TABS
50.0000 mg | ORAL_TABLET | Freq: Every day | ORAL | Status: DC
  Administered 2021-04-02 – 2021-04-06 (×5): 50 mg via ORAL
  Filled 2021-04-02 (×6): qty 1

## 2021-04-02 MED ORDER — ENOXAPARIN SODIUM 40 MG/0.4ML IJ SOSY: 40.0000 mg | PREFILLED_SYRINGE | INTRAMUSCULAR | Status: DC

## 2021-04-02 MED ORDER — HEPARIN (PORCINE) 25000 UT/250ML-% IV SOLN
1100.0000 [IU]/h | INTRAVENOUS | Status: DC
  Administered 2021-04-02: 950 [IU]/h via INTRAVENOUS
  Administered 2021-04-03: 1100 [IU]/h via INTRAVENOUS
  Filled 2021-04-02 (×2): qty 250

## 2021-04-02 MED ORDER — LORATADINE 10 MG PO TABS
10.0000 mg | ORAL_TABLET | Freq: Every day | ORAL | Status: DC
  Administered 2021-04-03 – 2021-04-06 (×4): 10 mg via ORAL
  Filled 2021-04-02 (×4): qty 1

## 2021-04-02 MED ORDER — ALLOPURINOL 100 MG PO TABS
100.0000 mg | ORAL_TABLET | Freq: Every day | ORAL | Status: DC
  Administered 2021-04-03 – 2021-04-06 (×3): 100 mg via ORAL
  Filled 2021-04-02 (×5): qty 1

## 2021-04-02 MED ORDER — LEVOTHYROXINE SODIUM 50 MCG PO TABS
75.0000 ug | ORAL_TABLET | Freq: Every day | ORAL | Status: DC
  Administered 2021-04-02 – 2021-04-06 (×5): 75 ug via ORAL
  Filled 2021-04-02: qty 1
  Filled 2021-04-02: qty 2
  Filled 2021-04-02: qty 1
  Filled 2021-04-02 (×2): qty 2

## 2021-04-02 MED ORDER — NITROGLYCERIN 0.4 MG SL SUBL: 0.4000 mg | SUBLINGUAL_TABLET | SUBLINGUAL | Status: DC | PRN

## 2021-04-02 MED ORDER — ISOSORBIDE MONONITRATE ER 60 MG PO TB24
90.0000 mg | ORAL_TABLET | Freq: Every day | ORAL | Status: DC
  Administered 2021-04-02 – 2021-04-06 (×5): 90 mg via ORAL
  Filled 2021-04-02: qty 2
  Filled 2021-04-02 (×4): qty 1
  Filled 2021-04-02: qty 2

## 2021-04-02 MED ORDER — VITAMIN D3 25 MCG (1000 UNIT) PO TABS
1000.0000 [IU] | ORAL_TABLET | Freq: Every day | ORAL | Status: DC
  Administered 2021-04-03 – 2021-04-06 (×4): 1000 [IU] via ORAL
  Filled 2021-04-02 (×8): qty 1

## 2021-04-02 MED ORDER — PANTOPRAZOLE SODIUM 40 MG PO TBEC
40.0000 mg | DELAYED_RELEASE_TABLET | Freq: Every day | ORAL | Status: DC
  Administered 2021-04-03 – 2021-04-06 (×4): 40 mg via ORAL
  Filled 2021-04-02 (×4): qty 1

## 2021-04-02 MED ORDER — MECLIZINE HCL 25 MG PO TABS
12.5000 mg | ORAL_TABLET | Freq: Two times a day (BID) | ORAL | Status: DC | PRN
  Filled 2021-04-02: qty 0.5

## 2021-04-02 MED ORDER — SODIUM CHLORIDE 0.9 % IV SOLN: INTRAVENOUS | Status: DC

## 2021-04-02 MED ORDER — INSULIN ASPART 100 UNIT/ML IJ SOLN
0.0000 [IU] | Freq: Three times a day (TID) | INTRAMUSCULAR | Status: DC
  Filled 2021-04-02: qty 1

## 2021-04-02 MED ORDER — MAGNESIUM SULFATE 2 GM/50ML IV SOLN
2.0000 g | Freq: Once | INTRAVENOUS | Status: AC
  Administered 2021-04-02: 2 g via INTRAVENOUS
  Filled 2021-04-02: qty 50

## 2021-04-02 MED ORDER — ROSUVASTATIN CALCIUM 10 MG PO TABS
10.0000 mg | ORAL_TABLET | ORAL | Status: DC
  Administered 2021-04-04 – 2021-04-06 (×2): 10 mg via ORAL
  Filled 2021-04-02 (×3): qty 1

## 2021-04-02 MED ORDER — ONDANSETRON HCL 4 MG PO TABS: 4.0000 mg | ORAL_TABLET | Freq: Four times a day (QID) | ORAL | Status: DC | PRN

## 2021-04-02 MED ORDER — FUROSEMIDE 40 MG PO TABS
40.0000 mg | ORAL_TABLET | Freq: Every day | ORAL | Status: DC | PRN
  Administered 2021-04-03: 40 mg via ORAL
  Filled 2021-04-02: qty 1

## 2021-04-02 MED ORDER — CLOPIDOGREL BISULFATE 75 MG PO TABS
75.0000 mg | ORAL_TABLET | Freq: Every day | ORAL | Status: DC
  Administered 2021-04-02 – 2021-04-06 (×5): 75 mg via ORAL
  Filled 2021-04-02 (×6): qty 1

## 2021-04-02 MED ORDER — MAGNESIUM HYDROXIDE 400 MG/5ML PO SUSP: 30.0000 mL | Freq: Every day | ORAL | Status: DC | PRN

## 2021-04-02 MED ORDER — ACETAMINOPHEN 650 MG RE SUPP: 650.0000 mg | Freq: Four times a day (QID) | RECTAL | Status: DC | PRN

## 2021-04-02 MED ORDER — ACETAMINOPHEN 325 MG PO TABS
650.0000 mg | ORAL_TABLET | Freq: Four times a day (QID) | ORAL | Status: DC | PRN
  Administered 2021-04-02: 14:00:00 650 mg via ORAL
  Filled 2021-04-02: qty 2

## 2021-04-02 MED ORDER — ONDANSETRON HCL 4 MG/2ML IJ SOLN
4.0000 mg | Freq: Four times a day (QID) | INTRAMUSCULAR | Status: DC | PRN
  Administered 2021-04-04: 4 mg via INTRAVENOUS
  Filled 2021-04-02: qty 2

## 2021-04-02 MED ORDER — HEPARIN BOLUS VIA INFUSION
4000.0000 [IU] | Freq: Once | INTRAVENOUS | Status: AC
  Administered 2021-04-02: 4000 [IU] via INTRAVENOUS
  Filled 2021-04-02: qty 4000

## 2021-04-02 MED ORDER — HEPARIN BOLUS VIA INFUSION
1200.0000 [IU] | Freq: Once | INTRAVENOUS | Status: AC
  Administered 2021-04-02: 1200 [IU] via INTRAVENOUS
  Filled 2021-04-02: qty 1200

## 2021-04-02 NOTE — Progress Notes (Signed)
ANTICOAGULATION CONSULT NOTE   Pharmacy Consult for IV heparin Indication: chest pain/ACS  Allergies  Allergen Reactions   Aspirin Cough   Lisinopril Itching    Throat itching     Patient Measurements:   Heparin Dosing Weight: 78.9 kg  Vital Signs: Temp: 97.3 F (36.3 C) (01/21 1155) Temp Source: Oral (01/21 1155) BP: 94/57 (01/21 1123) Pulse Rate: 83 (01/21 1123)  Labs: Recent Labs    04/02/21 1124 04/02/21 1147  HGB 14.2  --   HCT 43.0  --   PLT 294  --   APTT  --  29  LABPROT  --  14.3  INR  --  1.1  CREATININE 1.84*  --   TROPONINIHS 19*  --     Estimated Creatinine Clearance: 30.6 mL/min (A) (by C-G formula based on SCr of 1.84 mg/dL (H)).   Medical History: Past Medical History:  Diagnosis Date   (HFpEF) heart failure with preserved ejection fraction ([place])    a.) MPI 12/12/2019 --> EF 42%. b.) TTE 12/17/2019 --> EF 55-60%.   Abdominal aortic atherosclerosis ([place])    a.) MPI 12/12/2019--> very mild.   Anginal pain (HCC)    Chronic anticoagulation    a.) clopidogrel   CKD (chronic kidney disease), stage III (HCC)    Coronary artery disease    Diastolic dysfunction    a.) TTE 12/17/2019 --> LVEF 55-60%; G2DD.   GERD (gastroesophageal reflux disease)    Gout    History of 2019 novel coronavirus disease (COVID-19) 09/10/2020   Hyperlipidemia    Hypertension    Hypothyroidism    Osteoarthritis of left knee    Psoriasis    Recurrent umbilical hernia    a.) repaired 01/01/2020; recurred.   SNHL (sensory-neural hearing loss), asymmetrical    TIA (transient ischemic attack)    Valvular regurgitation    a.) TTE 12/17/2019 --> EF 55-60%; mild AR, TR    Medications:  (Not in a hospital admission)  Scheduled:  Infusions:   magnesium sulfate bolus IVPB     PRN:  Anti-infectives (From admission, onward)    None      No PTA anticoagulation per my chart review.  Assessment: 85YOM presenting with acute onset chest pain. PMH includes CAD,  CKD3, HTN, HLD. Troponins at 19 (x1). EKG with sustained VT, s/p cardioversion.  BL labs WNL - aPTT 29. INR 1.1. Hgb stable  Goal of Therapy:  Heparin level 0.3-0.7 units/ml Monitor platelets by anticoagulation protocol: Yes   Plan:  Give 4,000 units bolus x 1 Start heparin infusion at 950 units/hr Check anti-Xa level in 8 hours and daily while on heparin Daily CBC.   Wynelle Cleveland, PharmD Pharmacy Resident  04/02/2021 12:36 PM

## 2021-04-02 NOTE — Progress Notes (Signed)
ANTICOAGULATION CONSULT NOTE   Pharmacy Consult for IV heparin Indication: chest pain/ACS  Allergies  Allergen Reactions   Aspirin Cough   Lisinopril Itching    Throat itching     Patient Measurements:   Heparin Dosing Weight: 78.9 kg  Vital Signs: Temp: 98 F (36.7 C) (01/21 2014) Temp Source: Oral (01/21 2014) BP: 106/71 (01/21 2014) Pulse Rate: 74 (01/21 2014)  Labs: Recent Labs    04/02/21 1124 04/02/21 1147 04/02/21 1532 04/02/21 2038  HGB 14.2  --   --   --   HCT 43.0  --   --   --   PLT 294  --   --   --   APTT  --  29  --   --   LABPROT  --  14.3  --   --   INR  --  1.1  --   --   HEPARINUNFRC  --   --   --  0.22*  CREATININE 1.84*  --   --   --   TROPONINIHS 19*  --  4,508*  --      Estimated Creatinine Clearance: 30.6 mL/min (A) (by C-G formula based on SCr of 1.84 mg/dL (H)).   Medical History: Past Medical History:  Diagnosis Date   (HFpEF) heart failure with preserved ejection fraction ([place])    a.) MPI 12/12/2019 --> EF 42%. b.) TTE 12/17/2019 --> EF 55-60%.   Abdominal aortic atherosclerosis ([place])    a.) MPI 12/12/2019--> very mild.   Anginal pain (HCC)    Chronic anticoagulation    a.) clopidogrel   CKD (chronic kidney disease), stage III (HCC)    Coronary artery disease    Diastolic dysfunction    a.) TTE 12/17/2019 --> LVEF 55-60%; G2DD.   GERD (gastroesophageal reflux disease)    Gout    History of 2019 novel coronavirus disease (COVID-19) 09/10/2020   Hyperlipidemia    Hypertension    Hypothyroidism    Osteoarthritis of left knee    Psoriasis    Recurrent umbilical hernia    a.) repaired 01/01/2020; recurred.   SNHL (sensory-neural hearing loss), asymmetrical    TIA (transient ischemic attack)    Valvular regurgitation    a.) TTE 12/17/2019 --> EF 55-60%; mild AR, TR    Medications:  Medications Prior to Admission  Medication Sig Dispense Refill Last Dose   acetaminophen (TYLENOL) 500 MG tablet Take 2 tablets (1,000 mg  total) by mouth every 6 (six) hours as needed for mild pain.   Past Week   allopurinol (ZYLOPRIM) 100 MG tablet TAKE 1 TABLET(100 MG) BY MOUTH DAILY 90 tablet 4 04/01/2021   amLODipine (NORVASC) 5 MG tablet Take 1 tablet (5 mg total) by mouth daily. 90 tablet 4 04/02/2021   carvedilol (COREG) 6.25 MG tablet TAKE 1 TABLET(6.25 MG) BY MOUTH TWICE DAILY WITH A MEAL 180 tablet 3 04/02/2021   clopidogrel (PLAVIX) 75 MG tablet Take 1 tablet (75 mg total) by mouth daily. 90 tablet 4 04/02/2021   furosemide (LASIX) 40 MG tablet Take 1 tablet (40 mg total) by mouth daily as needed for edema (weight gain, shortness of breath). 90 tablet 4 04/01/2021   isosorbide mononitrate (IMDUR) 30 MG 24 hr tablet Take by mouth.   04/02/2021   levothyroxine (SYNTHROID) 75 MCG tablet Take 1 tablet (75 mcg total) by mouth daily. 90 tablet 3 04/01/2021   loratadine (CLARITIN) 10 MG tablet Take 1 tablet (10 mg total) by mouth daily. 30 tablet 11 04/01/2021  losartan (COZAAR) 50 MG tablet Take 1 tablet (50 mg total) by mouth daily. 90 tablet 4 04/01/2021   meclizine (ANTIVERT) 12.5 MG tablet Take 1 tablet (12.5 mg total) by mouth 2 (two) times daily as needed for dizziness. 90 tablet 0 04/01/2021   nitroGLYCERIN (NITROSTAT) 0.4 MG SL tablet Place 1 tablet (0.4 mg total) under the tongue every 5 (five) minutes as needed for chest pain (chest pain). Maximum of 3 doses. 25 tablet 1 04/02/2021   pantoprazole (PROTONIX) 40 MG tablet Take 1 tablet (40 mg total) by mouth daily. 90 tablet 4 04/01/2021   rosuvastatin (CRESTOR) 10 MG tablet Take 10 MG (one tablet) by mouth 3 days a week (Monday, Wednesday, Friday). 36 tablet 4 04/01/2021   triamcinolone cream (KENALOG) 0.1 % Apply 1 application topically 2 (two) times daily. 30 g 0 Past Week   VITAMIN D-1000 MAX ST 25 MCG (1000 UT) tablet Take 1,000 Units by mouth daily.   04/01/2021   isosorbide mononitrate (IMDUR) 60 MG 24 hr tablet Take 1.5 tablets (90 mg total) by mouth daily. (Patient not taking:  Reported on 04/02/2021) 135 tablet 1 Not Taking   Scheduled:   allopurinol  100 mg Oral Daily   amLODipine  5 mg Oral Daily   carvedilol  6.25 mg Oral BID WC   [START ON 04/03/2021] cholecalciferol  1,000 Units Oral Daily   clopidogrel  75 mg Oral Daily   insulin aspart  0-15 Units Subcutaneous TID AC & HS   isosorbide mononitrate  90 mg Oral Daily   levothyroxine  75 mcg Oral Daily   [START ON 04/03/2021] loratadine  10 mg Oral Daily   losartan  50 mg Oral Daily   [START ON 04/03/2021] pantoprazole  40 mg Oral Daily   [START ON 04/04/2021] rosuvastatin  10 mg Oral Q M,W,F   [START ON 04/03/2021] triamcinolone cream  1 application Topical BID   Infusions:   sodium chloride 100 mL/hr at 04/02/21 1336   heparin 950 Units/hr (04/02/21 2138)   PRN:  Anti-infectives (From admission, onward)    None      No PTA anticoagulation per my chart review.  Assessment: 85YOM presenting with acute onset chest pain. PMH includes CAD, CKD3, HTN, HLD. Troponins at 19 (x1). EKG with sustained VT, s/p cardioversion.  BL labs WNL - aPTT 29. INR 1.1. Hgb stable  Date Time HL Rate/comment 1/21 2038 0.22 950 un/hr, subthera  Goal of Therapy:  Heparin level 0.3-0.7 units/ml Monitor platelets by anticoagulation protocol: Yes   Plan:  Heparin level 0.22, subtherapeutic Give 1200 units bolus x 1 and increase heparin infusion to 1100 units/hr Check anti-Xa level in 8 hours and daily while on heparin Monitor daily CBC, s/s of bleed   Darnelle Bos, PharmD 04/02/2021 10:24 PM

## 2021-04-02 NOTE — ED Notes (Signed)
Crystal RN aware of assigned bed 

## 2021-04-02 NOTE — ED Notes (Signed)
150j synchronized at this time with conversion to sinus

## 2021-04-02 NOTE — Consult Note (Signed)
Cardiology Consultation:   Patient ID: Gerald Hodges; CX:[id number]; Nov 01, 1935   Admit date: 04/02/2021 Date of Consult: 04/02/2021  Primary Care Provider: Venita Lick, NP Primary Cardiologist: End Primary Electrophysiologist:  None   Patient Profile:   Gerald Hodges is a 86 y.o. male with a hx of CAD based on abnormal myocardial perfusion stress test at [facility] with chronic stable angina, TIA, CKD stage IIIb, COVID, HTN, HLD, dizziness with possible vertigo, and GERD who is being seen today for the evaluation of VT at the request of Dr. Tamala Julian.  History of Present Illness:   [name] underwent Myoview in 10/2016 at [facility] [place] was without significant ischemia with a small in size, subtle in severity, fixed mid inferolateral and basal defect consistent with possible artifact but could not rule out subtle scar, EF 56%, no significant coronary artery calcification. Study was low risk and unchanged when compared to prior in 09/2012. Echo from 11/2017 showed an EF of 55-60%, mild LVH, moderate focal basal hypertrophy, Gr1DD, mild aortic valve thickening with mild regurgitation, normal RV size and function.  He was evaluated in 10/2018 at the [facility] ED with COVID-19 pneumonia.     He was evaluated for preoperative cardiac risk stratification of hernia repair in 11/2019, with recommendation to proceed with Lexiscan MPI to evaluate for high risk ischemia give symptoms.  Subsequent Lexiscan MPI in 12/2019 showed no significant ischemia with a small to moderate-sized region of predominantly fixed defect in the inferolateral wall that was challenging to interpret in the setting of GI uptake artifact, EF 42%.  CT attenuation corrected images with very mild aortic atherosclerosis and no notable coronary artery calcification.  Overall, this was a low risk study.  Echo in 12/2019 showed an EF of 55-60%, no RWMA, mild LVH, Gr2DD, normal RV systolic function and ventricular cavity size,  mild MR, and mild AI.  Based on these studies, he was felt to be acceptable risk for surgery.  He was seen in 05/2020, noting body aches and fatigue, as well as worsening exertional dyspnea and chest pain.  He had been taking Imdur 30 mg, rather than the previously recommended 90 mg.  He preferred to reinitiate Imdur at 90 mg initially, and reserve LHC for refractory symptoms.  With titration of Imdur, symptoms improved.  He was last seen in the office in 09/2020 for repeat cardiac restratification for hernia repair.  He was doing well from a cardiac perspective and felt to be moderate risk for noncardiac surgery without further testing indicated.  His surgery was delayed while he was evaluated for positive QuantiFERON gold.  He subsequently underwent robotic assisted umbilical hernia repair on 12/14/2020.  At his post surgical follow-up with his surgeon on 12/31/2020, he reported intermittent stabbing sensations along his incisions, a small lump above the umbilicus, burning/discomfort going from the upper abdomen to his chest without frank chest pain, as well as dizziness upon getting up in the morning.  There was no evidence of hernia recurrence.  He was advised to try Pepcid or Prilosec for some of his discomfort and follow-up with our office for dizziness.  He was last seen in the office in 01/2021 and was doing reasonably well from a cardiac perspective.  Since initiating Pepcid, his epigastric discomfort has resolved.  He was without symptoms of angina or decompensation.  He noted positional dizziness and a room spinning sensation at that time.  His positional dizziness was worse prior to eating or drinking anything.  As he consumed oral liquids his dizziness improved.  He did report some palpitations and underwent outpatient cardiac monitoring at that time which demonstrated a predominant rhythm of sinus with an average rate of 75 bpm, rare PACs, occasional PVCs representing a 3% burden, a single episode of NSVT  lasting just 7 beats occurred, 11 atrial runs lasting up to 18 beats.  Echo in 02/2021 demonstrated an EF of 60 to 65%, no regional wall motion abnormalities, grade 1 diastolic dysfunction, normal RV systolic function, PASP 123XX123 mmHg, mildly dilated left atrium, mild mitral regurgitation, and an estimated right atrial pressure of 8 mmHg.  He had been in his usual state of health up until around 8 AM on 04/02/2021 when he developed sudden onset of diaphoresis and chest discomfort.  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  He became very pale per family.  He was brought in by private vehicle and was noted to be in sustained VT upon arrival.  ED staff was unable to obtain a BP leading to emergent successful cardioversion at 150 J to sinus rhythm.  Initial high-sensitivity troponin 19.  BNP 179.  Potassium 4.5, BUN 23, serum creatinine 1.84, AST 74, ALT 49.  Magnesium 1.9.  CBC unremarkable.  Chest x-ray demonstrated low lung volumes with pulmonary vascular congestion without overt edema.  Currently, he is without symptoms of chest pain, dyspnea, palpitations, dizziness, or presyncope.  He reports adherence to his medications and has been without complaints leading up to today's events.    Past Medical History:  Diagnosis Date   (HFpEF) heart failure with preserved ejection fraction ([place])    a.) MPI 12/12/2019 --> EF 42%. b.) TTE 12/17/2019 --> EF 55-60%.   Abdominal aortic atherosclerosis ([place])    a.) MPI 12/12/2019--> very mild.   Anginal pain (HCC)    Chronic anticoagulation    a.) clopidogrel   CKD (chronic kidney disease), stage III (HCC)    Coronary artery disease    Diastolic dysfunction    a.) TTE 12/17/2019 --> LVEF 55-60%; G2DD.   GERD (gastroesophageal reflux disease)    Gout    History of 2019 novel coronavirus disease (COVID-19) 09/10/2020   Hyperlipidemia    Hypertension    Hypothyroidism    Osteoarthritis of left knee    Psoriasis    Recurrent  umbilical hernia    a.) repaired 01/01/2020; recurred.   SNHL (sensory-neural hearing loss), asymmetrical    TIA (transient ischemic attack)    Valvular regurgitation    a.) TTE 12/17/2019 --> EF 55-60%; mild AR, TR    Past Surgical History:  Procedure Laterality Date   BUNIONECTOMY Bilateral    CATARACT EXTRACTION W/ INTRAOCULAR LENS  IMPLANT, BILATERAL Bilateral    TOTAL KNEE ARTHROPLASTY Left 10/25/2016   Procedure: TOTAL KNEE ARTHROPLASTY;  Surgeon: Earnestine Leys, MD;  Location: [facility] ORS;  Service: Orthopedics;  Laterality: Left;   UMBILICAL HERNIA REPAIR N/A 01/01/2020   Procedure: HERNIA REPAIR UMBILICAL ADULT;  Surgeon: Olean Ree, MD;  Location: [facility] ORS;  Service: General;  Laterality: N/A;   XI ROBOTIC ASSISTED INGUINAL HERNIA REPAIR WITH MESH Left 01/01/2020   Procedure: XI ROBOTIC ASSISTED INGUINAL HERNIA REPAIR WITH MESH;  Surgeon: Olean Ree, MD;  Location: [facility] ORS;  Service: General;  Laterality: Left;     Home Meds: Prior to Admission medications   Medication Sig Start Date End Date Taking? Authorizing Provider  acetaminophen (TYLENOL) 500 MG tablet Take 2 tablets (1,000 mg total)  by mouth every 6 (six) hours as needed for mild pain. 12/14/20   Piscoya, Jacqulyn Bath, MD  allopurinol (ZYLOPRIM) 100 MG tablet TAKE 1 TABLET(100 MG) BY MOUTH DAILY 07/27/20   Cannady, Jolene T, NP  amLODipine (NORVASC) 5 MG tablet Take 1 tablet (5 mg total) by mouth daily. 07/27/20   Cannady, Henrine Screws T, NP  carvedilol (COREG) 6.25 MG tablet TAKE 1 TABLET(6.25 MG) BY MOUTH TWICE DAILY WITH A MEAL 01/27/21   Cannady, Henrine Screws T, NP  clopidogrel (PLAVIX) 75 MG tablet Take 1 tablet (75 mg total) by mouth daily. 07/27/20   Cannady, Henrine Screws T, NP  furosemide (LASIX) 40 MG tablet Take 1 tablet (40 mg total) by mouth daily as needed for edema (weight gain, shortness of breath). 07/27/20   Cannady, Henrine Screws T, NP  isosorbide mononitrate (IMDUR) 30 MG 24 hr tablet Take by mouth. 03/22/21   [provider]   isosorbide mononitrate (IMDUR) 60 MG 24 hr tablet Take 1.5 tablets (90 mg total) by mouth daily. 01/27/21 04/27/21  Marnee Guarneri T, NP  levothyroxine (SYNTHROID) 75 MCG tablet Take 1 tablet (75 mcg total) by mouth daily. 01/28/21   Cannady, Henrine Screws T, NP  loratadine (CLARITIN) 10 MG tablet Take 1 tablet (10 mg total) by mouth daily. 03/30/21   Cannady, Henrine Screws T, NP  losartan (COZAAR) 50 MG tablet Take 1 tablet (50 mg total) by mouth daily. 01/27/21   Cannady, Henrine Screws T, NP  meclizine (ANTIVERT) 12.5 MG tablet Take 1 tablet (12.5 mg total) by mouth 2 (two) times daily as needed for dizziness. 01/11/21 04/11/21  Rise Mu, PA-C  nitroGLYCERIN (NITROSTAT) 0.4 MG SL tablet Place 1 tablet (0.4 mg total) under the tongue every 5 (five) minutes as needed for chest pain (chest pain). Maximum of 3 doses. 04/11/19   Tehila Sokolow, Areta Haber, PA-C  pantoprazole (PROTONIX) 40 MG tablet Take 1 tablet (40 mg total) by mouth daily. 07/27/20   Marnee Guarneri T, NP  rosuvastatin (CRESTOR) 10 MG tablet Take 10 MG (one tablet) by mouth 3 days a week (Monday, Wednesday, Friday). 07/28/20   Cannady, Henrine Screws T, NP  triamcinolone cream (KENALOG) 0.1 % Apply 1 application topically 2 (two) times daily. 03/30/21   Cannady, Jolene T, NP  VITAMIN D-1000 MAX ST 25 MCG (1000 UT) tablet Take 1,000 Units by mouth daily. 03/22/21   [provider]    Inpatient Medications: Scheduled Meds:  Continuous Infusions:  PRN Meds:   Allergies:   Allergies  Allergen Reactions   Aspirin Cough   Lisinopril Itching    Throat itching     Social History:   Social History   Socioeconomic History   Marital status: Married    Spouse name: Not on file   Number of children: Not on file   Years of education: Not on file   Highest education level: Not on file  Occupational History   Not on file  Tobacco Use   Smoking status: Never   Smokeless tobacco: Never  Vaping Use   Vaping Use: Never used  Substance and Sexual Activity    Alcohol use: No   Drug use: No   Sexual activity: Not Currently  Other Topics Concern   Not on file  Social History Narrative   Not on file   Social Determinants of Health   Financial Resource Strain: Not on file  Food Insecurity: Not on file  Transportation Needs: Not on file  Physical Activity: Not on file  Stress: Not on file  Social  Connections: Not on file  Intimate Partner Violence: Not on file     Family History:   Family History  Problem Relation Age of Onset   Cancer Mother        stomach   Heart disease Neg Hx     ROS:  Review of Systems  Constitutional:  Positive for diaphoresis and malaise/fatigue. Negative for chills, fever and weight loss.  HENT:  Negative for congestion.   Eyes:  Negative for discharge and redness.  Respiratory:  Positive for shortness of breath. Negative for cough, hemoptysis and wheezing.   Cardiovascular:  Positive for chest pain and palpitations. Negative for orthopnea, claudication, leg swelling and PND.  Gastrointestinal:  Negative for abdominal pain, heartburn, nausea and vomiting.  Musculoskeletal:  Negative for falls and myalgias.  Skin:  Negative for rash.  Neurological:  Positive for dizziness and weakness. Negative for tingling, tremors, sensory change, speech change, focal weakness and loss of consciousness.  Endo/Heme/Allergies:  Does not bruise/bleed easily.  Psychiatric/Behavioral:  Negative for substance abuse. The patient is not nervous/anxious.   All other systems reviewed and are negative.    Physical Exam/Data:   Vitals:   04/02/21 1123 04/02/21 1155  BP: (!) 94/57   Pulse: 83   Resp: 14   Temp:  (!) 97.3 F (36.3 C)  TempSrc:  Oral  SpO2: 94%    No intake or output data in the 24 hours ending 04/02/21 1225 There were no vitals filed for this visit. There is no height or weight on file to calculate BMI.   Physical Exam: General: Well developed, well nourished, in no acute distress. Head: Normocephalic,  atraumatic, sclera non-icteric, no xanthomas, nares without discharge.  Neck: Negative for carotid bruits. JVD not elevated. Lungs: Clear bilaterally to auscultation without wheezes, rales, or rhonchi. Breathing is unlabored. Heart: RRR with S1 S2. No murmurs, rubs, or gallops appreciated. Abdomen: Soft, non-tender, non-distended with normoactive bowel sounds. No hepatomegaly. No rebound/guarding. No obvious abdominal masses. Msk:  Strength and tone appear normal for age. Extremities: No clubbing or cyanosis. No edema. Distal pedal pulses are 2+ and equal bilaterally. Neuro: Alert and oriented X 3. No facial asymmetry. No focal deficit. Moves all extremities spontaneously. Psych:  Responds to questions appropriately with a normal affect.   EKG:  The EKG was personally reviewed and demonstrates: Sustained VT, 223 bpm.  Repeat EKG showed sinus rhythm, 84 bpm, first-degree AV block, supraventricular bigeminy, nonspecific ST-T changes Telemetry:  Telemetry was personally reviewed and demonstrates: SR with rare isolated PVCs  Weights: There were no vitals filed for this visit.  Relevant CV Studies:  2D echo 02/15/2021: 1. Left ventricular ejection fraction, by estimation, is 60 to 65%. The  left ventricle has normal function. The left ventricle has no regional  wall motion abnormalities. Left ventricular diastolic parameters are  consistent with Grade I diastolic  dysfunction (impaired relaxation).   2. Right ventricular systolic function is normal. The right ventricular  size is normal. There is normal pulmonary artery systolic pressure. The  estimated right ventricular systolic pressure is 35.7 mmHg.   3. Left atrial size was mildly dilated.   4. The mitral valve is normal in structure. Mild mitral valve  regurgitation. No evidence of mitral stenosis.   5. The aortic valve was not well visualized. Aortic valve regurgitation  is mild. No aortic stenosis is present.   6. The inferior vena  cava is normal in size with <50% respiratory  variability, suggesting right atrial pressure of  8 mmHg. __________  Elwyn Reach patch 01/2021: The patient was monitored for 13 days, 12 hours. The predominant rhythm was sinus with an average rate of 75 bpm (range 51-123 bpm in sinus). There were rare PACs and occasional PVCs (3% burden). A single episode of nonsustained ventricular tachycardia was observed, lasting 7 beats with a maximum rate of 171 bpm. There were 11 atrial runs lasting up to 18 beats, with a maximum rate of 136 bpm. No sustained arrhythmia or prolonged pause was observed. Patient triggered events corresponded to sinus rhythm and PVCs.   Predominantly sinus rhythm with rare PACs and occasional PVCs, as well as a few runs of NSVT and PSVT. __________  2D echo 12/17/2019: 1. Left ventricular ejection fraction, by estimation, is 55 to 60%. The  left ventricle has normal function. The left ventricle has no regional  wall motion abnormalities. There is mild left ventricular hypertrophy.  Left ventricular diastolic parameters  are consistent with Grade II diastolic dysfunction (pseudonormalization).   2. Right ventricular systolic function is normal. The right ventricular  size is normal.   3. The mitral valve is normal in structure. Mild mitral valve  regurgitation.   4. The aortic valve is tricuspid. Aortic valve regurgitation is mild.   5. The inferior vena cava is dilated in size with >50% respiratory  variability, suggesting right atrial pressure of 8 mmHg. __________   Carlton Adam MPI 12/12/2019: Pharmacological myocardial perfusion imaging study with no significant  Ischemia Small to moderate-sized region of predominantly fixed defect in the inferolateral wall , challenging to interpret in the setting of GI uptake artifact Normal wall motion, EF estimated at 42% (possibly depressed secondary to artifact) No EKG changes concerning for ischemia at peak stress or in recovery. CT  attenuation correction images with very mild aortic atherosclerosis, no notable coronary calcification Low risk scan  Laboratory Data:  Chemistry Recent Labs  Lab 04/02/21 1124  NA 134*  K 4.5  CL 103  CO2 18*  GLUCOSE 291*  BUN 23  CREATININE 1.84*  CALCIUM 9.3  GFRNONAA 36*  ANIONGAP 13    Recent Labs  Lab 04/02/21 1124  PROT 7.1  ALBUMIN 3.9  AST 74*  ALT 49*  ALKPHOS 126  BILITOT 1.4*   Hematology Recent Labs  Lab 04/02/21 1124  WBC 7.1  RBC 4.59  HGB 14.2  HCT 43.0  MCV 93.7  MCH 30.9  MCHC 33.0  RDW 14.0  PLT 294   Cardiac EnzymesNo results for input(s): TROPONINI in the last 168 hours. No results for input(s): TROPIPOC in the last 168 hours.  BNP Recent Labs  Lab 04/02/21 1124  BNP 179.6*    DDimer No results for input(s): DDIMER in the last 168 hours.  Radiology/Studies:  DG Chest Portable 1 View  Result Date: 04/02/2021 IMPRESSION: Low lung volumes with pulmonary vascular congestion but no overt edema. Electronically Signed   By: Genevie Ann M.D.   On: 04/02/2021 11:58    Assessment and Plan:   1. Sustained VT: -Status post successful cardioversion at 150 J in the ED -Blood pressure precludes addition of beta-blocker, add if able -Should he have further episodes add amiodarone -N.p.o. at midnight on 1/23 -Plan for LHC on 1/23, per MD -Maintain potassium and magnesium at goal 4.0 and 2.0, respectively -Give IV magnesium sulfate 2 g today -Potassium at goal -Check TSH -Cardiac monitor at bedside -Pads in place  2.  CAD involving the native coronary arteries with elevated troponin: -Currently chest pain-free -Presented  with sustained VT as outlined above -Plan for [facility] on 1/23 -Initial high-sensitivity troponin 19, trend troponin to peak -ASA/Plavix -Heparin drip -Echo -PTA statin  3.  HFpEF -Gentle diuresis as indicated -Obtain echo  4.  HLD: -At last visit he was taking both atorvastatin and rosuvastatin with recommendation to  continue to his rosuvastatin, continue upon admission  5.  CKD stage IIIb: -Monitor   Shared Decision Making/Informed Consent{  The risks [stroke (1 in 1000), death (1 in 1000), kidney failure [usually temporary] (1 in 500), bleeding (1 in 200), allergic reaction [possibly serious] (1 in 200)], benefits (diagnostic support and management of coronary artery disease) and alternatives of a cardiac catheterization were discussed in detail with [name] and he is willing to proceed.    For questions or updates, please contact [place] Please consult [email or web address] for contact info under Cardiology/STEMI.   Signed, Christell Faith, PA-C [place] Pager: [phone] 04/02/2021, 12:25 PM

## 2021-04-02 NOTE — H&P (Signed)
[place]   PATIENT NAME: Gerald Hodges    MR#:  [id number]  DATE OF BIRTH:  1935-06-17  DATE OF ADMISSION:  04/02/2021  PRIMARY CARE PHYSICIAN: Venita Lick, NP   Patient is coming from: Home  REQUESTING/REFERRING PHYSICIAN: Vladimir Crofts, MD  CHIEF COMPLAINT:   Chief Complaint  Patient presents with   Chest Pain  The patient is Spanish-speaking.  His son was offering translation.  HISTORY OF PRESENT ILLNESS:  Gerald Hodges is a 86 y.o. male with medical history significant for HF PEF, stage III CKD, CAD, GERD, gout, hypertension, dyslipidemia and hypothyroidism, who presented to the ER with acute onset of chest pain with associated palpitations and presyncope, pallor and diaphoresis while he was carrying a box of supply to his truck, with mild dyspnea.  He denies any fever or chills.  No nausea or vomiting or abdominal pain.  No syncope, paresthesias or focal muscle weakness or falls. No bleeding diathesis.  No dysuria, oliguria or hematuria or flank pain.   ED Course: When he came to ER blood pressure was 94/57 with heart rate of 221 and he was in ventricular tachycardia and temperature was 97.3.  Labs revealed mild hyponatremia 134 and hyperglycemia of 291 with a creatinine 1 of 1.84 and AST 74 ALT 49, total bili of 1.4 and BNP 179.6 with high-sensitivity troponin I of 19.  CBC was within normal.  Influenza antigens and COVID-19 PCR came back negative.    EKG showed normal sinus rhythm with a rate of 84 with supraventricular bigeminy, low voltage QRS and Q waves inferiorly.  Imaging: Proper chest x-ray showed low lung volumes with pulmonary vascular congestion and no overt edema.  The patient was given a synchronized cardioversion at 250 J after which she returned to sinus rhythm with a rate in the 80s.  Subsequent EKG showed global ischemia with minimal ST segment elevation to aVR and ST depressions with no evidence for STEMI.  Dr. Rockey Situ was  contacted about the patient and the patient was started on IV heparin drip.  PAST MEDICAL HISTORY:   Past Medical History:  Diagnosis Date   (HFpEF) heart failure with preserved ejection fraction ([place])    a.) MPI 12/12/2019 --> EF 42%. b.) TTE 12/17/2019 --> EF 55-60%.   Abdominal aortic atherosclerosis ([place])    a.) MPI 12/12/2019--> very mild.   Anginal pain (HCC)    Chronic anticoagulation    a.) clopidogrel   CKD (chronic kidney disease), stage III (HCC)    Coronary artery disease    Diastolic dysfunction    a.) TTE 12/17/2019 --> LVEF 55-60%; G2DD.   GERD (gastroesophageal reflux disease)    Gout    History of 2019 novel coronavirus disease (COVID-19) 09/10/2020   Hyperlipidemia    Hypertension    Hypothyroidism    Osteoarthritis of left knee    Psoriasis    Recurrent umbilical hernia    a.) repaired 01/01/2020; recurred.   SNHL (sensory-neural hearing loss), asymmetrical    TIA (transient ischemic attack)    Valvular regurgitation    a.) TTE 12/17/2019 --> EF 55-60%; mild AR, TR    PAST SURGICAL HISTORY:   Past Surgical History:  Procedure Laterality Date   BUNIONECTOMY Bilateral    CATARACT EXTRACTION W/ INTRAOCULAR LENS  IMPLANT, BILATERAL Bilateral    TOTAL KNEE ARTHROPLASTY Left 10/25/2016   Procedure: TOTAL KNEE ARTHROPLASTY;  Surgeon: Earnestine Leys, MD;  Location: [facility] ORS;  Service: Orthopedics;  Laterality:  Left;   UMBILICAL HERNIA REPAIR N/A 01/01/2020   Procedure: HERNIA REPAIR UMBILICAL ADULT;  Surgeon: Olean Ree, MD;  Location: [facility] ORS;  Service: General;  Laterality: N/A;   XI ROBOTIC ASSISTED INGUINAL HERNIA REPAIR WITH MESH Left 01/01/2020   Procedure: XI ROBOTIC ASSISTED INGUINAL HERNIA REPAIR WITH MESH;  Surgeon: Olean Ree, MD;  Location: [facility] ORS;  Service: General;  Laterality: Left;    SOCIAL HISTORY:   Social History   Tobacco Use   Smoking status: Never   Smokeless tobacco: Never  Substance Use Topics    Alcohol use: No    FAMILY HISTORY:   Family History  Problem Relation Age of Onset   Cancer Mother        stomach   Heart disease Neg Hx     DRUG ALLERGIES:   Allergies  Allergen Reactions   Aspirin Cough   Lisinopril Itching    Throat itching     REVIEW OF SYSTEMS:   ROS As per history of present illness. All pertinent systems were reviewed above. Constitutional, HEENT, cardiovascular, respiratory, GI, GU, musculoskeletal, neuro, psychiatric, endocrine, integumentary and hematologic systems were reviewed and are otherwise negative/unremarkable except for positive findings mentioned above in the HPI.   MEDICATIONS AT HOME:   Prior to Admission medications   Medication Sig Start Date End Date Taking? Authorizing Provider  acetaminophen (TYLENOL) 500 MG tablet Take 2 tablets (1,000 mg total) by mouth every 6 (six) hours as needed for mild pain. 12/14/20  Yes Piscoya, Jacqulyn Bath, MD  allopurinol (ZYLOPRIM) 100 MG tablet TAKE 1 TABLET(100 MG) BY MOUTH DAILY 07/27/20  Yes Cannady, Jolene T, NP  amLODipine (NORVASC) 5 MG tablet Take 1 tablet (5 mg total) by mouth daily. 07/27/20  Yes Cannady, Jolene T, NP  carvedilol (COREG) 6.25 MG tablet TAKE 1 TABLET(6.25 MG) BY MOUTH TWICE DAILY WITH A MEAL 01/27/21  Yes Cannady, Jolene T, NP  clopidogrel (PLAVIX) 75 MG tablet Take 1 tablet (75 mg total) by mouth daily. 07/27/20  Yes Cannady, Jolene T, NP  furosemide (LASIX) 40 MG tablet Take 1 tablet (40 mg total) by mouth daily as needed for edema (weight gain, shortness of breath). 07/27/20  Yes Cannady, Jolene T, NP  isosorbide mononitrate (IMDUR) 30 MG 24 hr tablet Take by mouth. 03/22/21  Yes [provider]  levothyroxine (SYNTHROID) 75 MCG tablet Take 1 tablet (75 mcg total) by mouth daily. 01/28/21  Yes Cannady, Jolene T, NP  loratadine (CLARITIN) 10 MG tablet Take 1 tablet (10 mg total) by mouth daily. 03/30/21  Yes Cannady, Jolene T, NP  losartan (COZAAR) 50 MG tablet Take 1  tablet (50 mg total) by mouth daily. 01/27/21  Yes Cannady, Henrine Screws T, NP  meclizine (ANTIVERT) 12.5 MG tablet Take 1 tablet (12.5 mg total) by mouth 2 (two) times daily as needed for dizziness. 01/11/21 04/11/21 Yes Dunn, Areta Haber, PA-C  nitroGLYCERIN (NITROSTAT) 0.4 MG SL tablet Place 1 tablet (0.4 mg total) under the tongue every 5 (five) minutes as needed for chest pain (chest pain). Maximum of 3 doses. 04/11/19  Yes Dunn, Areta Haber, PA-C  pantoprazole (PROTONIX) 40 MG tablet Take 1 tablet (40 mg total) by mouth daily. 07/27/20  Yes Cannady, Henrine Screws T, NP  rosuvastatin (CRESTOR) 10 MG tablet Take 10 MG (one tablet) by mouth 3 days a week (Monday, Wednesday, Friday). 07/28/20  Yes Cannady, Jolene T, NP  triamcinolone cream (KENALOG) 0.1 % Apply 1 application topically 2 (two) times daily. 03/30/21  Yes Cannady, Jolene  T, NP  VITAMIN D-1000 MAX ST 25 MCG (1000 UT) tablet Take 1,000 Units by mouth daily. 03/22/21  Yes [provider]  isosorbide mononitrate (IMDUR) 60 MG 24 hr tablet Take 1.5 tablets (90 mg total) by mouth daily. Patient not taking: Reported on 04/02/2021 01/27/21 04/27/21  Marnee Guarneri T, NP      VITAL SIGNS:  Blood pressure 132/80, pulse 79, temperature (!) 97.3 F (36.3 C), temperature source Oral, resp. rate (!) 23, SpO2 97 %.  PHYSICAL EXAMINATION:  Physical Exam  GENERAL:  86 y.o.-year-old Hispanic male patient lying in the bed with no acute distress.  EYES: Pupils equal, round, reactive to light and accommodation. No scleral icterus. Extraocular muscles intact.  HEENT: Head atraumatic, normocephalic. Oropharynx and nasopharynx clear.  NECK:  Supple, no jugular venous distention. No thyroid enlargement, no tenderness.  LUNGS: Normal breath sounds bilaterally, no wheezing, rales,rhonchi or crepitation. No use of accessory muscles of respiration.  CARDIOVASCULAR: Regular rate and rhythm, S1, S2 normal. No murmurs, rubs, or gallops.  ABDOMEN: Soft, nondistended, nontender.  Bowel sounds present. No organomegaly or mass.  EXTREMITIES: No pedal edema, cyanosis, or clubbing.  NEUROLOGIC: Cranial nerves II through XII are intact. Muscle strength 5/5 in all extremities. Sensation intact. Gait not checked.  PSYCHIATRIC: The patient is alert and oriented x 3.  Normal affect and good eye contact. SKIN: No obvious rash, lesion, or ulcer.   LABORATORY PANEL:   CBC Recent Labs  Lab 04/02/21 1124  WBC 7.1  HGB 14.2  HCT 43.0  PLT 294   ------------------------------------------------------------------------------------------------------------------  Chemistries  Recent Labs  Lab 04/02/21 1124  NA 134*  K 4.5  CL 103  CO2 18*  GLUCOSE 291*  BUN 23  CREATININE 1.84*  CALCIUM 9.3  MG 1.9  AST 74*  ALT 49*  ALKPHOS 126  BILITOT 1.4*   ------------------------------------------------------------------------------------------------------------------  Cardiac Enzymes No results for input(s): TROPONINI in the last 168 hours. ------------------------------------------------------------------------------------------------------------------  RADIOLOGY:  DG Chest Portable 1 View  Result Date: 04/02/2021 CLINICAL DATA:  86 year old male ventricular tachycardia status post cardioversion. EXAM: PORTABLE CHEST 1 VIEW COMPARISON:  Chest radiograph 09/15/2020 and earlier. FINDINGS: Portable AP view at levin 39 hours. Lower lung volumes. Pacer pads project over the chest. Mediastinal contours are stable and within normal limits. Pulmonary vascular congestion without overt edema. Chronic blunting of the left costophrenic angle appears stable from last year. No pneumothorax or consolidation identified. No acute osseous abnormality identified. Visualized tracheal air column is within normal limits. IMPRESSION: Low lung volumes with pulmonary vascular congestion but no overt edema. Electronically Signed   By: Genevie Ann M.D.   On: 04/02/2021 11:58      IMPRESSION AND PLAN:    1.  Symptomatic sustained ventricular tachycardia and presyncope status post successful synchronized cardioversion. - The patient be admitted to a PCU bed. - We will continue him on IV heparin. - He will be monitored for any recurrent arrhythmias. - We will place him on as needed sublingual nitroglycerin and morphine sulfate for pain. - Cardiology consult to be obtained. - Dr. Rockey Situ was notified and the patient has been seen.  2.  Coronary artery disease with stable angina. - We will continue beta-blocker therapy, Imdur with Coreg and Plavix as well as statin therapy.  3.  Essential hypertension. - Continue Cozaar and Coreg as well as Norvasc.  4.  Gout. - We will continue allopurinol.  5.  Dyslipidemia. - We will continue statin therapy.  6.  GERD. -  We will continue PPI therapy.  DVT prophylaxis: IV heparin. Code Status: full code. Family Communication:  The plan of care was discussed in details with the patient (and his son). I answered all questions. The patient agreed to proceed with the above mentioned plan. Further management will depend upon hospital course. Disposition Plan: Back to previous home environment Consults called: Cardiology. All the records are reviewed and case discussed with ED provider.  Status is: Inpatient   At the time of the admission, it appears that the appropriate admission status for this patient is inpatient.  This is judged to be reasonable and necessary in order to provide the required intensity of service to ensure the patient's safety given the presenting symptoms, physical exam findings and initial radiographic and laboratory data in the context of comorbid conditions.  The patient requires inpatient status due to high intensity of service, high risk of further deterioration and high frequency of surveillance required.  I certify that at the time of admission, it is my clinical judgment that the patient will require inpatient hospital  care extending more than 2 midnights.                            Dispo: The patient is from: Home              Anticipated d/c is to: Home              Patient currently is not medically stable to d/c.              Difficult to place patient: No  Christel Mormon M.D on 04/02/2021 at 1:25 PM  [facility]   From 7 PM-7 AM, contact night-coverage [email or web address]  CC: Primary care physician; Venita Lick, NP

## 2021-04-02 NOTE — ED Provider Notes (Signed)
[facility] Provider Note    Event Date/Time   First MD Initiated Contact with Patient 04/02/21 1120     (approximate)   History   Chest Pain   HPI  Gerald Hodges is a 86 y.o. male who presents to the ED for evaluation of Chest Pain   I review outpatient cardiology visit from November 1.  [facility].  History of CAD, CKD 3, HTN and HLD.  Patient presents to the ED via POV for evaluation of chest discomfort.  History is provided by family the bedside as patient is unable to provide any himself.  They report that patient was in his typical state of health until developing chest discomfort and pale skin while carrying a box of supplies to his truck.  No reported syncopal episodes, emesis or falls.   Physical Exam   Triage Vital Signs: ED Triage Vitals [04/02/21 1123]  Enc Vitals Group     BP (!) 94/57     Pulse Rate 83     Resp 14     Temp      Temp src      SpO2 94 %     Weight      Height      Head Circumference      Peak Flow      Pain Score      Pain Loc      Pain Edu?      Excl. in [place]?     Most recent vital signs: Vitals:   04/02/21 1123 04/02/21 1155  BP: (!) 94/57   Pulse: 83   Resp: 14   Temp:  (!) 97.3 F (36.3 C)  SpO2: 94%     General: Awake, obviously uncomfortable.  Moaning in pain and grabbing his chest. After cardioversion and return to sinus rhythm, he is more calm and comfortable, following commands in all 4. CV:  Initially pale and unwell, but with warm extremities.  Color improves after cardioversion and return to sinus rhythm. Resp:  Normal effort.  Abd:  No distention.  MSK:  No deformity noted.  Neuro:  No focal deficits appreciated. Other:     ED Results / Procedures / Treatments   Labs (all labs ordered are listed, but only abnormal results are displayed) Labs Reviewed  COMPREHENSIVE METABOLIC PANEL - Abnormal; Notable for the following components:      Result Value   Sodium 134  (*)    CO2 18 (*)    Glucose, Bld 291 (*)    Creatinine, Ser 1.84 (*)    AST 74 (*)    ALT 49 (*)    Total Bilirubin 1.4 (*)    GFR, Estimated 36 (*)    All other components within normal limits  CBC WITH DIFFERENTIAL/PLATELET - Abnormal; Notable for the following components:   Lymphs Abs 4.5 (*)    All other components within normal limits  BRAIN NATRIURETIC PEPTIDE - Abnormal; Notable for the following components:   B Natriuretic Peptide 179.6 (*)    All other components within normal limits  TROPONIN I (HIGH SENSITIVITY) - Abnormal; Notable for the following components:   Troponin I (High Sensitivity) 19 (*)    All other components within normal limits  RESP PANEL BY RT-PCR (FLU A&B, COVID) ARPGX2  MAGNESIUM  PROTIME-INR  APTT    EKG  Initial EKG demonstrates a wide-complex tachycardia with a rate of 223 bpm.  Consistent with V. tach.  Monomorphic.  After  cardioversion, sinus rhythm with a rate of 84 bpm.  Normal axis.  First-degree AV block with prolonged PR interval of 261 ms.  Some evidence of global ischemia with submillimeter aVR ST elevations and widespread ST depressions.  No STEMI criteria  RADIOLOGY 1 view CXR reviewed by me with pulmonary vascular congestion  Official radiology report(s): DG Chest Portable 1 View  Result Date: 04/02/2021 CLINICAL DATA:  86 year old male ventricular tachycardia status post cardioversion. EXAM: PORTABLE CHEST 1 VIEW COMPARISON:  Chest radiograph 09/15/2020 and earlier. FINDINGS: Portable AP view at levin 39 hours. Lower lung volumes. Pacer pads project over the chest. Mediastinal contours are stable and within normal limits. Pulmonary vascular congestion without overt edema. Chronic blunting of the left costophrenic angle appears stable from last year. No pneumothorax or consolidation identified. No acute osseous abnormality identified. Visualized tracheal air column is within normal limits. IMPRESSION: Low lung volumes with pulmonary  vascular congestion but no overt edema. Electronically Signed   By: Genevie Ann M.D.   On: 04/02/2021 11:58    PROCEDURES and INTERVENTIONS:  .1-3 Lead EKG Interpretation Performed by: Vladimir Crofts, MD Authorized by: Vladimir Crofts, MD     Interpretation: normal     ECG rate:  80   ECG rate assessment: normal     Rhythm: sinus rhythm     Ectopy: none     Conduction: normal   Comments:     After synchronized cardioversion .Critical Care Performed by: Vladimir Crofts, MD Authorized by: Vladimir Crofts, MD   Critical care provider statement:    Critical care time (minutes):  30   Critical care time was exclusive of:  Separately billable procedures and treating other patients   Critical care was necessary to treat or prevent imminent or life-threatening deterioration of the following conditions:  Cardiac failure and circulatory failure   Critical care was time spent personally by me on the following activities:  Development of treatment plan with patient or surrogate, discussions with consultants, evaluation of patient's response to treatment, examination of patient, ordering and review of laboratory studies, ordering and review of radiographic studies, ordering and performing treatments and interventions, pulse oximetry, re-evaluation of patient's condition and review of old charts .Cardioversion  Date/Time: 04/02/2021 12:11 PM Performed by: Vladimir Crofts, MD Authorized by: Vladimir Crofts, MD   Consent:    Consent obtained:  Emergent situation Pre-procedure details:    Cardioversion basis:  Emergent   Rhythm:  Ventricular tachycardia   Electrode placement:  Anterior-posterior Patient sedated: No Attempt one:    Cardioversion mode:  Synchronous   Shock (Joules):  150   Shock outcome:  Conversion to normal sinus rhythm Post-procedure details:    Patient status:  Awake   Patient tolerance of procedure:  Tolerated well, no immediate complications  Medications - No data to  display   IMPRESSION / MDM / [place] / ED COURSE  I reviewed the triage vital signs and the nursing notes.  86 year old male with history of coronary disease presents to the ED with chest discomfort and monomorphic V. tach requiring emergent synchronized cardioversion prior to medical admission.  He presents with monomorphic V. tach with rates in the 220s, appears unwell and we cannot get a blood pressure.  Therefore performed synchronized cardioversion at 1 to 50 J x 1, with good return to normal sinus rhythm.  Subsequent EKG without STEMI criteria, but has some signs of ischemia.  We will monitor closely.  Discussed with cardiology, who agrees with medical admission for  ischemic work-up.  Blood work with CKD around baseline and troponin is only marginally elevated.  CBC is unremarkable and electrolytes are normal.  BNP is slightly elevated and his CXR somewhat congested, but he is not grossly volume overloaded or in distress.  We will consult with hospitalist for admission with cardiology consulting.   Clinical Course as of 04/02/21 1211  Sat Apr 02, 2021  1131 After synchronized cardioversion at 150 J, he returned to a sinus rhythm with rates in the 80s.  Subsequent EKG with signs of global ischemia with minimal ST elevations to aVR with global depressions.  Not quite STEMI criteria.  Cardiology paged. [DS]  [id number] I chat with Dr. Rockey Situ, who's reviewing chart and hx. Obviously admit. Consider heparin if enzymes are bumped [DS]  60 Dr. Rockey Situ in the ED [DS]    Clinical Course User Index [DS] Vladimir Crofts, MD     FINAL CLINICAL IMPRESSION(S) / ED DIAGNOSES   Final diagnoses:  Ventricular tachyarrhythmia  Other chest pain     Rx / DC Orders   ED Discharge Orders     None        Note:  This document was prepared using Dragon voice recognition software and may include unintentional dictation errors.   Vladimir Crofts, MD 04/02/21 [phone]

## 2021-04-02 NOTE — ED Notes (Addendum)
Pt diaphoretic and altered at this time. Pt in VT, Zoll pads placed, fluids initiated. Unable to obtain BP reading X2.

## 2021-04-02 NOTE — ED Triage Notes (Signed)
Pt via POV from home. Per family, pt woke up this AM, pt was outside and all of sudden pt became diaphoretic and c/o of chest pain. Pt is moaning.

## 2021-04-03 ENCOUNTER — Inpatient Hospital Stay (HOSPITAL_COMMUNITY)
Admit: 2021-04-03 | Discharge: 2021-04-03 | Disposition: A | Discharge status: Home / Self Care | Payer: Medicaid Other | Attending: Physician Assistant | Admitting: Physician Assistant

## 2021-04-03 DIAGNOSIS — N179 Acute kidney failure, unspecified: Secondary | ICD-10-CM | POA: Diagnosis not present

## 2021-04-03 DIAGNOSIS — I5032 Chronic diastolic (congestive) heart failure: Secondary | ICD-10-CM

## 2021-04-03 DIAGNOSIS — I214 Non-ST elevation (NSTEMI) myocardial infarction: Secondary | ICD-10-CM | POA: Diagnosis not present

## 2021-04-03 DIAGNOSIS — I25118 Atherosclerotic heart disease of native coronary artery with other forms of angina pectoris: Secondary | ICD-10-CM | POA: Diagnosis not present

## 2021-04-03 DIAGNOSIS — I208 Other forms of angina pectoris: Secondary | ICD-10-CM

## 2021-04-03 DIAGNOSIS — I472 Ventricular tachycardia, unspecified: Secondary | ICD-10-CM | POA: Diagnosis not present

## 2021-04-03 DIAGNOSIS — I9589 Other hypotension: Secondary | ICD-10-CM | POA: Diagnosis not present

## 2021-04-03 DIAGNOSIS — R748 Abnormal levels of other serum enzymes: Secondary | ICD-10-CM

## 2021-04-03 LAB — ECHOCARDIOGRAM COMPLETE
AV Peak grad: 12.3 mmHg
Ao pk vel: 1.75 m/s
Area-P 1/2: 4.65 cm2
S' Lateral: 3.4 cm

## 2021-04-03 LAB — HEPATIC FUNCTION PANEL
ALT: 45 U/L — ABNORMAL HIGH (ref 0–44)
AST: 54 U/L — ABNORMAL HIGH (ref 15–41)
Albumin: 3.7 g/dL (ref 3.5–5.0)
Alkaline Phosphatase: 89 U/L (ref 38–126)
Bilirubin, Direct: 0.3 mg/dL — ABNORMAL HIGH (ref 0.0–0.2)
Indirect Bilirubin: 1 mg/dL — ABNORMAL HIGH (ref 0.3–0.9)
Total Bilirubin: 1.3 mg/dL — ABNORMAL HIGH (ref 0.3–1.2)
Total Protein: 6.3 g/dL — ABNORMAL LOW (ref 6.5–8.1)

## 2021-04-03 LAB — BASIC METABOLIC PANEL
Anion gap: 6 (ref 5–15)
BUN: 21 mg/dL (ref 8–23)
CO2: 21 mmol/L — ABNORMAL LOW (ref 22–32)
Calcium: 8.4 mg/dL — ABNORMAL LOW (ref 8.9–10.3)
Chloride: 108 mmol/L (ref 98–111)
Creatinine, Ser: 1.3 mg/dL — ABNORMAL HIGH (ref 0.61–1.24)
GFR, Estimated: 55 mL/min — ABNORMAL LOW (ref >60)
Glucose, Bld: 91 mg/dL (ref 70–99)
Potassium: 4.4 mmol/L (ref 3.5–5.1)
Sodium: 135 mmol/L (ref 135–145)

## 2021-04-03 LAB — CBC
HCT: 36.2 % — ABNORMAL LOW (ref 39.0–52.0)
Hemoglobin: 12.2 g/dL — ABNORMAL LOW (ref 13.0–17.0)
MCH: 30.5 pg (ref 26.0–34.0)
MCHC: 33.7 g/dL (ref 30.0–36.0)
MCV: 90.5 fL (ref 80.0–100.0)
Platelets: 216 10*3/uL (ref 150–400)
RBC: 4 MIL/uL — ABNORMAL LOW (ref 4.22–5.81)
RDW: 13.9 % (ref 11.5–15.5)
WBC: 5.8 10*3/uL (ref 4.0–10.5)
nRBC: 0 % (ref 0.0–0.2)

## 2021-04-03 LAB — GLUCOSE, CAPILLARY
Glucose-Capillary: 95 mg/dL (ref 70–99)
Glucose-Capillary: 103 mg/dL — ABNORMAL HIGH (ref 70–99)
Glucose-Capillary: 130 mg/dL — ABNORMAL HIGH (ref 70–99)

## 2021-04-03 LAB — TROPONIN I (HIGH SENSITIVITY)
Troponin I (High Sensitivity): 3646 ng/L (ref <18)
Troponin I (High Sensitivity): 2661 ng/L (ref <18)

## 2021-04-03 LAB — HEPARIN LEVEL (UNFRACTIONATED)
Heparin Unfractionated: 0.46 IU/mL (ref 0.30–0.70)
Heparin Unfractionated: 0.33 IU/mL (ref 0.30–0.70)

## 2021-04-03 LAB — MAGNESIUM: Magnesium: 1.9 mg/dL (ref 1.7–2.4)

## 2021-04-03 LAB — CK: Total CK: 194 U/L (ref 49–397)

## 2021-04-03 MED ORDER — SODIUM CHLORIDE 0.9 % WEIGHT BASED INFUSION
3.0000 mL/kg/h | INTRAVENOUS | Status: DC
  Administered 2021-04-04: 3 mL/kg/h via INTRAVENOUS

## 2021-04-03 MED ORDER — INSULIN ASPART 100 UNIT/ML IJ SOLN
0.0000 [IU] | Freq: Three times a day (TID) | INTRAMUSCULAR | Status: DC
  Administered 2021-04-03: 1 [IU] via SUBCUTANEOUS
  Administered 2021-04-05: 18:00:00 2 [IU] via SUBCUTANEOUS
  Filled 2021-04-03 (×2): qty 1

## 2021-04-03 MED ORDER — SODIUM CHLORIDE 0.9 % WEIGHT BASED INFUSION
1.0000 mL/kg/h | INTRAVENOUS | Status: DC
  Administered 2021-04-04: 1 mL/kg/h via INTRAVENOUS

## 2021-04-03 MED ORDER — SODIUM CHLORIDE 0.9% FLUSH: 3.0000 mL | INTRAVENOUS | Status: DC | PRN

## 2021-04-03 MED ORDER — ASPIRIN 81 MG PO CHEW
81.0000 mg | CHEWABLE_TABLET | ORAL | Status: AC
  Administered 2021-04-04: 81 mg via ORAL
  Filled 2021-04-03: qty 1

## 2021-04-03 MED ORDER — SODIUM CHLORIDE 0.9 % IV SOLN: 250.0000 mL | INTRAVENOUS | Status: DC | PRN

## 2021-04-03 NOTE — Progress Notes (Signed)
Echocardiogram completed Signed  Livia Snellen

## 2021-04-03 NOTE — Progress Notes (Signed)
ANTICOAGULATION CONSULT NOTE   Pharmacy Consult for IV heparin Indication: chest pain/ACS  Allergies  Allergen Reactions   Aspirin Cough   Lisinopril Itching    Throat itching     Patient Measurements:   Heparin Dosing Weight: 78.9 kg  Vital Signs: Temp: 98.9 F (37.2 C) (01/22 0754) Temp Source: Oral (01/22 0754) BP: 106/62 (01/22 0754) Pulse Rate: 67 (01/22 0754)  Labs: Recent Labs    04/02/21 1124 04/02/21 1147 04/02/21 1532 04/02/21 2038 04/03/21 0706  HGB 14.2  --   --   --  12.2*  HCT 43.0  --   --   --  36.2*  PLT 294  --   --   --  216  APTT  --  29  --   --   --   LABPROT  --  14.3  --   --   --   INR  --  1.1  --   --   --   HEPARINUNFRC  --   --   --  0.22* 0.46  CREATININE 1.84*  --   --   --  1.30*  TROPONINIHS 19*  --  4,508*  --   --      Estimated Creatinine Clearance: 43.3 mL/min (A) (by C-G formula based on SCr of 1.3 mg/dL (H)).   Medical History: Past Medical History:  Diagnosis Date   (HFpEF) heart failure with preserved ejection fraction ([place])    a.) MPI 12/12/2019 --> EF 42%. b.) TTE 12/17/2019 --> EF 55-60%.   Abdominal aortic atherosclerosis ([place])    a.) MPI 12/12/2019--> very mild.   Anginal pain (HCC)    Chronic anticoagulation    a.) clopidogrel   CKD (chronic kidney disease), stage III (HCC)    Coronary artery disease    Diastolic dysfunction    a.) TTE 12/17/2019 --> LVEF 55-60%; G2DD.   GERD (gastroesophageal reflux disease)    Gout    History of 2019 novel coronavirus disease (COVID-19) 09/10/2020   Hyperlipidemia    Hypertension    Hypothyroidism    Osteoarthritis of left knee    Psoriasis    Recurrent umbilical hernia    a.) repaired 01/01/2020; recurred.   SNHL (sensory-neural hearing loss), asymmetrical    TIA (transient ischemic attack)    Valvular regurgitation    a.) TTE 12/17/2019 --> EF 55-60%; mild AR, TR     No PTA anticoagulation per my chart review.  Assessment: 85YOM presenting with acute  onset chest pain. PMH includes CAD, CKD3, HTN, HLD. Troponins at 19 (x1). EKG with sustained VT, s/p cardioversion.  BL labs WNL - aPTT 29. INR 1.1. Hgb stable  Date Time HL Rate/comment 1/21 2038 0.22 950 un/hr, subtherapeutic 1/22 0706 0.46 1100 un/hr, therapeutic  Goal of Therapy:  Heparin level 0.3-0.7 units/ml Monitor platelets by anticoagulation protocol: Yes   Plan:  --Heparin level therapeutic --Continue heparin infusion at 1100 units/hr --Recheck HL at 1500 to confirm --Monitor daily CBC, s/s of bleed   Tawnya Crook, PharmD, BCPS Clinical Pharmacist 04/03/2021 8:06 AM

## 2021-04-03 NOTE — H&P (View-Only) (Signed)
Progress Note  Patient Name: Gerald Hodges Date of Encounter: 04/03/2021  [facility] Cardiologist: Nelva Bush, MD   Subjective   No further events overnight, telemetry with rare PVCs Denies any chest pain or shortness of breath, laying in supine position Family at the bedside, discussed recent events   Inpatient Medications    Scheduled Meds:  allopurinol  100 mg Oral Daily   carvedilol  6.25 mg Oral BID WC   cholecalciferol  1,000 Units Oral Daily   clopidogrel  75 mg Oral Daily   insulin aspart  0-9 Units Subcutaneous TID WC   isosorbide mononitrate  90 mg Oral Daily   levothyroxine  75 mcg Oral Daily   loratadine  10 mg Oral Daily   losartan  50 mg Oral Daily   pantoprazole  40 mg Oral Daily   [START ON 04/04/2021] rosuvastatin  10 mg Oral Q M,W,F   triamcinolone cream  1 application Topical BID   Continuous Infusions:  heparin 1,100 Units/hr (04/03/21 1002)   PRN Meds: acetaminophen **OR** acetaminophen, magnesium hydroxide, meclizine, nitroGLYCERIN, [DISCONTINUED] ondansetron **OR** ondansetron (ZOFRAN) IV   Vital Signs    Vitals:   04/02/21 2346 04/03/21 0515 04/03/21 0754 04/03/21 1116  BP: (!) 155/137 102/65 106/62 128/78  Pulse: 71 66 67 69  Resp: 20 18 14 16   Temp: (!) 97.5 F (36.4 C) 98.2 F (36.8 C) 98.9 F (37.2 C) 98.2 F (36.8 C)  TempSrc:   Oral Oral  SpO2: 97% 96% 96% 97%    Intake/Output Summary (Last 24 hours) at 04/03/2021 1350 Last data filed at 04/03/2021 1036 Gross per 24 hour  Intake 3551.77 ml  Output 1725 ml  Net 1826.77 ml   Last 3 Weights 03/30/2021 01/27/2021 01/11/2021  Weight (lbs) 192 lb 187 lb 3.2 oz 185 lb  Weight (kg) 87.091 kg 84.913 kg 83.915 kg      Telemetry    nsr - Personally Reviewed  ECG     - Personally Reviewed  Physical Exam   GEN: No acute distress.   Neck: No JVD Cardiac: RRR, no murmurs, rubs, or gallops.  Respiratory: Clear to auscultation bilaterally. GI: Soft,  nontender, non-distended  MS: No edema; No deformity. Neuro:  Nonfocal  Psych: Normal affect   Labs    High Sensitivity Troponin:   Recent Labs  Lab 04/02/21 1124 04/02/21 1532 04/03/21 0706 04/03/21 0939  TROPONINIHS 19* 4,508* 3,646* 2,661*     Chemistry Recent Labs  Lab 04/02/21 1124 04/03/21 0706  NA 134* 135  K 4.5 4.4  CL 103 108  CO2 18* 21*  GLUCOSE 291* 91  BUN 23 21  CREATININE 1.84* 1.30*  CALCIUM 9.3 8.4*  MG 1.9 1.9  PROT 7.1 6.3*  ALBUMIN 3.9 3.7  AST 74* 54*  ALT 49* 45*  ALKPHOS 126 89  BILITOT 1.4* 1.3*  GFRNONAA 36* 55*  ANIONGAP 13 6    Lipids No results for input(s): CHOL, TRIG, HDL, LABVLDL, LDLCALC, CHOLHDL in the last 168 hours.  Hematology Recent Labs  Lab 04/02/21 1124 04/03/21 0706  WBC 7.1 5.8  RBC 4.59 4.00*  HGB 14.2 12.2*  HCT 43.0 36.2*  MCV 93.7 90.5  MCH 30.9 30.5  MCHC 33.0 33.7  RDW 14.0 13.9  PLT 294 216   Thyroid  Recent Labs  Lab 03/30/21 1612 04/02/21 1239  TSH 3.860 11.193*  FREET4 1.27  --     BNP Recent Labs  Lab 04/02/21 1124  BNP 179.6*  DDimer No results for input(s): DDIMER in the last 168 hours.   Radiology    DG Chest Portable 1 View  Result Date: 04/02/2021 CLINICAL DATA:  86 year old male ventricular tachycardia status post cardioversion. EXAM: PORTABLE CHEST 1 VIEW COMPARISON:  Chest radiograph 09/15/2020 and earlier. FINDINGS: Portable AP view at levin 39 hours. Lower lung volumes. Pacer pads project over the chest. Mediastinal contours are stable and within normal limits. Pulmonary vascular congestion without overt edema. Chronic blunting of the left costophrenic angle appears stable from last year. No pneumothorax or consolidation identified. No acute osseous abnormality identified. Visualized tracheal air column is within normal limits. IMPRESSION: Low lung volumes with pulmonary vascular congestion but no overt edema. Electronically Signed   By: Genevie Ann M.D.   On: 04/02/2021 11:58     Cardiac Studies   Echocardiogram pending   Patient Profile     Gerald Hodges is a 86 y.o. male with a hx of CAD, chronic stable angina, TIA, chronic kidney disease, hypertension hyperlipidemia presenting with chest pain, near syncope, tachycardia noted to be in sustained VT in the emergency room with angina, hemodynamic instability requiring cardioversion  Assessment & Plan    Sustained VT with near syncope With associated diaphoresis, chest pain, profound hypotension Etiology unclear, ischemic work-up pending with catheterization scheduled tomorrow morning -Echocardiogram results pending On arrival with electrolytes within normal range, He does report anginal symptoms periodically as outpatient and has been taking nitro for quite some time -He would likely benefit from EP consultation while inpatient -If felt to be nonischemic, may benefit from EP study/ICD, possibly even with initiation of antiarrhythmics/amiodarone   Coronary disease with stable angina Known coronary artery disease Now presenting with sustained VT, hemodynamic instability, Catheterization scheduled tomorrow morning second case I have reviewed the risks, indications, and alternatives to cardiac catheterization, possible angioplasty, and stenting with the patient. Risks include but are not limited to bleeding, infection, vascular injury, stroke, myocardial infection, arrhythmia, kidney injury, radiation-related injury in the case of prolonged fluoroscopy use, emergency cardiac surgery, and death. The patient understands the risks of serious complication is 1-2 in 123XX123 with diagnostic cardiac cath and 1-2% or less with angioplasty/stenting.  Discussed with family, he is in agreement  Chronic kidney disease stage IIIb Creatinine 1.3, at baseline   Hyperlipidemia Appears he was taking both Lipitor and Crestor, Has elevated LFTs, possibly secondary to events above Recommendation to continue Crestor  alone   Total encounter time more than 35 minutes  Greater than 50% was spent in counseling and coordination of care with the patient  For questions or updates, please contact [place] Please consult [email or web address] for contact info under        Signed, Ida Rogue, MD  04/03/2021, 1:50 PM

## 2021-04-03 NOTE — Progress Notes (Signed)
Progress Note  Patient Name: Gerald Hodges Date of Encounter: 04/03/2021  [facility] Cardiologist: Nelva Bush, MD   Subjective   No further events overnight, telemetry with rare PVCs Denies any chest pain or shortness of breath, laying in supine position Family at the bedside, discussed recent events   Inpatient Medications    Scheduled Meds:  allopurinol  100 mg Oral Daily   carvedilol  6.25 mg Oral BID WC   cholecalciferol  1,000 Units Oral Daily   clopidogrel  75 mg Oral Daily   insulin aspart  0-9 Units Subcutaneous TID WC   isosorbide mononitrate  90 mg Oral Daily   levothyroxine  75 mcg Oral Daily   loratadine  10 mg Oral Daily   losartan  50 mg Oral Daily   pantoprazole  40 mg Oral Daily   [START ON 04/04/2021] rosuvastatin  10 mg Oral Q M,W,F   triamcinolone cream  1 application Topical BID   Continuous Infusions:  heparin 1,100 Units/hr (04/03/21 1002)   PRN Meds: acetaminophen **OR** acetaminophen, magnesium hydroxide, meclizine, nitroGLYCERIN, [DISCONTINUED] ondansetron **OR** ondansetron (ZOFRAN) IV   Vital Signs    Vitals:   04/02/21 2346 04/03/21 0515 04/03/21 0754 04/03/21 1116  BP: (!) 155/137 102/65 106/62 128/78  Pulse: 71 66 67 69  Resp: 20 18 14 16   Temp: (!) 97.5 F (36.4 C) 98.2 F (36.8 C) 98.9 F (37.2 C) 98.2 F (36.8 C)  TempSrc:   Oral Oral  SpO2: 97% 96% 96% 97%    Intake/Output Summary (Last 24 hours) at 04/03/2021 1350 Last data filed at 04/03/2021 1036 Gross per 24 hour  Intake 3551.77 ml  Output 1725 ml  Net 1826.77 ml   Last 3 Weights 03/30/2021 01/27/2021 01/11/2021  Weight (lbs) 192 lb 187 lb 3.2 oz 185 lb  Weight (kg) 87.091 kg 84.913 kg 83.915 kg      Telemetry    nsr - Personally Reviewed  ECG     - Personally Reviewed  Physical Exam   GEN: No acute distress.   Neck: No JVD Cardiac: RRR, no murmurs, rubs, or gallops.  Respiratory: Clear to auscultation bilaterally. GI: Soft,  nontender, non-distended  MS: No edema; No deformity. Neuro:  Nonfocal  Psych: Normal affect   Labs    High Sensitivity Troponin:   Recent Labs  Lab 04/02/21 1124 04/02/21 1532 04/03/21 0706 04/03/21 0939  TROPONINIHS 19* 4,508* 3,646* 2,661*     Chemistry Recent Labs  Lab 04/02/21 1124 04/03/21 0706  NA 134* 135  K 4.5 4.4  CL 103 108  CO2 18* 21*  GLUCOSE 291* 91  BUN 23 21  CREATININE 1.84* 1.30*  CALCIUM 9.3 8.4*  MG 1.9 1.9  PROT 7.1 6.3*  ALBUMIN 3.9 3.7  AST 74* 54*  ALT 49* 45*  ALKPHOS 126 89  BILITOT 1.4* 1.3*  GFRNONAA 36* 55*  ANIONGAP 13 6    Lipids No results for input(s): CHOL, TRIG, HDL, LABVLDL, LDLCALC, CHOLHDL in the last 168 hours.  Hematology Recent Labs  Lab 04/02/21 1124 04/03/21 0706  WBC 7.1 5.8  RBC 4.59 4.00*  HGB 14.2 12.2*  HCT 43.0 36.2*  MCV 93.7 90.5  MCH 30.9 30.5  MCHC 33.0 33.7  RDW 14.0 13.9  PLT 294 216   Thyroid  Recent Labs  Lab 03/30/21 1612 04/02/21 1239  TSH 3.860 11.193*  FREET4 1.27  --     BNP Recent Labs  Lab 04/02/21 1124  BNP 179.6*  DDimer No results for input(s): DDIMER in the last 168 hours.   Radiology    DG Chest Portable 1 View  Result Date: 04/02/2021 CLINICAL DATA:  86 year old male ventricular tachycardia status post cardioversion. EXAM: PORTABLE CHEST 1 VIEW COMPARISON:  Chest radiograph 09/15/2020 and earlier. FINDINGS: Portable AP view at levin 39 hours. Lower lung volumes. Pacer pads project over the chest. Mediastinal contours are stable and within normal limits. Pulmonary vascular congestion without overt edema. Chronic blunting of the left costophrenic angle appears stable from last year. No pneumothorax or consolidation identified. No acute osseous abnormality identified. Visualized tracheal air column is within normal limits. IMPRESSION: Low lung volumes with pulmonary vascular congestion but no overt edema. Electronically Signed   By: Genevie Ann M.D.   On: 04/02/2021 11:58     Cardiac Studies   Echocardiogram pending   Patient Profile     Harshith Costigan is a 86 y.o. male with a hx of CAD, chronic stable angina, TIA, chronic kidney disease, hypertension hyperlipidemia presenting with chest pain, near syncope, tachycardia noted to be in sustained VT in the emergency room with angina, hemodynamic instability requiring cardioversion  Assessment & Plan    Sustained VT with near syncope With associated diaphoresis, chest pain, profound hypotension Etiology unclear, ischemic work-up pending with catheterization scheduled tomorrow morning -Echocardiogram results pending On arrival with electrolytes within normal range, He does report anginal symptoms periodically as outpatient and has been taking nitro for quite some time -He would likely benefit from EP consultation while inpatient -If felt to be nonischemic, may benefit from EP study/ICD, possibly even with initiation of antiarrhythmics/amiodarone   Coronary disease with stable angina Known coronary artery disease Now presenting with sustained VT, hemodynamic instability, Catheterization scheduled tomorrow morning second case I have reviewed the risks, indications, and alternatives to cardiac catheterization, possible angioplasty, and stenting with the patient. Risks include but are not limited to bleeding, infection, vascular injury, stroke, myocardial infection, arrhythmia, kidney injury, radiation-related injury in the case of prolonged fluoroscopy use, emergency cardiac surgery, and death. The patient understands the risks of serious complication is 1-2 in 123XX123 with diagnostic cardiac cath and 1-2% or less with angioplasty/stenting.  Discussed with family, he is in agreement  Chronic kidney disease stage IIIb Creatinine 1.3, at baseline   Hyperlipidemia Appears he was taking both Lipitor and Crestor, Has elevated LFTs, possibly secondary to events above Recommendation to continue Crestor  alone   Total encounter time more than 35 minutes  Greater than 50% was spent in counseling and coordination of care with the patient  For questions or updates, please contact [place] Please consult [email or web address] for contact info under        Signed, Ida Rogue, MD  04/03/2021, 1:50 PM

## 2021-04-03 NOTE — TOC CM/SW Note (Signed)
°  Transition of Care [facility]) Screening Note   Patient Details  Name: Gerald Hodges Date of Birth: Jul 23, 1935   Transition of Care [facility]) CM/SW Contact:    Liliana Cline, LCSW Phone Number: 04/03/2021, 2:39 PM    Transition of Care Department [facility]) has reviewed patient and no TOC needs have been identified at this time. We will continue to monitor patient advancement through interdisciplinary progression rounds. If new patient transition needs arise, please place a TOC consult.

## 2021-04-03 NOTE — Progress Notes (Signed)
ANTICOAGULATION CONSULT NOTE   Pharmacy Consult for IV heparin Indication: chest pain/ACS  Allergies  Allergen Reactions   Aspirin Cough   Lisinopril Itching    Throat itching     Patient Measurements:   Heparin Dosing Weight: 78.9 kg  Vital Signs: Temp: 98.2 F (36.8 C) (01/22 1116) Temp Source: Oral (01/22 1116) BP: 128/78 (01/22 1116) Pulse Rate: 69 (01/22 1116)  Labs: Recent Labs    04/02/21 1124 04/02/21 1147 04/02/21 1532 04/02/21 2038 04/03/21 0706 04/03/21 0939 04/03/21 1451  HGB 14.2  --   --   --  12.2*  --   --   HCT 43.0  --   --   --  36.2*  --   --   PLT 294  --   --   --  216  --   --   APTT  --  29  --   --   --   --   --   LABPROT  --  14.3  --   --   --   --   --   INR  --  1.1  --   --   --   --   --   HEPARINUNFRC  --   --   --  0.22* 0.46  --  0.33  CREATININE 1.84*  --   --   --  1.30*  --   --   CKTOTAL  --   --   --   --  194  --   --   TROPONINIHS 19*  --  4,508*  --  3,646* 2,661*  --      Estimated Creatinine Clearance: 43.3 mL/min (A) (by C-G formula based on SCr of 1.3 mg/dL (H)).   Medical History: Past Medical History:  Diagnosis Date   (HFpEF) heart failure with preserved ejection fraction ([place])    a.) MPI 12/12/2019 --> EF 42%. b.) TTE 12/17/2019 --> EF 55-60%.   Abdominal aortic atherosclerosis ([place])    a.) MPI 12/12/2019--> very mild.   Anginal pain (HCC)    Chronic anticoagulation    a.) clopidogrel   CKD (chronic kidney disease), stage III (HCC)    Coronary artery disease    Diastolic dysfunction    a.) TTE 12/17/2019 --> LVEF 55-60%; G2DD.   GERD (gastroesophageal reflux disease)    Gout    History of 2019 novel coronavirus disease (COVID-19) 09/10/2020   Hyperlipidemia    Hypertension    Hypothyroidism    Osteoarthritis of left knee    Psoriasis    Recurrent umbilical hernia    a.) repaired 01/01/2020; recurred.   SNHL (sensory-neural hearing loss), asymmetrical    TIA (transient ischemic attack)     Valvular regurgitation    a.) TTE 12/17/2019 --> EF 55-60%; mild AR, TR     No PTA anticoagulation per my chart review.  Assessment: 85YOM presenting with acute onset chest pain. PMH includes CAD, CKD3, HTN, HLD. Troponins at 19 (x1). EKG with sustained VT, s/p cardioversion.  BL labs WNL - aPTT 29. INR 1.1. Hgb stable  Date Time HL Rate/comment 1/21 2038 0.22 950 un/hr, subtherapeutic 1/22 0706 0.46 1100 un/hr, therapeutic 1/22 1451 0.33 1100 un/hr, therapeutic  Goal of Therapy:  Heparin level 0.3-0.7 units/ml Monitor platelets by anticoagulation protocol: Yes   Plan:  --Heparin level therapeutic --Continue heparin infusion at 1100 units/hr --Monitor daily heparin level, CBC, s/s of bleed   Darnelle Bos, PharmD Clinical Pharmacist 04/03/2021 3:46 PM

## 2021-04-03 NOTE — Progress Notes (Signed)
PROGRESS NOTE  Gerald Hodges [id number] DOB: 1935/07/24   PCP: Venita Lick, NP  Patient is from: Home.  Independently ambulates at baseline.  DOA: 04/02/2021 LOS: 1  Chief complaints:  Chief Complaint  Patient presents with   Chest Pain     Brief Narrative / Interim history: 86 old Spanish-speaking male with PMH of CAD/stent, [id number], diastolic CHF, HTN, HLD, gout, GERD and hypothyroidism presented to ED with new onset chest pain, palpitation, presyncope, diaphoresis and pallor and found to have monomorphic V. tach with HR to 220s but converted to NSR after synchronized cardioversion.  Repeat EKG with sinus rhythm, bigeminy and first-degree AVB.  Troponin trended from 18-4000.  BNP 179.  CXR with pulmonary vascular congestion but no overt edema.  Cardiology consulted.  He was started on IV heparin and beta-blocker.  Plan for [facility] on 04/04/2021.  Subjective: Seen and examined earlier this morning.  Patient speaks Spanish but severely impaired hearing to use video interpreter.  Nurse tech who speaks Spanish assisted with interpretation.  He denies chest pain, dyspnea, GI or UTI symptoms.  Concerned about heart catheterization due to his CKD.   Objective: Vitals:   04/02/21 2014 04/02/21 2346 04/03/21 0515 04/03/21 0754  BP: 106/71 (!) 155/137 102/65 106/62  Pulse: 74 71 66 67  Resp: 20 20 18 14   Temp: 98 F (36.7 C) (!) 97.5 F (36.4 C) 98.2 F (36.8 C) 98.9 F (37.2 C)  TempSrc: Oral   Oral  SpO2: 97% 97% 96% 96%    Examination:  GENERAL: No apparent distress.  Nontoxic. HEENT: MMM.  Vision and hearing grossly intact.  NECK: Supple.  No apparent JVD.  RESP:  No IWOB.  Fair aeration bilaterally. CVS:  RRR. Heart sounds normal.  ABD/GI/GU: BS+. Abd soft, NTND.  MSK/EXT:  Moves extremities. No apparent deformity. No edema.  SKIN: no apparent skin lesion or wound NEURO: Awake, alert and oriented appropriately.  No apparent focal neuro deficit. PSYCH: Calm.  Normal affect.   Procedures:  None  Microbiology summarized: [id number] and influenza PCR nonreactive.  Assessment & Plan: Sustained monomorphic V. tach with near syncope-s/p successful synchronized cardioversion.  Repeat EKG with first-degree AV block and bigeminy'.  TSH elevated to 11 although his TSH was only 3.86 about 4 days ago. -Cardiology following -On carvedilol -Optimize electrolytes -Follow TTE  Non-STEMI/typical chest pain/history of CAD: Patient had exertional chest pain, diaphoresis and arrhythmia as above.  Currently chest pain-free.  Troponin peaked at 4500.  -Continue IV heparin, Imdur, Coreg, Cozaar, statin, Plavix -Follow TTE -Plan for [facility] on 04/04/2021.  Chronic diastolic CHF: Appears euvolemic on exam. TTE in 02/2021 with LVEF of 60 to 65%, G1 DD and RVSP of 35 mmHg. -Continue home Lasix -Monitor fluid status  First-degree AV block -Would be cautious with nodal blocking agents.  Elevated LFT/hyperbilirubinemia-likely ischemic insult.  Patient denies drinking alcohol.  CK within normal.  Improving. -Continue monitoring  AKI on CKD-3A: AKI resolving. Recent Labs    05/27/82 1001 09/10/20 1700 12/13/20 0921 12/14/20 1044 01/27/21 1006 04/02/21 1124 04/03/21 0706  BUN 30* 41* 28* 25* 20 23 21   CREATININE 1.57* 1.88* 1.37* 1.30* 1.47* 1.84* 1.30*   Essential hypertension: Normotensive -Continue Cozaar, Coreg and Norvasc  Hyperglycemia: Denies history of diabetes. -Change SSI to sensitive -Follow hemoglobin A1c  Hypothyroidism: TSH elevated to 11 but 3.8 about 4 days ago. -Continue home Synthroid.  History of gout: Stable.  Hyperlipidemia -Continue statin  GERD -Continue PPI  There is no height  or weight on file to calculate BMI.         DVT prophylaxis:    On heparin drip for non-STEMI.  Code Status: Full code Family Communication: Patient and/or RN. Available if any question.  Level of care: Progressive Status is:  Inpatient  Remains inpatient appropriate because: Due to non-STEMI       Consultants:  Cardiology   Sch Meds:  Scheduled Meds:  allopurinol  100 mg Oral Daily   amLODipine  5 mg Oral Daily   carvedilol  6.25 mg Oral BID WC   cholecalciferol  1,000 Units Oral Daily   clopidogrel  75 mg Oral Daily   insulin aspart  0-9 Units Subcutaneous TID WC   isosorbide mononitrate  90 mg Oral Daily   levothyroxine  75 mcg Oral Daily   loratadine  10 mg Oral Daily   losartan  50 mg Oral Daily   pantoprazole  40 mg Oral Daily   [START ON 04/04/2021] rosuvastatin  10 mg Oral Q M,W,F   triamcinolone cream  1 application Topical BID   Continuous Infusions:  sodium chloride 100 mL/hr at 04/03/21 1002   heparin 1,100 Units/hr (04/03/21 1002)   PRN Meds:.acetaminophen **OR** acetaminophen, furosemide, magnesium hydroxide, meclizine, nitroGLYCERIN, [DISCONTINUED] ondansetron **OR** ondansetron (ZOFRAN) IV  Antimicrobials: Anti-infectives (From admission, onward)    None        I have personally reviewed the following labs and images: CBC: Recent Labs  Lab 04/02/21 1124 04/03/21 0706  WBC 7.1 5.8  NEUTROABS 1.9  --   HGB 14.2 12.2*  HCT 43.0 36.2*  MCV 93.7 90.5  PLT 294 216   BMP &GFR Recent Labs  Lab 04/02/21 1124 04/03/21 0706  NA 134* 135  K 4.5 4.4  CL 103 108  CO2 18* 21*  GLUCOSE 291* 91  BUN 23 21  CREATININE 1.84* 1.30*  CALCIUM 9.3 8.4*  MG 1.9 1.9   Estimated Creatinine Clearance: 43.3 mL/min (A) (by C-G formula based on SCr of 1.3 mg/dL (H)). Liver & Pancreas: Recent Labs  Lab 04/02/21 1124 04/03/21 0706  AST 74* 54*  ALT 49* 45*  ALKPHOS 126 89  BILITOT 1.4* 1.3*  PROT 7.1 6.3*  ALBUMIN 3.9 3.7   No results for input(s): LIPASE, AMYLASE in the last 168 hours. No results for input(s): AMMONIA in the last 168 hours. Diabetic: No results for input(s): HGBA1C in the last 72 hours. Recent Labs  Lab 04/02/21 1610 04/02/21 2228 04/03/21 0755   GLUCAP 109* 119* 95   Cardiac Enzymes: Recent Labs  Lab 04/03/21 0706  CKTOTAL 194   No results for input(s): PROBNP in the last 8760 hours. Coagulation Profile: Recent Labs  Lab 04/02/21 1147  INR 1.1   Thyroid Function Tests: Recent Labs    04/02/21 1239  TSH 11.193*   Lipid Profile: No results for input(s): CHOL, HDL, LDLCALC, TRIG, CHOLHDL, LDLDIRECT in the last 72 hours. Anemia Panel: No results for input(s): VITAMINB12, FOLATE, FERRITIN, TIBC, IRON, RETICCTPCT in the last 72 hours. Urine analysis:    Component Value Date/Time   COLORURINE STRAW (A) 10/16/2016 1054   APPEARANCEUR Clear 11/03/2019 0940   LABSPEC 1.010 10/16/2016 1054   PHURINE 6.0 10/16/2016 1054   GLUCOSEU Negative 11/03/2019 0940   HGBUR NEGATIVE 10/16/2016 1054   BILIRUBINUR Negative 11/03/2019 0940   KETONESUR NEGATIVE 10/16/2016 1054   PROTEINUR Trace (A) 11/03/2019 0940   PROTEINUR NEGATIVE 10/16/2016 1054   NITRITE Negative 11/03/2019 0940   NITRITE NEGATIVE 10/16/2016  1054   LEUKOCYTESUR Negative 11/03/2019 0940   Sepsis Labs: Invalid input(s): PROCALCITONIN, [place]  Microbiology: Recent Results (from the past 240 hour(s))  Resp Panel by RT-PCR (Flu A&B, Covid) Nasopharyngeal Swab     Status: None   Collection Time: 04/02/21 11:35 AM   Specimen: Nasopharyngeal Swab; Nasopharyngeal(NP) swabs in vial transport medium  Result Value Ref Range Status   SARS Coronavirus 2 by RT PCR NEGATIVE NEGATIVE Final    Comment: (NOTE) SARS-CoV-2 target nucleic acids are NOT DETECTED.  The SARS-CoV-2 RNA is generally detectable in upper respiratory specimens during the acute phase of infection. The lowest concentration of SARS-CoV-2 viral copies this assay can detect is 138 copies/mL. A negative result does not preclude SARS-Cov-2 infection and should not be used as the sole basis for treatment or other patient management decisions. A negative result may occur with  improper specimen  collection/handling, submission of specimen other than nasopharyngeal swab, presence of viral mutation(s) within the areas targeted by this assay, and inadequate number of viral copies(<138 copies/mL). A negative result must be combined with clinical observations, patient history, and epidemiological information. The expected result is Negative.  Fact Sheet for Patients:  EntrepreneurPulse.com.au  Fact Sheet for Healthcare Providers:  IncredibleEmployment.be  This test is no t yet approved or cleared by the [place] FDA and  has been authorized for detection and/or diagnosis of SARS-CoV-2 by FDA under an Emergency Use Authorization (EUA). This EUA will remain  in effect (meaning this test can be used) for the duration of the COVID-19 declaration under Section 564(b)(1) of the Act, 21 U.S.C.section 360bbb-3(b)(1), unless the authorization is terminated  or revoked sooner.       Influenza A by PCR NEGATIVE NEGATIVE Final   Influenza B by PCR NEGATIVE NEGATIVE Final    Comment: (NOTE) The Xpert Xpress SARS-CoV-2/FLU/RSV plus assay is intended as an aid in the diagnosis of influenza from Nasopharyngeal swab specimens and should not be used as a sole basis for treatment. Nasal washings and aspirates are unacceptable for Xpert Xpress SARS-CoV-2/FLU/RSV testing.  Fact Sheet for Patients: EntrepreneurPulse.com.au  Fact Sheet for Healthcare Providers: IncredibleEmployment.be  This test is not yet approved or cleared by the [place] FDA and has been authorized for detection and/or diagnosis of SARS-CoV-2 by FDA under an Emergency Use Authorization (EUA). This EUA will remain in effect (meaning this test can be used) for the duration of the COVID-19 declaration under Section 564(b)(1) of the Act, 21 U.S.C. section 360bbb-3(b)(1), unless the authorization is terminated or revoked.  Performed at [facility], [address], [place], [place] [address]     Radiology Studies: DG Chest Portable 1 View  Result Date: 04/02/2021 CLINICAL DATA:  86 year old male ventricular tachycardia status post cardioversion. EXAM: PORTABLE CHEST 1 VIEW COMPARISON:  Chest radiograph 09/15/2020 and earlier. FINDINGS: Portable AP view at levin 39 hours. Lower lung volumes. Pacer pads project over the chest. Mediastinal contours are stable and within normal limits. Pulmonary vascular congestion without overt edema. Chronic blunting of the left costophrenic angle appears stable from last year. No pneumothorax or consolidation identified. No acute osseous abnormality identified. Visualized tracheal air column is within normal limits. IMPRESSION: Low lung volumes with pulmonary vascular congestion but no overt edema. Electronically Signed   By: Genevie Ann M.D.   On: 04/02/2021 11:58      Leitha Hyppolite T. [place]  If 7PM-7AM, please contact night-coverage [email or web address] 04/03/2021, 10:35 AM

## 2021-04-04 ENCOUNTER — Encounter
Admission: EM | Disposition: A | Discharge status: Home / Self Care | Payer: Self-pay | Source: Home / Self Care | Attending: Student

## 2021-04-04 ENCOUNTER — Other Ambulatory Visit: Payer: Self-pay | Admitting: Physician Assistant

## 2021-04-04 ENCOUNTER — Encounter: Payer: Self-pay | Admitting: Cardiovascular Disease

## 2021-04-04 DIAGNOSIS — I214 Non-ST elevation (NSTEMI) myocardial infarction: Secondary | ICD-10-CM | POA: Diagnosis not present

## 2021-04-04 DIAGNOSIS — N179 Acute kidney failure, unspecified: Secondary | ICD-10-CM | POA: Diagnosis not present

## 2021-04-04 DIAGNOSIS — I472 Ventricular tachycardia, unspecified: Secondary | ICD-10-CM | POA: Diagnosis not present

## 2021-04-04 DIAGNOSIS — I5032 Chronic diastolic (congestive) heart failure: Secondary | ICD-10-CM | POA: Diagnosis not present

## 2021-04-04 DIAGNOSIS — I2510 Atherosclerotic heart disease of native coronary artery without angina pectoris: Secondary | ICD-10-CM | POA: Diagnosis not present

## 2021-04-04 HISTORY — PX: LEFT HEART CATH AND CORONARY ANGIOGRAPHY: CATH118249

## 2021-04-04 LAB — COMPREHENSIVE METABOLIC PANEL
ALT: 37 U/L (ref 0–44)
AST: 35 U/L (ref 15–41)
Albumin: 3.8 g/dL (ref 3.5–5.0)
Alkaline Phosphatase: 89 U/L (ref 38–126)
Anion gap: 5 (ref 5–15)
BUN: 24 mg/dL — ABNORMAL HIGH (ref 8–23)
CO2: 25 mmol/L (ref 22–32)
Calcium: 8.9 mg/dL (ref 8.9–10.3)
Chloride: 108 mmol/L (ref 98–111)
Creatinine, Ser: 1.54 mg/dL — ABNORMAL HIGH (ref 0.61–1.24)
GFR, Estimated: 44 mL/min — ABNORMAL LOW (ref >60)
Glucose, Bld: 98 mg/dL (ref 70–99)
Potassium: 4.3 mmol/L (ref 3.5–5.1)
Sodium: 138 mmol/L (ref 135–145)
Total Bilirubin: 1.1 mg/dL (ref 0.3–1.2)
Total Protein: 6.6 g/dL (ref 6.5–8.1)

## 2021-04-04 LAB — CBC
HCT: 37.5 % — ABNORMAL LOW (ref 39.0–52.0)
Hemoglobin: 13 g/dL (ref 13.0–17.0)
MCH: 31.8 pg (ref 26.0–34.0)
MCHC: 34.7 g/dL (ref 30.0–36.0)
MCV: 91.7 fL (ref 80.0–100.0)
Platelets: 234 10*3/uL (ref 150–400)
RBC: 4.09 MIL/uL — ABNORMAL LOW (ref 4.22–5.81)
RDW: 13.8 % (ref 11.5–15.5)
WBC: 5.2 10*3/uL (ref 4.0–10.5)
nRBC: 0 % (ref 0.0–0.2)

## 2021-04-04 LAB — LIPID PANEL
Cholesterol: 140 mg/dL (ref 0–200)
HDL: 43 mg/dL (ref >40)
LDL Cholesterol: 78 mg/dL (ref 0–99)
Total CHOL/HDL Ratio: 3.3 RATIO
Triglycerides: 94 mg/dL (ref <150)
VLDL: 19 mg/dL (ref 0–40)

## 2021-04-04 LAB — HEPARIN LEVEL (UNFRACTIONATED): Heparin Unfractionated: 0.31 IU/mL (ref 0.30–0.70)

## 2021-04-04 LAB — GLUCOSE, CAPILLARY
Glucose-Capillary: 99 mg/dL (ref 70–99)
Glucose-Capillary: 113 mg/dL — ABNORMAL HIGH (ref 70–99)
Glucose-Capillary: 107 mg/dL — ABNORMAL HIGH (ref 70–99)
Glucose-Capillary: 152 mg/dL — ABNORMAL HIGH (ref 70–99)

## 2021-04-04 LAB — HEMOGLOBIN A1C
Hgb A1c MFr Bld: 6 % — ABNORMAL HIGH (ref 4.8–5.6)
Mean Plasma Glucose: 126 mg/dL

## 2021-04-04 LAB — PHOSPHORUS: Phosphorus: 3.1 mg/dL (ref 2.5–4.6)

## 2021-04-04 LAB — MAGNESIUM: Magnesium: 1.8 mg/dL (ref 1.7–2.4)

## 2021-04-04 SURGERY — LEFT HEART CATH AND CORONARY ANGIOGRAPHY
Anesthesia: Moderate Sedation

## 2021-04-04 MED ORDER — IOHEXOL 300 MG/ML  SOLN
INTRAMUSCULAR | Status: DC | PRN
  Administered 2021-04-04: 33 mL

## 2021-04-04 MED ORDER — MIDAZOLAM HCL 2 MG/2ML IJ SOLN
INTRAMUSCULAR | Status: AC
  Filled 2021-04-04: qty 2

## 2021-04-04 MED ORDER — AMIODARONE HCL 200 MG PO TABS
400.0000 mg | ORAL_TABLET | Freq: Two times a day (BID) | ORAL | Status: DC
  Administered 2021-04-04 – 2021-04-05 (×3): 400 mg via ORAL
  Administered 2021-04-05: 23:00:00 200 mg via ORAL
  Administered 2021-04-06: 10:00:00 400 mg via ORAL
  Filled 2021-04-04 (×6): qty 2

## 2021-04-04 MED ORDER — SODIUM CHLORIDE 0.9 % IV SOLN: INTRAVENOUS | Status: AC

## 2021-04-04 MED ORDER — FENTANYL CITRATE (PF) 100 MCG/2ML IJ SOLN
INTRAMUSCULAR | Status: DC | PRN
  Administered 2021-04-04: 25 ug via INTRAVENOUS

## 2021-04-04 MED ORDER — SODIUM CHLORIDE 0.9% FLUSH: 3.0000 mL | INTRAVENOUS | Status: DC | PRN

## 2021-04-04 MED ORDER — MIDAZOLAM HCL 2 MG/2ML IJ SOLN
INTRAMUSCULAR | Status: DC | PRN
  Administered 2021-04-04: 1 mg via INTRAVENOUS

## 2021-04-04 MED ORDER — HEPARIN SODIUM (PORCINE) 1000 UNIT/ML IJ SOLN
INTRAMUSCULAR | Status: DC | PRN
  Administered 2021-04-04: 4000 [IU] via INTRAVENOUS

## 2021-04-04 MED ORDER — HEPARIN SODIUM (PORCINE) 1000 UNIT/ML IJ SOLN
INTRAMUSCULAR | Status: AC
  Filled 2021-04-04: qty 10

## 2021-04-04 MED ORDER — ENOXAPARIN SODIUM 40 MG/0.4ML IJ SOSY
40.0000 mg | PREFILLED_SYRINGE | INTRAMUSCULAR | Status: DC
  Administered 2021-04-05 – 2021-04-06 (×2): 40 mg via SUBCUTANEOUS
  Filled 2021-04-04 (×2): qty 0.4

## 2021-04-04 MED ORDER — SODIUM CHLORIDE 0.9 % IV SOLN: 250.0000 mL | INTRAVENOUS | Status: DC | PRN

## 2021-04-04 MED ORDER — SODIUM CHLORIDE 0.9% FLUSH
3.0000 mL | Freq: Two times a day (BID) | INTRAVENOUS | Status: DC
  Administered 2021-04-04 – 2021-04-06 (×5): 3 mL via INTRAVENOUS

## 2021-04-04 MED ORDER — SODIUM CHLORIDE 0.9% FLUSH: 3.0000 mL | Freq: Two times a day (BID) | INTRAVENOUS | Status: DC

## 2021-04-04 MED ORDER — VERAPAMIL HCL 2.5 MG/ML IV SOLN
INTRAVENOUS | Status: AC
  Filled 2021-04-04: qty 2

## 2021-04-04 MED ORDER — HEPARIN (PORCINE) IN NACL 1000-0.9 UT/500ML-% IV SOLN
INTRAVENOUS | Status: AC
  Filled 2021-04-04: qty 1000

## 2021-04-04 MED ORDER — FENTANYL CITRATE (PF) 100 MCG/2ML IJ SOLN
INTRAMUSCULAR | Status: AC
  Filled 2021-04-04: qty 2

## 2021-04-04 MED ORDER — VERAPAMIL HCL 2.5 MG/ML IV SOLN
INTRAVENOUS | Status: DC | PRN
  Administered 2021-04-04: 2.5 mg via INTRA_ARTERIAL

## 2021-04-04 MED ORDER — HEPARIN (PORCINE) IN NACL 1000-0.9 UT/500ML-% IV SOLN
INTRAVENOUS | Status: DC | PRN
  Administered 2021-04-04: 1000 mL

## 2021-04-04 SURGICAL SUPPLY — 11 items
CATH INFINITI 5 FR JL3.5 (CATHETERS) ×1 IMPLANT
CATH OPTITORQUE JACKY 4.0 5F (CATHETERS) ×1 IMPLANT
DEVICE RAD TR BAND REGULAR (VASCULAR PRODUCTS) ×2 IMPLANT
DRAPE BRACHIAL (DRAPES) ×1 IMPLANT
GLIDESHEATH SLEND SS 6F .021 (SHEATH) ×1 IMPLANT
GUIDEWIRE INQWIRE 1.5J.035X260 (WIRE) IMPLANT
INQWIRE 1.5J .035X260CM (WIRE) ×2
PACK CARDIAC CATH (CUSTOM PROCEDURE TRAY) ×2 IMPLANT
PROTECTION STATION PRESSURIZED (MISCELLANEOUS) ×2
SET ATX SIMPLICITY (MISCELLANEOUS) ×1 IMPLANT
STATION PROTECTION PRESSURIZED (MISCELLANEOUS) IMPLANT

## 2021-04-04 NOTE — Progress Notes (Signed)
Progress Note  Patient Name: Gerald Hodges Date of Encounter: 04/04/2021  [facility] Cardiologist: Nelva Bush, MD   Subjective   The patient was seen before and after cardiac catheterization with the presence of an interpreter.  No chest pain or significant dyspnea.  He continues to have PVCs.  Cardiac catheterization showed no evidence of obstructive coronary artery disease.   Inpatient Medications    Scheduled Meds:  allopurinol  100 mg Oral Daily   carvedilol  6.25 mg Oral BID WC   cholecalciferol  1,000 Units Oral Daily   clopidogrel  75 mg Oral Daily   insulin aspart  0-9 Units Subcutaneous TID WC   isosorbide mononitrate  90 mg Oral Daily   levothyroxine  75 mcg Oral Daily   loratadine  10 mg Oral Daily   losartan  50 mg Oral Daily   pantoprazole  40 mg Oral Daily   rosuvastatin  10 mg Oral Q M,W,F   triamcinolone cream  1 application Topical BID   Continuous Infusions:  heparin 1,100 Units/hr (04/04/21 0625)   PRN Meds: acetaminophen **OR** acetaminophen, magnesium hydroxide, meclizine, nitroGLYCERIN, [DISCONTINUED] ondansetron **OR** ondansetron (ZOFRAN) IV   Vital Signs    Vitals:   04/04/21 0915 04/04/21 0930 04/04/21 1030 04/04/21 1158  BP: 134/88 (!) 141/65 135/84 (!) 141/80  Pulse: 65 66 67 62  Resp: 17 20 (!) 28 18  Temp:      TempSrc:      SpO2: 96% 97% 97% 98%  Weight:        Intake/Output Summary (Last 24 hours) at 04/04/2021 1345 Last data filed at 04/04/2021 1100 Gross per 24 hour  Intake 524.18 ml  Output 1000 ml  Net -475.82 ml    Last 3 Weights 04/03/2021 03/30/2021 01/27/2021  Weight (lbs) 192 lb 0.3 oz 192 lb 187 lb 3.2 oz  Weight (kg) 87.1 kg 87.091 kg 84.913 kg      Telemetry    nsr with PVCs- Personally Reviewed  ECG     - Personally Reviewed  Physical Exam   GEN: No acute distress.   Neck: No JVD Cardiac: RRR, no murmurs, rubs, or gallops.  Respiratory: Clear to auscultation bilaterally. GI: Soft,  nontender, non-distended  MS: No edema; No deformity. Neuro:  Nonfocal  Psych: Normal affect   Labs    High Sensitivity Troponin:   Recent Labs  Lab 04/02/21 1124 04/02/21 1532 04/03/21 0706 04/03/21 0939  TROPONINIHS 19* 4,508* 3,646* 2,661*      Chemistry Recent Labs  Lab 04/02/21 1124 04/03/21 0706 04/04/21 0551  NA 134* 135 138  K 4.5 4.4 4.3  CL 103 108 108  CO2 18* 21* 25  GLUCOSE 291* 91 98  BUN 23 21 24*  CREATININE 1.84* 1.30* 1.54*  CALCIUM 9.3 8.4* 8.9  MG 1.9 1.9 1.8  PROT 7.1 6.3* 6.6  ALBUMIN 3.9 3.7 3.8  AST 74* 54* 35  ALT 49* 45* 37  ALKPHOS 126 89 89  BILITOT 1.4* 1.3* 1.1  GFRNONAA 36* 55* 44*  ANIONGAP 13 6 5      Lipids  Recent Labs  Lab 04/04/21 0551  CHOL 140  TRIG 94  HDL 43  LDLCALC 78  CHOLHDL 3.3     Hematology Recent Labs  Lab 04/02/21 1124 04/03/21 0706 04/04/21 0551  WBC 7.1 5.8 5.2  RBC 4.59 4.00* 4.09*  HGB 14.2 12.2* 13.0  HCT 43.0 36.2* 37.5*  MCV 93.7 90.5 91.7  MCH 30.9 30.5 31.8  MCHC  33.0 33.7 34.7  RDW 14.0 13.9 13.8  PLT 294 216 234    Thyroid  Recent Labs  Lab 03/30/21 1612 04/02/21 1239  TSH 3.860 11.193*  FREET4 1.27  --      BNP Recent Labs  Lab 04/02/21 1124  BNP 179.6*     DDimer No results for input(s): DDIMER in the last 168 hours.   Radiology    CARDIAC CATHETERIZATION  Result Date: 04/04/2021   Prox RCA lesion is 20% stenosed.   Dist RCA lesion is 20% stenosed.   Mid LAD lesion is 40% stenosed. 1.  Superdominant right coronary artery with relatively small LAD.  The LAD has moderate mid stenosis.  Overall, no evidence of obstructive coronary artery disease. 2.  Left ventricular angiography was not performed due to chronic kidney disease.  EF was normal by echo. 3.  Mildly to moderately elevated left ventricular end-diastolic pressure at 22 mmHg. Recommendations: no obstructive coronary artery disease to explain ventricular tachycardia.  Elevated troponin is likely due to supply  demand ischemia in the setting of sustained ventricular tachycardia requiring cardioversion. I consulted EP for management of this patient.   ECHOCARDIOGRAM COMPLETE  Result Date: 04/03/2021    ECHOCARDIOGRAM REPORT   Patient Name:   Gerald Hodges [facility] Date of Exam: 04/03/2021 Medical Rec #:  [id number]                Height:       64.5 in Accession #:    [id number]               Weight:       192.0 lb Date of Birth:  04-03-1935               BSA:          1.933 m Patient Age:    86 years                 BP:           120/80 mmHg Patient Gender: M                        HR:           77 bpm. Exam Location:  [facility] Procedure: 2D Echo, Cardiac Doppler and Color Doppler Indications:    I47.2 Ventricular tachycardia; I20.9 Angina pectoris,                 unspecified  History:        Patient has prior history of Echocardiogram examinations. Angina                 and CAD; Signs/Symptoms:Chest Pain.  Sonographer:    Alyse Low Roar Referring Phys: [id number] [place]  1. Left ventricular ejection fraction, by estimation, is 55 to 60%. The left ventricle has normal function. The left ventricle has no regional wall motion abnormalities. There is mild left ventricular hypertrophy. Left ventricular diastolic parameters are consistent with Grade I diastolic dysfunction (impaired relaxation).  2. Right ventricular systolic function is normal. The right ventricular size is normal. There is mildly elevated pulmonary artery systolic pressure. The estimated right ventricular systolic pressure is 99991111 mmHg.  3. Left atrial size was mildly dilated.  4. The mitral valve is normal in structure. Moderate mitral valve regurgitation. No evidence of mitral stenosis.  5. Tricuspid valve regurgitation is mild to moderate.  6. The aortic valve was not well visualized. Aortic valve  regurgitation is mild. No aortic stenosis is present.  7. The inferior vena cava is normal in size with greater than 50% respiratory variability,  suggesting right atrial pressure of 3 mmHg. FINDINGS  Left Ventricle: Left ventricular ejection fraction, by estimation, is 55 to 60%. The left ventricle has normal function. The left ventricle has no regional wall motion abnormalities. The left ventricular internal cavity size was normal in size. There is  mild left ventricular hypertrophy. Left ventricular diastolic parameters are consistent with Grade I diastolic dysfunction (impaired relaxation). Right Ventricle: The right ventricular size is normal. No increase in right ventricular wall thickness. Right ventricular systolic function is normal. There is mildly elevated pulmonary artery systolic pressure. The tricuspid regurgitant velocity is 2.89  m/s, and with an assumed right atrial pressure of 5 mmHg, the estimated right ventricular systolic pressure is [id number] mmHg. Left Atrium: Left atrial size was mildly dilated. Right Atrium: Right atrial size was normal in size. Pericardium: There is no evidence of pericardial effusion. Mitral Valve: The mitral valve is normal in structure. Moderate mitral valve regurgitation. No evidence of mitral valve stenosis. Tricuspid Valve: The tricuspid valve is normal in structure. Tricuspid valve regurgitation is mild to moderate. No evidence of tricuspid stenosis. Aortic Valve: The aortic valve was not well visualized. Aortic valve regurgitation is mild. No aortic stenosis is present. Aortic valve peak gradient measures 12.2 mmHg. Pulmonic Valve: The pulmonic valve was normal in structure. Pulmonic valve regurgitation is mild. No evidence of pulmonic stenosis. Aorta: The aortic root is normal in size and structure. Venous: The inferior vena cava is normal in size with greater than 50% respiratory variability, suggesting right atrial pressure of 3 mmHg. IAS/Shunts: No atrial level shunt detected by color flow Doppler.  LEFT VENTRICLE PLAX 2D LVIDd:         4.50 cm Diastology LVIDs:         3.40 cm LV e' medial:    6.31 cm/s LV PW:          1.20 cm LV E/e' medial:  16.0 LV IVS:        1.20 cm LV e' lateral:   8.49 cm/s                        LV E/e' lateral: 11.9  RIGHT VENTRICLE RV Mid diam:    2.70 cm RV S prime:     12.70 cm/s TAPSE (M-mode): 2.1 cm LEFT ATRIUM             Index        RIGHT ATRIUM           Index LA diam:        4.50 cm 2.33 cm/m   RA Area:     12.60 cm LA Vol (A2C):   48.5 ml 25.09 ml/m  RA Volume:   25.10 ml  12.98 ml/m LA Vol (A4C):   55.9 ml 28.92 ml/m LA Biplane Vol: 53.1 ml 27.47 ml/m  AORTIC VALVE              PULMONIC VALVE AV Vmax:      175.00 cm/s PV Vmax:          1.02 m/s AV Peak Grad: 12.2 mmHg   PV Peak grad:     4.2 mmHg LVOT Vmax:    93.60 cm/s  PR End Diast Vel: 6.97 msec  RVOT Peak grad:   1 mmHg AORTA Ao Asc diam: 3.10 cm MITRAL VALVE                TRICUSPID VALVE MV Area (PHT): 4.65 cm     TR Peak grad:   33.4 mmHg MV Decel Time: 163 msec     TR Vmax:        289.00 cm/s MV E velocity: 101.00 cm/s MV A velocity: 119.00 cm/s MV E/A ratio:  0.85 MV A Prime:    10.8 cm/s Ida Rogue MD Electronically signed by Ida Rogue MD Signature Date/Time: 04/03/2021/2:42:58 PM    Final     Cardiac Studies   Echocardiogram pending   Patient Profile     Keivan Oxborrow is a 86 y.o. male with a hx of CAD, chronic stable angina, TIA, chronic kidney disease, hypertension hyperlipidemia presenting with chest pain, near syncope, tachycardia noted to be in sustained VT in the emergency room with angina, hemodynamic instability requiring cardioversion  Assessment & Plan    Sustained VT with near syncope With associated diaphoresis, chest pain, profound hypotension The etiology of his ventricular tachycardia is not entirely clear it has a right bundle branch block morphology with superior axis suggesting a left ventricular origin.  No evidence of previous scar on echocardiogram and cardiac catheterization today showed no evidence of obstructive coronary artery  disease.   I discussed the case with Dr. Caryl Comes who will see the patient tomorrow.  In the meanwhile, we will start the patient on oral the patient had elevated troponin amiodarone   Coronary disease with history of stable angina: Significantly elevated troponin on presentation likely due to supply demand ischemia in the setting of ventricular tachycardia and hypotension. Cardiac catheterization was done this morning which showed superdominant right coronary artery with relatively small LAD which had moderate mid stenosis.  Overall, no evidence of obstructive disease. Recommend continuing medical therapy.  Chronic kidney disease stage IIIb Creatinine 1.3, at baseline.  Monitor renal function postcardiac cath.  Only 33 mL of contrast was used.   Hyperlipidemia Continue rosuvastatin.    Total encounter time more than 35 minutes  Greater than 50% was spent in counseling and coordination of care with the patient  For questions or updates, please contact [place] Please consult [email or web address] for contact info under        Signed, Kathlyn Sacramento, MD  04/04/2021, 1:45 PM

## 2021-04-04 NOTE — Progress Notes (Signed)
Unable to consent pt for heart cath, he is having difficulty understanding the video interpreter, charge nurse called to see if a  interpreter was available there is not one on nights available on days. All other pre-op orders complete.

## 2021-04-04 NOTE — Progress Notes (Signed)
ANTICOAGULATION CONSULT NOTE   Pharmacy Consult for IV heparin Indication: chest pain/ACS  Allergies  Allergen Reactions   Aspirin Cough   Lisinopril Itching    Throat itching     Patient Measurements: Weight: 87.1 kg (192 lb 0.3 oz) Heparin Dosing Weight: 78.9 kg  Vital Signs: Temp: 98.3 F (36.8 C) (01/23 0500) Temp Source: Oral (01/23 0500) BP: 142/82 (01/23 0500) Pulse Rate: 71 (01/23 0500)  Labs: Recent Labs    04/02/21 1124 04/02/21 1147 04/02/21 1532 04/02/21 2038 04/03/21 0706 04/03/21 0939 04/03/21 1451 04/04/21 0551  HGB 14.2  --   --   --  12.2*  --   --  13.0  HCT 43.0  --   --   --  36.2*  --   --  37.5*  PLT 294  --   --   --  216  --   --  234  APTT  --  29  --   --   --   --   --   --   LABPROT  --  14.3  --   --   --   --   --   --   INR  --  1.1  --   --   --   --   --   --   HEPARINUNFRC  --   --   --    < > 0.46  --  0.33 0.31  CREATININE 1.84*  --   --   --  1.30*  --   --  1.54*  CKTOTAL  --   --   --   --  194  --   --   --   TROPONINIHS 19*  --  4,508*  --  3,646* 2,661*  --   --    < > = values in this interval not displayed.     Estimated Creatinine Clearance: 36.6 mL/min (A) (by C-G formula based on SCr of 1.54 mg/dL (H)).   Medical History: Past Medical History:  Diagnosis Date   (HFpEF) heart failure with preserved ejection fraction ([place])    a.) MPI 12/12/2019 --> EF 42%. b.) TTE 12/17/2019 --> EF 55-60%.   Abdominal aortic atherosclerosis ([place])    a.) MPI 12/12/2019--> very mild.   Anginal pain (HCC)    Chronic anticoagulation    a.) clopidogrel   CKD (chronic kidney disease), stage III (HCC)    Coronary artery disease    Diastolic dysfunction    a.) TTE 12/17/2019 --> LVEF 55-60%; G2DD.   GERD (gastroesophageal reflux disease)    Gout    History of 2019 novel coronavirus disease (COVID-19) 09/10/2020   Hyperlipidemia    Hypertension    Hypothyroidism    Osteoarthritis of left knee    Psoriasis    Recurrent  umbilical hernia    a.) repaired 01/01/2020; recurred.   SNHL (sensory-neural hearing loss), asymmetrical    TIA (transient ischemic attack)    Valvular regurgitation    a.) TTE 12/17/2019 --> EF 55-60%; mild AR, TR     No PTA anticoagulation per my chart review.  Assessment: 85YOM presenting with acute onset chest pain. PMH includes CAD, CKD3, HTN, HLD. Troponins at 19 (x1). EKG with sustained VT, s/p cardioversion.  BL labs WNL - aPTT 29. INR 1.1. Hgb stable  Date Time HL Rate/comment 1/21 2038 0.22 950 un/hr, subtherapeutic 1/22 0706 0.46 1100 un/hr, therapeutic 1/22 1451 0.33 1100 un/hr, therapeutic 1/23     0551  0.31    1100 units/hr, therapeutic   Goal of Therapy:  Heparin level 0.3-0.7 units/ml Monitor platelets by anticoagulation protocol: Yes   Plan:  1/23:  HL @ 0551 = 0.31, therapeutic X 3 Will continue pt on current rate and recheck HL on 1/24 with AM labs.   Orene Desanctis, PharmD Clinical Pharmacist 04/04/2021 6:57 AM

## 2021-04-04 NOTE — Interval H&P Note (Signed)
Cath Lab Visit (complete for each Cath Lab visit)  Clinical Evaluation Leading to the Procedure:   ACS: Yes.    Non-ACS:  n/a   History and Physical Interval Note:  04/04/2021 8:43 AM  [name]  has presented today for surgery, with the diagnosis of Sustained ventricular tachycardia.  The various methods of treatment have been discussed with the patient and family. After consideration of risks, benefits and other options for treatment, the patient has consented to  Procedure(s): LEFT HEART CATH AND CORONARY ANGIOGRAPHY (N/A) as a surgical intervention.  The patient's history has been reviewed, patient examined, no change in status, stable for surgery.  I have reviewed the patient's chart and labs.  Questions were answered to the patient's satisfaction.     Lorine Bears

## 2021-04-04 NOTE — Progress Notes (Addendum)
PROGRESS NOTE  Gerald Hodges [id number] DOB: 13-May-1935   PCP: Venita Lick, NP  Patient is from: Home.  Independently ambulates at baseline.  DOA: 04/02/2021 LOS: 2  Chief complaints:  Chief Complaint  Patient presents with   Chest Pain     Brief Narrative / Interim history: 89 old Spanish-speaking male with PMH of CAD/stent, [id number], diastolic CHF, HTN, HLD, gout, GERD and hypothyroidism presented to ED with new onset chest pain, palpitation, presyncope, diaphoresis and pallor and found to have monomorphic V. tach with HR to 220s but converted to NSR after synchronized cardioversion.  Repeat EKG with sinus rhythm, bigeminy and first-degree AVB.  Troponin trended from 18-4000.  BNP 179.  CXR with pulmonary vascular congestion but no overt edema.  Cardiology consulted.  He was started on IV heparin and beta-blocker.  TTE with LVEF of 55 to 60%, G1 DD, no RWMA, moderate MVR, mild to moderate TVR and RVSP of 38 mmHg.Marland Kitchen  LHC with nonobstructive CAD (see details below).  Cardiology recommended medical management.  Electrophysiology consulted.  Subjective: Seen and examined this afternoon after he returned from [facility].  Patient's wife at bedside.  Patient has significantly impaired hearing to use video interpreter.  Patient's RN who speaks Spanish assisted with interpretation.  Patient has no complaints other than mild right upper extremity pain that he attributes to IV fluid.  Denies chest pain, dyspnea, palpitation, GI or UTI symptoms.  No notable swelling, skin color change in RUE.  Objective: Vitals:   04/04/21 0915 04/04/21 0930 04/04/21 1030 04/04/21 1158  BP: 134/88 (!) 141/65 135/84 (!) 141/80  Pulse: 65 66 67 62  Resp: 17 20 (!) 28 18  Temp:      TempSrc:      SpO2: 96% 97% 97% 98%  Weight:        Examination:  GENERAL: No apparent distress.  Nontoxic. HEENT: MMM.  Vision and hearing grossly intact.  NECK: Supple.  No apparent JVD.  RESP:  No IWOB.  Fair  aeration bilaterally. CVS:  RRR. Heart sounds normal.  ABD/GI/GU: BS+. Abd soft, NTND.  MSK/EXT:  Moves extremities. No apparent deformity. No edema.  SKIN: no apparent skin lesion or wound NEURO: Awake, alert and oriented appropriately.  No apparent focal neuro deficit. PSYCH: Calm. Normal affect.   Procedures:  04/04/2021-LHC with nonobstructive CAD  Microbiology summarized: COVID-19 and influenza PCR nonreactive.  Assessment & Plan: Sustained monomorphic V. tach with near syncope-s/p successful synchronized cardioversion.  Repeat EKG with first-degree AV block and bigeminy'.  TSH elevated to 11 although his TSH was only 3.86 about 4 days ago.  TTE and LHC as above.  Undiagnosed sleep apnea? -Started on amiodarone. -Electrophysiology consulted by cardiology. -On carvedilol 6.25 mg twice daily -Optimize electrolytes -Could benefit from sleep study.  Non-STEMI/typical chest pain/nonobstructive CAD: Patient had exertional chest pain, diaphoresis and arrhythmia as above.  Currently chest pain-free.  Troponin peaked at 4500.  TTE and LHC as above -Continue IV heparin, Imdur, Coreg, Cozaar, statin, Plavix  Chronic diastolic CHF: Appears euvolemic on exam. TTE as above.  Appears euvolemic on exam. -Continue home Lasix -Monitor fluid status  First-degree AV block -Would be cautious with nodal blocking agents.  Elevated LFT/hyperbilirubinemia-likely ischemic insult.  Denies alcohol.  CK normal.  Resolved.   AKI on CKD-3A: AKI resolving. Recent Labs    05/26/20 1001 09/10/20 1700 12/13/20 0921 12/14/20 1044 01/27/21 1006 04/02/21 1124 04/03/21 0706 04/04/21 0551  BUN 30* 41* 28* 25* 20 23 21  24*  CREATININE 1.57* 1.88* 1.37* 1.30* 1.47* 1.84* 1.30* 1.54*  -Continue monitoring  Essential hypertension: Normotensive -Continue Cozaar, Coreg and Norvasc  Hyperglycemia: Denies history of diabetes.  A1c 6.0%. Recent Labs  Lab 04/03/21 0755 04/03/21 1117 04/03/21 1618  04/04/21 0759 04/04/21 1200  GLUCAP 95 103* 130* 99 113*  -Continue SSI-sensitive  Hypothyroidism: TSH elevated to 11 but 3.8 about 4 days ago. -Continue home Synthroid.  History of gout: Stable.  Hyperlipidemia -Continue statin  GERD -Continue PPI  Class I obesity Body mass index is 32.45 kg/m.         DVT prophylaxis:  enoxaparin (LOVENOX) injection 40 mg Start: 04/05/21 0800   Code Status: Full code Family Communication: Updated patient's wife at bedside. Level of care: Progressive.  Change level of care to telemetry Status is: Inpatient  Remains inpatient appropriate because: Pending EP evaluation for ventricular tachycardia       Consultants:  Cardiology Electrophysiology  Sch Meds:  Scheduled Meds:  allopurinol  100 mg Oral Daily   amiodarone  400 mg Oral BID   carvedilol  6.25 mg Oral BID WC   cholecalciferol  1,000 Units Oral Daily   clopidogrel  75 mg Oral Daily   [START ON 04/05/2021] enoxaparin (LOVENOX) injection  40 mg Subcutaneous Q24H   insulin aspart  0-9 Units Subcutaneous TID WC   isosorbide mononitrate  90 mg Oral Daily   levothyroxine  75 mcg Oral Daily   loratadine  10 mg Oral Daily   losartan  50 mg Oral Daily   pantoprazole  40 mg Oral Daily   rosuvastatin  10 mg Oral Q M,W,F   sodium chloride flush  3 mL Intravenous Q12H   triamcinolone cream  1 application Topical BID   Continuous Infusions:  sodium chloride     sodium chloride     PRN Meds:.sodium chloride, acetaminophen **OR** acetaminophen, magnesium hydroxide, meclizine, nitroGLYCERIN, [DISCONTINUED] ondansetron **OR** ondansetron (ZOFRAN) IV, sodium chloride flush  Antimicrobials: Anti-infectives (From admission, onward)    None        I have personally reviewed the following labs and images: CBC: Recent Labs  Lab 04/02/21 1124 04/03/21 0706 04/04/21 0551  WBC 7.1 5.8 5.2  NEUTROABS 1.9  --   --   HGB 14.2 12.2* 13.0  HCT 43.0 36.2* 37.5*  MCV 93.7  90.5 91.7  PLT 294 216 234   BMP &GFR Recent Labs  Lab 04/02/21 1124 04/03/21 0706 04/04/21 0551  NA 134* 135 138  K 4.5 4.4 4.3  CL 103 108 108  CO2 18* 21* 25  GLUCOSE 291* 91 98  BUN 23 21 24*  CREATININE 1.84* 1.30* 1.54*  CALCIUM 9.3 8.4* 8.9  MG 1.9 1.9 1.8  PHOS  --   --  3.1   Estimated Creatinine Clearance: 36.6 mL/min (A) (by C-G formula based on SCr of 1.54 mg/dL (H)). Liver & Pancreas: Recent Labs  Lab 04/02/21 1124 04/03/21 0706 04/04/21 0551  AST 74* 54* 35  ALT 49* 45* 37  ALKPHOS 126 89 89  BILITOT 1.4* 1.3* 1.1  PROT 7.1 6.3* 6.6  ALBUMIN 3.9 3.7 3.8   No results for input(s): LIPASE, AMYLASE in the last 168 hours. No results for input(s): AMMONIA in the last 168 hours. Diabetic: Recent Labs    04/02/21 1532  HGBA1C 6.0*   Recent Labs  Lab 04/03/21 0755 04/03/21 1117 04/03/21 1618 04/04/21 0759 04/04/21 1200  GLUCAP 95 103* 130* 99 113*   Cardiac Enzymes: Recent Labs  Lab  04/03/21 0706  CKTOTAL 194   No results for input(s): PROBNP in the last 8760 hours. Coagulation Profile: Recent Labs  Lab 04/02/21 1147  INR 1.1   Thyroid Function Tests: Recent Labs    04/02/21 1239  TSH 11.193*   Lipid Profile: Recent Labs    04/04/21 0551  CHOL 140  HDL 43  LDLCALC 78  TRIG 94  CHOLHDL 3.3   Anemia Panel: No results for input(s): VITAMINB12, FOLATE, FERRITIN, TIBC, IRON, RETICCTPCT in the last 72 hours. Urine analysis:    Component Value Date/Time   COLORURINE STRAW (A) 10/16/2016 1054   APPEARANCEUR Clear 11/03/2019 0940   LABSPEC 1.010 10/16/2016 1054   PHURINE 6.0 10/16/2016 1054   GLUCOSEU Negative 11/03/2019 0940   HGBUR NEGATIVE 10/16/2016 1054   BILIRUBINUR Negative 11/03/2019 0940   KETONESUR NEGATIVE 10/16/2016 1054   PROTEINUR Trace (A) 11/03/2019 0940   PROTEINUR NEGATIVE 10/16/2016 1054   NITRITE Negative 11/03/2019 0940   NITRITE NEGATIVE 10/16/2016 1054   LEUKOCYTESUR Negative 11/03/2019 0940    Sepsis Labs: Invalid input(s): PROCALCITONIN, [place]  Microbiology: Recent Results (from the past 240 hour(s))  Resp Panel by RT-PCR (Flu A&B, Covid) Nasopharyngeal Swab     Status: None   Collection Time: 04/02/21 11:35 AM   Specimen: Nasopharyngeal Swab; Nasopharyngeal(NP) swabs in vial transport medium  Result Value Ref Range Status   SARS Coronavirus 2 by RT PCR NEGATIVE NEGATIVE Final    Comment: (NOTE) SARS-CoV-2 target nucleic acids are NOT DETECTED.  The SARS-CoV-2 RNA is generally detectable in upper respiratory specimens during the acute phase of infection. The lowest concentration of SARS-CoV-2 viral copies this assay can detect is 138 copies/mL. A negative result does not preclude SARS-Cov-2 infection and should not be used as the sole basis for treatment or other patient management decisions. A negative result may occur with  improper specimen collection/handling, submission of specimen other than nasopharyngeal swab, presence of viral mutation(s) within the areas targeted by this assay, and inadequate number of viral copies(<138 copies/mL). A negative result must be combined with clinical observations, patient history, and epidemiological information. The expected result is Negative.  Fact Sheet for Patients:  EntrepreneurPulse.com.au  Fact Sheet for Healthcare Providers:  IncredibleEmployment.be  This test is no t yet approved or cleared by the [place] FDA and  has been authorized for detection and/or diagnosis of SARS-CoV-2 by FDA under an Emergency Use Authorization (EUA). This EUA will remain  in effect (meaning this test can be used) for the duration of the COVID-19 declaration under Section 564(b)(1) of the Act, 21 U.S.C.section 360bbb-3(b)(1), unless the authorization is terminated  or revoked sooner.       Influenza A by PCR NEGATIVE NEGATIVE Final   Influenza B by PCR NEGATIVE NEGATIVE Final     Comment: (NOTE) The Xpert Xpress SARS-CoV-2/FLU/RSV plus assay is intended as an aid in the diagnosis of influenza from Nasopharyngeal swab specimens and should not be used as a sole basis for treatment. Nasal washings and aspirates are unacceptable for Xpert Xpress SARS-CoV-2/FLU/RSV testing.  Fact Sheet for Patients: EntrepreneurPulse.com.au  Fact Sheet for Healthcare Providers: IncredibleEmployment.be  This test is not yet approved or cleared by the [place] FDA and has been authorized for detection and/or diagnosis of SARS-CoV-2 by FDA under an Emergency Use Authorization (EUA). This EUA will remain in effect (meaning this test can be used) for the duration of the COVID-19 declaration under Section 564(b)(1) of the Act, 21 U.S.C. section 360bbb-3(b)(1), unless the authorization  is terminated or revoked.  Performed at [facility], [address] [place], [place] [address]     Radiology Studies: CARDIAC CATHETERIZATION  Result Date: 04/04/2021   Prox RCA lesion is 20% stenosed.   Dist RCA lesion is 20% stenosed.   Mid LAD lesion is 40% stenosed. 1.  Superdominant right coronary artery with relatively small LAD.  The LAD has moderate mid stenosis.  Overall, no evidence of obstructive coronary artery disease. 2.  Left ventricular angiography was not performed due to chronic kidney disease.  EF was normal by echo. 3.  Mildly to moderately elevated left ventricular end-diastolic pressure at 22 mmHg. Recommendations: no obstructive coronary artery disease to explain ventricular tachycardia.  Elevated troponin is likely due to supply demand ischemia in the setting of sustained ventricular tachycardia requiring cardioversion. I consulted EP for management of this patient.   ECHOCARDIOGRAM COMPLETE  Result Date: 04/03/2021    ECHOCARDIOGRAM REPORT   Patient Name:   DONEVIN WEHRENBERG [facility] Date of Exam: 04/03/2021 Medical Rec #:  [id number]                 Height:       64.5 in Accession #:    [id number]               Weight:       192.0 lb Date of Birth:  08/23/1935               BSA:          1.933 m Patient Age:    85 years                 BP:           120/80 mmHg Patient Gender: M                        HR:           77 bpm. Exam Location:  [facility] Procedure: 2D Echo, Cardiac Doppler and Color Doppler Indications:    I47.2 Ventricular tachycardia; I20.9 Angina pectoris,                 unspecified  History:        Patient has prior history of Echocardiogram examinations. Angina                 and CAD; Signs/Symptoms:Chest Pain.  Sonographer:    Neysa Bonito Roar Referring Phys: [id number] Raymon Mutton DUNN IMPRESSIONS  1. Left ventricular ejection fraction, by estimation, is 55 to 60%. The left ventricle has normal function. The left ventricle has no regional wall motion abnormalities. There is mild left ventricular hypertrophy. Left ventricular diastolic parameters are consistent with Grade I diastolic dysfunction (impaired relaxation).  2. Right ventricular systolic function is normal. The right ventricular size is normal. There is mildly elevated pulmonary artery systolic pressure. The estimated right ventricular systolic pressure is 38.4 mmHg.  3. Left atrial size was mildly dilated.  4. The mitral valve is normal in structure. Moderate mitral valve regurgitation. No evidence of mitral stenosis.  5. Tricuspid valve regurgitation is mild to moderate.  6. The aortic valve was not well visualized. Aortic valve regurgitation is mild. No aortic stenosis is present.  7. The inferior vena cava is normal in size with greater than 50% respiratory variability, suggesting right atrial pressure of 3 mmHg. FINDINGS  Left Ventricle: Left ventricular ejection fraction, by estimation, is 55 to 60%. The left ventricle  has normal function. The left ventricle has no regional wall motion abnormalities. The left ventricular internal cavity size was normal in size. There is  mild  left ventricular hypertrophy. Left ventricular diastolic parameters are consistent with Grade I diastolic dysfunction (impaired relaxation). Right Ventricle: The right ventricular size is normal. No increase in right ventricular wall thickness. Right ventricular systolic function is normal. There is mildly elevated pulmonary artery systolic pressure. The tricuspid regurgitant velocity is 2.89  m/s, and with an assumed right atrial pressure of 5 mmHg, the estimated right ventricular systolic pressure is [id number] mmHg. Left Atrium: Left atrial size was mildly dilated. Right Atrium: Right atrial size was normal in size. Pericardium: There is no evidence of pericardial effusion. Mitral Valve: The mitral valve is normal in structure. Moderate mitral valve regurgitation. No evidence of mitral valve stenosis. Tricuspid Valve: The tricuspid valve is normal in structure. Tricuspid valve regurgitation is mild to moderate. No evidence of tricuspid stenosis. Aortic Valve: The aortic valve was not well visualized. Aortic valve regurgitation is mild. No aortic stenosis is present. Aortic valve peak gradient measures 12.2 mmHg. Pulmonic Valve: The pulmonic valve was normal in structure. Pulmonic valve regurgitation is mild. No evidence of pulmonic stenosis. Aorta: The aortic root is normal in size and structure. Venous: The inferior vena cava is normal in size with greater than 50% respiratory variability, suggesting right atrial pressure of 3 mmHg. IAS/Shunts: No atrial level shunt detected by color flow Doppler.  LEFT VENTRICLE PLAX 2D LVIDd:         4.50 cm Diastology LVIDs:         3.40 cm LV e' medial:    6.31 cm/s LV PW:         1.20 cm LV E/e' medial:  16.0 LV IVS:        1.20 cm LV e' lateral:   8.49 cm/s                        LV E/e' lateral: 11.9  RIGHT VENTRICLE RV Mid diam:    2.70 cm RV S prime:     12.70 cm/s TAPSE (M-mode): 2.1 cm LEFT ATRIUM             Index        RIGHT ATRIUM           Index LA diam:        4.50 cm  2.33 cm/m   RA Area:     12.60 cm LA Vol (A2C):   48.5 ml 25.09 ml/m  RA Volume:   25.10 ml  12.98 ml/m LA Vol (A4C):   55.9 ml 28.92 ml/m LA Biplane Vol: 53.1 ml 27.47 ml/m  AORTIC VALVE              PULMONIC VALVE AV Vmax:      175.00 cm/s PV Vmax:          1.02 m/s AV Peak Grad: 12.2 mmHg   PV Peak grad:     4.2 mmHg LVOT Vmax:    93.60 cm/s  PR End Diast Vel: 6.97 msec                           RVOT Peak grad:   1 mmHg AORTA Ao Asc diam: 3.10 cm MITRAL VALVE                TRICUSPID VALVE MV Area (PHT): 4.65 cm  TR Peak grad:   33.4 mmHg MV Decel Time: 163 msec     TR Vmax:        289.00 cm/s MV E velocity: 101.00 cm/s MV A velocity: 119.00 cm/s MV E/A ratio:  0.85 MV A Prime:    10.8 cm/s Ida Rogue MD Electronically signed by Ida Rogue MD Signature Date/Time: 04/03/2021/2:42:58 PM    Final       Delanee Xin T. [place]  If 7PM-7AM, please contact night-coverage [email or web address] 04/04/2021, 2:29 PM

## 2021-04-04 NOTE — Plan of Care (Signed)

## 2021-04-05 DIAGNOSIS — E039 Hypothyroidism, unspecified: Secondary | ICD-10-CM | POA: Diagnosis not present

## 2021-04-05 DIAGNOSIS — I440 Atrioventricular block, first degree: Secondary | ICD-10-CM

## 2021-04-05 DIAGNOSIS — I5032 Chronic diastolic (congestive) heart failure: Secondary | ICD-10-CM | POA: Diagnosis not present

## 2021-04-05 DIAGNOSIS — I214 Non-ST elevation (NSTEMI) myocardial infarction: Secondary | ICD-10-CM | POA: Diagnosis not present

## 2021-04-05 DIAGNOSIS — N179 Acute kidney failure, unspecified: Secondary | ICD-10-CM | POA: Diagnosis not present

## 2021-04-05 DIAGNOSIS — I472 Ventricular tachycardia, unspecified: Secondary | ICD-10-CM | POA: Diagnosis not present

## 2021-04-05 LAB — GLUCOSE, CAPILLARY
Glucose-Capillary: 103 mg/dL — ABNORMAL HIGH (ref 70–99)
Glucose-Capillary: 112 mg/dL — ABNORMAL HIGH (ref 70–99)
Glucose-Capillary: 200 mg/dL — ABNORMAL HIGH (ref 70–99)
Glucose-Capillary: 130 mg/dL — ABNORMAL HIGH (ref 70–99)

## 2021-04-05 LAB — RENAL FUNCTION PANEL
Albumin: 3.5 g/dL (ref 3.5–5.0)
Anion gap: 7 (ref 5–15)
BUN: 26 mg/dL — ABNORMAL HIGH (ref 8–23)
CO2: 22 mmol/L (ref 22–32)
Calcium: 8.8 mg/dL — ABNORMAL LOW (ref 8.9–10.3)
Chloride: 105 mmol/L (ref 98–111)
Creatinine, Ser: 1.64 mg/dL — ABNORMAL HIGH (ref 0.61–1.24)
GFR, Estimated: 41 mL/min — ABNORMAL LOW (ref >60)
Glucose, Bld: 107 mg/dL — ABNORMAL HIGH (ref 70–99)
Phosphorus: 3.6 mg/dL (ref 2.5–4.6)
Potassium: 4.3 mmol/L (ref 3.5–5.1)
Sodium: 134 mmol/L — ABNORMAL LOW (ref 135–145)

## 2021-04-05 LAB — MAGNESIUM: Magnesium: 1.7 mg/dL (ref 1.7–2.4)

## 2021-04-05 MED ORDER — MAGNESIUM SULFATE 2 GM/50ML IV SOLN
2.0000 g | Freq: Once | INTRAVENOUS | Status: AC
  Administered 2021-04-05: 14:00:00 2 g via INTRAVENOUS
  Filled 2021-04-05: qty 50

## 2021-04-05 NOTE — Progress Notes (Signed)
PROGRESS NOTE  Gerald Hodges [id number] DOB: 04-18-1935   PCP: Venita Lick, NP  Patient is from: Home.  Independently ambulates at baseline.  DOA: 04/02/2021 LOS: 3  Chief complaints:  Chief Complaint  Patient presents with   Chest Pain     Brief Narrative / Interim history: 69 old Spanish-speaking male with PMH of CAD/stent, [id number], diastolic CHF, HTN, HLD, gout, GERD and hypothyroidism presented to ED with new onset chest pain, palpitation, presyncope, diaphoresis and pallor and found to have monomorphic V. tach with HR to 220s but converted to NSR after synchronized cardioversion.  Repeat EKG with sinus rhythm, bigeminy and first-degree AVB.  Troponin trended from 18-4000.  BNP 179.  CXR with pulmonary vascular congestion but no overt edema.  Cardiology consulted.  He was started on IV heparin and beta-blocker.  TTE with LVEF of 55 to 60%, G1 DD, no RWMA, moderate MVR, mild to moderate TVR and RVSP of 38 mmHg.Marland Kitchen  LHC with nonobstructive CAD (see details below).  Cardiology recommended medical management.  Electrophysiology consulted.  Subjective: Seen and examined earlier this morning with the help of video interpreter with ID number [id number].  Patient's wife at bedside.  Patient has no complaints.  He denies chest pain, dyspnea, GI or UTI symptoms.  Some discomfort from IV sites.  Objective: Vitals:   04/04/21 2355 04/05/21 0425 04/05/21 0831 04/05/21 1214  BP: 97/64 (!) 94/53 127/79 99/68  Pulse: 61 (!) 55 (!) 57 (!) 56  Resp: 16 18 20 20   Temp: 98.1 F (36.7 C) 98.1 F (36.7 C) 98.2 F (36.8 C) 98.1 F (36.7 C)  TempSrc:  Oral    SpO2: 96% 98% 99% 97%  Weight:        Examination:  GENERAL: No apparent distress.  Nontoxic. HEENT: MMM.  Vision and hearing grossly intact.  NECK: Supple.  No apparent JVD.  RESP:  No IWOB.  Fair aeration bilaterally. CVS:  RRR. Heart sounds normal.  ABD/GI/GU: BS+. Abd soft, NTND.  MSK/EXT:  Moves extremities. No  apparent deformity. No edema.  SKIN: no apparent skin lesion or wound NEURO: Awake and alert. Oriented appropriately.  No apparent focal neuro deficit. PSYCH: Calm. Normal affect.   Procedures:  04/04/2021-LHC with nonobstructive CAD  Microbiology summarized: COVID-19 and influenza PCR nonreactive.  Assessment & Plan: Sustained monomorphic V. tach with near syncope-s/p successful synchronized cardioversion.  Repeat EKG with first-degree AV block and bigeminy'.  TSH elevated to 11 although his TSH was only 3.86 about 4 days ago.  TTE and LHC as above.  Undiagnosed sleep apnea? -Started on amiodarone. -Electrophysiology consulted by cardiology. -On carvedilol 6.25 mg twice daily -Optimize electrolytes-IV magnesium sulfate 2 g x 1 today -Could benefit from sleep study.  Non-STEMI/typical chest pain/nonobstructive CAD: Patient had exertional chest pain, diaphoresis and arrhythmia as above.  Currently chest pain-free.  Troponin peaked at 4500.  TTE and LHC as above -Continue IV heparin, Imdur, Coreg, Cozaar, statin, Plavix  Chronic diastolic CHF: Appears euvolemic on exam. TTE as above.  Appears euvolemic on exam. -Continue holding Lasix. -Monitor fluid status  First-degree AV block -Would be cautious with nodal blocking agents.  Elevated LFT/hyperbilirubinemia-likely ischemic insult.  Denies alcohol.  CK normal.  Resolved.   AKI on CKD-3A: Creatinine rising again. Recent Labs    05/26/20 1001 09/10/20 1700 12/13/20 0921 12/14/20 1044 01/27/21 1006 04/02/21 1124 04/03/21 0706 04/04/21 0551 04/05/21 0610  BUN 30* 41* 28* 25* 20 23 21  24* 26*  CREATININE 1.57* 1.88* 1.37*  1.30* 1.47* 1.84* 1.30* 1.54* 1.64*  -Consider decreasing or stopping losartan -Lasix on hold.  Essential hypertension: Normotensive -Continue  Coreg, Imdur and Norvasc -Consider holding losartan in the setting of AKI.  Hyperglycemia: Denies history of diabetes.  A1c 6.0%. Recent Labs  Lab  04/04/21 1200 04/04/21 1640 04/04/21 2144 04/05/21 0833 04/05/21 1215  GLUCAP 113* 107* 152* 103* 112*  -Continue SSI-sensitive  Hypothyroidism: TSH elevated to 11 but 3.8 about 4 days ago. -Continue home Synthroid.  History of gout: Stable.  Hyperlipidemia -Continue statin  GERD -Continue PPI  Class I obesity Body mass index is 32.45 kg/m.         DVT prophylaxis:  enoxaparin (LOVENOX) injection 40 mg Start: 04/05/21 0800   Code Status: Full code Family Communication: Updated patient's wife at bedside. Level of care: Telemetry Cardiac.   Status is: Inpatient  Remains inpatient appropriate because: Non-STEMI   Final disposition: Likely home on 1/25 once cleared by cardiology.    Consultants:  Cardiology Electrophysiology   Sch Meds:  Scheduled Meds:  allopurinol  100 mg Oral Daily   amiodarone  400 mg Oral BID   carvedilol  6.25 mg Oral BID WC   cholecalciferol  1,000 Units Oral Daily   clopidogrel  75 mg Oral Daily   enoxaparin (LOVENOX) injection  40 mg Subcutaneous Q24H   insulin aspart  0-9 Units Subcutaneous TID WC   isosorbide mononitrate  90 mg Oral Daily   levothyroxine  75 mcg Oral Daily   loratadine  10 mg Oral Daily   losartan  50 mg Oral Daily   pantoprazole  40 mg Oral Daily   rosuvastatin  10 mg Oral Q M,W,F   sodium chloride flush  3 mL Intravenous Q12H   triamcinolone cream  1 application Topical BID   Continuous Infusions:  sodium chloride     PRN Meds:.sodium chloride, acetaminophen **OR** acetaminophen, magnesium hydroxide, meclizine, nitroGLYCERIN, [DISCONTINUED] ondansetron **OR** ondansetron (ZOFRAN) IV, sodium chloride flush  Antimicrobials: Anti-infectives (From admission, onward)    None        I have personally reviewed the following labs and images: CBC: Recent Labs  Lab 04/02/21 1124 04/03/21 0706 04/04/21 0551  WBC 7.1 5.8 5.2  NEUTROABS 1.9  --   --   HGB 14.2 12.2* 13.0  HCT 43.0 36.2* 37.5*  MCV  93.7 90.5 91.7  PLT 294 216 234   BMP &GFR Recent Labs  Lab 04/02/21 1124 04/03/21 0706 04/04/21 0551 04/05/21 0610  NA 134* 135 138 134*  K 4.5 4.4 4.3 4.3  CL 103 108 108 105  CO2 18* 21* 25 22  GLUCOSE 291* 91 98 107*  BUN 23 21 24* 26*  CREATININE 1.84* 1.30* 1.54* 1.64*  CALCIUM 9.3 8.4* 8.9 8.8*  MG 1.9 1.9 1.8 1.7  PHOS  --   --  3.1 3.6   Estimated Creatinine Clearance: 34.3 mL/min (A) (by C-G formula based on SCr of 1.64 mg/dL (H)). Liver & Pancreas: Recent Labs  Lab 04/02/21 1124 04/03/21 0706 04/04/21 0551 04/05/21 0610  AST 74* 54* 35  --   ALT 49* 45* 37  --   ALKPHOS 126 89 89  --   BILITOT 1.4* 1.3* 1.1  --   PROT 7.1 6.3* 6.6  --   ALBUMIN 3.9 3.7 3.8 3.5   No results for input(s): LIPASE, AMYLASE in the last 168 hours. No results for input(s): AMMONIA in the last 168 hours. Diabetic: Recent Labs    04/02/21 1532  HGBA1C 6.0*   Recent Labs  Lab 04/04/21 1200 04/04/21 1640 04/04/21 2144 04/05/21 0833 04/05/21 1215  GLUCAP 113* 107* 152* 103* 112*   Cardiac Enzymes: Recent Labs  Lab 04/03/21 0706  CKTOTAL 194   No results for input(s): PROBNP in the last 8760 hours. Coagulation Profile: Recent Labs  Lab 04/02/21 1147  INR 1.1   Thyroid Function Tests: No results for input(s): TSH, T4TOTAL, FREET4, T3FREE, THYROIDAB in the last 72 hours.  Lipid Profile: Recent Labs    04/04/21 0551  CHOL 140  HDL 43  LDLCALC 78  TRIG 94  CHOLHDL 3.3   Anemia Panel: No results for input(s): VITAMINB12, FOLATE, FERRITIN, TIBC, IRON, RETICCTPCT in the last 72 hours. Urine analysis:    Component Value Date/Time   COLORURINE STRAW (A) 10/16/2016 1054   APPEARANCEUR Clear 11/03/2019 0940   LABSPEC 1.010 10/16/2016 1054   PHURINE 6.0 10/16/2016 1054   GLUCOSEU Negative 11/03/2019 0940   HGBUR NEGATIVE 10/16/2016 1054   BILIRUBINUR Negative 11/03/2019 0940   KETONESUR NEGATIVE 10/16/2016 1054   PROTEINUR Trace (A) 11/03/2019 0940    PROTEINUR NEGATIVE 10/16/2016 1054   NITRITE Negative 11/03/2019 0940   NITRITE NEGATIVE 10/16/2016 1054   LEUKOCYTESUR Negative 11/03/2019 0940   Sepsis Labs: Invalid input(s): PROCALCITONIN, [place]  Microbiology: Recent Results (from the past 240 hour(s))  Resp Panel by RT-PCR (Flu A&B, Covid) Nasopharyngeal Swab     Status: None   Collection Time: 04/02/21 11:35 AM   Specimen: Nasopharyngeal Swab; Nasopharyngeal(NP) swabs in vial transport medium  Result Value Ref Range Status   SARS Coronavirus 2 by RT PCR NEGATIVE NEGATIVE Final    Comment: (NOTE) SARS-CoV-2 target nucleic acids are NOT DETECTED.  The SARS-CoV-2 RNA is generally detectable in upper respiratory specimens during the acute phase of infection. The lowest concentration of SARS-CoV-2 viral copies this assay can detect is 138 copies/mL. A negative result does not preclude SARS-Cov-2 infection and should not be used as the sole basis for treatment or other patient management decisions. A negative result may occur with  improper specimen collection/handling, submission of specimen other than nasopharyngeal swab, presence of viral mutation(s) within the areas targeted by this assay, and inadequate number of viral copies(<138 copies/mL). A negative result must be combined with clinical observations, patient history, and epidemiological information. The expected result is Negative.  Fact Sheet for Patients:  EntrepreneurPulse.com.au  Fact Sheet for Healthcare Providers:  IncredibleEmployment.be  This test is no t yet approved or cleared by the [place] FDA and  has been authorized for detection and/or diagnosis of SARS-CoV-2 by FDA under an Emergency Use Authorization (EUA). This EUA will remain  in effect (meaning this test can be used) for the duration of the COVID-19 declaration under Section 564(b)(1) of the Act, 21 U.S.C.section 360bbb-3(b)(1), unless the  authorization is terminated  or revoked sooner.       Influenza A by PCR NEGATIVE NEGATIVE Final   Influenza B by PCR NEGATIVE NEGATIVE Final    Comment: (NOTE) The Xpert Xpress SARS-CoV-2/FLU/RSV plus assay is intended as an aid in the diagnosis of influenza from Nasopharyngeal swab specimens and should not be used as a sole basis for treatment. Nasal washings and aspirates are unacceptable for Xpert Xpress SARS-CoV-2/FLU/RSV testing.  Fact Sheet for Patients: EntrepreneurPulse.com.au  Fact Sheet for Healthcare Providers: IncredibleEmployment.be  This test is not yet approved or cleared by the [place] FDA and has been authorized for detection and/or diagnosis of SARS-CoV-2 by FDA under an  Emergency Use Authorization (EUA). This EUA will remain in effect (meaning this test can be used) for the duration of the COVID-19 declaration under Section 564(b)(1) of the Act, 21 U.S.C. section 360bbb-3(b)(1), unless the authorization is terminated or revoked.  Performed at [facility], [address], [place], [place] [address]     Radiology Studies: No results found.    Larysa Pall T. Milton  If 7PM-7AM, please contact night-coverage [email or web address] 04/05/2021, 3:30 PM

## 2021-04-05 NOTE — Progress Notes (Signed)
Wrong person validated- still on screen

## 2021-04-05 NOTE — Consult Note (Signed)
ELECTROPHYSIOLOGY CONSULT NOTE  Patient ID: Gerald Hodges, MRN: [id number], DOB/AGE: 10-23-1935 86 y.o. Admit date: 04/02/2021 Date of Consult: 04/05/2021  Primary Physician: Marjie Skiff, NP Primary Cardiologist: CE Trevond Tanguma Gerald Salles is a 86 y.o. male who is being seen today for the evaluation of VT at the request of MA.   Chief Complaint: VT   HPI Swain Hodges is a 86 y.o. male seen in consultation for sustained VT  Presented with diaphoresis, chest pain and near syncope>> ER  VT monomorphic HR 220>>DCCV  He has had 2 prior episodes 1 in 2015 and 1 in 2016, these were significantly worse.  Unfortunately I was not able to glean clearly whether the prior episodes were significantly similar or more dissimilar.  I think one of them prompted a [place]  Started on amio  Long history of low-grade chest pain.  Ascribed to "angina" not withstanding the lack of a definitive diagnosis, appreciated by providers that it was based on a low risk Myoview scan with a perfusion defect.  Chronic dyspnea, perhaps aggravated by COVID-pneumonia 8/20.     DATE TEST EF   10/21 Echo   55-65 %   1//23 Echo  55-60 %   1/23 LHC  Non obstructive CAD   Date Cr K Hgb  1/23 1.3 4.4 14.2   Event Recorder personnally reviewed  3% PVCS  VT NS Past Medical History:  Diagnosis Date   (HFpEF) heart failure with preserved ejection fraction (HCC)    a.) MPI 12/12/2019 --> EF 42%. b.) TTE 12/17/2019 --> EF 55-60%.   Abdominal aortic atherosclerosis (HCC)    a.) MPI 12/12/2019--> very mild.   Anginal pain (HCC)    Chronic anticoagulation    a.) clopidogrel   CKD (chronic kidney disease), stage III (HCC)    Coronary artery disease    Diastolic dysfunction    a.) TTE 12/17/2019 --> LVEF 55-60%; G2DD.   GERD (gastroesophageal reflux disease)    Gout    History of 2019 novel coronavirus disease (COVID-19) 09/10/2020   Hyperlipidemia     Hypertension    Hypothyroidism    Osteoarthritis of left knee    Psoriasis    Recurrent umbilical hernia    a.) repaired 01/01/2020; recurred.   SNHL (sensory-neural hearing loss), asymmetrical    TIA (transient ischemic attack)    Valvular regurgitation    a.) TTE 12/17/2019 --> EF 55-60%; mild AR, TR      Surgical History:  Past Surgical History:  Procedure Laterality Date   BUNIONECTOMY Bilateral    CATARACT EXTRACTION W/ INTRAOCULAR LENS  IMPLANT, BILATERAL Bilateral    LEFT HEART CATH AND CORONARY ANGIOGRAPHY N/A 04/04/2021   Procedure: LEFT HEART CATH AND CORONARY ANGIOGRAPHY;  Surgeon: Iran Ouch, MD;  Location: [facility] INVASIVE CV LAB;  Service: Cardiovascular;  Laterality: N/A;   TOTAL KNEE ARTHROPLASTY Left 10/25/2016   Procedure: TOTAL KNEE ARTHROPLASTY;  Surgeon: Deeann Saint, MD;  Location: [facility] ORS;  Service: Orthopedics;  Laterality: Left;   UMBILICAL HERNIA REPAIR N/A 01/01/2020   Procedure: HERNIA REPAIR UMBILICAL ADULT;  Surgeon: Henrene Dodge, MD;  Location: [facility] ORS;  Service: General;  Laterality: N/A;   XI ROBOTIC ASSISTED INGUINAL HERNIA REPAIR WITH MESH Left 01/01/2020   Procedure: XI ROBOTIC ASSISTED INGUINAL HERNIA REPAIR WITH MESH;  Surgeon: Henrene Dodge, MD;  Location: [facility] ORS;  Service: General;  Laterality: Left;     Home Meds: Prior to Admission medications   Medication Sig  Start Date End Date Taking? Authorizing Provider  acetaminophen (TYLENOL) 500 MG tablet Take 2 tablets (1,000 mg total) by mouth every 6 (six) hours as needed for mild pain. 12/14/20  Yes Piscoya, Jacqulyn Bath, MD  allopurinol (ZYLOPRIM) 100 MG tablet TAKE 1 TABLET(100 MG) BY MOUTH DAILY 07/27/20  Yes Cannady, Jolene T, NP  amLODipine (NORVASC) 5 MG tablet Take 1 tablet (5 mg total) by mouth daily. 07/27/20  Yes Cannady, Jolene T, NP  carvedilol (COREG) 6.25 MG tablet TAKE 1 TABLET(6.25 MG) BY MOUTH TWICE DAILY WITH A MEAL 01/27/21  Yes Cannady, Jolene T, NP  clopidogrel (PLAVIX) 75 MG  tablet Take 1 tablet (75 mg total) by mouth daily. 07/27/20  Yes Cannady, Jolene T, NP  furosemide (LASIX) 40 MG tablet Take 1 tablet (40 mg total) by mouth daily as needed for edema (weight gain, shortness of breath). 07/27/20  Yes Cannady, Jolene T, NP  isosorbide mononitrate (IMDUR) 30 MG 24 hr tablet Take by mouth. 03/22/21  Yes [provider]  levothyroxine (SYNTHROID) 75 MCG tablet Take 1 tablet (75 mcg total) by mouth daily. 01/28/21  Yes Cannady, Jolene T, NP  loratadine (CLARITIN) 10 MG tablet Take 1 tablet (10 mg total) by mouth daily. 03/30/21  Yes Cannady, Jolene T, NP  losartan (COZAAR) 50 MG tablet Take 1 tablet (50 mg total) by mouth daily. 01/27/21  Yes Cannady, Henrine Screws T, NP  meclizine (ANTIVERT) 12.5 MG tablet Take 1 tablet (12.5 mg total) by mouth 2 (two) times daily as needed for dizziness. 01/11/21 04/11/21 Yes Dunn, Areta Haber, PA-C  nitroGLYCERIN (NITROSTAT) 0.4 MG SL tablet Place 1 tablet (0.4 mg total) under the tongue every 5 (five) minutes as needed for chest pain (chest pain). Maximum of 3 doses. 04/11/19  Yes Dunn, Areta Haber, PA-C  pantoprazole (PROTONIX) 40 MG tablet Take 1 tablet (40 mg total) by mouth daily. 07/27/20  Yes Cannady, Henrine Screws T, NP  rosuvastatin (CRESTOR) 10 MG tablet Take 10 MG (one tablet) by mouth 3 days a week (Monday, Wednesday, Friday). 07/28/20  Yes Cannady, Jolene T, NP  triamcinolone cream (KENALOG) 0.1 % Apply 1 application topically 2 (two) times daily. 03/30/21  Yes Cannady, Jolene T, NP  VITAMIN D-1000 MAX ST 25 MCG (1000 UT) tablet Take 1,000 Units by mouth daily. 03/22/21  Yes [provider]  isosorbide mononitrate (IMDUR) 60 MG 24 hr tablet Take 1.5 tablets (90 mg total) by mouth daily. Patient not taking: Reported on 04/02/2021 01/27/21 04/27/21  Marnee Guarneri T, NP    Inpatient Medications:   allopurinol  100 mg Oral Daily   amiodarone  400 mg Oral BID   carvedilol  6.25 mg Oral BID WC   cholecalciferol  1,000 Units Oral Daily    clopidogrel  75 mg Oral Daily   enoxaparin (LOVENOX) injection  40 mg Subcutaneous Q24H   insulin aspart  0-9 Units Subcutaneous TID WC   isosorbide mononitrate  90 mg Oral Daily   levothyroxine  75 mcg Oral Daily   loratadine  10 mg Oral Daily   losartan  50 mg Oral Daily   pantoprazole  40 mg Oral Daily   rosuvastatin  10 mg Oral Q M,W,F   sodium chloride flush  3 mL Intravenous Q12H   triamcinolone cream  1 application Topical BID      Allergies:  Allergies  Allergen Reactions   Aspirin Cough   Lisinopril Itching    Throat itching     Social History   Socioeconomic History  Marital status: Married    Spouse name: Not on file   Number of children: Not on file   Years of education: Not on file   Highest education level: Not on file  Occupational History   Not on file  Tobacco Use   Smoking status: Never   Smokeless tobacco: Never  Vaping Use   Vaping Use: Never used  Substance and Sexual Activity   Alcohol use: No   Drug use: No   Sexual activity: Not Currently  Other Topics Concern   Not on file  Social History Narrative   Not on file   Social Determinants of Health   Financial Resource Strain: Not on file  Food Insecurity: Not on file  Transportation Needs: Not on file  Physical Activity: Not on file  Stress: Not on file  Social Connections: Not on file  Intimate Partner Violence: Not on file     Family History  Problem Relation Age of Onset   Cancer Mother        stomach   Heart disease Neg Hx      ROS:  Please see the history of present illness.     All other systems reviewed and negative.    Physical Exam: Blood pressure (!) 94/53, pulse (!) 55, temperature 98.1 F (36.7 C), temperature source Oral, resp. rate 18, weight 87.1 kg, SpO2 98 %. General: Well developed, well nourished male in no acute distress. Head: Normocephalic, atraumatic, sclera non-icteric, no xanthomas, nares are without discharge. EENT: normal Lymph Nodes:   none Back: without scoliosis/kyphosis, no CVA tendersness Neck: Negative for carotid bruits. JVD not elevated. Lungs: Clear bilaterally to auscultation without wheezes, rales, or rhonchi. Breathing is unlabored. Heart: RRR with S1 S2. No  murmur , rubs, or gallops appreciated. Abdomen: Soft, non-tender, non-distended with normoactive bowel sounds. No hepatomegaly. No rebound/guarding. No obvious abdominal masses. Msk:  Strength and tone appear normal for age. Extremities: No clubbing or cyanosis. No edema.  Distal pedal pulses are 2+ and equal bilaterally. Skin: Warm and Dry Neuro: Alert and oriented X 3. CN III-XII intact Grossly normal sensory and motor function . Psych:  Responds to questions appropriately with a normal affect.      Labs: Cardiac Enzymes Recent Labs    04/03/21 0706  CKTOTAL 194   CBC Lab Results  Component Value Date   WBC 5.2 04/04/2021   HGB 13.0 04/04/2021   HCT 37.5 (L) 04/04/2021   MCV 91.7 04/04/2021   PLT 234 04/04/2021   PROTIME: Recent Labs    04/02/21 1147  LABPROT 14.3  INR 1.1   Chemistry  Recent Labs  Lab 04/04/21 0551 04/05/21 0610  NA 138 134*  K 4.3 4.3  CL 108 105  CO2 25 22  BUN 24* 26*  CREATININE 1.54* 1.64*  CALCIUM 8.9 8.8*  PROT 6.6  --   BILITOT 1.1  --   ALKPHOS 89  --   ALT 37  --   AST 35  --   GLUCOSE 98 107*   Lipids Lab Results  Component Value Date   CHOL 140 04/04/2021   HDL 43 04/04/2021   LDLCALC 78 04/04/2021   TRIG 94 04/04/2021   BNP No results found for: PROBNP Thyroid Function Tests: Recent Labs    04/02/21 1239  TSH 11.193*      Miscellaneous No results found for: DDIMER  Radiology/Studies:    EKG: 1/23 sinus at 61 Intervals 26/08/43 PACs-frequent  ECG 1/23 ventricular tachycardia-right bundle branch  block superior axis with monophasic upright aVR/aVL and negative precordial concordance  Assessment and Plan:  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  Hypertension  Hypothyroidism  The patient has monomorphic ventricular tachycardia almost flutter at a rate of about 230-40 bpm.  Not surprisingly it was not hemodynamically tolerated.  Interestingly, the history of prior similar episodes with tachy palpitations suggest mechanistically that there is structural heart disease although the resolution of an echo.  Moreover, it points away from something like giant cell myocarditis which can be associated with ventricular arrhythmias but is typically an acute illness.  Hence, I think it is reasonable to continue with amiodarone.  We will have to be concerned about his hypothyroidism.  I am inclined also to pursue cMRI to try to elucidate more clearly the issue of structural heart disease here although, I am not sure how it would change therapy in the short-term.  His first-degree AV block may also become an issue with amiodarone therapy.  We will need to follow this.  Reviewed this with the patient's by way of [place] interpreters.  I have tried to reach his son but failed today after I failed to call him yesterday as I had said.  I will try again later   Virl Axe

## 2021-04-06 DIAGNOSIS — I472 Ventricular tachycardia, unspecified: Secondary | ICD-10-CM | POA: Diagnosis not present

## 2021-04-06 DIAGNOSIS — I5032 Chronic diastolic (congestive) heart failure: Secondary | ICD-10-CM | POA: Diagnosis not present

## 2021-04-06 DIAGNOSIS — I2489 Other forms of acute ischemic heart disease: Secondary | ICD-10-CM

## 2021-04-06 DIAGNOSIS — I248 Other forms of acute ischemic heart disease: Secondary | ICD-10-CM | POA: Diagnosis not present

## 2021-04-06 LAB — RENAL FUNCTION PANEL
Albumin: 3.7 g/dL (ref 3.5–5.0)
Anion gap: 7 (ref 5–15)
BUN: 23 mg/dL (ref 8–23)
CO2: 21 mmol/L — ABNORMAL LOW (ref 22–32)
Calcium: 8.7 mg/dL — ABNORMAL LOW (ref 8.9–10.3)
Chloride: 103 mmol/L (ref 98–111)
Creatinine, Ser: 1.59 mg/dL — ABNORMAL HIGH (ref 0.61–1.24)
GFR, Estimated: 43 mL/min — ABNORMAL LOW (ref >60)
Glucose, Bld: 89 mg/dL (ref 70–99)
Phosphorus: 3.6 mg/dL (ref 2.5–4.6)
Potassium: 4.4 mmol/L (ref 3.5–5.1)
Sodium: 131 mmol/L — ABNORMAL LOW (ref 135–145)

## 2021-04-06 LAB — MAGNESIUM: Magnesium: 1.8 mg/dL (ref 1.7–2.4)

## 2021-04-06 LAB — GLUCOSE, CAPILLARY
Glucose-Capillary: 92 mg/dL (ref 70–99)
Glucose-Capillary: 119 mg/dL — ABNORMAL HIGH (ref 70–99)

## 2021-04-06 MED ORDER — ONDANSETRON HCL 4 MG PO TABS: 4.0000 mg | ORAL_TABLET | Freq: Every day | ORAL | 0 refills | Status: AC | PRN

## 2021-04-06 MED ORDER — AMIODARONE HCL 200 MG PO TABS: ORAL_TABLET | ORAL | 0 refills | Status: DC

## 2021-04-06 NOTE — Discharge Summary (Signed)
Physician Discharge Summary  Gerald Hodges [facility] [id number] DOB: 1935/09/21 DOA: 04/02/2021  PCP: Venita Lick, NP  Admit date: 04/02/2021 Discharge date: 04/06/2021  Admitted From: Home Disposition: Home  Recommendations for Outpatient Follow-up:  Follow up with PCP in 1-2 weeks Follow-up with cardiology and EP as directed  Home Health: No Equipment/Devices: None  Discharge Condition: Stable CODE STATUS: Full Diet recommendation: Heart healthy  Brief/Interim Summary: 86 old Spanish-speaking male with PMH of CAD/stent, [id number], diastolic CHF, HTN, HLD, gout, GERD and hypothyroidism presented to ED with new onset chest pain, palpitation, presyncope, diaphoresis and pallor and found to have monomorphic V. tach with HR to 220s but converted to NSR after synchronized cardioversion.  Repeat EKG with sinus rhythm, bigeminy and first-degree AVB.  Troponin trended from 18-4000.  BNP 179.  CXR with pulmonary vascular congestion but no overt edema.  Cardiology consulted.  He was started on IV heparin and beta-blocker.   TTE with LVEF of 55 to 60%, G1 DD, no RWMA, moderate MVR, mild to moderate TVR and RVSP of 38 mmHg.Marland Kitchen  LHC with nonobstructive CAD (see details below).  Cardiology recommended medical management.  Electrophysiology consulted.  Decision made to put patient on tapering amiodarone course.  400 twice daily x14 days, followed by 200 twice daily x14 days, followed by 200 daily.  Patient will follow-up outpatient with cardiology and electrophysiology.  On day of discharge patient having some symptoms of reflux and nausea.  It is possible these are associated with amiodarone.  Will prescribe Zofran for nausea control postdischarge.    Discharge Diagnoses:  Principal Problem:   Sustained VT (ventricular tachycardia) Active Problems:   Nonsustained ventricular tachycardia  Sustained monomorphic V. tach with near syncope-s/p successful synchronized cardioversion.  Repeat EKG  with first-degree AV block and bigeminy'.  TSH elevated to 11 although his TSH was only 3.86 about 4 days ago.  TTE and LHC as above.  Undiagnosed sleep apnea? -Started on amiodarone. -Electrophysiology consulted by cardiology. -On carvedilol 6.25 mg twice daily -On discharge will recommend amiodarone taper.  400 twice daily x14 days, followed by 200 twice daily x2 weeks, followed by 200 daily.  Follow-up outpatient EP and cardiology   Non-STEMI/typical chest pain/nonobstructive CAD: Patient had exertional chest pain, diaphoresis and arrhythmia as above.  Currently chest pain-free.  Troponin peaked at 4500.   -Completed 48 hours of IV heparin.  Will discharge on previous regimen of Coreg, Imdur, aspirin, statin, Plavix   Chronic diastolic CHF: Appears euvolemic on exam. TTE as above.  Appears euvolemic on exam. -Can resume home regimen on time of discharge -Monitor fluid status     Elevated LFT/hyperbilirubinemia-likely ischemic insult.  Denies alcohol.  CK normal.  Resolved.    AKI on CKD-3A:  -Appears improved at time of discharge.  Creatinine at or near baseline.  Outpatient PCP versus nephrology following   Discharge Instructions  Discharge Instructions     Diet - low sodium heart healthy   Complete by: As directed    Increase activity slowly   Complete by: As directed       Allergies as of 04/06/2021       Reactions   Aspirin Cough   Lisinopril Itching   Throat itching        Medication List     STOP taking these medications    amLODipine 5 MG tablet Commonly known as: NORVASC       TAKE these medications    acetaminophen 500 MG tablet Commonly known as: TYLENOL  Take 2 tablets (1,000 mg total) by mouth every 6 (six) hours as needed for mild pain.   allopurinol 100 MG tablet Commonly known as: ZYLOPRIM TAKE 1 TABLET(100 MG) BY MOUTH DAILY   amiodarone 200 MG tablet Commonly known as: PACERONE Take 2 tablets (400 mg total) by mouth 2 (two) times daily  for 12 days, THEN 1 tablet (200 mg total) 2 (two) times daily for 14 days, THEN 1 tablet (200 mg total) daily for 14 days. Start taking on: April 06, 2021   carvedilol 6.25 MG tablet Commonly known as: COREG TAKE 1 TABLET(6.25 MG) BY MOUTH TWICE DAILY WITH A MEAL   clopidogrel 75 MG tablet Commonly known as: PLAVIX Take 1 tablet (75 mg total) by mouth daily.   furosemide 40 MG tablet Commonly known as: LASIX Take 1 tablet (40 mg total) by mouth daily as needed for edema (weight gain, shortness of breath).   isosorbide mononitrate 60 MG 24 hr tablet Commonly known as: IMDUR Take 1.5 tablets (90 mg total) by mouth daily.   isosorbide mononitrate 30 MG 24 hr tablet Commonly known as: IMDUR Take by mouth.   levothyroxine 75 MCG tablet Commonly known as: SYNTHROID Take 1 tablet (75 mcg total) by mouth daily.   loratadine 10 MG tablet Commonly known as: CLARITIN Take 1 tablet (10 mg total) by mouth daily.   losartan 50 MG tablet Commonly known as: COZAAR Take 1 tablet (50 mg total) by mouth daily.   meclizine 12.5 MG tablet Commonly known as: ANTIVERT Take 1 tablet (12.5 mg total) by mouth 2 (two) times daily as needed for dizziness.   nitroGLYCERIN 0.4 MG SL tablet Commonly known as: NITROSTAT Place 1 tablet (0.4 mg total) under the tongue every 5 (five) minutes as needed for chest pain (chest pain). Maximum of 3 doses.   pantoprazole 40 MG tablet Commonly known as: PROTONIX Take 1 tablet (40 mg total) by mouth daily.   rosuvastatin 10 MG tablet Commonly known as: Crestor Take 10 MG (one tablet) by mouth 3 days a week (Monday, Wednesday, Friday).   triamcinolone cream 0.1 % Commonly known as: KENALOG Apply 1 application topically 2 (two) times daily.   Vitamin D-1000 Max St 25 MCG (1000 UT) tablet Generic drug: Cholecalciferol Take 1,000 Units by mouth daily.        Allergies  Allergen Reactions   Aspirin Cough   Lisinopril Itching    Throat itching      Consultations: Cardiology Electrophysiology   Procedures/Studies: CARDIAC CATHETERIZATION  Result Date: 04/04/2021   Prox RCA lesion is 20% stenosed.   Dist RCA lesion is 20% stenosed.   Mid LAD lesion is 40% stenosed. 1.  Superdominant right coronary artery with relatively small LAD.  The LAD has moderate mid stenosis.  Overall, no evidence of obstructive coronary artery disease. 2.  Left ventricular angiography was not performed due to chronic kidney disease.  EF was normal by echo. 3.  Mildly to moderately elevated left ventricular end-diastolic pressure at 22 mmHg. Recommendations: no obstructive coronary artery disease to explain ventricular tachycardia.  Elevated troponin is likely due to supply demand ischemia in the setting of sustained ventricular tachycardia requiring cardioversion. I consulted EP for management of this patient.   DG Chest Portable 1 View  Result Date: 04/02/2021 CLINICAL DATA:  86 year old male ventricular tachycardia status post cardioversion. EXAM: PORTABLE CHEST 1 VIEW COMPARISON:  Chest radiograph 09/15/2020 and earlier. FINDINGS: Portable AP view at levin 39 hours. Lower lung volumes. Pacer pads  project over the chest. Mediastinal contours are stable and within normal limits. Pulmonary vascular congestion without overt edema. Chronic blunting of the left costophrenic angle appears stable from last year. No pneumothorax or consolidation identified. No acute osseous abnormality identified. Visualized tracheal air column is within normal limits. IMPRESSION: Low lung volumes with pulmonary vascular congestion but no overt edema. Electronically Signed   By: Genevie Ann M.D.   On: 04/02/2021 11:58   ECHOCARDIOGRAM COMPLETE  Result Date: 04/03/2021    ECHOCARDIOGRAM REPORT   Patient Name:   WINTHROP LEMELIN [facility] Date of Exam: 04/03/2021 Medical Rec #:  [id number]                Height:       64.5 in Accession #:    [id number]               Weight:       192.0 lb Date of  Birth:  1935-12-19               BSA:          1.933 m Patient Age:    72 years                 BP:           120/80 mmHg Patient Gender: M                        HR:           77 bpm. Exam Location:  [facility] Procedure: 2D Echo, Cardiac Doppler and Color Doppler Indications:    I47.2 Ventricular tachycardia; I20.9 Angina pectoris,                 unspecified  History:        Patient has prior history of Echocardiogram examinations. Angina                 and CAD; Signs/Symptoms:Chest Pain.  Sonographer:    Alyse Low Roar Referring Phys: [id number] [place]  1. Left ventricular ejection fraction, by estimation, is 55 to 60%. The left ventricle has normal function. The left ventricle has no regional wall motion abnormalities. There is mild left ventricular hypertrophy. Left ventricular diastolic parameters are consistent with Grade I diastolic dysfunction (impaired relaxation).  2. Right ventricular systolic function is normal. The right ventricular size is normal. There is mildly elevated pulmonary artery systolic pressure. The estimated right ventricular systolic pressure is [id number] mmHg.  3. Left atrial size was mildly dilated.  4. The mitral valve is normal in structure. Moderate mitral valve regurgitation. No evidence of mitral stenosis.  5. Tricuspid valve regurgitation is mild to moderate.  6. The aortic valve was not well visualized. Aortic valve regurgitation is mild. No aortic stenosis is present.  7. The inferior vena cava is normal in size with greater than 50% respiratory variability, suggesting right atrial pressure of 3 mmHg. FINDINGS  Left Ventricle: Left ventricular ejection fraction, by estimation, is 55 to 60%. The left ventricle has normal function. The left ventricle has no regional wall motion abnormalities. The left ventricular internal cavity size was normal in size. There is  mild left ventricular hypertrophy. Left ventricular diastolic parameters are consistent with Grade I diastolic  dysfunction (impaired relaxation). Right Ventricle: The right ventricular size is normal. No increase in right ventricular wall thickness. Right ventricular systolic function is normal. There is mildly elevated pulmonary artery systolic pressure. The tricuspid  regurgitant velocity is 2.89  m/s, and with an assumed right atrial pressure of 5 mmHg, the estimated right ventricular systolic pressure is [id number] mmHg. Left Atrium: Left atrial size was mildly dilated. Right Atrium: Right atrial size was normal in size. Pericardium: There is no evidence of pericardial effusion. Mitral Valve: The mitral valve is normal in structure. Moderate mitral valve regurgitation. No evidence of mitral valve stenosis. Tricuspid Valve: The tricuspid valve is normal in structure. Tricuspid valve regurgitation is mild to moderate. No evidence of tricuspid stenosis. Aortic Valve: The aortic valve was not well visualized. Aortic valve regurgitation is mild. No aortic stenosis is present. Aortic valve peak gradient measures 12.2 mmHg. Pulmonic Valve: The pulmonic valve was normal in structure. Pulmonic valve regurgitation is mild. No evidence of pulmonic stenosis. Aorta: The aortic root is normal in size and structure. Venous: The inferior vena cava is normal in size with greater than 50% respiratory variability, suggesting right atrial pressure of 3 mmHg. IAS/Shunts: No atrial level shunt detected by color flow Doppler.  LEFT VENTRICLE PLAX 2D LVIDd:         4.50 cm Diastology LVIDs:         3.40 cm LV e' medial:    6.31 cm/s LV PW:         1.20 cm LV E/e' medial:  16.0 LV IVS:        1.20 cm LV e' lateral:   8.49 cm/s                        LV E/e' lateral: 11.9  RIGHT VENTRICLE RV Mid diam:    2.70 cm RV S prime:     12.70 cm/s TAPSE (M-mode): 2.1 cm LEFT ATRIUM             Index        RIGHT ATRIUM           Index LA diam:        4.50 cm 2.33 cm/m   RA Area:     12.60 cm LA Vol (A2C):   48.5 ml 25.09 ml/m  RA Volume:   25.10 ml  12.98  ml/m LA Vol (A4C):   55.9 ml 28.92 ml/m LA Biplane Vol: 53.1 ml 27.47 ml/m  AORTIC VALVE              PULMONIC VALVE AV Vmax:      175.00 cm/s PV Vmax:          1.02 m/s AV Peak Grad: 12.2 mmHg   PV Peak grad:     4.2 mmHg LVOT Vmax:    93.60 cm/s  PR End Diast Vel: 6.97 msec                           RVOT Peak grad:   1 mmHg AORTA Ao Asc diam: 3.10 cm MITRAL VALVE                TRICUSPID VALVE MV Area (PHT): 4.65 cm     TR Peak grad:   33.4 mmHg MV Decel Time: 163 msec     TR Vmax:        289.00 cm/s MV E velocity: 101.00 cm/s MV A velocity: 119.00 cm/s MV E/A ratio:  0.85 MV A Prime:    10.8 cm/s Ida Rogue MD Electronically signed by Ida Rogue MD Signature Date/Time: 04/03/2021/2:42:58 PM    Final  Subjective: Seen and examined at time of discharge.  Stable no distress.  Stable for discharge home  Discharge Exam: Vitals:   04/06/21 0838 04/06/21 1204  BP: 134/72 128/81  Pulse: 61 60  Resp: 20 20  Temp: 97.8 F (36.6 C) 97.9 F (36.6 C)  SpO2: 96% 98%   Vitals:   04/05/21 2328 04/06/21 0433 04/06/21 0838 04/06/21 1204  BP: 115/71 109/75 134/72 128/81  Pulse: (!) 56 (!) 57 61 60  Resp: 18 18 20 20   Temp: 97.9 F (36.6 C) (!) 97.3 F (36.3 C) 97.8 F (36.6 C) 97.9 F (36.6 C)  TempSrc: Oral     SpO2: 97% 98% 96% 98%  Weight:        General: Pt is alert, awake, not in acute distress Cardiovascular: RRR, S1/S2 +, no rubs, no gallops Respiratory: CTA bilaterally, no wheezing, no rhonchi Abdominal: Soft, NT, ND, bowel sounds + Extremities: no edema, no cyanosis    The results of significant diagnostics from this hospitalization (including imaging, microbiology, ancillary and laboratory) are listed below for reference.     Microbiology: Recent Results (from the past 240 hour(s))  Resp Panel by RT-PCR (Flu A&B, Covid) Nasopharyngeal Swab     Status: None   Collection Time: 04/02/21 11:35 AM   Specimen: Nasopharyngeal Swab; Nasopharyngeal(NP) swabs in vial  transport medium  Result Value Ref Range Status   SARS Coronavirus 2 by RT PCR NEGATIVE NEGATIVE Final    Comment: (NOTE) SARS-CoV-2 target nucleic acids are NOT DETECTED.  The SARS-CoV-2 RNA is generally detectable in upper respiratory specimens during the acute phase of infection. The lowest concentration of SARS-CoV-2 viral copies this assay can detect is 138 copies/mL. A negative result does not preclude SARS-Cov-2 infection and should not be used as the sole basis for treatment or other patient management decisions. A negative result may occur with  improper specimen collection/handling, submission of specimen other than nasopharyngeal swab, presence of viral mutation(s) within the areas targeted by this assay, and inadequate number of viral copies(<138 copies/mL). A negative result must be combined with clinical observations, patient history, and epidemiological information. The expected result is Negative.  Fact Sheet for Patients:  EntrepreneurPulse.com.au  Fact Sheet for Healthcare Providers:  IncredibleEmployment.be  This test is no t yet approved or cleared by the [place] FDA and  has been authorized for detection and/or diagnosis of SARS-CoV-2 by FDA under an Emergency Use Authorization (EUA). This EUA will remain  in effect (meaning this test can be used) for the duration of the COVID-19 declaration under Section 564(b)(1) of the Act, 21 U.S.C.section 360bbb-3(b)(1), unless the authorization is terminated  or revoked sooner.       Influenza A by PCR NEGATIVE NEGATIVE Final   Influenza B by PCR NEGATIVE NEGATIVE Final    Comment: (NOTE) The Xpert Xpress SARS-CoV-2/FLU/RSV plus assay is intended as an aid in the diagnosis of influenza from Nasopharyngeal swab specimens and should not be used as a sole basis for treatment. Nasal washings and aspirates are unacceptable for Xpert Xpress SARS-CoV-2/FLU/RSV testing.  Fact  Sheet for Patients: EntrepreneurPulse.com.au  Fact Sheet for Healthcare Providers: IncredibleEmployment.be  This test is not yet approved or cleared by the [place] FDA and has been authorized for detection and/or diagnosis of SARS-CoV-2 by FDA under an Emergency Use Authorization (EUA). This EUA will remain in effect (meaning this test can be used) for the duration of the COVID-19 declaration under Section 564(b)(1) of the Act, 21 U.S.C. section 360bbb-3(b)(1),  unless the authorization is terminated or revoked.  Performed at [facility], [address] [place], [place] [address]      Labs: BNP (last 3 results) Recent Labs    04/02/21 1124  BNP 123456*   Basic Metabolic Panel: Recent Labs  Lab 04/02/21 1124 04/03/21 0706 04/04/21 0551 04/05/21 0610 04/06/21 0708  NA 134* 135 138 134* 131*  K 4.5 4.4 4.3 4.3 4.4  CL 103 108 108 105 103  CO2 18* 21* 25 22 21*  GLUCOSE 291* 91 98 107* 89  BUN 23 21 24* 26* 23  CREATININE 1.84* 1.30* 1.54* 1.64* 1.59*  CALCIUM 9.3 8.4* 8.9 8.8* 8.7*  MG 1.9 1.9 1.8 1.7 1.8  PHOS  --   --  3.1 3.6 3.6   Liver Function Tests: Recent Labs  Lab 04/02/21 1124 04/03/21 0706 04/04/21 0551 04/05/21 0610 04/06/21 0708  AST 74* 54* 35  --   --   ALT 49* 45* 37  --   --   ALKPHOS 126 89 89  --   --   BILITOT 1.4* 1.3* 1.1  --   --   PROT 7.1 6.3* 6.6  --   --   ALBUMIN 3.9 3.7 3.8 3.5 3.7   No results for input(s): LIPASE, AMYLASE in the last 168 hours. No results for input(s): AMMONIA in the last 168 hours. CBC: Recent Labs  Lab 04/02/21 1124 04/03/21 0706 04/04/21 0551  WBC 7.1 5.8 5.2  NEUTROABS 1.9  --   --   HGB 14.2 12.2* 13.0  HCT 43.0 36.2* 37.5*  MCV 93.7 90.5 91.7  PLT 294 216 234   Cardiac Enzymes: Recent Labs  Lab 04/03/21 0706  CKTOTAL 194   BNP: Invalid input(s): POCBNP CBG: Recent Labs  Lab 04/05/21 1215 04/05/21 1659 04/05/21 2051 04/06/21 0839  04/06/21 1206  GLUCAP 112* 200* 130* 92 119*   D-Dimer No results for input(s): DDIMER in the last 72 hours. Hgb A1c No results for input(s): HGBA1C in the last 72 hours. Lipid Profile Recent Labs    04/04/21 0551  CHOL 140  HDL 43  LDLCALC 78  TRIG 94  CHOLHDL 3.3   Thyroid function studies No results for input(s): TSH, T4TOTAL, T3FREE, THYROIDAB in the last 72 hours.  Invalid input(s): FREET3 Anemia work up No results for input(s): VITAMINB12, FOLATE, FERRITIN, TIBC, IRON, RETICCTPCT in the last 72 hours. Urinalysis    Component Value Date/Time   COLORURINE STRAW (A) 10/16/2016 1054   APPEARANCEUR Clear 11/03/2019 0940   LABSPEC 1.010 10/16/2016 1054   PHURINE 6.0 10/16/2016 1054   GLUCOSEU Negative 11/03/2019 0940   HGBUR NEGATIVE 10/16/2016 1054   BILIRUBINUR Negative 11/03/2019 0940   KETONESUR NEGATIVE 10/16/2016 1054   PROTEINUR Trace (A) 11/03/2019 0940   PROTEINUR NEGATIVE 10/16/2016 1054   NITRITE Negative 11/03/2019 0940   NITRITE NEGATIVE 10/16/2016 1054   LEUKOCYTESUR Negative 11/03/2019 0940   Sepsis Labs Invalid input(s): PROCALCITONIN,  WBC,  LACTICIDVEN Microbiology Recent Results (from the past 240 hour(s))  Resp Panel by RT-PCR (Flu A&B, Covid) Nasopharyngeal Swab     Status: None   Collection Time: 04/02/21 11:35 AM   Specimen: Nasopharyngeal Swab; Nasopharyngeal(NP) swabs in vial transport medium  Result Value Ref Range Status   SARS Coronavirus 2 by RT PCR NEGATIVE NEGATIVE Final    Comment: (NOTE) SARS-CoV-2 target nucleic acids are NOT DETECTED.  The SARS-CoV-2 RNA is generally detectable in upper respiratory specimens during the acute phase of infection. The  lowest concentration of SARS-CoV-2 viral copies this assay can detect is 138 copies/mL. A negative result does not preclude SARS-Cov-2 infection and should not be used as the sole basis for treatment or other patient management decisions. A negative result may occur with   improper specimen collection/handling, submission of specimen other than nasopharyngeal swab, presence of viral mutation(s) within the areas targeted by this assay, and inadequate number of viral copies(<138 copies/mL). A negative result must be combined with clinical observations, patient history, and epidemiological information. The expected result is Negative.  Fact Sheet for Patients:  EntrepreneurPulse.com.au  Fact Sheet for Healthcare Providers:  IncredibleEmployment.be  This test is no t yet approved or cleared by the [place] FDA and  has been authorized for detection and/or diagnosis of SARS-CoV-2 by FDA under an Emergency Use Authorization (EUA). This EUA will remain  in effect (meaning this test can be used) for the duration of the COVID-19 declaration under Section 564(b)(1) of the Act, 21 U.S.C.section 360bbb-3(b)(1), unless the authorization is terminated  or revoked sooner.       Influenza A by PCR NEGATIVE NEGATIVE Final   Influenza B by PCR NEGATIVE NEGATIVE Final    Comment: (NOTE) The Xpert Xpress SARS-CoV-2/FLU/RSV plus assay is intended as an aid in the diagnosis of influenza from Nasopharyngeal swab specimens and should not be used as a sole basis for treatment. Nasal washings and aspirates are unacceptable for Xpert Xpress SARS-CoV-2/FLU/RSV testing.  Fact Sheet for Patients: EntrepreneurPulse.com.au  Fact Sheet for Healthcare Providers: IncredibleEmployment.be  This test is not yet approved or cleared by the [place] FDA and has been authorized for detection and/or diagnosis of SARS-CoV-2 by FDA under an Emergency Use Authorization (EUA). This EUA will remain in effect (meaning this test can be used) for the duration of the COVID-19 declaration under Section 564(b)(1) of the Act, 21 U.S.C. section 360bbb-3(b)(1), unless the authorization is terminated  or revoked.  Performed at [facility], [address], [place], [place] [address]      Time coordinating discharge: Over 30 minutes  SIGNED:   Sidney Ace, MD  [facility] 04/06/2021, 12:37 PM Pager   If 7PM-7AM, please contact night-coverage

## 2021-04-06 NOTE — Progress Notes (Signed)
Progress Note  Patient Name: Gerald Hodges Date of Encounter: 04/06/2021  Primary Cardiologist: Nelva Bush, MD  Subjective   Remote interpretation services used this AM.  Pt w/ nausea and wkns this AM.  No c/p or sob.  No recurrent VT.  Inpatient Medications    Scheduled Meds:  allopurinol  100 mg Oral Daily   amiodarone  400 mg Oral BID   carvedilol  6.25 mg Oral BID WC   cholecalciferol  1,000 Units Oral Daily   clopidogrel  75 mg Oral Daily   enoxaparin (LOVENOX) injection  40 mg Subcutaneous Q24H   insulin aspart  0-9 Units Subcutaneous TID WC   isosorbide mononitrate  90 mg Oral Daily   levothyroxine  75 mcg Oral Daily   loratadine  10 mg Oral Daily   losartan  50 mg Oral Daily   pantoprazole  40 mg Oral Daily   rosuvastatin  10 mg Oral Q M,W,F   sodium chloride flush  3 mL Intravenous Q12H   triamcinolone cream  1 application Topical BID   Continuous Infusions:  sodium chloride     PRN Meds: sodium chloride, acetaminophen **OR** acetaminophen, magnesium hydroxide, meclizine, nitroGLYCERIN, [DISCONTINUED] ondansetron **OR** ondansetron (ZOFRAN) IV, sodium chloride flush   Vital Signs    Vitals:   04/05/21 2008 04/05/21 2328 04/06/21 0433 04/06/21 0838  BP: 108/68 115/71 109/75 134/72  Pulse: (!) 55 (!) 56 (!) 57 61  Resp: 20 18 18 20   Temp: 97.7 F (36.5 C) 97.9 F (36.6 C) (!) 97.3 F (36.3 C) 97.8 F (36.6 C)  TempSrc: Oral Oral    SpO2: 96% 97% 98% 96%  Weight:        Intake/Output Summary (Last 24 hours) at 04/06/2021 1055 Last data filed at 04/05/2021 2325 Gross per 24 hour  Intake 3 ml  Output --  Net 3 ml   Filed Weights   04/03/21 2356  Weight: 87.1 kg    Physical Exam   GEN: Well nourished, well developed, in no acute distress.  HEENT: Grossly normal.  Neck: Supple, no JVD, carotid bruits, or masses. Cardiac: RRR, no murmurs, rubs, or gallops. No clubbing, cyanosis, edema.  Radials 2+, DP/PT 1+ and equal bilaterally.  R radial cath site w/o bleeding/bruit/hematoma. Respiratory:  Respirations regular and unlabored, clear to auscultation bilaterally. GI: Soft, nontender, nondistended, BS + x 4. MS: no deformity or atrophy. Skin: warm and dry, no rash. Neuro:  Strength and sensation are intact. Psych: AAOx3.  Normal affect.  Labs    Chemistry Recent Labs  Lab 04/02/21 1124 04/03/21 0706 04/04/21 0551 04/05/21 0610 04/06/21 0708  NA 134* 135 138 134* 131*  K 4.5 4.4 4.3 4.3 4.4  CL 103 108 108 105 103  CO2 18* 21* 25 22 21*  GLUCOSE 291* 91 98 107* 89  BUN 23 21 24* 26* 23  CREATININE 1.84* 1.30* 1.54* 1.64* 1.59*  CALCIUM 9.3 8.4* 8.9 8.8* 8.7*  PROT 7.1 6.3* 6.6  --   --   ALBUMIN 3.9 3.7 3.8 3.5 3.7  AST 74* 54* 35  --   --   ALT 49* 45* 37  --   --   ALKPHOS 126 89 89  --   --   BILITOT 1.4* 1.3* 1.1  --   --   GFRNONAA 36* 55* 44* 41* 43*  ANIONGAP 13 6 5 7 7      Hematology Recent Labs  Lab 04/02/21 1124 04/03/21 0706 04/04/21 0551  WBC 7.1  5.8 5.2  RBC 4.59 4.00* 4.09*  HGB 14.2 12.2* 13.0  HCT 43.0 36.2* 37.5*  MCV 93.7 90.5 91.7  MCH 30.9 30.5 31.8  MCHC 33.0 33.7 34.7  RDW 14.0 13.9 13.8  PLT 294 216 234    Cardiac Enzymes  Recent Labs  Lab 04/02/21 1124 04/02/21 1532 04/03/21 0706 04/03/21 0939  TROPONINIHS 19* 4,508* 3,646* 2,661*      BNP Recent Labs  Lab 04/02/21 1124  BNP 179.6*     Lipids  Lab Results  Component Value Date   CHOL 140 04/04/2021   HDL 43 04/04/2021   LDLCALC 78 04/04/2021   LDLDIRECT UNABLETO PERFORMED DUE TO NORMAL TRIG 04/11/2019   TRIG 94 04/04/2021   CHOLHDL 3.3 04/04/2021    HbA1c  Lab Results  Component Value Date   HGBA1C 6.0 (H) 04/02/2021    Radiology   DG Chest Portable 1 View  Result Date: 04/02/2021 CLINICAL DATA:  86 year old male ventricular tachycardia status post cardioversion. EXAM: PORTABLE CHEST 1 VIEW COMPARISON:  Chest radiograph 09/15/2020 and earlier. FINDINGS: Portable AP view at levin 39  hours. Lower lung volumes. Pacer pads project over the chest. Mediastinal contours are stable and within normal limits. Pulmonary vascular congestion without overt edema. Chronic blunting of the left costophrenic angle appears stable from last year. No pneumothorax or consolidation identified. No acute osseous abnormality identified. Visualized tracheal air column is within normal limits. IMPRESSION: Low lung volumes with pulmonary vascular congestion but no overt edema. Electronically Signed   By: Genevie Ann M.D.   On: 04/02/2021 11:58   Telemetry    RSR, rare PVC - Personally Reviewed  Cardiac Studies   2D Echocardiogram 01.21.2023   1. Left ventricular ejection fraction, by estimation, is 55 to 60%. The  left ventricle has normal function. The left ventricle has no regional  wall motion abnormalities. There is mild left ventricular hypertrophy.  Left ventricular diastolic parameters  are consistent with Grade I diastolic dysfunction (impaired relaxation).   2. Right ventricular systolic function is normal. The right ventricular  size is normal. There is mildly elevated pulmonary artery systolic  pressure. The estimated right ventricular systolic pressure is 99991111 mmHg.   3. Left atrial size was mildly dilated.   4. The mitral valve is normal in structure. Moderate mitral valve  regurgitation. No evidence of mitral stenosis.   5. Tricuspid valve regurgitation is mild to moderate.   6. The aortic valve was not well visualized. Aortic valve regurgitation  is mild. No aortic stenosis is present.   7. The inferior vena cava is normal in size with greater than 50%  respiratory variability, suggesting right atrial pressure of 3 mmHg.  _____________  Cardiac Catheterization  01.23.2023  Left Main  Vessel is angiographically normal.  Left Anterior Descending  Vessel is small.  Mid LAD lesion is 40% stenosed.  First Diagonal Branch  Vessel is large in size. Vessel is angiographically normal.   Left Circumflex  The vessel exhibits minimal luminal irregularities.  Right Coronary Artery  Vessel is large.  Prox RCA lesion is 20% stenosed.  Dist RCA lesion is 20% stenosed.  Right Posterior Descending Artery  Vessel is large in size. Vessel is angiographically normal.  Right Posterior Atrioventricular Artery  The vessel exhibits minimal luminal irregularities.  First Right Posterolateral Branch  Vessel is small in size.  Second Right Posterolateral Branch  Vessel is small in size.  Third Right Posterolateral Branch  Vessel is small in size.  _____________  Patient Profile     86 y.o. male  with a hx of CAD, chronic stable angina, TIA, CKD III, hypertension hyperlipidemia presenting with chest pain, near syncope, tachycardia noted to be in sustained VT in the emergency room with angina & hemodynamic instability requiring cardioversion. EF 55-60%.  Cath 1/24 w/ nonobs dzs.  Assessment & Plan    1.  Sustained VT w/ near syncope:  Presented in VT req DCCV.  No recurrence.  Echo w/ nl LV fxn.  Cath w/ nonobs dzs.  Seen by EP w/ rec for amio 400 bid x 2 wks  400 daily x 2 wks  200 daily.  Pt c/o weakness and nausea this AM.  I am concerned that amio may be contributing to nausea in particular.  Will have to watch closely in outpt setting and may potentially have to reduce dose sooner, if pt doesn't tolerate.  Cont ? blocker.  Will need outpt f/u of TFTs, LFTs, PFTs, and annual eye exam.  He has f/u w/ Dr. Saunders Revel next week and we will arrange for f/u w/ Dr. Caryl Comes (EP) in ~ 4-6 wks.  2.  Demand Ischemia/Nonobstructive CAD:  In setting of #1, HsTrop up to 4508.  Nl EF.  Cath 1/23 w/ dominant RCA and small LAD w/ moderate mid-vessel dzs.  No obs dzs  Med rx w/ plavix (intolerant to asa), ? blocker, nitrate, and statin.  3.  CKD IIIb:  creat stable @ 1.59 this AM.  4.  HTN:  stable.  5.  HL:  LDL 78.  Cont statin.   Signed, Murray Hodgkins, NP  04/06/2021, 10:55 AM    For questions  or updates, please contact   Please consult [email or web address] for contact info under Cardiology/STEMI.

## 2021-04-07 ENCOUNTER — Telehealth: Payer: Self-pay | Admitting: *Deleted

## 2021-04-07 NOTE — Telephone Encounter (Signed)
Transition Care Management Unsuccessful Follow-up Telephone Call  Date of discharge and from where:  [facility]  Attempts:  1st Attempt  Reason for unsuccessful TCM follow-up call:  Left voice message   through spanish interpretor service

## 2021-04-10 ENCOUNTER — Encounter: Payer: Self-pay | Admitting: Internal Medicine

## 2021-04-10 NOTE — Progress Notes (Deleted)
Follow-up Outpatient Visit Date: 04/14/2021  Primary Care Provider: Venita Lick, NP [place] [id number]  Chief Complaint: ***  HPI:  Mr. Aaron is a 86 y.o. male with history of nonobstructive coronary artery disease based on recent catheterization in the setting of sustained ventricular tachycardia, chronic HFpEF, hypertension, hyperlipidemia, GERD, TIA, and chronic kidney disease stage III, who presents for follow-up of ventricular tachycardia and stable angina.  He presented to the [facility] emergency department on 04/02/2021 complaining of sudden onset of chest pain while carrying a box of supplies to his truck.  He was found to be in ventricular tachycardia and underwent stressful cardioversion.  Echocardiogram showed normal LVEF.  Left heart catheterization showed nonobstructive CAD with up to 40% stenosis in the mid LAD.  He was seen by Dr. Caryl Comes (EP) with plans for amiodarone loading and outpatient EP follow-up.  --------------------------------------------------------------------------------------------------  Past Medical History:  Diagnosis Date   (HFpEF) heart failure with preserved ejection fraction ([place])    a.) MPI 12/12/2019 --> EF 42%. b.) TTE 12/17/2019 --> EF 55-60%.   Abdominal aortic atherosclerosis ([place])    a.) MPI 12/12/2019--> very mild.   Anginal pain (H[facility])    Chronic anticoagulation    a.) clopidogrel   CKD (chronic kidney disease), stage III (HCC)    Coronary artery disease    Diastolic dysfunction    a.) TTE 12/17/2019 --> LVEF 55-60%; G2DD.   GERD (gastroesophageal reflux disease)    Gout    History of 2019 novel coronavirus disease (COVID-19) 09/10/2020   Hyperlipidemia    Hypertension    Hypothyroidism    Osteoarthritis of left knee    Psoriasis    Recurrent umbilical hernia    a.) repaired 01/01/2020; recurred.   SNHL (sensory-neural hearing loss), asymmetrical    TIA (transient ischemic attack)    Valvular regurgitation     a.) TTE 12/17/2019 --> EF 55-60%; mild AR, TR   Past Surgical History:  Procedure Laterality Date   BUNIONECTOMY Bilateral    CATARACT EXTRACTION W/ INTRAOCULAR LENS  IMPLANT, BILATERAL Bilateral    LEFT HEART CATH AND CORONARY ANGIOGRAPHY N/A 04/04/2021   Procedure: LEFT HEART CATH AND CORONARY ANGIOGRAPHY;  Surgeon: Wellington Hampshire, MD;  Location: [facility];  Service: Cardiovascular;  Laterality: N/A;   TOTAL KNEE ARTHROPLASTY Left 10/25/2016   Procedure: TOTAL KNEE ARTHROPLASTY;  Surgeon: Earnestine Leys, MD;  Location: [facility];  Service: Orthopedics;  Laterality: Left;   UMBILICAL HERNIA REPAIR N/A 01/01/2020   Procedure: HERNIA REPAIR UMBILICAL ADULT;  Surgeon: Olean Ree, MD;  Location: [facility];  Service: General;  Laterality: N/A;   XI ROBOTIC ASSISTED INGUINAL HERNIA REPAIR WITH MESH Left 01/01/2020   Procedure: XI ROBOTIC ASSISTED INGUINAL HERNIA REPAIR WITH MESH;  Surgeon: Olean Ree, MD;  Location: [facility];  Service: General;  Laterality: Left;    No outpatient medications have been marked as taking for the 04/14/21 encounter (Appointment) with [facility] K[facility], Harrell Gave, MD.    Allergies: Aspirin and Lisinopril  Social History   Tobacco Use   Smoking status: Never   Smokeless tobacco: Never  Vaping Use   Vaping Use: Never used  Substance Use Topics   Alcohol use: No   Drug use: No    Family History  Problem Relation Age of Onset   Cancer Mother        stomach   Heart disease Neg Hx     Review of Systems: A 12-system review of systems was  performed and was negative except as noted in the HPI.  --------------------------------------------------------------------------------------------------  Physical Exam: There were no vitals taken for this visit.  General:  NAD. Neck: No JVD or HJR. Lungs: Clear to auscultation bilaterally without wheezes or crackles. Heart: Regular rate and rhythm without murmurs, rubs, or gallops. Abdomen: Soft, nontender,  nondistended. Extremities: No lower extremity edema.  EKG:  ***  Lab Results  Component Value Date   WBC 5.2 04/04/2021   HGB 13.0 04/04/2021   HCT 37.5 (L) 04/04/2021   MCV 91.7 04/04/2021   PLT 234 04/04/2021    Lab Results  Component Value Date   NA 131 (L) 04/06/2021   K 4.4 04/06/2021   CL 103 04/06/2021   CO2 21 (L) 04/06/2021   BUN 23 04/06/2021   CREATININE 1.59 (H) 04/06/2021   GLUCOSE 89 04/06/2021   ALT 37 04/04/2021    Lab Results  Component Value Date   CHOL 140 04/04/2021   HDL 43 04/04/2021   LDLCALC 78 04/04/2021   LDLDIRECT UNABLETO PERFORMED DUE TO NORMAL TRIG 04/11/2019   TRIG 94 04/04/2021   CHOLHDL 3.3 04/04/2021    --------------------------------------------------------------------------------------------------  ASSESSMENT AND PLAN: Harrell Gave Yakelin Grenier, MD 04/10/2021 12:52 PM

## 2021-04-14 ENCOUNTER — Ambulatory Visit: Payer: Medicaid Other | Admitting: Internal Medicine

## 2021-04-25 NOTE — Progress Notes (Signed)
Cardiology Office Note    Date:  04/27/2021   ID:  Gerald Hodges, DOB December 28, 1935, MRN [id number]  PCP:  Venita Lick, NP  Cardiologist:  Nelva Bush, MD  Electrophysiologist:  None   Chief Complaint: Hospital follow-up  History of Present Illness:   Gerald Hodges is a 86 y.o. male with history of nonobstructive CAD by Franquez in 03/2021, VT in 03/2021, HFpEF, TIA, CKD stage IIIb, HTN, HLD, dizziness with possible vertigo, and GERD who presents for hospital follow up as outlined below.   Prior Myoview in 10/2016 at [facility] was without significant ischemia with a small in size, subtle in severity, fixed mid inferolateral and basal defect consistent with possible artifact but could not rule out subtle scar, EF 56%, no significant coronary artery calcification. Study was low risk and unchanged when compared to prior in 09/2012. Echo from 11/2017 showed an EF of 55-60%, mild LVH, moderate focal basal hypertrophy, Gr1DD, mild aortic valve thickening with mild regurgitation, normal RV size and function. When he was seen in 11/2017, he noted some left leg pain/paresthesias and underwent lower extremity ABIs in 11/2017 that were normal bilaterally.  He was evaluated in 10/2018 at the  [facility] ED with COVID-19 pneumonia.  He has been noted to have stable chronic exertional dyspnea over the years.  Amlodipine has previously been decreased due to positional dizziness and lower extremity swelling.    He was  evaluated for preoperative cardiac risk stratification of hernia repair in 11/2019, with recommendation to proceed with Lexiscan MPI to evaluate for high risk ischemia give symptoms.  Subsequent Lexiscan MPI in 12/2019 showed no significant ischemia with a small to moderate-sized region of predominantly fixed defect in the inferolateral wall that was challenging to interpret in the setting of GI uptake artifact, EF 42%.  CT attenuation corrected images with very mild aortic atherosclerosis and no  notable coronary artery calcification.  Overall, this was a low risk study.  Echo in 12/2019 showed an EF of 55-60%, no RWMA, mild LVH, Gr2DD, normal RV systolic function and ventricular cavity size, mild MR, and mild AI.  Based on these studies, he was felt to be acceptable risk for surgery.    He was seen in 05/2020, noting body aches and fatigue, as well as worsening exertional dyspnea and chest pain.  He had been taking Imdur 30 mg, rather than the previously recommended 90 mg.  He preferred to reinitiate Imdur at 90 mg initially, and reserve LHC for refractory symptoms.  With titration of Imdur, symptoms improved.     He was seen in the office in 09/2020 for repeat cardiac risk stratification for hernia repair.  He was doing well from a cardiac perspective and felt to be moderate risk for noncardiac surgery without further testing indicated.  His surgery was delayed while he was evaluated for positive QuantiFERON gold.   He underwent robotic assisted umbilical hernia repair on 12/14/2020.  At his post surgical follow-up with his surgeon on 12/31/2020, he reported intermittent stabbing sensations along his incisions, a small lump above the umbilicus, burning/discomfort going from the upper abdomen to his chest without frank chest pain, as well as dizziness upon getting up in the morning.  There was no evidence of hernia recurrence.  He was advised to try Pepcid or Prilosec for some of his discomfort.  He was seen in 01/2021 for positional dizziness and vertigo-like symptoms. He was without angina or decompensation. His dizziness was typically worse early in the morning prior to eating  and drinking. Subsequent outpatient cardiac monitoring in 01/2021 showed a predominant rhythm of sinus with an average rate of 75 bpm (range 51 to 123 bpm in sinus), a single episode of NSVT lasting 7 beats with a maximum rate of 171 bpm, 11 atrial runs lasting up to 18 beats with a maximum rate of 136 bpm, no sustained  arrhythmias or prolonged pauses, rare PACs and occasional PVCs with a 3% burden.  Escalation of beta-blocker was deferred given positive orthostatic vital signs at his prior visit.  Subsequent echo in 02/2021 demonstrated an EF of 60 to 65%, no regional wall motion abnormalities, grade 1 diastolic dysfunction, normal RV systolic function and ventricular cavity size, normal PASP, mildly dilated left atrium, mild mitral regurgitation, and an estimated right atrial pressure of 8 mmHg.  He was admitted to [facility] from 1/21 through 1/25 for VT requiring cardioversion.  Initial high-sensitivity troponin of 19 with a delta and peak troponin of 4508.  Upon presentation at magnesium noted to be 1.7 with a potassium of 4.5.  BNP 179.  Echo showed an EF of 55 to 60%, no regional wall motion abnormalities, mild LVH, grade 1 diastolic dysfunction, normal RV systolic function and ventricular cavity size, mildly elevated PASP estimated at 38.4 mmHg, mildly dilated left atrium, moderate mitral regurgitation, mild to moderate tricuspid regurgitation, and an estimated right atrial pressure of 3 mmHg.  LHC on 04/04/2020 demonstrated a superdominant RCA with a relatively small LAD.  The LAD had moderate mid stenosis.  Overall, there was no evidence of obstructive CAD.  There was mildly to moderately elevated LVEDP estimated at 22 mmHg.  There was no evidence of obstructive CAD to explain the patient's VT.  His elevated troponin was felt to be related to supply demand ischemia in the setting of VT requiring cardioversion.  EP was consulted with recommendation for oral amiodarone loading followed by outpatient EP follow-up.  He comes in doing well from a cardiac perspective and is without symptoms of angina or decompensation.  No dizziness, presyncope, or syncope.  No palpitations.  No symptoms consistent with that he was experiencing leading up to his recent hospital admission.  He is tolerating all medications without issues, including  amiodarone from a nausea perspective.  No falls, hematochezia, or melena.  He does note some mild constipation.  No lower extremity swelling, abdominal distention, orthopnea, PND, or early satiety.  He does inform me they had previously purchased a plane ticket to travel to [place] prior to his hospital admission.  Otherwise, he does not have any issues or concerns at this time.  Labs independently reviewed: 03/2021 - magnesium 1.8, potassium 4.4, BUN 23, serum creatinine 1.59, albumin 3.7, TC 140, TG 94, HDL 43, LDL 78, AST/ALT normal, Hgb 13.0, PLT 234, A1c 6.0, TSH 11.193, free T4 normal  Past Medical History:  Diagnosis Date   (HFpEF) heart failure with preserved ejection fraction ([place])    a.) MPI 12/12/2019 --> EF 42%. b.) TTE 12/17/2019 --> EF 55-60%.   Abdominal aortic atherosclerosis ([place])    a.) MPI 12/12/2019--> very mild.   Anginal pain (HCC)    Chronic anticoagulation    a.) clopidogrel   CKD (chronic kidney disease), stage III (HCC)    Coronary artery disease    Nonobstructive CAD with up to 40% mid LAD stenosis by LHC ([id number])   Diastolic dysfunction    a.) TTE 12/17/2019 --> LVEF 55-60%; G2DD.   GERD (gastroesophageal reflux disease)    Gout  History of 2019 novel coronavirus disease (COVID-19) 09/10/2020   Hyperlipidemia    Hypertension    Hypothyroidism    Osteoarthritis of left knee    Psoriasis    Recurrent umbilical hernia    a.) repaired 01/01/2020; recurred.   SNHL (sensory-neural hearing loss), asymmetrical    Sustained ventricular tachycardia 04/02/2021   TIA (transient ischemic attack)    Valvular regurgitation    a.) TTE 12/17/2019 --> EF 55-60%; mild AR, TR    Past Surgical History:  Procedure Laterality Date   BUNIONECTOMY Bilateral    CATARACT EXTRACTION W/ INTRAOCULAR LENS  IMPLANT, BILATERAL Bilateral    LEFT HEART CATH AND CORONARY ANGIOGRAPHY N/A 04/04/2021   Procedure: LEFT HEART CATH AND CORONARY ANGIOGRAPHY;  Surgeon: Wellington Hampshire,  MD;  Location: [facility];  Service: Cardiovascular;  Laterality: N/A;   TOTAL KNEE ARTHROPLASTY Left 10/25/2016   Procedure: TOTAL KNEE ARTHROPLASTY;  Surgeon: Earnestine Leys, MD;  Location: [facility];  Service: Orthopedics;  Laterality: Left;   UMBILICAL HERNIA REPAIR N/A 01/01/2020   Procedure: HERNIA REPAIR UMBILICAL ADULT;  Surgeon: Olean Ree, MD;  Location: [facility];  Service: General;  Laterality: N/A;   XI ROBOTIC ASSISTED INGUINAL HERNIA REPAIR WITH MESH Left 01/01/2020   Procedure: XI ROBOTIC ASSISTED INGUINAL HERNIA REPAIR WITH MESH;  Surgeon: Olean Ree, MD;  Location: [facility];  Service: General;  Laterality: Left;    Current Medications: Current Meds  Medication Sig   acetaminophen (TYLENOL) 500 MG tablet Take 2 tablets (1,000 mg total) by mouth every 6 (six) hours as needed for mild pain.   allopurinol (ZYLOPRIM) 100 MG tablet TAKE 1 TABLET(100 MG) BY MOUTH DAILY   amiodarone (PACERONE) 200 MG tablet Take 2 tablets (400 mg total) by mouth 2 (two) times daily for 12 days, THEN 1 tablet (200 mg total) 2 (two) times daily for 14 days, THEN 1 tablet (200 mg total) daily for 14 days.   carvedilol (COREG) 6.25 MG tablet TAKE 1 TABLET(6.25 MG) BY MOUTH TWICE DAILY WITH A MEAL   clopidogrel (PLAVIX) 75 MG tablet Take 1 tablet (75 mg total) by mouth daily.   furosemide (LASIX) 40 MG tablet Take 1 tablet (40 mg total) by mouth daily as needed for edema (weight gain, shortness of breath).   isosorbide mononitrate (IMDUR) 30 MG 24 hr tablet Take by mouth.   isosorbide mononitrate (IMDUR) 60 MG 24 hr tablet Take 1.5 tablets (90 mg total) by mouth daily.   levothyroxine (SYNTHROID) 75 MCG tablet Take 1 tablet (75 mcg total) by mouth daily.   loratadine (CLARITIN) 10 MG tablet Take 1 tablet (10 mg total) by mouth daily.   losartan (COZAAR) 50 MG tablet Take 1 tablet (50 mg total) by mouth daily.   nitroGLYCERIN (NITROSTAT) 0.4 MG SL tablet Place 1 tablet (0.4 mg total) under  the tongue every 5 (five) minutes as needed for chest pain (chest pain). Maximum of 3 doses.   ondansetron (ZOFRAN) 4 MG tablet Take 1 tablet (4 mg total) by mouth daily as needed for nausea or vomiting.   pantoprazole (PROTONIX) 40 MG tablet Take 1 tablet (40 mg total) by mouth daily.   rosuvastatin (CRESTOR) 10 MG tablet Take 10 MG (one tablet) by mouth 3 days a week (Monday, Wednesday, Friday).   triamcinolone cream (KENALOG) 0.1 % Apply 1 application topically 2 (two) times daily.   VITAMIN D-1000 MAX ST 25 MCG (1000 UT) tablet Take 1,000 Units by mouth daily.    Allergies:  Aspirin and Lisinopril   Social History   Socioeconomic History   Marital status: Married    Spouse name: Not on file   Number of children: Not on file   Years of education: Not on file   Highest education level: Not on file  Occupational History   Not on file  Tobacco Use   Smoking status: Never   Smokeless tobacco: Never  Vaping Use   Vaping Use: Never used  Substance and Sexual Activity   Alcohol use: No   Drug use: No   Sexual activity: Not Currently  Other Topics Concern   Not on file  Social History Narrative   Not on file   Social Determinants of Health   Financial Resource Strain: Not on file  Food Insecurity: Not on file  Transportation Needs: Not on file  Physical Activity: Not on file  Stress: Not on file  Social Connections: Not on file     Family History:  The patient's family history includes Cancer in his mother. There is no history of Heart disease.  ROS:   Review of Systems  Constitutional:  Positive for malaise/fatigue. Negative for chills, diaphoresis, fever and weight loss.  HENT:  Negative for congestion.   Eyes:  Negative for discharge and redness.  Respiratory:  Negative for cough, sputum production, shortness of breath and wheezing.   Cardiovascular:  Negative for chest pain, palpitations, orthopnea, claudication, leg swelling and PND.  Gastrointestinal:  Positive  for constipation. Negative for abdominal pain, blood in stool, heartburn, melena, nausea and vomiting.  Musculoskeletal:  Negative for falls and myalgias.  Skin:  Negative for rash.  Neurological:  Positive for weakness. Negative for dizziness, tingling, tremors, sensory change, speech change, focal weakness and loss of consciousness.  Endo/Heme/Allergies:  Does not bruise/bleed easily.  Psychiatric/Behavioral:  Negative for substance abuse. The patient is not nervous/anxious.   All other systems reviewed and are negative.   EKGs/Labs/Other Studies Reviewed:    Studies reviewed were summarized above. The additional studies were reviewed today:  LHC 04/04/2021:   Prox RCA lesion is 20% stenosed.   Dist RCA lesion is 20% stenosed.   Mid LAD lesion is 40% stenosed.   1.  Superdominant right coronary artery with relatively small LAD.  The LAD has moderate mid stenosis.  Overall, no evidence of obstructive coronary artery disease. 2.  Left ventricular angiography was not performed due to chronic kidney disease.  EF was normal by echo. 3.  Mildly to moderately elevated left ventricular end-diastolic pressure at 22 mmHg.   Recommendations: no obstructive coronary artery disease to explain ventricular tachycardia.  Elevated troponin is likely due to supply demand ischemia in the setting of sustained ventricular tachycardia requiring cardioversion. I consulted EP for management of this patient. __________  2D echo 04/03/2021: 1. Left ventricular ejection fraction, by estimation, is 55 to 60%. The  left ventricle has normal function. The left ventricle has no regional  wall motion abnormalities. There is mild left ventricular hypertrophy.  Left ventricular diastolic parameters  are consistent with Grade I diastolic dysfunction (impaired relaxation).   2. Right ventricular systolic function is normal. The right ventricular  size is normal. There is mildly elevated pulmonary artery systolic   pressure. The estimated right ventricular systolic pressure is 38.4 mmHg.   3. Left atrial size was mildly dilated.   4. The mitral valve is normal in structure. Moderate mitral valve  regurgitation. No evidence of mitral stenosis.   5. Tricuspid valve regurgitation is mild  to moderate.   6. The aortic valve was not well visualized. Aortic valve regurgitation  is mild. No aortic stenosis is present.   7. The inferior vena cava is normal in size with greater than 50%  respiratory variability, suggesting right atrial pressure of 3 mmHg. __________  2D echo 02/15/2021: 1. Left ventricular ejection fraction, by estimation, is 60 to 65%. The  left ventricle has normal function. The left ventricle has no regional  wall motion abnormalities. Left ventricular diastolic parameters are  consistent with Grade I diastolic  dysfunction (impaired relaxation).   2. Right ventricular systolic function is normal. The right ventricular  size is normal. There is normal pulmonary artery systolic pressure. The  estimated right ventricular systolic pressure is 123XX123 mmHg.   3. Left atrial size was mildly dilated.   4. The mitral valve is normal in structure. Mild mitral valve  regurgitation. No evidence of mitral stenosis.   5. The aortic valve was not well visualized. Aortic valve regurgitation  is mild. No aortic stenosis is present.   6. The inferior vena cava is normal in size with <50% respiratory  variability, suggesting right atrial pressure of 8 mmHg. __________  Elwyn Reach patch 01/2021: The patient was monitored for 13 days, 12 hours. The predominant rhythm was sinus with an average rate of 75 bpm (range 51-123 bpm in sinus). There were rare PACs and occasional PVCs (3% burden). A single episode of nonsustained ventricular tachycardia was observed, lasting 7 beats with a maximum rate of 171 bpm. There were 11 atrial runs lasting up to 18 beats, with a maximum rate of 136 bpm. No sustained arrhythmia  or prolonged pause was observed. Patient triggered events corresponded to sinus rhythm and PVCs.   Predominantly sinus rhythm with rare PACs and occasional PVCs, as well as a few runs of NSVT and PSVT. __________  2D echo 12/17/2019: 1. Left ventricular ejection fraction, by estimation, is 55 to 60%. The  left ventricle has normal function. The left ventricle has no regional  wall motion abnormalities. There is mild left ventricular hypertrophy.  Left ventricular diastolic parameters  are consistent with Grade II diastolic dysfunction (pseudonormalization).   2. Right ventricular systolic function is normal. The right ventricular  size is normal.   3. The mitral valve is normal in structure. Mild mitral valve  regurgitation.   4. The aortic valve is tricuspid. Aortic valve regurgitation is mild.   5. The inferior vena cava is dilated in size with >50% respiratory  variability, suggesting right atrial pressure of 8 mmHg. __________   Carlton Adam MPI 12/12/2019: Pharmacological myocardial perfusion imaging study with no significant  Ischemia Small to moderate-sized region of predominantly fixed defect in the inferolateral wall , challenging to interpret in the setting of GI uptake artifact Normal wall motion, EF estimated at 42% (possibly depressed secondary to artifact) No EKG changes concerning for ischemia at peak stress or in recovery. CT attenuation correction images with very mild aortic atherosclerosis, no notable coronary calcification Low risk scan    EKG:  EKG is ordered today.  The EKG ordered today demonstrates sinus bradycardia, 56 bpm, first-degree AV block, no acute ST-T changes  Recent Labs: 04/02/2021: B Natriuretic Peptide 179.6; TSH 11.193 04/04/2021: ALT 37; Hemoglobin 13.0; Platelets 234 04/06/2021: BUN 23; Creatinine, Ser 1.59; Magnesium 1.8; Potassium 4.4; Sodium 131  Recent Lipid Panel    Component Value Date/Time   CHOL 140 04/04/2021 0551   CHOL 106 01/27/2021  1006   TRIG 94 04/04/2021 0551  HDL 43 04/04/2021 0551   HDL 35 (L) 01/27/2021 1006   CHOLHDL 3.3 04/04/2021 0551   VLDL 19 04/04/2021 0551   LDLCALC 78 04/04/2021 0551   LDLCALC 45 01/27/2021 1006   LDLDIRECT UNABLETO PERFORMED DUE TO NORMAL TRIG 04/11/2019 0934    PHYSICAL EXAM:    VS:  BP (!) 160/100 (BP Location: Left Arm, Patient Position: Sitting, Cuff Size: Normal)    Pulse (!) 56    Wt 194 lb (88 kg)    SpO2 93%    BMI 32.79 kg/m   BMI: Body mass index is 32.79 kg/m.  Physical Exam Vitals reviewed.  Constitutional:      Appearance: He is well-developed.  HENT:     Head: Normocephalic and atraumatic.  Eyes:     General:        Right eye: No discharge.        Left eye: No discharge.  Neck:     Vascular: No JVD.  Cardiovascular:     Rate and Rhythm: Normal rate and regular rhythm.     Pulses:          Posterior tibial pulses are 2+ on the right side and 2+ on the left side.     Heart sounds: Normal heart sounds, S1 normal and S2 normal. Heart sounds not distant. No midsystolic click and no opening snap. No murmur heard.   No friction rub.  Pulmonary:     Effort: Pulmonary effort is normal. No respiratory distress.     Breath sounds: Normal breath sounds. No decreased breath sounds, wheezing or rales.  Chest:     Chest wall: No tenderness.  Abdominal:     General: There is no distension.     Palpations: Abdomen is soft.     Tenderness: There is no abdominal tenderness.  Musculoskeletal:     Cervical back: Normal range of motion.     Right lower leg: No edema.     Left lower leg: No edema.  Skin:    General: Skin is warm and dry.     Nails: There is no clubbing.  Neurological:     Mental Status: He is alert and oriented to person, place, and time.  Psychiatric:        Speech: Speech normal.        Behavior: Behavior normal.        Thought Content: Thought content normal.        Judgment: Judgment normal.    Wt Readings from Last 3 Encounters:  04/27/21  194 lb (88 kg)  04/03/21 192 lb 0.3 oz (87.1 kg)  03/30/21 192 lb (87.1 kg)     ASSESSMENT & PLAN:   Nonobstructive CAD: No symptoms concerning for angina.  Recent LHC last month showed no evidence of obstructive CAD.  He remains on clopidogrel, carvedilol, Imdur, losartan, and rosuvastatin.  Post-cath instructions.  No indication for further ischemic testing at this time.  Sustained VT: No further documented evidence or symptoms concerning for sustained arrhythmia.  He remains on amiodarone 200 mg twice daily until 2/21, at which time he will decrease amiodarone to 200 mg daily.  He also remains on carvedilol 6.25 mg twice daily.  Mildly bradycardic heart rate precludes titration of beta-blocker.  Schedule cardiac MRI.  Schedule follow-up with EP for ongoing management and recommendations.  Check CMP, magnesium, and CBC.  We will defer rechecking TSH at this time given it has only been several weeks since this was checked in the  hospital.  He does remain on replacement therapy with levothyroxine.  Encouraged this to be followed up with his PCP.  I do not recommend the patient travel out of the country to [place] given his recent hospital admission for sustained ventricular tachycardia requiring emergent synchronized cardioversion given need for continued testing and close monitoring as outlined above.  I did provide a letter indicating these recommendations for them to provide to the airline.  First-degree AV block: Stable to slightly improved on tracing today.  This will need to continue to be monitored on amiodarone and beta-blocker given the above sustained VT.  HFpEF: He appears euvolemic and well compensated.  He remains on Lasix 40 mg as needed daily.  Check renal function.  HTN: Blood pressure is mildly elevated in the office today, though this has historically been well controlled.  He is anxious.  Continue to monitor and continue current medical therapy as outlined above.  HLD: LDL 78.   He remains on atorvastatin as outlined above.  CKD stage IIIb: Check renal function.  Language barrier: Visit completed today with the assistance of [facility] medical interpreter.   Disposition: F/u with EP after cardiac MRI and Dr. Saunders Revel or an APP in 3 months.   Medication Adjustments/Labs and Tests Ordered: Current medicines are reviewed at length with the patient today.  Concerns regarding medicines are outlined above. Medication changes, Labs and Tests ordered today are summarized above and listed in the Patient Instructions accessible in Encounters.   Signed, Christell Faith, PA-C 04/27/2021 4:37 PM     [address] [place], [place] [address] [phone]

## 2021-04-27 ENCOUNTER — Encounter: Payer: Self-pay | Admitting: Physician Assistant

## 2021-04-27 ENCOUNTER — Other Ambulatory Visit: Payer: Self-pay

## 2021-04-27 ENCOUNTER — Ambulatory Visit (INDEPENDENT_AMBULATORY_CARE_PROVIDER_SITE_OTHER): Payer: PRIVATE HEALTH INSURANCE | Admitting: Physician Assistant

## 2021-04-27 VITALS — BP 160/100 | HR 56 | Wt 194.0 lb

## 2021-04-27 DIAGNOSIS — E785 Hyperlipidemia, unspecified: Secondary | ICD-10-CM

## 2021-04-27 DIAGNOSIS — I472 Ventricular tachycardia, unspecified: Secondary | ICD-10-CM | POA: Diagnosis not present

## 2021-04-27 DIAGNOSIS — I2510 Atherosclerotic heart disease of native coronary artery without angina pectoris: Secondary | ICD-10-CM

## 2021-04-27 DIAGNOSIS — I10 Essential (primary) hypertension: Secondary | ICD-10-CM

## 2021-04-27 DIAGNOSIS — I440 Atrioventricular block, first degree: Secondary | ICD-10-CM | POA: Diagnosis not present

## 2021-04-27 DIAGNOSIS — I5032 Chronic diastolic (congestive) heart failure: Secondary | ICD-10-CM

## 2021-04-27 DIAGNOSIS — Z789 Other specified health status: Secondary | ICD-10-CM

## 2021-04-27 DIAGNOSIS — N1832 Chronic kidney disease, stage 3b: Secondary | ICD-10-CM

## 2021-04-27 NOTE — Patient Instructions (Signed)
Medication Instructions:  Your physician has recommended you make the following change in your medication:   On 05/03/21 REDUCE Amiodarone to 200 mg daily  *If you need a refill on your cardiac medications before your next appointment, please call your pharmacy*   Lab Work: Cmp, Cbc, Mag today If you have labs (blood work) drawn today and your tests are completely normal, you will receive your results only by: [place] (if you have MyChart) OR A paper copy in the mail If you have any lab test that is abnormal or we need to change your treatment, we will call you to review the results.   Testing/Procedures: Your physician has requested that you have a cardiac MRI. Cardiac MRI uses a computer to create images of your heart as its beating, producing both still and moving pictures of your heart and major blood vessels. For further information please visit [email or web address] Please follow the instruction sheet given to you today for more information.    Follow-Up: At [facility], you and your health needs are our priority.  As part of our continuing mission to provide you with exceptional heart care, we have created designated Provider Care Teams.  These Care Teams include your primary Cardiologist (physician) and Advanced Practice Providers (APPs -  Physician Assistants and Nurse Practitioners) who all work together to provide you with the care you need, when you need it.  We recommend signing up for the patient portal called "MyChart".  Sign up information is provided on this After Visit Summary.  MyChart is used to connect with patients for Virtual Visits (Telemedicine).  Patients are able to view lab/test results, encounter notes, upcoming appointments, etc.  Non-urgent messages can be sent to your provider as well.   To learn more about what you can do with MyChart, go to [email or web address].    Your next appointment:   3 month(s)  Follow up with Dr. Caryl Comes after Cardiac  MRI  The format for your next appointment:   In Person  Provider:   You may see Nelva Bush, MD or one of the following Advanced Practice Providers on your designated Care Team:   Murray Hodgkins, NP Christell Faith, PA-C Cadence Kathlen Mody, PA-C}    Other Instructions You are scheduled for Cardiac MRI on TBD Please arrive at the [facility] main entrance of [facility] at TBD (30-45 minutes prior to test start time). ?  [facility]  [address]  [place], [place] [address]  [phone]  Proceed to the [facility] Radiology Department (First Floor).  ?  Magnetic resonance imaging (MRI) is a painless test that produces images of the inside of the body without using X-rays. During an MRI, strong magnets and radio waves work together in a Research officer, political party to form detailed images. MRI images may provide more details about a medical condition than X-rays, CT scans, and ultrasounds can provide.  You may be given earphones to listen for instructions.  You may eat a light breakfast and take medications as ordered with the exception of Lasix. If a contrast material will be used, an IV will be inserted into one of your veins. Contrast material will be injected into your IV.  You will be asked to remove all metal, including: Watch, jewelry, and other metal objects including hearing aids, hair pieces and dentures. (Braces and fillings normally are not a problem.)  If contrast material was used:  It will leave your body through your urine within a day. You  may be told to drink plenty of fluids to help flush the contrast material out of your system.  TEST WILL TAKE APPROXIMATELY 1 HOUR  PLEASE NOTIFY SCHEDULING AT LEAST 24 HOURS IN ADVANCE IF YOU ARE UNABLE TO KEEP YOUR APPOINTMENT.

## 2021-04-28 ENCOUNTER — Telehealth: Payer: Self-pay | Admitting: *Deleted

## 2021-04-28 ENCOUNTER — Encounter: Payer: Self-pay | Admitting: *Deleted

## 2021-04-28 LAB — CBC WITH DIFFERENTIAL/PLATELET
Basophils Absolute: 0 10*3/uL (ref 0.0–0.2)
Basos: 1 %
EOS (ABSOLUTE): 0.2 10*3/uL (ref 0.0–0.4)
Eos: 4 %
Hematocrit: 42.5 % (ref 37.5–51.0)
Hemoglobin: 14.9 g/dL (ref 13.0–17.7)
Immature Grans (Abs): 0 10*3/uL (ref 0.0–0.1)
Immature Granulocytes: 0 %
Lymphocytes Absolute: 1.5 10*3/uL (ref 0.7–3.1)
Lymphs: 36 %
MCH: 31.8 pg (ref 26.6–33.0)
MCHC: 35.1 g/dL (ref 31.5–35.7)
MCV: 91 fL (ref 79–97)
Monocytes Absolute: 0.5 10*3/uL (ref 0.1–0.9)
Monocytes: 12 %
Neutrophils Absolute: 2 10*3/uL (ref 1.4–7.0)
Neutrophils: 47 %
Platelets: 281 10*3/uL (ref 150–450)
RBC: 4.68 x10E6/uL (ref 4.14–5.80)
RDW: 13.3 % (ref 11.6–15.4)
WBC: 4.2 10*3/uL (ref 3.4–10.8)

## 2021-04-28 LAB — COMPREHENSIVE METABOLIC PANEL
ALT: 18 IU/L (ref 0–44)
AST: 25 IU/L (ref 0–40)
Albumin/Globulin Ratio: 1.8 (ref 1.2–2.2)
Albumin: 4.7 g/dL — ABNORMAL HIGH (ref 3.6–4.6)
Alkaline Phosphatase: 91 IU/L (ref 44–121)
BUN/Creatinine Ratio: 16 (ref 10–24)
BUN: 26 mg/dL (ref 8–27)
Bilirubin Total: 1.1 mg/dL (ref 0.0–1.2)
CO2: 26 mmol/L (ref 20–29)
Calcium: 9.8 mg/dL (ref 8.6–10.2)
Chloride: 91 mmol/L — ABNORMAL LOW (ref 96–106)
Creatinine, Ser: 1.67 mg/dL — ABNORMAL HIGH (ref 0.76–1.27)
Globulin, Total: 2.6 g/dL (ref 1.5–4.5)
Glucose: 103 mg/dL — ABNORMAL HIGH (ref 70–99)
Potassium: 4.8 mmol/L (ref 3.5–5.2)
Sodium: 129 mmol/L — ABNORMAL LOW (ref 134–144)
Total Protein: 7.3 g/dL (ref 6.0–8.5)
eGFR: 40 mL/min/{1.73_m2} — ABNORMAL LOW (ref >59)

## 2021-04-28 LAB — MAGNESIUM: Magnesium: 1.8 mg/dL (ref 1.6–2.3)

## 2021-04-28 NOTE — Telephone Encounter (Signed)
Using [facility] interpreter services [place] [phone]. Left voicemail message for patient to call back for review of results and recommendations.

## 2021-04-28 NOTE — Telephone Encounter (Signed)
-----   Message from Rise Mu, PA-C sent at 04/28/2021  7:53 AM EST ----- Random glucose is ok Renal function is abnormal, though stable and consistent with his prior readings Sodium is low, please restrict water Potassium is at goal Liver function normal Magnesium normal, though slightly below goal, please start magnesium oxide 400 mg daily Blood count normal

## 2021-04-28 NOTE — Telephone Encounter (Signed)
Phone call to [phone].  Pt's son answered phone, at work right now and can't talk. Parent uses this phone number but also has separate number but does not have it with him at this time. Son states that he gets up at around 9:30 AM and it would be fine to call him back to speak to pt or get another contact number for parent.

## 2021-04-28 NOTE — Telephone Encounter (Signed)
-----   Message from Leigh Aurora, MD sent at 04/27/2021  4:51 PM EST ----- Regarding: FW: Offer LTBI Yes, he had EPI 09/17/2020  ----- Message ----- From: Leigh Aurora, MD Sent: 04/27/2021   4:42 PM EST To: Doy Mince, RN Subject: Offer LTBI                                     This is the husband to the other patient, spanish speaking. Once she has been interviewed and they understand what it's all about, need to offer him LTBI if interested.  As I mentioned in previous message, we have previous QFT results and now 2 negative CXRs for him, and I believe his EPI is done too. I'll double check on that and let you know if I am wrong.   Thanks, [employer]

## 2021-04-29 ENCOUNTER — Telehealth: Payer: Self-pay | Admitting: Physician Assistant

## 2021-04-29 MED ORDER — MAGNESIUM OXIDE 400 MG PO CAPS: 400.0000 mg | ORAL_CAPSULE | Freq: Every day | ORAL | 3 refills | Status: AC

## 2021-04-29 NOTE — Telephone Encounter (Addendum)
Phone call to pt with interpreter Marlene Yemen.  Pt is hard of hearing.  Pt's wife helped with conversation. Pt is not interested in LTBI.

## 2021-04-29 NOTE — Telephone Encounter (Signed)
Please call to discuss medication change.

## 2021-04-29 NOTE — Telephone Encounter (Signed)
Spoke with Dakar NP at [facility] who is patients primary care provider. She reports family stated that he was started on two medications. Advised that I do not see anything in his chart other than Meclizine as needed and now Magnesium Oxide 400 mg once daily. Dakar reports having Epic access and she too reviewed chart and did not see anything. She states patient is illiterate and family also has difficulty with understanding. Discussed that we have results also available but have been not been successful reaching anyone. As his PCP I reviewed results and recommendations with her. She requested that we please enter her contact information for future calls regarding patients care.   Dakar NP number is [phone]. Will add this to his contact list. She verbalized understanding of our conversation with no further questions at this time.     Sondra Barges, PA-C  Sent: Thu April 28, 2021  7:53 AM   Result Note  Random glucose is ok  Renal function is abnormal, though stable and consistent with his prior readings  Sodium is low, please restrict water  Potassium is at goal  Liver function normal  Magnesium normal, though slightly below goal, please start magnesium oxide 400 mg daily  Blood count normal

## 2021-06-01 ENCOUNTER — Ambulatory Visit (HOSPITAL_COMMUNITY)
Admission: RE | Admit: 2021-06-01 | Discharge: 2021-06-01 | Disposition: A | Discharge status: Home / Self Care | Payer: Medicaid Other | Source: Ambulatory Visit | Attending: Physician Assistant | Admitting: Physician Assistant

## 2021-06-01 ENCOUNTER — Other Ambulatory Visit: Payer: Self-pay

## 2021-06-01 DIAGNOSIS — I472 Ventricular tachycardia, unspecified: Secondary | ICD-10-CM | POA: Insufficient documentation

## 2021-06-01 MED ORDER — GADOBUTROL 1 MMOL/ML IV SOLN
10.0000 mL | Freq: Once | INTRAVENOUS | Status: AC | PRN
  Administered 2021-06-01: 10 mL via INTRAVENOUS

## 2021-06-03 ENCOUNTER — Telehealth: Payer: Self-pay | Admitting: *Deleted

## 2021-06-03 NOTE — Telephone Encounter (Signed)
No answer/Voicemail box has not been set up.  

## 2021-06-03 NOTE — Telephone Encounter (Signed)
-----   Message from Sondra Barges, PA-C sent at 06/02/2021  5:08 PM EDT ----- ?Please inform the patient his cardiac MRI showed a mildly reduced pump function with evidence suggestive of prior MI in the left circumflex territory with likely nonviable myocardium.  However, there was no definite explanatory lesion found in this territory during recent LHC.  There is also at least moderate leakiness of the mitral valve which may be due to infarct related mitral regurgitation.  It is felt that the patient's VT likely originated from an area of scarring in the LCx territory.  Continue current medical therapy including carvedilol and amiodarone.  Be sure he keeps his follow-up with EP. ?

## 2021-06-08 NOTE — Telephone Encounter (Signed)
Left voicemail message on son's number per release form to call back for review of results.  ?

## 2021-06-08 NOTE — Telephone Encounter (Signed)
Patient son returning call  ?son says it will be best to call with interpreter for him as well ?

## 2021-06-08 NOTE — Telephone Encounter (Signed)
No answer/No voicemail box set up.  

## 2021-06-08 NOTE — Telephone Encounter (Signed)
Called and spoke with patients son per release form using [employer] [place] ID# [phone]. Reviewed results and recommendations with his son. Confirmed upcoming appointment on 07/28/21 at 09:00 am and to continue medication. He verbalized understanding of our conversation with no further questions at this time.  ?

## 2021-07-27 ENCOUNTER — Ambulatory Visit: Payer: Medicaid Other | Admitting: Nurse Practitioner

## 2021-07-28 ENCOUNTER — Ambulatory Visit: Payer: Medicaid Other | Admitting: Internal Medicine

## 2021-08-11 ENCOUNTER — Other Ambulatory Visit
Admission: RE | Admit: 2021-08-11 | Discharge: 2021-08-11 | Disposition: A | Discharge status: Home / Self Care | Payer: Medicaid Other | Attending: Internal Medicine | Admitting: Internal Medicine

## 2021-08-11 ENCOUNTER — Encounter: Payer: Self-pay | Admitting: Internal Medicine

## 2021-08-11 ENCOUNTER — Ambulatory Visit (INDEPENDENT_AMBULATORY_CARE_PROVIDER_SITE_OTHER): Payer: PRIVATE HEALTH INSURANCE | Admitting: Internal Medicine

## 2021-08-11 VITALS — BP 160/90 | HR 53 | Ht 65.0 in | Wt 188.0 lb

## 2021-08-11 DIAGNOSIS — Z79899 Other long term (current) drug therapy: Secondary | ICD-10-CM | POA: Insufficient documentation

## 2021-08-11 DIAGNOSIS — I440 Atrioventricular block, first degree: Secondary | ICD-10-CM

## 2021-08-11 DIAGNOSIS — I10 Essential (primary) hypertension: Secondary | ICD-10-CM

## 2021-08-11 DIAGNOSIS — I472 Ventricular tachycardia, unspecified: Secondary | ICD-10-CM

## 2021-08-11 DIAGNOSIS — I5032 Chronic diastolic (congestive) heart failure: Secondary | ICD-10-CM | POA: Diagnosis not present

## 2021-08-11 LAB — HEPATIC FUNCTION PANEL
ALT: 27 U/L (ref 0–44)
AST: 34 U/L (ref 15–41)
Albumin: 4.6 g/dL (ref 3.5–5.0)
Alkaline Phosphatase: 71 U/L (ref 38–126)
Bilirubin, Direct: 0.2 mg/dL (ref 0.0–0.2)
Indirect Bilirubin: 1.2 mg/dL — ABNORMAL HIGH (ref 0.3–0.9)
Total Bilirubin: 1.4 mg/dL — ABNORMAL HIGH (ref 0.3–1.2)
Total Protein: 8 g/dL (ref 6.5–8.1)

## 2021-08-11 LAB — TSH: TSH: 11.448 u[IU]/mL — ABNORMAL HIGH (ref 0.350–4.500)

## 2021-08-11 MED ORDER — FUROSEMIDE 40 MG PO TABS: ORAL_TABLET | ORAL | 1 refills | Status: DC

## 2021-08-11 MED ORDER — ISOSORBIDE MONONITRATE ER 120 MG PO TB24: ORAL_TABLET | ORAL | 1 refills | Status: AC

## 2021-08-11 MED ORDER — LOSARTAN POTASSIUM 100 MG PO TABS: 100.0000 mg | ORAL_TABLET | Freq: Every day | ORAL | 1 refills | Status: DC

## 2021-08-11 NOTE — Patient Instructions (Signed)
Medication Instructions:  - Your physician has recommended you make the following change in your medication:   1) CHANGE Lasix (furosemide) 40 mg: - take 2 tablets (80 mg) by mouth once daily x 3 days,  - then take 1 tablet (40 mg) by mouth once daily  2) INCREASE Imdur (isosorbide mononitrate) to 120 mg: - take 1 tablet (120 mg) by mouth once daily AT BEDTIME  3) INCREASE Cozaar (losartan) to 100 mg: - take 1 tablet by mouth once daily    *If you need a refill on your cardiac medications before your next appointment, please call your pharmacy*   Lab Work: - Your physician recommends that you have lab work today:   Heritage manager at [facility] 1st desk on the right to check in (REGISTRATION)  Lab hours: Monday- Friday (7:30 am- 5:30 pm)   If you have labs (blood work) drawn today and your tests are completely normal, you will receive your results only by: MyChart Message (if you have MyChart) OR A paper copy in the mail If you have any lab test that is abnormal or we need to change your treatment, we will call you to review the results.   Testing/Procedures: - none ordered   Follow-Up: At [facility], you and your health needs are our priority.  As part of our continuing mission to provide you with exceptional heart care, we have created designated Provider Care Teams.  These Care Teams include your primary Cardiologist (physician) and Advanced Practice Providers (APPs -  Physician Assistants and Nurse Practitioners) who all work together to provide you with the care you need, when you need it.  We recommend signing up for the patient portal called "MyChart".  Sign up information is provided on this After Visit Summary.  MyChart is used to connect with patients for Virtual Visits (Telemedicine).  Patients are able to view lab/test results, encounter notes, upcoming appointments, etc.  Non-urgent messages can be sent to your provider as well.   To learn more  about what you can do with MyChart, go to [email or web address].    Your next appointment:   1) 3 months with Dr. Okey Dupre (only)   2) 6 months with Dr. Graciela Husbands  The format for your next appointment:   In Person  Provider:   As above     Other Instructions N/a  Important Information About Sugar

## 2021-08-11 NOTE — Progress Notes (Signed)
Patient Care Team: Dionicia Abler, NP as PCP - General End, Harrell Gave, MD as PCP - Cardiology (Cardiology) Odis Luster, MD as Referring Physician (Cardiology)   HPI  Gerald Hodges is a 86 y.o. male seen in follow-up for ventricular tachycardia, presenting 1/23 surprisingly well hemodynamically tolerated not withstanding the rate of 230, in the setting of normal left ventricular function which we elected to treat with amiodarone  No interval palpitations of which she is aware.  Tolerating amiodarone without nausea cough.  Complains of fatigue sluggishness and dyspnea on exertion.  Has nocturnal dyspnea.  Two-pillow orthopnea. Some volume overload.  Diet is replete of sodium and fluid     DATE TEST EF    8/18 Myoview (CE)  56% Fixed lateral defect  10/21 Echo   55-65 %    1//23 Echo  55-60 %  MR mod  1/23 LHC   Non obstructive CAD  3/23 cMRI 46% LGE cw/ prior LCx MI MR>=Mod    Date Cr K Hgb  1/23 1.3 4.4 14.2    Date Cr K Hgb TSH LFTs  1/23 1.3 4.4 14.2 11.2    2/23 1.67 4.8    18     Records and Results Reviewed   Past Medical History:  Diagnosis Date   (HFpEF) heart failure with preserved ejection fraction ([place])    a.) MPI 12/12/2019 --> EF 42%. b.) TTE 12/17/2019 --> EF 55-60%.   Abdominal aortic atherosclerosis ([place])    a.) MPI 12/12/2019--> very mild.   Anginal pain (HCC)    Chronic anticoagulation    a.) clopidogrel   CKD (chronic kidney disease), stage III (HCC)    Coronary artery disease    Nonobstructive CAD with up to 40% mid LAD stenosis by LHC ([id number])   Diastolic dysfunction    a.) TTE 12/17/2019 --> LVEF 55-60%; G2DD.   GERD (gastroesophageal reflux disease)    Gout    History of 2019 novel coronavirus disease (COVID-19) 09/10/2020   Hyperlipidemia    Hypertension    Hypothyroidism    Osteoarthritis of left knee    Psoriasis    Recurrent umbilical hernia    a.) repaired 01/01/2020; recurred.   SNHL (sensory-neural  hearing loss), asymmetrical    Sustained ventricular tachycardia ([place]) 04/02/2021   TIA (transient ischemic attack)    Valvular regurgitation    a.) TTE 12/17/2019 --> EF 55-60%; mild AR, TR    Past Surgical History:  Procedure Laterality Date   BUNIONECTOMY Bilateral    CATARACT EXTRACTION W/ INTRAOCULAR LENS  IMPLANT, BILATERAL Bilateral    LEFT HEART CATH AND CORONARY ANGIOGRAPHY N/A 04/04/2021   Procedure: LEFT HEART CATH AND CORONARY ANGIOGRAPHY;  Surgeon: Wellington Hampshire, MD;  Location: [place] [facility];  Service: Cardiovascular;  Laterality: N/A;   TOTAL KNEE ARTHROPLASTY Left 10/25/2016   Procedure: TOTAL KNEE ARTHROPLASTY;  Surgeon: Earnestine Leys, MD;  Location: [facility] OR[facility];  Service: Orthopedics;  Laterality: Left;   UMBILICAL HERNIA REPAIR N/A 01/01/2020   Procedure: HERNIA REPAIR UMBILICAL ADULT;  Surgeon: Olean Ree, MD;  Location: [facility] OR[facility];  Service: General;  Laterality: N/A;   XI ROBOTIC ASSISTED INGUINAL HERNIA REPAIR WITH MESH Left 01/01/2020   Procedure: XI ROBOTIC ASSISTED INGUINAL HERNIA REPAIR WITH MESH;  Surgeon: Olean Ree, MD;  Location: [facility] OR[facility];  Service: General;  Laterality: Left;    Current Meds  Medication Sig   acetaminophen (TYLENOL) 500 MG tablet Take 2 tablets (1,000 mg total) by mouth every 6 (  six) hours as needed for mild pain.   allopurinol (ZYLOPRIM) 100 MG tablet TAKE 1 TABLET(100 MG) BY MOUTH DAILY   amiodarone (PACERONE) 200 MG tablet Take 200 mg by mouth daily.   calcium carbonate (TUMS - DOSED IN MG ELEMENTAL CALCIUM) 500 MG chewable tablet Chew 1 tablet by mouth 2 (two) times daily as needed for indigestion or heartburn.   carvedilol (COREG) 6.25 MG tablet TAKE 1 TABLET(6.25 MG) BY MOUTH TWICE DAILY WITH A MEAL   clopidogrel (PLAVIX) 75 MG tablet Take 1 tablet (75 mg total) by mouth daily.   docusate sodium (COLACE) 100 MG capsule Take 100 mg by mouth daily.   isosorbide mononitrate (IMDUR) 120 MG 24 hr tablet Take 1 tablet (120 mg)  by mouth once daily AT BEDTIME   levothyroxine (SYNTHROID) 75 MCG tablet Take 1 tablet (75 mcg total) by mouth daily.   loratadine (CLARITIN) 10 MG tablet Take 1 tablet (10 mg total) by mouth daily.   losartan (COZAAR) 100 MG tablet Take 1 tablet (100 mg total) by mouth daily.   Magnesium Oxide 400 MG CAPS Take 1 capsule (400 mg total) by mouth daily.   nitroGLYCERIN (NITROSTAT) 0.4 MG SL tablet Place 1 tablet (0.4 mg total) under the tongue every 5 (five) minutes as needed for chest pain (chest pain). Maximum of 3 doses.   pantoprazole (PROTONIX) 40 MG tablet Take 1 tablet (40 mg total) by mouth daily.   polyethylene glycol (MIRALAX / GLYCOLAX) 17 g packet Take 17 g by mouth daily as needed.   rosuvastatin (CRESTOR) 10 MG tablet Take 10 MG (one tablet) by mouth 3 days a week (Monday, Wednesday, Friday).   traZODone (DESYREL) 50 MG tablet Take 50 mg by mouth at bedtime.   VITAMIN D-1000 MAX ST 25 MCG (1000 UT) tablet Take 1,000 Units by mouth daily.   [DISCONTINUED] furosemide (LASIX) 40 MG tablet Take 1 tablet (40 mg total) by mouth daily as needed for edema (weight gain, shortness of breath).   [DISCONTINUED] isosorbide mononitrate (IMDUR) 30 MG 24 hr tablet Take 90 mg by mouth daily.   [DISCONTINUED] losartan (COZAAR) 50 MG tablet Take 1 tablet (50 mg total) by mouth daily.    Allergies  Allergen Reactions   Aspirin Cough   Lisinopril Itching    Throat itching       Review of Systems negative except from HPI and PMH  Physical Exam BP (!) 160/90 (BP Location: Left Arm, Patient Position: Sitting, Cuff Size: Large)   Pulse (!) 53   Ht 5\' 5"  (1.651 m)   Wt 188 lb (85.3 kg)   SpO2 97%   BMI 31.28 kg/m  Well developed and well nourished in no acute distress HENT normal E scleral and icterus clear Neck Supple JVP 10 carotids brisk and full Clear to ausculation Regular rate and rhythm, no murmurs gallops or rub Soft with active bowel sounds No clubbing cyanosis 2+ Edema Alert  and oriented, grossly normal motor and sensory function Skin Warm and Dry  ECG sinus at 63 Interval 23/08/46  CrCl cannot be calculated (Patient's most recent lab result is older than the maximum 21 days allowed.).   Assessment and  Plan  Ventricular tachycardia-monomorphic right bundle superior axis   First-degree AV block   Nonobstructive coronary artery disease--cMRI suggestive of prior lateral wall MI   Renal insufficiency grade 3B Estimated Creatinine Clearance: 34.3 mL/min (A) (by C-G formula based on SCr of 1.64 mg/dL (H)).   Hypertension   Hypothyroidism  Congestive heart failure-systolic/diastolic/acute/chronic class III  High risk medication surveillance-amiodarone  Mitral regurgitation at least moderate  Cardiomyopathy-nonischemic ejection fraction 45% (in the context of at least moderate MR)  Sinus bradycardia  Heart failure with exuberant salt and water intake.  Discussed the importance of salt and water restriction.  We will increase his furosemide from 40 as needed to 80 daily x3 days and then 40 every day.  No intercurrent tachypalpitations.  No intercurrent ventricular tachycardia of which she is aware.  We will continue the amiodarone 200 mg a day.  He was hypothyroid at initiation and I do not see blood work follow-up.  We will check his TSH today and other amiodarone surveillance laboratories.  Reviewed the MRI scan.  We will have him follow-up with Dr. Saunders Revel.  He has significant left ventricular dysfunction in the context of his MR.  With his elevated blood pressure, we will increase his losartan from 50--100.  Bradycardia precludes further up titration of his beta-blocker.  It would be appropriate to consider spironolactone SGLT2 as well as changing from losartan--Entresto.  We will make 1 change at a time.  In addition, with his nocturnal dyspnea, we will change his isosorbide to nighttime and increase the dose from 90--120  For now we will continue his  carvedilol at 6.25 not withstanding the bradycardia.  I suspect we will need to down titrated     Current medicines are reviewed at length with the patient today .  The patient does not have concerns regarding medicines.

## 2021-08-18 ENCOUNTER — Telehealth: Payer: Self-pay | Admitting: Internal Medicine

## 2021-08-18 MED ORDER — LEVOTHYROXINE SODIUM 100 MCG PO TABS: 100.0000 ug | ORAL_TABLET | Freq: Every day | ORAL | 1 refills | Status: DC

## 2021-08-18 NOTE — Telephone Encounter (Signed)
Attempted to call the patient at his primary contact [employer] # [phone]. The patient's wife answered the call and advised the patient does not hear well.  I advise we do not have it on file that we can speak with anyone but Gerald Hodges (pt's son) regarding his care.  Inquired if we could contact Alexander and if I would need an interpreter when calling. Per the patient's wife, Gerald Hodges speaks [place]- no interpreter should be needed.   I will attempt to call Gerald Hodges with the patient's results.

## 2021-08-18 NOTE — Telephone Encounter (Signed)
I spoke with the patient's son, Lyn Hollingshead (ok per [employer] [facility]), regarding the patient's lab results. He is advised of stable mild changes in the liver. Lyn Hollingshead also advised that the patient's TSH is elevated, as it previously was in January, prior to the initiation of amiodarone.  I advised Alexander of Dr. Odessa Fleming recommendations to: 1) INCREASE synthroid to 100 mcg once daily 2) F/U with his PCP for further management of his thyroid issues.  Alexander voices understanding and is agreeable. He advised that the patient's medications are in pill packs from the [facility] pharmacy. I advised I will update the [facility] of the change in synthroid dosing.  Alexander then advised that the patient is having quite a bit of, what sounds like acid reflux. He states the patient takes 17 pills at one time. I advised Lyn Hollingshead that his pill packs may need to be done to divide up some of the tablets he is taking to see if this will help with his symptoms.  Lyn Hollingshead is aware I will send this message to the patient's PCP, Clayborne Dana, NP at [facility] and advise her of the above change in the patient's synthroid dosing and ask that she have her clinic reach out as to when to follow up on his TSH. Will also advise on reflux symptoms as well.  Alexander again voices understanding of all of the above and was appreciative of the call.

## 2021-08-18 NOTE — Telephone Encounter (Signed)
Clarified orders with Dr. Caryl Comes regarding the patient's abnormal TSH. Per Dr. Caryl Comes: 1) INCREASE synthroid to 100 mcg once daily 2) F/U with his PCP for further management.

## 2021-08-18 NOTE — Telephone Encounter (Signed)
Duke Salvia, MD  08/12/2021  9:07 AM EDT      Please inform patient that drug surveillance labs are abnormal with elevated TSH and indirect bilirubin, seen previously and suspect [place]

## 2021-09-27 ENCOUNTER — Ambulatory Visit: Payer: Medicaid Other | Admitting: Nurse Practitioner

## 2021-11-01 ENCOUNTER — Other Ambulatory Visit: Payer: Self-pay

## 2021-11-01 ENCOUNTER — Emergency Department
Admission: EM | Admit: 2021-11-01 | Discharge: 2021-11-01 | Disposition: A | Discharge status: Home / Self Care | Payer: Medicaid Other | Attending: Emergency Medicine | Admitting: Emergency Medicine

## 2021-11-01 ENCOUNTER — Encounter: Payer: Self-pay | Admitting: Emergency Medicine

## 2021-11-01 ENCOUNTER — Emergency Department: Payer: Medicaid Other

## 2021-11-01 DIAGNOSIS — I130 Hypertensive heart and chronic kidney disease with heart failure and stage 1 through stage 4 chronic kidney disease, or unspecified chronic kidney disease: Secondary | ICD-10-CM | POA: Insufficient documentation

## 2021-11-01 DIAGNOSIS — I2510 Atherosclerotic heart disease of native coronary artery without angina pectoris: Secondary | ICD-10-CM | POA: Diagnosis not present

## 2021-11-01 DIAGNOSIS — N189 Chronic kidney disease, unspecified: Secondary | ICD-10-CM | POA: Diagnosis not present

## 2021-11-01 DIAGNOSIS — I509 Heart failure, unspecified: Secondary | ICD-10-CM | POA: Diagnosis not present

## 2021-11-01 DIAGNOSIS — R052 Subacute cough: Secondary | ICD-10-CM | POA: Diagnosis not present

## 2021-11-01 DIAGNOSIS — I10 Essential (primary) hypertension: Secondary | ICD-10-CM

## 2021-11-01 LAB — CBC WITH DIFFERENTIAL/PLATELET
Abs Immature Granulocytes: 0.02 10*3/uL (ref 0.00–0.07)
Basophils Absolute: 0 10*3/uL (ref 0.0–0.1)
Basophils Relative: 1 %
Eosinophils Absolute: 0.2 10*3/uL (ref 0.0–0.5)
Eosinophils Relative: 5 %
HCT: 43.2 % (ref 39.0–52.0)
Hemoglobin: 14.9 g/dL (ref 13.0–17.0)
Immature Granulocytes: 0 %
Lymphocytes Relative: 32 %
Lymphs Abs: 1.5 10*3/uL (ref 0.7–4.0)
MCH: 31.2 pg (ref 26.0–34.0)
MCHC: 34.5 g/dL (ref 30.0–36.0)
MCV: 90.4 fL (ref 80.0–100.0)
Monocytes Absolute: 0.5 10*3/uL (ref 0.1–1.0)
Monocytes Relative: 10 %
Neutro Abs: 2.5 10*3/uL (ref 1.7–7.7)
Neutrophils Relative %: 52 %
Platelets: 272 10*3/uL (ref 150–400)
RBC: 4.78 MIL/uL (ref 4.22–5.81)
RDW: 12.6 % (ref 11.5–15.5)
WBC: 4.7 10*3/uL (ref 4.0–10.5)
nRBC: 0 % (ref 0.0–0.2)

## 2021-11-01 LAB — COMPREHENSIVE METABOLIC PANEL
ALT: 29 U/L (ref 0–44)
AST: 31 U/L (ref 15–41)
Albumin: 4.2 g/dL (ref 3.5–5.0)
Alkaline Phosphatase: 80 U/L (ref 38–126)
Anion gap: 8 (ref 5–15)
BUN: 12 mg/dL (ref 8–23)
CO2: 26 mmol/L (ref 22–32)
Calcium: 9.2 mg/dL (ref 8.9–10.3)
Chloride: 96 mmol/L — ABNORMAL LOW (ref 98–111)
Creatinine, Ser: 1.19 mg/dL (ref 0.61–1.24)
GFR, Estimated: >60 mL/min (ref >60)
Glucose, Bld: 109 mg/dL — ABNORMAL HIGH (ref 70–99)
Potassium: 4 mmol/L (ref 3.5–5.1)
Sodium: 130 mmol/L — ABNORMAL LOW (ref 135–145)
Total Bilirubin: 1.4 mg/dL — ABNORMAL HIGH (ref 0.3–1.2)
Total Protein: 7.5 g/dL (ref 6.5–8.1)

## 2021-11-01 LAB — TROPONIN I (HIGH SENSITIVITY): Troponin I (High Sensitivity): 13 ng/L (ref <18)

## 2021-11-01 LAB — BRAIN NATRIURETIC PEPTIDE: B Natriuretic Peptide: 89.7 pg/mL (ref 0.0–100.0)

## 2021-11-01 MED ORDER — SPIRONOLACTONE 25 MG PO TABS: 25.0000 mg | ORAL_TABLET | Freq: Every day | ORAL | 0 refills | Status: DC

## 2021-11-01 MED ORDER — IOHEXOL 350 MG/ML SOLN
75.0000 mL | Freq: Once | INTRAVENOUS | Status: AC | PRN
  Administered 2021-11-01: 75 mL via INTRAVENOUS

## 2021-11-01 MED ORDER — ALBUTEROL SULFATE HFA 108 (90 BASE) MCG/ACT IN AERS: 2.0000 | INHALATION_SPRAY | Freq: Four times a day (QID) | RESPIRATORY_TRACT | 0 refills | Status: DC | PRN

## 2021-11-01 MED ORDER — SPIRONOLACTONE 25 MG PO TABS
12.5000 mg | ORAL_TABLET | Freq: Once | ORAL | Status: AC
  Administered 2021-11-01: 12.5 mg via ORAL
  Filled 2021-11-01: qty 0.5

## 2021-11-01 MED ORDER — HYDRALAZINE HCL 20 MG/ML IJ SOLN
10.0000 mg | Freq: Once | INTRAMUSCULAR | Status: AC
  Administered 2021-11-01: 10 mg via INTRAVENOUS
  Filled 2021-11-01: qty 1

## 2021-11-01 NOTE — ED Notes (Signed)
Pt requests to speak to MD one more time prior to departure. Completed.  While removing IV, experienced small skin tear from tape. Cleansed and dressed and family instructed to continue wound care to this.    Patient verbalizes understanding of discharge instructions. Opportunity for questioning and answers were provided. Armband removed by staff, pt discharged from ED to home via POV.

## 2021-11-01 NOTE — ED Notes (Signed)
EDP at bedside  

## 2021-11-01 NOTE — ED Triage Notes (Signed)
Clonidine given at 1420 at [facility], no improvement of BP.    Arrives for evaluation of HTN.

## 2021-11-01 NOTE — ED Triage Notes (Addendum)
Patient to ED via POV sent from [facility] for high blood pressure. Patient received clonidine at 1420 from [place] PTA to ED. Patient unsure of what BP meds he takes at home but states he has been taking whatever he has everyday. Patient's wife concerned that patient isn't sleeping at night due to cough- had cough for approx 2 weeks. Stating having throat pain when coughing and swallowing. Negative COVID test at [facility] per patient.

## 2021-11-01 NOTE — ED Provider Notes (Signed)
[facility] Provider Note    Event Date/Time   First MD Initiated Contact with Patient 11/01/21 1919     (approximate)   History   Chief Complaint Hypertension   HPI  Gerald Hodges is a 86 y.o. male with past medical history of hypertension, hyperlipidemia, CAD, CKD, CHF, and ventricular tachycardia who presents to the ED for hypertension.  History is limited as patient is Spanish-speaking only, history obtained via interpreter [phone].  Patient reports that he has been dealing with increasing cough and weakness for about the past 2 weeks.  This is been associated with pain on both sides of his upper back that he describes as sharp and worse when he coughs.  He denies any falls or other trauma to his back, has not had any numbness or weakness in his extremities.  He has not had any pain in his chest, but does state he feels slightly short of breath.  He has not noticed any pain or swelling in his legs.  He went to see his PCP for the cough earlier today, was noted to be significantly hypertensive at that time.  Patient states he has been compliant with his losartan and Coreg with no recent missed doses.  He was given a dose of clonidine at the office and referred to the ED for further evaluation.     Physical Exam   Triage Vital Signs: ED Triage Vitals [11/01/21 1655]  Enc Vitals Group     BP (!) 199/108     Pulse Rate 63     Resp 18     Temp 98.1 F (36.7 C)     Temp Source Oral     SpO2 99 %     Weight      Height      Head Circumference      Peak Flow      Pain Score      Pain Loc      Pain Edu?      Excl. in GC?     Most recent vital signs: Vitals:   11/01/21 1925 11/01/21 2157  BP: (!) 197/107 (!) 147/103  Pulse: 63 64  Resp: 18 17  Temp: 97.9 F (36.6 C) 97.9 F (36.6 C)  SpO2: 96% 97%    Constitutional: Alert and oriented. Eyes: Conjunctivae are normal. Head: Atraumatic. Nose: No congestion/rhinnorhea. Mouth/Throat:  Mucous membranes are moist.  Cardiovascular: Normal rate, regular rhythm. Grossly normal heart sounds.  2+ radial pulses bilaterally. Respiratory: Normal respiratory effort.  No retractions. Lungs CTAB. Gastrointestinal: Soft and nontender. No distention. Musculoskeletal: No lower extremity tenderness nor edema.  Neurologic:  Normal speech and language. No gross focal neurologic deficits are appreciated.    ED Results / Procedures / Treatments   Labs (all labs ordered are listed, but only abnormal results are displayed) Labs Reviewed  COMPREHENSIVE METABOLIC PANEL - Abnormal; Notable for the following components:      Result Value   Sodium 130 (*)    Chloride 96 (*)    Glucose, Bld 109 (*)    Total Bilirubin 1.4 (*)    All other components within normal limits  CBC WITH DIFFERENTIAL/PLATELET  BRAIN NATRIURETIC PEPTIDE  TROPONIN I (HIGH SENSITIVITY)     EKG  ED ECG REPORT I, Chesley Noon, the attending physician, personally viewed and interpreted this ECG.   Date: 11/01/2021  EKG Time: 19:33  Rate: 59  Rhythm: sinus bradycardia  Axis: Normal  Intervals:first-degree A-V block  ST&T Change: None  RADIOLOGY CTA chest reviewed and interpreted by me with no pulmonary embolism, infiltrate, or pulmonary edema.  PROCEDURES:  Critical Care performed: No  Procedures   MEDICATIONS ORDERED IN ED: Medications  hydrALAZINE (APRESOLINE) injection 10 mg (10 mg Intravenous Given 11/01/21 2012)  spironolactone (ALDACTONE) tablet 12.5 mg (12.5 mg Oral Given 11/01/21 2014)  iohexol (OMNIPAQUE) 350 MG/ML injection 75 mL (75 mLs Intravenous Contrast Given 11/01/21 2054)     IMPRESSION / MDM / ASSESSMENT AND PLAN / ED COURSE  I reviewed the triage vital signs and the nursing notes.                              86 y.o. male with past medical history of hypertension, hyperlipidemia, CAD, CKD, CHF, and ventricular tachycardia who presents to the ED complaining of cough, weakness,  and mild difficulty breathing for the past 2 weeks, noted to be significantly hypertensive at his doctor's office earlier today.  Patient's presentation is most consistent with acute presentation with potential threat to life or bodily function.  Differential diagnosis includes, but is not limited to, ACS, PE, pneumonia, CHF, COPD, COVID-19, AKI, hypertensive emergency.  Patient nontoxic-appearing and in no acute distress, vital signs remarkable for elevated blood pressure at 197/107 but are otherwise reassuring.  He denies any chest pain and EKG shows sinus bradycardia with no ischemic changes.  With pleuritic pain in his upper back and some difficulty breathing, we will further assess with CTA of his chest to rule out PE.  We will also assess for acute on chronic CHF with imaging and add on BNP.  Troponin within normal limits and I have low suspicion for ACS at this time.  Renal function and electrolytes are also reassuring, no significant anemia or leukocytosis noted.  We will give dose of IV hydralazine for his blood pressure given he is currently bradycardic.  Per recent cardiology note, his losartan dose was increased and they had noted at that time he may benefit from spironolactone.  We will start low-dose spironolactone with hydralazine and reassess.  CTA chest is reassuring and shows no signs of PE, CHF, or pneumonia.  Patient's blood pressure gradually improving following dose of hydralazine as well as initial dose of spironolactone.  Patient is appropriate for discharge home with PCP follow-up for recheck of blood pressure, will be prescribed spironolactone.  No obvious cause for his cough at this time, will prescribe albuterol for use as needed and he was also counseled to discuss statin use with his PCP as this could be contributing to his cough.  He was counseled to return to the ED for new or worsening symptoms, patient and family agree with plan.      FINAL CLINICAL IMPRESSION(S) / ED  DIAGNOSES   Final diagnoses:  Hypertension, unspecified type  Subacute cough     Rx / DC Orders   ED Discharge Orders          Ordered    spironolactone (ALDACTONE) 25 MG tablet  Daily        11/01/21 2157    albuterol (VENTOLIN HFA) 108 (90 Base) MCG/ACT inhaler  Every 6 hours PRN       Note to Pharmacy: Please supply with spacer   11/01/21 2159             Note:  This document was prepared using Dragon voice recognition software and may include unintentional dictation errors.  Chesley Noon, MD 11/01/21 2215

## 2021-12-15 ENCOUNTER — Other Ambulatory Visit: Payer: Self-pay | Admitting: Unknown Physician Specialty

## 2021-12-15 ENCOUNTER — Ambulatory Visit
Admission: RE | Admit: 2021-12-15 | Discharge: 2021-12-15 | Disposition: A | Discharge status: Home / Self Care | Payer: Medicaid Other | Source: Ambulatory Visit | Attending: Unknown Physician Specialty | Admitting: Unknown Physician Specialty

## 2021-12-15 DIAGNOSIS — R053 Chronic cough: Secondary | ICD-10-CM

## 2021-12-22 ENCOUNTER — Emergency Department: Payer: Medicaid Other

## 2021-12-22 ENCOUNTER — Other Ambulatory Visit: Payer: Self-pay

## 2021-12-22 ENCOUNTER — Encounter (HOSPITAL_COMMUNITY): Payer: Self-pay

## 2021-12-22 ENCOUNTER — Inpatient Hospital Stay
Admission: EM | Admit: 2021-12-22 | Discharge: 2021-12-22 | DRG: 309 | Disposition: A | Discharge status: Other Acute Inpatient Hospital | Payer: Medicaid Other | Source: Ambulatory Visit | Attending: Obstetrics and Gynecology | Admitting: Obstetrics and Gynecology

## 2021-12-22 DIAGNOSIS — I440 Atrioventricular block, first degree: Secondary | ICD-10-CM | POA: Diagnosis present

## 2021-12-22 DIAGNOSIS — H905 Unspecified sensorineural hearing loss: Secondary | ICD-10-CM | POA: Diagnosis present

## 2021-12-22 DIAGNOSIS — I2510 Atherosclerotic heart disease of native coronary artery without angina pectoris: Secondary | ICD-10-CM | POA: Diagnosis present

## 2021-12-22 DIAGNOSIS — Z79890 Hormone replacement therapy (postmenopausal): Secondary | ICD-10-CM | POA: Diagnosis not present

## 2021-12-22 DIAGNOSIS — Z79899 Other long term (current) drug therapy: Secondary | ICD-10-CM

## 2021-12-22 DIAGNOSIS — N1832 Chronic kidney disease, stage 3b: Secondary | ICD-10-CM | POA: Diagnosis present

## 2021-12-22 DIAGNOSIS — Z961 Presence of intraocular lens: Secondary | ICD-10-CM | POA: Diagnosis present

## 2021-12-22 DIAGNOSIS — I340 Nonrheumatic mitral (valve) insufficiency: Secondary | ICD-10-CM | POA: Diagnosis present

## 2021-12-22 DIAGNOSIS — I472 Ventricular tachycardia, unspecified: Secondary | ICD-10-CM | POA: Diagnosis present

## 2021-12-22 DIAGNOSIS — I10 Essential (primary) hypertension: Secondary | ICD-10-CM | POA: Diagnosis present

## 2021-12-22 DIAGNOSIS — Z8616 Personal history of COVID-19: Secondary | ICD-10-CM

## 2021-12-22 DIAGNOSIS — Z7902 Long term (current) use of antithrombotics/antiplatelets: Secondary | ICD-10-CM | POA: Diagnosis not present

## 2021-12-22 DIAGNOSIS — Z9841 Cataract extraction status, right eye: Secondary | ICD-10-CM

## 2021-12-22 DIAGNOSIS — Z96652 Presence of left artificial knee joint: Secondary | ICD-10-CM | POA: Diagnosis present

## 2021-12-22 DIAGNOSIS — E785 Hyperlipidemia, unspecified: Secondary | ICD-10-CM | POA: Diagnosis present

## 2021-12-22 DIAGNOSIS — I5042 Chronic combined systolic (congestive) and diastolic (congestive) heart failure: Secondary | ICD-10-CM | POA: Diagnosis present

## 2021-12-22 DIAGNOSIS — R778 Other specified abnormalities of plasma proteins: Secondary | ICD-10-CM

## 2021-12-22 DIAGNOSIS — I130 Hypertensive heart and chronic kidney disease with heart failure and stage 1 through stage 4 chronic kidney disease, or unspecified chronic kidney disease: Secondary | ICD-10-CM | POA: Diagnosis present

## 2021-12-22 DIAGNOSIS — K219 Gastro-esophageal reflux disease without esophagitis: Secondary | ICD-10-CM | POA: Diagnosis present

## 2021-12-22 DIAGNOSIS — Z9842 Cataract extraction status, left eye: Secondary | ICD-10-CM

## 2021-12-22 DIAGNOSIS — I700 Atherosclerosis of aorta: Secondary | ICD-10-CM | POA: Diagnosis present

## 2021-12-22 DIAGNOSIS — M109 Gout, unspecified: Secondary | ICD-10-CM | POA: Diagnosis present

## 2021-12-22 DIAGNOSIS — Z8673 Personal history of transient ischemic attack (TIA), and cerebral infarction without residual deficits: Secondary | ICD-10-CM | POA: Diagnosis not present

## 2021-12-22 DIAGNOSIS — I5030 Unspecified diastolic (congestive) heart failure: Secondary | ICD-10-CM | POA: Diagnosis present

## 2021-12-22 DIAGNOSIS — N183 Chronic kidney disease, stage 3 unspecified: Secondary | ICD-10-CM | POA: Diagnosis present

## 2021-12-22 DIAGNOSIS — E039 Hypothyroidism, unspecified: Secondary | ICD-10-CM | POA: Diagnosis present

## 2021-12-22 HISTORY — DX: Atherosclerotic heart disease of native coronary artery without angina pectoris: I25.10

## 2021-12-22 HISTORY — DX: Nonrheumatic mitral (valve) insufficiency: I34.0

## 2021-12-22 LAB — PROTIME-INR
INR: 1.1 (ref 0.8–1.2)
Prothrombin Time: 14.3 seconds (ref 11.4–15.2)

## 2021-12-22 LAB — CBC WITH DIFFERENTIAL/PLATELET
Abs Immature Granulocytes: 0.02 10*3/uL (ref 0.00–0.07)
Basophils Absolute: 0.1 10*3/uL (ref 0.0–0.1)
Basophils Relative: 1 %
Eosinophils Absolute: 0.1 10*3/uL (ref 0.0–0.5)
Eosinophils Relative: 2 %
HCT: 40.2 % (ref 39.0–52.0)
Hemoglobin: 13.8 g/dL (ref 13.0–17.0)
Immature Granulocytes: 0 %
Lymphocytes Relative: 38 %
Lymphs Abs: 2.1 10*3/uL (ref 0.7–4.0)
MCH: 31.6 pg (ref 26.0–34.0)
MCHC: 34.3 g/dL (ref 30.0–36.0)
MCV: 92 fL (ref 80.0–100.0)
Monocytes Absolute: 0.9 10*3/uL (ref 0.1–1.0)
Monocytes Relative: 16 %
Neutro Abs: 2.4 10*3/uL (ref 1.7–7.7)
Neutrophils Relative %: 43 %
Platelets: 284 10*3/uL (ref 150–400)
RBC: 4.37 MIL/uL (ref 4.22–5.81)
RDW: 13.5 % (ref 11.5–15.5)
WBC: 5.5 10*3/uL (ref 4.0–10.5)
nRBC: 0 % (ref 0.0–0.2)

## 2021-12-22 LAB — TROPONIN I (HIGH SENSITIVITY)
Troponin I (High Sensitivity): 116 ng/L (ref <18)
Troponin I (High Sensitivity): 165 ng/L (ref <18)

## 2021-12-22 LAB — COMPREHENSIVE METABOLIC PANEL
ALT: 43 U/L (ref 0–44)
AST: 40 U/L (ref 15–41)
Albumin: 3.7 g/dL (ref 3.5–5.0)
Alkaline Phosphatase: 57 U/L (ref 38–126)
Anion gap: 9 (ref 5–15)
BUN: 22 mg/dL (ref 8–23)
CO2: 27 mmol/L (ref 22–32)
Calcium: 9.9 mg/dL (ref 8.9–10.3)
Chloride: 97 mmol/L — ABNORMAL LOW (ref 98–111)
Creatinine, Ser: 1.58 mg/dL — ABNORMAL HIGH (ref 0.61–1.24)
GFR, Estimated: 43 mL/min — ABNORMAL LOW (ref >60)
Glucose, Bld: 136 mg/dL — ABNORMAL HIGH (ref 70–99)
Potassium: 3.7 mmol/L (ref 3.5–5.1)
Sodium: 133 mmol/L — ABNORMAL LOW (ref 135–145)
Total Bilirubin: 1.5 mg/dL — ABNORMAL HIGH (ref 0.3–1.2)
Total Protein: 6.5 g/dL (ref 6.5–8.1)

## 2021-12-22 LAB — ETHANOL: Alcohol, Ethyl (B): <10 mg/dL (ref <10)

## 2021-12-22 LAB — RESP PANEL BY RT-PCR (FLU A&B, COVID) ARPGX2
Influenza A by PCR: NEGATIVE
Influenza B by PCR: NEGATIVE
SARS Coronavirus 2 by RT PCR: NEGATIVE

## 2021-12-22 LAB — MAGNESIUM: Magnesium: 2.1 mg/dL (ref 1.7–2.4)

## 2021-12-22 LAB — TSH: TSH: 8.393 u[IU]/mL — ABNORMAL HIGH (ref 0.350–4.500)

## 2021-12-22 MED ORDER — AMIODARONE IV BOLUS ONLY 150 MG/100ML
INTRAVENOUS | Status: AC
  Administered 2021-12-22: 150 mg via INTRAVENOUS
  Filled 2021-12-22: qty 100

## 2021-12-22 MED ORDER — AMIODARONE LOAD VIA INFUSION
150.0000 mg | Freq: Once | INTRAVENOUS | Status: AC
  Administered 2021-12-22: 150 mg via INTRAVENOUS
  Filled 2021-12-22: qty 83.34

## 2021-12-22 MED ORDER — LOSARTAN POTASSIUM 50 MG PO TABS: 50.0000 mg | ORAL_TABLET | Freq: Every day | ORAL | Status: DC

## 2021-12-22 MED ORDER — DOCUSATE SODIUM 100 MG PO CAPS
100.0000 mg | ORAL_CAPSULE | Freq: Every day | ORAL | Status: DC
  Administered 2021-12-22: 100 mg via ORAL
  Filled 2021-12-22: qty 1

## 2021-12-22 MED ORDER — AMIODARONE HCL IN DEXTROSE 360-4.14 MG/200ML-% IV SOLN
60.0000 mg/h | INTRAVENOUS | Status: AC
  Administered 2021-12-22: 60 mg/h via INTRAVENOUS
  Filled 2021-12-22: qty 200

## 2021-12-22 MED ORDER — SODIUM BICARBONATE 8.4 % IV SOLN
50.0000 meq | Freq: Once | INTRAVENOUS | Status: AC
  Administered 2021-12-22: 50 meq via INTRAVENOUS
  Filled 2021-12-22: qty 50

## 2021-12-22 MED ORDER — SODIUM CHLORIDE 0.9% FLUSH: 3.0000 mL | Freq: Two times a day (BID) | INTRAVENOUS | Status: DC

## 2021-12-22 MED ORDER — ROSUVASTATIN CALCIUM 10 MG PO TABS: 10.0000 mg | ORAL_TABLET | Freq: Every day | ORAL | Status: DC

## 2021-12-22 MED ORDER — AMIODARONE HCL IN DEXTROSE 360-4.14 MG/200ML-% IV SOLN
30.0000 mg/h | INTRAVENOUS | Status: DC
  Administered 2021-12-22 (×4): 30 mg/h via INTRAVENOUS
  Filled 2021-12-22 (×2): qty 200

## 2021-12-22 MED ORDER — ENOXAPARIN SODIUM 40 MG/0.4ML IJ SOSY
40.0000 mg | PREFILLED_SYRINGE | INTRAMUSCULAR | Status: DC
  Administered 2021-12-22: 40 mg via SUBCUTANEOUS
  Filled 2021-12-22: qty 0.4

## 2021-12-22 MED ORDER — AMIODARONE IV BOLUS ONLY 150 MG/100ML
150.0000 mg | Freq: Once | INTRAVENOUS | Status: DC
  Filled 2021-12-22: qty 100

## 2021-12-22 MED ORDER — CALCIUM GLUCONATE 10 % IV SOLN
1.0000 g | Freq: Once | INTRAVENOUS | Status: AC
  Administered 2021-12-22: 1 g via INTRAVENOUS
  Filled 2021-12-22: qty 10

## 2021-12-22 MED ORDER — FUROSEMIDE 40 MG PO TABS: 40.0000 mg | ORAL_TABLET | Freq: Every day | ORAL | Status: DC

## 2021-12-22 MED ORDER — PANTOPRAZOLE SODIUM 40 MG PO TBEC
40.0000 mg | DELAYED_RELEASE_TABLET | Freq: Every day | ORAL | Status: DC
  Filled 2021-12-22: qty 1

## 2021-12-22 MED ORDER — ONDANSETRON HCL 4 MG/2ML IJ SOLN
4.0000 mg | Freq: Once | INTRAMUSCULAR | Status: DC
  Filled 2021-12-22: qty 2

## 2021-12-22 MED ORDER — SODIUM CHLORIDE 0.9 % IV SOLN: 250.0000 mL | INTRAVENOUS | Status: DC | PRN

## 2021-12-22 MED ORDER — AMIODARONE HCL IN DEXTROSE 360-4.14 MG/200ML-% IV SOLN
INTRAVENOUS | Status: AC
  Administered 2021-12-22: 60 mg/h via INTRAVENOUS
  Filled 2021-12-22: qty 200

## 2021-12-22 MED ORDER — LIDOCAINE BOLUS VIA INFUSION
100.0000 mg | Freq: Once | INTRAVENOUS | Status: AC
  Administered 2021-12-22: 100 mg via INTRAVENOUS
  Filled 2021-12-22: qty 100

## 2021-12-22 MED ORDER — LEVOTHYROXINE SODIUM 50 MCG PO TABS: 100.0000 ug | ORAL_TABLET | Freq: Every day | ORAL | Status: DC

## 2021-12-22 MED ORDER — SODIUM CHLORIDE 0.9% FLUSH: 3.0000 mL | INTRAVENOUS | Status: DC | PRN

## 2021-12-22 MED ORDER — TRAZODONE HCL 50 MG PO TABS
50.0000 mg | ORAL_TABLET | Freq: Every day | ORAL | Status: DC
  Administered 2021-12-22: 50 mg via ORAL
  Filled 2021-12-22: qty 1

## 2021-12-22 MED ORDER — ALLOPURINOL 100 MG PO TABS: 100.0000 mg | ORAL_TABLET | Freq: Every day | ORAL | Status: DC

## 2021-12-22 MED ORDER — CARVEDILOL 6.25 MG PO TABS
3.1250 mg | ORAL_TABLET | Freq: Two times a day (BID) | ORAL | Status: DC
  Administered 2021-12-22: 3.125 mg via ORAL
  Filled 2021-12-22: qty 1

## 2021-12-22 MED ORDER — LIDOCAINE IN D5W 4-5 MG/ML-% IV SOLN
1.0000 mg/min | INTRAVENOUS | Status: DC
  Administered 2021-12-22: 1 mg/min via INTRAVENOUS
  Filled 2021-12-22: qty 500

## 2021-12-22 NOTE — ED Notes (Signed)
Interpreter used to obtain consent to transfer and get signature.

## 2021-12-22 NOTE — ED Triage Notes (Signed)
Dr. Haroldine Laws notified that pt has converted to V-tach. Ordered to give another 150mg  amiodarone bolus which has been administered. Dr. Neal Dy that since pt is non-symptomatic at the time and denying complaints of CP or SOB to just monitor the patient. He stated that if the pt becomes unstable/symptomatic to shock him. Sts he will make some calls on his end in regards to treatment for the pt.

## 2021-12-22 NOTE — ED Notes (Signed)
April from [facility] called for report. Sts ETA is approx 30 mins.

## 2021-12-22 NOTE — Progress Notes (Signed)
   Patient with recurrent VT rates 120-150 despite IV amio and lidocaine.   Currently asymptomatic with SBP 110.   Patient broke with additional amio bolus.   Spoke with EP. Will transfer to [facility] ICU for further management of rapid VT.   Dr. Caryl Comes on EP Call and is aware.  Glori Bickers, MD  9:09 PM

## 2021-12-22 NOTE — ED Notes (Addendum)
Per cardiologist Sharolyn Douglas, NP another 150mg  amio bolus given

## 2021-12-22 NOTE — Discharge Summary (Addendum)
Physician Discharge Summary   Patient: Gerald Hodges MRN: [id number] DOB: Nov 18, 1935  Admit date:     12/22/2021  Discharge date: 12/22/21  Discharge Physician: Sharion Settler   PCP: Dionicia Abler, NP (Inactive)   Recommendations at discharge:   Discharge Diagnoses: Principal Problem:   Ventricular tachycardia [facility]) Active Problems:   Essential hypertension   Chronic kidney disease, stage III (moderate) (HCC)   CAD (coronary artery disease)   Hypothyroidism   (HFpEF) heart failure with preserved ejection fraction ([place])  Resolved Problems:   * No resolved hospital problems. *  Hospital Course: : Gerald Hodges is a 86 y.o. male with medical history significant for htn, ckd 3b, cad non-obstructive, chf, mitral regurg, and v tach, who presents with the above.   Brought in by ambulance. Developed substernal chest pain/pressure a few days ago that has been intermittent. Has also felt lightheaded. This morning he experienced worsened chest pressure and a sensation "like I'm going to die." Nitro didn't help. Went to PCP today, ekg there showed wide complex tachycardia so EMS was called, received several drugs by them including adenosine, calcium, and sodium bicarb. Converted to sinus but since has required total 4 amiodarone boluses and addition of lidocaine for return of v tach. He only reports now very mild chest pain with cough and is in sinus rhythm  Per Dr Sung Amabile, with cardiology, need transfer to [facility] for EP specialty services. Discussed with patient and family who is in agreement with the plan  Assessment and Plan: Neuro - alert and oriented x 4. MAE equally CV - SR in 60s, no LE edema, pedal radial pulses equal bilaterally. BP stable  Abdomen - soft, non distended Pulm - BLL diminished, upper airways clear, no shortness of breath Consultants: cardiology Procedures performed:   Disposition: transfer to [facility] hospital for EP  services Diet recommendation:  Cardiac consistant carb DISCHARGE MEDICATION:   Discharge Exam: Filed Weights   12/22/21 1125  Weight: 87.5 kg     Condition at discharge: stable  The results of significant diagnostics from this hospitalization (including imaging, microbiology, ancillary and laboratory) are listed below for reference.   Imaging Studies: DG Chest Portable 1 View  Result Date: 12/22/2021 CLINICAL DATA:  Chest pain with shortness of breath. SVT. Unresponsiveness. EXAM: PORTABLE CHEST 1 VIEW COMPARISON:  Radiographs 04/02/2021 and 09/15/2020.  CT 11/01/2021. FINDINGS: 1242 hours. Persistent low lung volumes. The heart size and mediastinal contours are stable. Bibasilar atelectasis, similar to previous study. No superimposed edema, confluent airspace opacity, pneumothorax or significant pleural effusion. No acute osseous findings are evident. Telemetry leads overlie the chest. IMPRESSION: Persistent low lung volumes and bibasilar atelectasis. No acute cardiopulmonary process. Electronically Signed   By: Richardean Sale M.D.   On: 12/22/2021 13:08   DG Sinuses Complete  Result Date: 12/16/2021 CLINICAL DATA:  Chronic cough EXAM: PARANASAL SINUSES - COMPLETE 3 + VIEW COMPARISON:  None Available. FINDINGS: The paranasal sinus are aerated. There is no evidence of sinus opacification air-fluid levels or mucosal thickening. No significant bone abnormalities are seen. IMPRESSION: Negative. Electronically Signed   By: Lucienne Capers M.D.   On: 12/16/2021 19:46    Microbiology: Results for orders placed or performed during the hospital encounter of 12/22/21  Resp Panel by RT-PCR (Flu A&B, Covid) Anterior Nasal Swab     Status: None   Collection Time: 12/22/21 11:34 AM   Specimen: Anterior Nasal Swab  Result Value Ref Range Status   SARS Coronavirus 2 by RT  PCR NEGATIVE NEGATIVE Final    Comment: (NOTE) SARS-CoV-2 target nucleic acids are NOT DETECTED.  The SARS-CoV-2 RNA is  generally detectable in upper respiratory specimens during the acute phase of infection. The lowest concentration of SARS-CoV-2 viral copies this assay can detect is 138 copies/mL. A negative result does not preclude SARS-Cov-2 infection and should not be used as the sole basis for treatment or other patient management decisions. A negative result may occur with  improper specimen collection/handling, submission of specimen other than nasopharyngeal swab, presence of viral mutation(s) within the areas targeted by this assay, and inadequate number of viral copies(<138 copies/mL). A negative result must be combined with clinical observations, patient history, and epidemiological information. The expected result is Negative.  Fact Sheet for Patients:  EntrepreneurPulse.com.au  Fact Sheet for Healthcare Providers:  IncredibleEmployment.be  This test is no t yet approved or cleared by the [place] FDA and  has been authorized for detection and/or diagnosis of SARS-CoV-2 by FDA under an Emergency Use Authorization (EUA). This EUA will remain  in effect (meaning this test can be used) for the duration of the COVID-19 declaration under Section 564(b)(1) of the Act, 21 U.S.C.section 360bbb-3(b)(1), unless the authorization is terminated  or revoked sooner.       Influenza A by PCR NEGATIVE NEGATIVE Final   Influenza B by PCR NEGATIVE NEGATIVE Final    Comment: (NOTE) The Xpert Xpress SARS-CoV-2/FLU/RSV plus assay is intended as an aid in the diagnosis of influenza from Nasopharyngeal swab specimens and should not be used as a sole basis for treatment. Nasal washings and aspirates are unacceptable for Xpert Xpress SARS-CoV-2/FLU/RSV testing.  Fact Sheet for Patients: EntrepreneurPulse.com.au  Fact Sheet for Healthcare Providers: IncredibleEmployment.be  This test is not yet approved or cleared by the [place] FDA and has been authorized for detection and/or diagnosis of SARS-CoV-2 by FDA under an Emergency Use Authorization (EUA). This EUA will remain in effect (meaning this test can be used) for the duration of the COVID-19 declaration under Section 564(b)(1) of the Act, 21 U.S.C. section 360bbb-3(b)(1), unless the authorization is terminated or revoked.  Performed at [facility], [address] [place], [place] [id number]     Labs: CBC: Recent Labs  Lab 12/22/21 1123  WBC 5.5  NEUTROABS 2.4  HGB 13.8  HCT 40.2  MCV 92.0  PLT [id number]   Basic Metabolic Panel: Recent Labs  Lab 12/22/21 1330  NA 133*  K 3.7  CL 97*  CO2 27  GLUCOSE 136*  BUN 22  CREATININE 1.58*  CALCIUM 9.9  MG 2.1   Liver Function Tests: Recent Labs  Lab 12/22/21 1330  AST 40  ALT 43  ALKPHOS 57  BILITOT 1.5*  PROT 6.5  ALBUMIN 3.7   CBG: No results for input(s): "GLUCAP" in the last 168 hours.  Discharge time spent: less than 30 minutes.  Signed: Sharion Settler, NP [facility] 12/22/2021

## 2021-12-22 NOTE — H&P (Signed)
History and Physical    [name] [id number] DOB: 02/14/1936 DOA: 12/22/2021  PCP: Dionicia Abler, NP (Inactive)  Patient coming from: home   Chief Complaint: chest pain  HPI: Gerald Hodges is a 86 y.o. male with medical history significant for htn, ckd 3b, cad non-obstructive, chf, mitral regurg, and v tach, who presents with the above.  Brought in by ambulance. Developed substernal chest pain/pressure a few days ago that has been intermittent. Has also felt lightheaded. This morning he experienced worsened chest pressure and a sensation "like I'm going to die." Nitro didn't help. Went to PCP today, ekg there showed wide complex tachycardia so EMS was called, received several drugs by them including adenosine, calcium, and sodium bicarb. Converted to sinus but since then has had recurrent v tach. He has a history of this and is followed by cardiology. Here he has received IV amiodarone. At the time of my eval he says the uncomfortable substernal sensation continues.     Review of Systems: As per HPI otherwise 10 point review of systems negative.    Past Medical History:  Diagnosis Date   (HFmrEF) heart failure with midrange ejection fraction (Gerald Hodges)    a.MPI 12/12/2019: EF 42%. b. TTE 12/17/2019: EF 55-60%; c. 03/2021 Echo: EF 55-60%, GrI DD, mod MR; d. 05/2021 cMRI: LVEF 46%, RVEF 46%, >50% thickness subendocardial LGE sugg of prior infarct in LCX distribution. At least mod MR.   Abdominal aortic atherosclerosis (Gerald Hodges)    a. MPI 12/12/2019--> very mild.   Anginal pain (Gerald Hodges)    CKD (chronic kidney disease), stage III (HCC)    GERD (gastroesophageal reflux disease)    Gout    History of 2019 novel coronavirus disease (COVID-19) 09/10/2020   Hyperlipidemia    Hypertension    Hypothyroidism    Moderate mitral regurgitation    a. 03/2021 Echo: Mod MR; b. 05/2021 cMRI: at least mod MR.   Nonosbructive CAD (coronary artery disease)    a. 03/2021 Cath: LM nl, LAD  21m, D1 nl, LCX min irregs, RCA 20p/d, RPDA nl, RPAV min irregs, RPL1/2/3 nl.   Osteoarthritis of left knee    Psoriasis    Recurrent umbilical hernia    a. repaired 01/01/2020; recurred.   SNHL (sensory-neural hearing loss), asymmetrical    Sustained ventricular tachycardia ([place]) 04/02/2021   TIA (transient ischemic attack)     Past Surgical History:  Procedure Laterality Date   BUNIONECTOMY Bilateral    CATARACT EXTRACTION W/ INTRAOCULAR LENS  IMPLANT, BILATERAL Bilateral    LEFT HEART CATH AND CORONARY ANGIOGRAPHY N/A 04/04/2021   Procedure: LEFT HEART CATH AND CORONARY ANGIOGRAPHY;  Surgeon: Gerald Hampshire, MD;  Location: [facility];  Service: Cardiovascular;  Laterality: N/A;   TOTAL KNEE ARTHROPLASTY Left 10/25/2016   Procedure: TOTAL KNEE ARTHROPLASTY;  Surgeon: Gerald Leys, MD;  Location: [facility];  Service: Orthopedics;  Laterality: Left;   UMBILICAL HERNIA REPAIR N/A 01/01/2020   Procedure: HERNIA REPAIR UMBILICAL ADULT;  Surgeon: Gerald Ree, MD;  Location: [facility];  Service: General;  Laterality: N/A;   XI ROBOTIC ASSISTED INGUINAL HERNIA REPAIR WITH MESH Left 01/01/2020   Procedure: XI ROBOTIC ASSISTED INGUINAL HERNIA REPAIR WITH MESH;  Surgeon: Gerald Ree, MD;  Location: [facility];  Service: General;  Laterality: Left;     reports that he has never smoked. He has never used smokeless tobacco. He reports that he does not drink alcohol and does not use drugs.  Allergies  Allergen Reactions   Aspirin  Cough   Lisinopril Itching    Throat itching     Family History  Problem Relation Age of Onset   Cancer Mother        stomach   Heart disease Neg Hx     Prior to Admission medications   Medication Sig Start Date End Date Taking? Authorizing Provider  allopurinol (ZYLOPRIM) 100 MG tablet TAKE 1 TABLET(100 MG) BY MOUTH DAILY 07/27/20  Yes Cannady, Jolene T, NP  amiodarone (PACERONE) 200 MG tablet Take 200 mg by mouth daily.   Yes [provider]  amLODipine (NORVASC) 5 MG tablet Take 5 mg by mouth daily. 12/11/21  Yes [provider]  benzonatate (TESSALON) 100 MG capsule Take 100 mg by mouth 3 (three) times daily. 12/16/21  Yes [provider]  carvedilol (COREG) 3.125 MG tablet Take 3.125 mg by mouth 2 (two) times daily. 11/15/21  Yes [provider]  carvedilol (COREG) 6.25 MG tablet TAKE 1 TABLET(6.25 MG) BY MOUTH TWICE DAILY WITH A MEAL 01/27/21  Yes Cannady, Jolene T, NP  chlorpheniramine-HYDROcodone (TUSSIONEX) 10-8 MG/5ML Take 5 mLs by mouth at bedtime as needed. 12/06/21  Yes [provider]  clopidogrel (PLAVIX) 75 MG tablet Take 1 tablet (75 mg total) by mouth daily. 07/27/20  Yes Marnee Guarneri T, NP  furosemide (LASIX) 40 MG tablet Take 1 tablet (40 mg) by mouth once daily 08/11/21  Yes Deboraha Sprang, MD  isosorbide mononitrate (IMDUR) 120 MG 24 hr tablet Take 1 tablet (120 mg) by mouth once daily AT BEDTIME 08/11/21  Yes Deboraha Sprang, MD  isosorbide mononitrate (IMDUR) 30 MG 24 hr tablet Take 2 tablets by mouth 2 (two) times daily. 08/16/21  Yes [provider]  levothyroxine (SYNTHROID) 100 MCG tablet Take 1 tablet (100 mcg total) by mouth daily before breakfast. 08/18/21  Yes Deboraha Sprang, MD  levothyroxine (SYNTHROID) 75 MCG tablet Take 50 mcg by mouth daily. 08/18/21  Yes [provider]  losartan (COZAAR) 50 MG tablet Take 100 mg by mouth daily. 08/23/21  Yes [provider]  magnesium oxide (MAG-OX) 400 MG tablet Take 1 tablet by mouth at bedtime. 11/15/21  Yes [provider]  Magnesium Oxide 400 MG CAPS Take 1 capsule (400 mg total) by mouth daily. 04/29/21  Yes Dunn, Areta Haber, PA-C  pantoprazole (PROTONIX) 40 MG tablet Take 1 tablet (40 mg total) by mouth daily. 07/27/20  Yes Cannady, Henrine Screws T, NP  rosuvastatin (CRESTOR) 10 MG tablet Take 10 MG (one tablet) by mouth 3 days a week (Monday, Wednesday, Friday). 07/28/20  Yes Cannady, Jolene T, NP   traZODone (DESYREL) 50 MG tablet Take 50 mg by mouth at bedtime.   Yes [provider]  VITAMIN D-1000 MAX ST 25 MCG (1000 UT) tablet Take 1,000 Units by mouth daily. 03/22/21  Yes [provider]  acetaminophen (TYLENOL) 500 MG tablet Take 2 tablets (1,000 mg total) by mouth every 6 (six) hours as needed for mild pain. 12/14/20   Piscoya, Jacqulyn Bath, MD  acetic acid 2 % otic solution SMARTSIG:In Ear(s) 09/20/21   [provider]  albuterol (VENTOLIN HFA) 108 (90 Base) MCG/ACT inhaler Inhale 2 puffs into the lungs every 6 (six) hours as needed for wheezing or shortness of breath. 11/01/21   Blake Divine, MD  azelastine (ASTELIN) 0.1 % nasal spray Place 1 spray into both nostrils daily. 11/15/21   [provider]  calcium carbonate (TUMS - DOSED IN MG ELEMENTAL CALCIUM) 500 MG chewable  tablet Chew 1 tablet by mouth 2 (two) times daily as needed for indigestion or heartburn.    [provider]  [employer] CORTISONE MAXIMUM STRENGTH 1 % lotion SMARTSIG:1 sparingly Topical Twice Daily 09/27/21   [provider]  docusate sodium (COLACE) 100 MG capsule Take 100 mg by mouth daily.    [provider]  Emollient (AQUAPHOR ADVANCED THERAPY BABY) OINT SMARTSIG:sparingly Topical Twice Daily 09/27/21   [provider]  fluticasone (FLONASE) 50 MCG/ACT nasal spray Place 1 spray into both nostrils daily. 09/08/21   [provider]  loratadine (CLARITIN) 10 MG tablet Take 1 tablet (10 mg total) by mouth daily. 03/30/21   Cannady, Henrine Screws T, NP  losartan (COZAAR) 100 MG tablet Take 1 tablet (100 mg total) by mouth daily. 08/11/21 11/09/21  Deboraha Sprang, MD  nitroGLYCERIN (NITROSTAT) 0.4 MG SL tablet Place 1 tablet (0.4 mg total) under the tongue every 5 (five) minutes as needed for chest pain (chest pain). Maximum of 3 doses. 04/11/19   Dunn, Ryan M, PA-C  PATADAY 0.2 % SOLN Apply 1 drop to eye daily. 11/15/21   [provider]  polyethylene glycol  (MIRALAX / GLYCOLAX) 17 g packet Take 17 g by mouth daily as needed.    [provider]  spironolactone (ALDACTONE) 25 MG tablet Take 1 tablet (25 mg total) by mouth daily. 11/01/21 12/01/21  Blake Divine, MD    Physical Exam: Vitals:   12/22/21 1450 12/22/21 1500 12/22/21 1505 12/22/21 1555  BP: (!) 123/95 (!) 119/97 (!) 121/101 104/85  Pulse:   (!) 147 (!) 145  Resp: 17 (!) 22 (!) 23 (!) 24  Temp:      TempSrc:      SpO2:   99% 90%  Weight:      Height:        Constitutional: No acute distress Head: Atraumatic Eyes: Conjunctiva clear ENM: Moist mucous membranes. Normal dentition.  Neck: Supple Respiratory: Clear to auscultation bilaterally, no wheezing/rales/rhonchi. Normal respiratory effort. No accessory muscle use. . Cardiovascular: Regular rate and rhythm. No murmurs/rubs/gallops. Abdomen: Non-tender, non-distended. No masses. No rebound or guarding. Positive bowel sounds. Musculoskeletal: No joint deformity upper and lower extremities. Normal ROM, no contractures. Normal muscle tone.  Skin: No rashes, lesions, or ulcers.  Extremities: No peripheral edema. Palpable peripheral pulses. Neurologic: Alert, moving all 4 extremities. Psychiatric: Normal insight and judgement.   Labs on Admission: I have personally reviewed following labs and imaging studies  CBC: Recent Labs  Lab 12/22/21 1123  WBC 5.5  NEUTROABS 2.4  HGB 13.8  HCT 40.2  MCV 92.0  PLT [id number]   Basic Metabolic Panel: Recent Labs  Lab 12/22/21 1330  NA 133*  K 3.7  CL 97*  CO2 27  GLUCOSE 136*  BUN 22  CREATININE 1.58*  CALCIUM 9.9  MG 2.1   GFR: Estimated Creatinine Clearance: 36.7 mL/min (A) (by C-G formula based on SCr of 1.58 mg/dL (H)). Liver Function Tests: Recent Labs  Lab 12/22/21 1330  AST 40  ALT 43  ALKPHOS 57  BILITOT 1.5*  PROT 6.5  ALBUMIN 3.7   No results for input(s): "LIPASE", "AMYLASE" in the last 168 hours. No results for input(s): "AMMONIA" in the last  168 hours. Coagulation Profile: Recent Labs  Lab 12/22/21 1123  INR 1.1   Cardiac Enzymes: No results for input(s): "CKTOTAL", "CKMB", "CKMBINDEX", "TROPONINI" in the last 168 hours. BNP (last 3 results) No results for input(s): "PROBNP" in the last 8760 hours. HbA1C:  No results for input(s): "HGBA1C" in the last 72 hours. CBG: No results for input(s): "GLUCAP" in the last 168 hours. Lipid Profile: No results for input(s): "CHOL", "HDL", "LDLCALC", "TRIG", "CHOLHDL", "LDLDIRECT" in the last 72 hours. Thyroid Function Tests: Recent Labs    12/22/21 1330  TSH 8.393*   Anemia Panel: No results for input(s): "VITAMINB12", "FOLATE", "FERRITIN", "TIBC", "IRON", "RETICCTPCT" in the last 72 hours. Urine analysis:    Component Value Date/Time   COLORURINE STRAW (A) 10/16/2016 1054   APPEARANCEUR Clear 11/03/2019 0940   LABSPEC 1.010 10/16/2016 1054   PHURINE 6.0 10/16/2016 1054   GLUCOSEU Negative 11/03/2019 0940   HGBUR NEGATIVE 10/16/2016 1054   BILIRUBINUR Negative 11/03/2019 0940   KETONESUR NEGATIVE 10/16/2016 1054   PROTEINUR Trace (A) 11/03/2019 0940   PROTEINUR NEGATIVE 10/16/2016 1054   NITRITE Negative 11/03/2019 0940   NITRITE NEGATIVE 10/16/2016 1054   LEUKOCYTESUR Negative 11/03/2019 0940    Radiological Exams on Admission: DG Chest Portable 1 View  Result Date: 12/22/2021 CLINICAL DATA:  Chest pain with shortness of breath. SVT. Unresponsiveness. EXAM: PORTABLE CHEST 1 VIEW COMPARISON:  Radiographs 04/02/2021 and 09/15/2020.  CT 11/01/2021. FINDINGS: 1242 hours. Persistent low lung volumes. The heart size and mediastinal contours are stable. Bibasilar atelectasis, similar to previous study. No superimposed edema, confluent airspace opacity, pneumothorax or significant pleural effusion. No acute osseous findings are evident. Telemetry leads overlie the chest. IMPRESSION: Persistent low lung volumes and bibasilar atelectasis. No acute cardiopulmonary process.  Electronically Signed   By: Richardean Sale M.D.   On: 12/22/2021 13:08    EKG: Independently reviewed. Wide complex tachyarrhythmia  Assessment/Plan Principal Problem:   Ventricular tachycardia (HCC) Active Problems:   Essential hypertension   Chronic kidney disease, stage III (moderate) (HCC)   CAD (coronary artery disease)   Hypothyroidism   (HFpEF) heart failure with preserved ejection fraction ([place])   # Ventricular tachycardia Hemodynamically stable.  - pacer pads in place - amio infusion running - mgmt per cardiology - step-down status  # HTN Here BPs soft in the setting of above - hold home BP meds  # CKD 3b Kidney function more or less at baseline - monitor  # HFrEF # Mitral regurg, moderate Recent cardiac mri with EF of 46% - home meds on hold pending cardiology eval  # Hypothyroid TSH mildly elevated - cont home levothyroxine, will need outpt f/u  # [facility] participant [facility] participant, will need to notify them prior to discharge  DVT prophylaxis: lovenox Code Status: full  Family Communication: none @ bedside  Consults called: cardiology   Level of care: Stepdown Status is: Inpatient Remains inpatient appropriate because: severity of illness    Desma Maxim MD [facility] Pager [phone]  If 7PM-7AM, please contact night-coverage [email or web address] Password [email or web address]  12/22/2021, 4:40 PM

## 2021-12-22 NOTE — ED Triage Notes (Addendum)
Pt arrives via EMS from the doctors office for SVT and unresponsiveness- pt RA sats were 65%, pt initially placed on 4L [place] but continued to desat and then was placed on NRB- pt was SVT with a HR in the 180s with EMS- pt was given 6 then 12 of adenosine with no effect- pt was then given calcium and sodium bicarb and then converted to NSR- cbg was 151 per EMS

## 2021-12-22 NOTE — Consult Note (Signed)
Cardiology Consult    Patient ID: Gerald Hodges MRN: [id number], DOB/AGE: January 07, 1936   Admit date: 12/22/2021 Date of Consult: 12/22/2021  Primary Physician: Dionicia Abler, NP (Inactive) Primary Cardiologist: Nelva Bush, MD/EP: Olin Pia, MD Requesting Provider: Emilee Hero, MD  Patient Profile    Gerald Hodges is a 86 y.o. male with a history of sustained ventricular tachycardia, nonobstructive CAD, heart failure midrange ejection fraction (46% March 2023), hypertension, hyperlipidemia, hypothyroidism, moderate mitral regurgitation, and TIA, who is being seen today for the evaluation of sustained ventricular tachycardia at the request of Dr. Joni Fears.  Past Medical History   Past Medical History:  Diagnosis Date   (HFmrEF) heart failure with midrange ejection fraction ([place])    a.MPI 12/12/2019: EF 42%. b. TTE 12/17/2019: EF 55-60%; c. 03/2021 Echo: EF 55-60%, GrI DD, mod MR; d. 05/2021 cMRI: LVEF 46%, RVEF 46%, >50% thickness subendocardial LGE sugg of prior infarct in LCX distribution. At least mod MR.   Abdominal aortic atherosclerosis ([place])    a. MPI 12/12/2019--> very mild.   Anginal pain ([place])    CKD (chronic kidney disease), stage III (HCC)    GERD (gastroesophageal reflux disease)    Gout    History of 2019 novel coronavirus disease (COVID-19) 09/10/2020   Hyperlipidemia    Hypertension    Hypothyroidism    Moderate mitral regurgitation    a. 03/2021 Echo: Mod MR; b. 05/2021 cMRI: at least mod MR.   Nonosbructive CAD (coronary artery disease)    a. 03/2021 Cath: LM nl, LAD 84m, D1 nl, LCX min irregs, RCA 20p/d, RPDA nl, RPAV min irregs, RPL1/2/3 nl.   Osteoarthritis of left knee    Psoriasis    Recurrent umbilical hernia    a. repaired 01/01/2020; recurred.   SNHL (sensory-neural hearing loss), asymmetrical    Sustained ventricular tachycardia ([place]) 04/02/2021   TIA (transient ischemic attack)     Past Surgical History:  Procedure  Laterality Date   BUNIONECTOMY Bilateral    CATARACT EXTRACTION W/ INTRAOCULAR LENS  IMPLANT, BILATERAL Bilateral    LEFT HEART CATH AND CORONARY ANGIOGRAPHY N/A 04/04/2021   Procedure: LEFT HEART CATH AND CORONARY ANGIOGRAPHY;  Surgeon: Wellington Hampshire, MD;  Location: [place] [facility];  Service: Cardiovascular;  Laterality: N/A;   TOTAL KNEE ARTHROPLASTY Left 10/25/2016   Procedure: TOTAL KNEE ARTHROPLASTY;  Surgeon: Earnestine Leys, MD;  Location: [facility];  Service: Orthopedics;  Laterality: Left;   UMBILICAL HERNIA REPAIR N/A 01/01/2020   Procedure: HERNIA REPAIR UMBILICAL ADULT;  Surgeon: Olean Ree, MD;  Location: [facility];  Service: General;  Laterality: N/A;   XI ROBOTIC ASSISTED INGUINAL HERNIA REPAIR WITH MESH Left 01/01/2020   Procedure: XI ROBOTIC ASSISTED INGUINAL HERNIA REPAIR WITH MESH;  Surgeon: Olean Ree, MD;  Location: [facility];  Service: General;  Laterality: Left;     Allergies  Allergies  Allergen Reactions   Aspirin Cough   Lisinopril Itching    Throat itching     History of Present Illness    86 year old male with the above complex past medical history including sustained ventricular tachycardia, nonobstructive CAD, heart failure with midrange ejection fraction, hypertension, hyperlipidemia, hypothyroidism, moderate mitral regurgitation, and TIA.  Prior hospitalizations for presumed VT in 2015 and 2016.  He had previously been followed at [facility] and underwent stress testing in 2018 which showed a subtle, fixed mid inferolateral and basal defect consistent with possible artifact versus scar.  There was no significant coronary artery calcification at that time and the study  was felt to be low risk.  Echo in 2019 showed normal LV function with grade 1 diastolic dysfunction and mild MR.  In September 2021, he establish care with our practice for preoperative cardiovascular risk examination.  He underwent stress testing for risk stratification, and this showed a  small to moderate size region of predominantly fixed defect in the inferolateral wall, without ischemia.  He had mild aortic atherosclerosis without coronary calcifications.  EF was 42% but subsequent echo showed an EF of 55 to 60% in October 2021.  He underwent outpatient event monitoring in the fall 2022 in the setting of palpitations and positional dizziness, which showed a brief episode of nonsustained VT and 11 brief episodes of SVT up to 18 beats.  Echo in December 2022 showed an EF of 60 to 65% with mild MR.  Patient was admitted to [facility] in late January 2023 in the setting of diaphoresis and chest discomfort.  He was found to be in a wide-complex tachycardia consistent with ventricular tachycardia and underwent cardioversion in the emergency department.  He had only mild troponin elevation.  He was placed on intravenous amiodarone.  Echo was performed and showed an EF of 55 to 60% with mild to moderate MR and moderate TR.  Diagnostic catheterization revealed mild nonobstructive CAD without targets for intervention.  He was evaluated by Dr. Klein/electrophysiology with recommendation to continue amiodarone therapy and pursue outpatient cardiac MRI.  Cardiac MRI was performed in March 2023 revealing an LVEF of 46% with greater than 50% thickness subendocardial LGE suggestive of prior infarct in the circumflex distribution however, it was noted that diagnostic catheterization did not show any significant circumflex disease, and he has been medically managed.  He was doing well in June EP follow-up.  Patient lives locally with one of his sons.  This past week he has been staying with his other son but notes compliance with his medications.  Approximately 3 days ago, he started experiencing intermittent presyncope in the absence of chest pain, dyspnea, or palpitations.  At approximately 1 AM this morning, patient alerted his son that he was having mild chest pressure, dyspnea, and diaphoresis.   This persisted throughout the night and he took a sublingual nitroglycerin around 5 or 6 AM without relief.  Symptoms persisted, he took a second supplement nitroglycerin without relief.  Around 9 AM, they brought him to his primary care provider office and EMS was called due to wide-complex tachycardia.  ER notes indicate that there may have been an episode of unresponsiveness however, patient denies this.  ER notes also indicate that patient was hypoxic at primary care office-65% on room air and was eventually placed on a nonrebreather.  Upon EMS arrival, he was treated with 6 and then 12 mg of adenosine without effect, and subsequently treated with calcium and sodium bicarbonate with conversion to sinus rhythm prior to ED arrival.  Once here, he had recurrent wide-complex tachycardia/ventricular tachycardia.  He was placed on intravenous amiodarone and at this point, has received 3 separate 150 mg intravenous amiodarone boluses.  Heart rates here have been trending in the 140-150 range and patient currently asymptomatic.  Son at bedside and translating.  Inpatient Medications     ondansetron (ZOFRAN) IV  4 mg Intravenous Once   Home Medications    Prior to Admission medications   Medication Sig Start Date End Date Taking? Authorizing Provider  acetaminophen (TYLENOL) 500 MG tablet Take 2 tablets (1,000 mg total) by mouth every 6 (six) hours  as needed for mild pain. 12/14/20   Piscoya, Jacqulyn Bath, MD  acetic acid 2 % otic solution SMARTSIG:In Ear(s) 09/20/21   [provider]  albuterol (VENTOLIN HFA) 108 (90 Base) MCG/ACT inhaler Inhale 2 puffs into the lungs every 6 (six) hours as needed for wheezing or shortness of breath. 11/01/21   Blake Divine, MD  allopurinol (ZYLOPRIM) 100 MG tablet TAKE 1 TABLET(100 MG) BY MOUTH DAILY 07/27/20   Cannady, Henrine Screws T, NP  amiodarone (PACERONE) 200 MG tablet Take 200 mg by mouth daily.    [provider]  amLODipine (NORVASC) 5 MG tablet Take 5 mg by  mouth daily. 12/11/21   [provider]  azelastine (ASTELIN) 0.1 % nasal spray Place 1 spray into both nostrils daily. 11/15/21   [provider]  benzonatate (TESSALON) 100 MG capsule Take 100 mg by mouth 3 (three) times daily. 12/16/21   [provider]  calcium carbonate (TUMS - DOSED IN MG ELEMENTAL CALCIUM) 500 MG chewable tablet Chew 1 tablet by mouth 2 (two) times daily as needed for indigestion or heartburn.    [provider]  carvedilol (COREG) 3.125 MG tablet Take 3.125 mg by mouth 2 (two) times daily. 11/15/21   [provider]  carvedilol (COREG) 6.25 MG tablet TAKE 1 TABLET(6.25 MG) BY MOUTH TWICE DAILY WITH A MEAL 01/27/21   Cannady, Jolene T, NP  chlorpheniramine-HYDROcodone (TUSSIONEX) 10-8 MG/5ML Take 5 mLs by mouth at bedtime as needed. 12/06/21   [provider]  clopidogrel (PLAVIX) 75 MG tablet Take 1 tablet (75 mg total) by mouth daily. 07/27/20   Marnee Guarneri T, NP  [employer] CORTISONE MAXIMUM STRENGTH 1 % lotion SMARTSIG:1 sparingly Topical Twice Daily 09/27/21   [provider]  docusate sodium (COLACE) 100 MG capsule Take 100 mg by mouth daily.    [provider]  Emollient (AQUAPHOR ADVANCED THERAPY BABY) OINT SMARTSIG:sparingly Topical Twice Daily 09/27/21   [provider]  fluticasone (FLONASE) 50 MCG/ACT nasal spray Place 1 spray into both nostrils daily. 09/08/21   [provider]  furosemide (LASIX) 40 MG tablet Take 1 tablet (40 mg) by mouth once daily 08/11/21   Deboraha Sprang, MD  isosorbide mononitrate (IMDUR) 120 MG 24 hr tablet Take 1 tablet (120 mg) by mouth once daily AT BEDTIME 08/11/21   Deboraha Sprang, MD  isosorbide mononitrate (IMDUR) 30 MG 24 hr tablet Take 2 tablets by mouth 2 (two) times daily. 08/16/21   [provider]  levothyroxine (SYNTHROID) 100 MCG tablet Take 1 tablet (100 mcg total) by mouth daily before breakfast. 08/18/21   Deboraha Sprang, MD  levothyroxine  (SYNTHROID) 75 MCG tablet Take 75 mcg by mouth daily. 08/18/21   [provider]  loratadine (CLARITIN) 10 MG tablet Take 1 tablet (10 mg total) by mouth daily. 03/30/21   Cannady, Henrine Screws T, NP  losartan (COZAAR) 100 MG tablet Take 1 tablet (100 mg total) by mouth daily. 08/11/21 11/09/21  Deboraha Sprang, MD  losartan (COZAAR) 50 MG tablet Take 100 mg by mouth daily. 08/23/21   [provider]  magnesium oxide (MAG-OX) 400 MG tablet Take 1 tablet by mouth at bedtime. 11/15/21   [provider]  Magnesium Oxide 400 MG CAPS Take 1 capsule (400 mg total) by mouth daily. 04/29/21   Dunn, Areta Haber, PA-C  nitroGLYCERIN (NITROSTAT) 0.4 MG SL tablet Place 1 tablet (0.4 mg total) under the tongue every 5 (five) minutes as needed for chest pain (chest  pain). Maximum of 3 doses. 04/11/19   Dunn, Areta Haber, PA-C  pantoprazole (PROTONIX) 40 MG tablet Take 1 tablet (40 mg total) by mouth daily. 07/27/20   Cannady, Jolene T, NP  PATADAY 0.2 % SOLN Apply 1 drop to eye daily. 11/15/21   [provider]  polyethylene glycol (MIRALAX / GLYCOLAX) 17 g packet Take 17 g by mouth daily as needed.    [provider]  rosuvastatin (CRESTOR) 10 MG tablet Take 10 MG (one tablet) by mouth 3 days a week (Monday, Wednesday, Friday). 07/28/20   Cannady, Henrine Screws T, NP  spironolactone (ALDACTONE) 25 MG tablet Take 1 tablet (25 mg total) by mouth daily. 11/01/21 12/01/21  Blake Divine, MD  traZODone (DESYREL) 50 MG tablet Take 50 mg by mouth at bedtime.    [provider]  VITAMIN D-1000 MAX ST 25 MCG (1000 UT) tablet Take 1,000 Units by mouth daily. 03/22/21   [provider]    Family History    Family History  Problem Relation Age of Onset   Cancer Mother        stomach   Heart disease Neg Hx    He indicated that his mother is deceased. He indicated that his father is deceased. He indicated that his sister is alive. He indicated that both of his brothers are alive. He indicated  that his maternal grandmother is deceased. He indicated that his maternal grandfather is deceased. He indicated that his paternal grandmother is deceased. He indicated that his paternal grandfather is deceased. He indicated that all of his three daughters are alive. He indicated that all of his four sons are alive. He indicated that the status of his neg hx is unknown.   Social History    Social History   Socioeconomic History   Marital status: Married    Spouse name: Not on file   Number of children: Not on file   Years of education: Not on file   Highest education level: Not on file  Occupational History   Not on file  Tobacco Use   Smoking status: Never   Smokeless tobacco: Never  Vaping Use   Vaping Use: Never used  Substance and Sexual Activity   Alcohol use: No   Drug use: No   Sexual activity: Not Currently  Other Topics Concern   Not on file  Social History Narrative   Not on file   Social Determinants of Health   Financial Resource Strain: Not on file  Food Insecurity: Not on file  Transportation Needs: Not on file  Physical Activity: Not on file  Stress: Not on file  Social Connections: Not on file  Intimate Partner Violence: Not on file     Review of Systems    General:  No chills, fever, night sweats or weight changes.  Cardiovascular:  +++ chest pain, +++ dyspnea on exertion, +++ intermittent presyncope, no edema, orthopnea, palpitations, paroxysmal nocturnal dyspnea. Dermatological: No rash, lesions/masses Respiratory: No cough, +++ dyspnea Urologic: No hematuria, dysuria Abdominal:   No nausea, vomiting, diarrhea, bright red blood per rectum, melena, or hematemesis Neurologic:  No visual changes, wkns, changes in mental status. All other systems reviewed and are otherwise negative except as noted above.  Physical Exam    Blood pressure 124/75, pulse 70, temperature (!) 97 F (36.1 C), temperature source Axillary, resp. rate 20, height 5\' 6"  (1.676  m), weight 87.5 kg, SpO2 100 %.  General: Pleasant, NAD Psych: Normal affect. Neuro: Alert and oriented X  3. Moves all extremities spontaneously. HEENT: Normal  Neck: Supple without bruits or JVD. Lungs:  Resp regular and unlabored, CTA. Heart: RRR, tachycardic, no s3, s4, or murmurs. Abdomen: Soft, non-tender, non-distended, BS + x 4.  Extremities: No clubbing, cyanosis or edema. PT2+, Radials 2+ and equal bilaterally.  Labs    Cardiac Enzymes Recent Labs  Lab 12/22/21 1123  TROPONINIHS 116*     BNP    Component Value Date/Time   BNP 89.7 11/01/2021 1706   Lab Results  Component Value Date   CREATININE 1.58 (H) 12/22/2021   BUN 22 12/22/2021   NA 133 (L) 12/22/2021   K 3.7 12/22/2021   CL 97 (L) 12/22/2021   CO2 27 12/22/2021    Lab Results  Component Value Date   WBC 5.5 12/22/2021   HGB 13.8 12/22/2021   HCT 40.2 12/22/2021   MCV 92.0 12/22/2021   PLT 284 12/22/2021   Lab Results  Component Value Date   CHOL 140 04/04/2021   HDL 43 04/04/2021   LDLCALC 78 04/04/2021   TRIG 94 04/04/2021    Radiology Studies    DG Chest Portable 1 View  Result Date: 12/22/2021 CLINICAL DATA:  Chest pain with shortness of breath. SVT. Unresponsiveness. EXAM: PORTABLE CHEST 1 VIEW COMPARISON:  Radiographs 04/02/2021 and 09/15/2020.  CT 11/01/2021. FINDINGS: 1242 hours. Persistent low lung volumes. The heart size and mediastinal contours are stable. Bibasilar atelectasis, similar to previous study. No superimposed edema, confluent airspace opacity, pneumothorax or significant pleural effusion. No acute osseous findings are evident. Telemetry leads overlie the chest. IMPRESSION: Persistent low lung volumes and bibasilar atelectasis. No acute cardiopulmonary process. Electronically Signed   By: Richardean Sale M.D.   On: 12/22/2021 13:08   DG Sinuses Complete  Result Date: 12/16/2021 CLINICAL DATA:  Chronic cough EXAM: PARANASAL SINUSES - COMPLETE 3 + VIEW COMPARISON:  None  Available. FINDINGS: The paranasal sinus are aerated. There is no evidence of sinus opacification air-fluid levels or mucosal thickening. No significant bone abnormalities are seen. IMPRESSION: Negative. Electronically Signed   By: Lucienne Capers M.D.   On: 12/16/2021 19:46    ECG & Cardiac Imaging    VT, 150, LAD, RBBB morphology - personally reviewed.  Assessment & Plan    1.  Sustained ventricular tachycardia: Patient with a prior history of sustained ventricular tachycardia and work-up in early 2023 with catheterization revealing mild to moderate nonobstructive disease, echo showing an EF of 55 to 60%, and cardiac MRI showed EF of 46% with greater than 50% thickness subendocardial LGE suggestive of prior infarct in the circumflex distribution and at least moderate mitral regurgitation.  He has been managed with amiodarone 20 mg daily in outpatient setting and had been doing well but over the past 3 days, has been having intermittent presyncope and then subsequently developed chest pressure, diaphoresis, and dyspnea at approximately 1 AM, which persisted throughout the night, prompting him to present to his primary care provider, where he was found to be in a wide-complex tachycardia.  He was treated by EMS with adenosine 6 followed by 12 mg without change in rhythm and subsequently calcium and sodium bicarbonate.  On presentation to the ED, he was in sinus rhythm but subsequently had recurrent wide-complex tachycardia consistent with ventricular tachycardia.  He has received 4 intravenous boluses of amiodarone 150 mg and has also been receiving amiodarone infusion at 60 mg/h with ongoing intermittent VT over prolonged periods of time.  Fortunately, he has been hemodynamically stable and  minimally symptomatic at rest.  Lab work is unremarkable with exception of mild troponin elevation 216.  We have discussed this case with electrophysiology at [facility] and will load with lidocaine 100 mg IV x1 now followed  by infusion at 1 mg/h.  Consider increasing to 2 mg/h for ongoing incessant VT.  2.  Supply demand ischemia/nonobstructive CAD: Diagnostic catheterization in January 2023 with dominant RCA and small LAD with moderate mid-vessel disease.  In the setting of VT with rates into the 150s intermittently since 1 AM, troponin mildly elevated at 116.  He did have chest discomfort overnight but none since arrival.  At this time, no active plans for ischemic evaluation during this hospitalization.  We will resume beta-blocker, Plavix, and statin therapy.  3.  Essential hypertension: Blood pressures have been stable despite VT.  Follow.  4.  Hyperlipidemia: LDL of 78 previously.  Continue statin therapy.  5.  Stage IIIb chronic kidney disease: Creatinine relatively stable at 1.58.  Continue home dose of ARB therapy.  Follow-up.  6.  Moderate mitral regurgitation: At least moderate MR noted on prior MRI in March 2023.  Patient has not been particularly symptomatic at home.  Will need follow-up imaging in the future.  Risk Assessment/Risk Scores:           Signed, Murray Hodgkins, NP 12/22/2021, 1:31 PM  For questions or updates, please contact   Please consult [email or web address] for contact info under Cardiology/STEMI.

## 2021-12-22 NOTE — ED Notes (Signed)
Pt c/o pain at the RAC IV. This RN assessed the area and if flushed and blood return was noted but IV was removed for pt's comfort with tip intact. New IV to the RUA started.

## 2021-12-22 NOTE — ED Notes (Signed)
Dr. Haroldine Laws called back and stated he is going to try to transfer pt to [facility] and he is speaking with [facility] and talking with their ICU about getting a bed for the pt.

## 2021-12-22 NOTE — ED Notes (Signed)
Pt accepted to [facility] per Bertram Millard, coordinator. Call [phone] for report. [facility] to transport

## 2021-12-22 NOTE — ED Notes (Signed)
Called [facility] for update, room still not ready

## 2021-12-22 NOTE — ED Provider Notes (Signed)
[facility] Provider Note    Event Date/Time   First MD Initiated Contact with Patient 12/22/21 1126     (approximate)   History   Chief Complaint: Tachycardia   HPI  Gerald Hodges is a 86 y.o. male with a history of hypothyroidism, CHF, hypertension who was sent to the ED due to tachycardia.  EMS reports that initial room air oxygen saturation was 65% which improved with nonrebreather.  They gave the patient adenosine for suspected SVT without effect, but then gave calcium and bicarb which converted the patient to a sinus rhythm.  Patient reports he was having severe central chest pressure which made him feel like he was going to die, but now that his heart rhythm is better it feels better.  It was associated with shortness of breath, nonradiating.  No diaphoresis or vomiting or exertional symptoms.     Physical Exam   Triage Vital Signs: ED Triage Vitals  Enc Vitals Group     BP 12/22/21 1124 (!) 129/98     Pulse Rate 12/22/21 1124 76     Resp 12/22/21 1124 (!) 22     Temp 12/22/21 1124 (!) 97 F (36.1 C)     Temp Source 12/22/21 1124 Axillary     SpO2 12/22/21 1124 100 %     Weight 12/22/21 1125 192 lb 14.4 oz (87.5 kg)     Height 12/22/21 1125 5\' 6"  (1.676 m)     Head Circumference --      Peak Flow --      Pain Score --      Pain Loc --      Pain Edu? --      Excl. in GC? --     Most recent vital signs: Vitals:   12/22/21 1505 12/22/21 1555  BP: (!) 121/101 104/85  Pulse: (!) 147 (!) 145  Resp: (!) 23 (!) 24  Temp:    SpO2: 99% 90%    General: Awake, moderate distress.  CV:  Good peripheral perfusion.  Regular rate and rhythm Resp:  Normal effort.  Clear to auscultation bilaterally Abd:  No distention.  Soft nontender Other:  No lower extremity edema.  Normal mental status.   ED Results / Procedures / Treatments   Labs (all labs ordered are listed, but only abnormal results are displayed) Labs Reviewed   COMPREHENSIVE METABOLIC PANEL - Abnormal; Notable for the following components:      Result Value   Sodium 133 (*)    Chloride 97 (*)    Glucose, Bld 136 (*)    Creatinine, Ser 1.58 (*)    Total Bilirubin 1.5 (*)    GFR, Estimated 43 (*)    All other components within normal limits  TSH - Abnormal; Notable for the following components:   TSH 8.393 (*)    All other components within normal limits  TROPONIN I (HIGH SENSITIVITY) - Abnormal; Notable for the following components:   Troponin I (High Sensitivity) 116 (*)    All other components within normal limits  TROPONIN I (HIGH SENSITIVITY) - Abnormal; Notable for the following components:   Troponin I (High Sensitivity) 165 (*)    All other components within normal limits  RESP PANEL BY RT-PCR (FLU A&B, COVID) ARPGX2  ETHANOL  CBC WITH DIFFERENTIAL/PLATELET  PROTIME-INR  MAGNESIUM  BRAIN NATRIURETIC PEPTIDE  CBC WITH DIFFERENTIAL/PLATELET     EKG Interpreted by me Sinus rhythm rate of 75.  Normal axis, normal intervals.  Normal  QRS and ST segments.  T wave inversions in inferior and lateral leads.  Repeat EKG at 11:28 AM shows ventricular tachycardia with a rate of 147.  Additional EKGs show patient in and out of sinus rhythm and ventricular tachycardia.   RADIOLOGY Chest x-ray unremarkable.  Radiology report reviewed   PROCEDURES:  .Critical Care  Performed by: Sharman Cheek, MD Authorized by: Sharman Cheek, MD   Critical care provider statement:    Critical care time (minutes):  45   Critical care time was exclusive of:  Separately billable procedures and treating other patients   Critical care was necessary to treat or prevent imminent or life-threatening deterioration of the following conditions:  Cardiac failure   Critical care was time spent personally by me on the following activities:  Development of treatment plan with patient or surrogate, discussions with consultants, evaluation of patient's  response to treatment, examination of patient, obtaining history from patient or surrogate, ordering and performing treatments and interventions, ordering and review of laboratory studies, ordering and review of radiographic studies, pulse oximetry, re-evaluation of patient's condition and review of old charts   Care discussed with: admitting provider   Comments:        .1-3 Lead EKG Interpretation  Performed by: Sharman Cheek, MD Authorized by: Sharman Cheek, MD     Interpretation: abnormal     ECG rate:  150   ECG rate assessment: tachycardic     Rhythm: ventricular tachycardia      MEDICATIONS ORDERED IN ED: Medications  ondansetron (ZOFRAN) injection 4 mg (0 mg Intravenous Hold 12/22/21 1219)  amiodarone (NEXTERONE) 1.8 mg/mL load via infusion 150 mg (150 mg Intravenous Bolus from Bag 12/22/21 1208)    Followed by  amiodarone (NEXTERONE PREMIX) 360-4.14 MG/200ML-% (1.8 mg/mL) IV infusion (0 mg/hr Intravenous Stopped 12/22/21 1526)    Followed by  amiodarone (NEXTERONE PREMIX) 360-4.14 MG/200ML-% (1.8 mg/mL) IV infusion (30 mg/hr Intravenous New Bag/Given 12/22/21 1526)  lidocaine (XYLOCAINE) 4 mg/mL bolus via infusion 100 mg (has no administration in time range)    And  lidocaine (cardiac) 2000 mg in dextrose 5% 500 mL (4mg /mL) IV infusion (has no administration in time range)  amiodarone (NEXTERONE) 150-4.21 MG/100ML-% bolus (0 mg  Stopped 12/22/21 1141)  calcium gluconate inj 10% (1 g) URGENT USE ONLY! (1 g Intravenous Given 12/22/21 1202)  sodium bicarbonate injection 50 mEq (50 mEq Intravenous Given 12/22/21 1201)  amiodarone (NEXTERONE) 1.8 mg/mL load via infusion 150 mg (150 mg Intravenous Bolus from Bag 12/22/21 1303)  amiodarone (NEXTERONE) 1.8 mg/mL load via infusion 150 mg (150 mg Intravenous Bolus from Bag 12/22/21 1545)     IMPRESSION / MDM / ASSESSMENT AND PLAN / ED COURSE  I reviewed the triage vital signs and the nursing notes.                               Differential diagnosis includes, but is not limited to, electrolyte abnormality, AKI, non-STEMI, hyperthyroidism, COVID  Patient's presentation is most consistent with acute presentation with potential threat to life or bodily function.  Patient presents with ventricular tachycardia.  Recurrent despite initial management by EMS.  Patient started with amiodarone boluses and infusion with improvement and conversion back to sinus rhythm, but he had relapsing ventricular tachycardia.  Cardiology was consulted.  Case discussed with hospitalist for further management.       FINAL CLINICAL IMPRESSION(S) / ED DIAGNOSES   Final diagnoses:  Ventricular tachycardia (  [place])     Rx / DC Orders   ED Discharge Orders     None        Note:  This document was prepared using Dragon voice recognition software and may include unintentional dictation errors.   Carrie Mew, MD 12/22/21 1640

## 2021-12-22 NOTE — ED Notes (Signed)
Gerald Hodges [phone]

## 2021-12-23 ENCOUNTER — Inpatient Hospital Stay (HOSPITAL_COMMUNITY)
Admission: AD | Admit: 2021-12-23 | Discharge: 2021-12-27 | DRG: 277 | Disposition: A | Discharge status: Home Health | Payer: Medicare (Managed Care) | Source: Other Acute Inpatient Hospital | Attending: Cardiology | Admitting: Cardiology

## 2021-12-23 ENCOUNTER — Telehealth: Payer: Self-pay | Admitting: Internal Medicine

## 2021-12-23 ENCOUNTER — Ambulatory Visit: Payer: Medicaid Other | Attending: Internal Medicine | Admitting: Internal Medicine

## 2021-12-23 ENCOUNTER — Encounter (HOSPITAL_COMMUNITY): Payer: Self-pay | Admitting: Cardiology

## 2021-12-23 DIAGNOSIS — N179 Acute kidney failure, unspecified: Secondary | ICD-10-CM | POA: Diagnosis present

## 2021-12-23 DIAGNOSIS — I2510 Atherosclerotic heart disease of native coronary artery without angina pectoris: Secondary | ICD-10-CM | POA: Diagnosis present

## 2021-12-23 DIAGNOSIS — E785 Hyperlipidemia, unspecified: Secondary | ICD-10-CM | POA: Diagnosis present

## 2021-12-23 DIAGNOSIS — Z800 Family history of malignant neoplasm of digestive organs: Secondary | ICD-10-CM

## 2021-12-23 DIAGNOSIS — N183 Chronic kidney disease, stage 3 unspecified: Secondary | ICD-10-CM | POA: Diagnosis present

## 2021-12-23 DIAGNOSIS — Z7902 Long term (current) use of antithrombotics/antiplatelets: Secondary | ICD-10-CM | POA: Diagnosis not present

## 2021-12-23 DIAGNOSIS — Z79899 Other long term (current) drug therapy: Secondary | ICD-10-CM

## 2021-12-23 DIAGNOSIS — Z8673 Personal history of transient ischemic attack (TIA), and cerebral infarction without residual deficits: Secondary | ICD-10-CM

## 2021-12-23 DIAGNOSIS — I5022 Chronic systolic (congestive) heart failure: Secondary | ICD-10-CM | POA: Diagnosis present

## 2021-12-23 DIAGNOSIS — I130 Hypertensive heart and chronic kidney disease with heart failure and stage 1 through stage 4 chronic kidney disease, or unspecified chronic kidney disease: Secondary | ICD-10-CM | POA: Diagnosis present

## 2021-12-23 DIAGNOSIS — Z96652 Presence of left artificial knee joint: Secondary | ICD-10-CM | POA: Diagnosis present

## 2021-12-23 DIAGNOSIS — I428 Other cardiomyopathies: Secondary | ICD-10-CM | POA: Diagnosis present

## 2021-12-23 DIAGNOSIS — R7989 Other specified abnormal findings of blood chemistry: Secondary | ICD-10-CM | POA: Diagnosis not present

## 2021-12-23 DIAGNOSIS — K219 Gastro-esophageal reflux disease without esophagitis: Secondary | ICD-10-CM | POA: Diagnosis present

## 2021-12-23 DIAGNOSIS — I472 Ventricular tachycardia, unspecified: Secondary | ICD-10-CM | POA: Diagnosis present

## 2021-12-23 DIAGNOSIS — E039 Hypothyroidism, unspecified: Secondary | ICD-10-CM | POA: Diagnosis present

## 2021-12-23 DIAGNOSIS — Z79890 Hormone replacement therapy (postmenopausal): Secondary | ICD-10-CM

## 2021-12-23 DIAGNOSIS — Z8616 Personal history of COVID-19: Secondary | ICD-10-CM | POA: Diagnosis not present

## 2021-12-23 LAB — COMPREHENSIVE METABOLIC PANEL
ALT: 35 U/L (ref 0–44)
AST: 32 U/L (ref 15–41)
Albumin: 3.3 g/dL — ABNORMAL LOW (ref 3.5–5.0)
Alkaline Phosphatase: 56 U/L (ref 38–126)
Anion gap: 10 (ref 5–15)
BUN: 17 mg/dL (ref 8–23)
CO2: 25 mmol/L (ref 22–32)
Calcium: 9.2 mg/dL (ref 8.9–10.3)
Chloride: 99 mmol/L (ref 98–111)
Creatinine, Ser: 1.53 mg/dL — ABNORMAL HIGH (ref 0.61–1.24)
GFR, Estimated: 45 mL/min — ABNORMAL LOW (ref >60)
Glucose, Bld: 120 mg/dL — ABNORMAL HIGH (ref 70–99)
Potassium: 4 mmol/L (ref 3.5–5.1)
Sodium: 134 mmol/L — ABNORMAL LOW (ref 135–145)
Total Bilirubin: 1.1 mg/dL (ref 0.3–1.2)
Total Protein: 6 g/dL — ABNORMAL LOW (ref 6.5–8.1)

## 2021-12-23 LAB — CBC
HCT: 43.4 % (ref 39.0–52.0)
Hemoglobin: 15.4 g/dL (ref 13.0–17.0)
MCH: 32.2 pg (ref 26.0–34.0)
MCHC: 35.5 g/dL (ref 30.0–36.0)
MCV: 90.6 fL (ref 80.0–100.0)
Platelets: 270 10*3/uL (ref 150–400)
RBC: 4.79 MIL/uL (ref 4.22–5.81)
RDW: 13.3 % (ref 11.5–15.5)
WBC: 8.9 10*3/uL (ref 4.0–10.5)
nRBC: 0 % (ref 0.0–0.2)

## 2021-12-23 LAB — T4, FREE: Free T4: 1.28 ng/dL — ABNORMAL HIGH (ref 0.61–1.12)

## 2021-12-23 LAB — MRSA NEXT GEN BY PCR, NASAL: MRSA by PCR Next Gen: NOT DETECTED

## 2021-12-23 LAB — PROTIME-INR
INR: 1.2 (ref 0.8–1.2)
Prothrombin Time: 14.6 seconds (ref 11.4–15.2)

## 2021-12-23 MED ORDER — ENOXAPARIN SODIUM 30 MG/0.3ML IJ SOSY
30.0000 mg | PREFILLED_SYRINGE | INTRAMUSCULAR | Status: DC
  Administered 2021-12-23 – 2021-12-25 (×3): 30 mg via SUBCUTANEOUS
  Filled 2021-12-23 (×3): qty 0.3

## 2021-12-23 MED ORDER — CARVEDILOL 3.125 MG PO TABS
3.1250 mg | ORAL_TABLET | Freq: Two times a day (BID) | ORAL | Status: DC
  Administered 2021-12-23 – 2021-12-27 (×9): 3.125 mg via ORAL
  Filled 2021-12-23 (×9): qty 1

## 2021-12-23 MED ORDER — FUROSEMIDE 20 MG PO TABS
20.0000 mg | ORAL_TABLET | Freq: Every day | ORAL | Status: DC
  Administered 2021-12-23 – 2021-12-27 (×5): 20 mg via ORAL
  Filled 2021-12-23 (×5): qty 1

## 2021-12-23 MED ORDER — ALLOPURINOL 100 MG PO TABS
100.0000 mg | ORAL_TABLET | Freq: Every day | ORAL | Status: DC
  Administered 2021-12-23 – 2021-12-27 (×5): 100 mg via ORAL
  Filled 2021-12-23 (×5): qty 1

## 2021-12-23 MED ORDER — GUAIFENESIN 100 MG/5ML PO LIQD
5.0000 mL | ORAL | Status: DC | PRN
  Administered 2021-12-23 – 2021-12-24 (×2): 5 mL via ORAL
  Filled 2021-12-23 (×2): qty 5

## 2021-12-23 MED ORDER — ALBUTEROL SULFATE HFA 108 (90 BASE) MCG/ACT IN AERS: 2.0000 | INHALATION_SPRAY | Freq: Four times a day (QID) | RESPIRATORY_TRACT | Status: DC | PRN

## 2021-12-23 MED ORDER — AMIODARONE HCL IN DEXTROSE 360-4.14 MG/200ML-% IV SOLN: 30.0000 mg/h | INTRAVENOUS | Status: AC

## 2021-12-23 MED ORDER — DOCUSATE SODIUM 100 MG PO CAPS
100.0000 mg | ORAL_CAPSULE | Freq: Every day | ORAL | Status: DC
  Administered 2021-12-23 – 2021-12-27 (×5): 100 mg via ORAL
  Filled 2021-12-23 (×5): qty 1

## 2021-12-23 MED ORDER — ACETAMINOPHEN 325 MG PO TABS
650.0000 mg | ORAL_TABLET | ORAL | Status: DC | PRN
  Administered 2021-12-25 – 2021-12-27 (×2): 650 mg via ORAL
  Filled 2021-12-23 (×2): qty 2

## 2021-12-23 MED ORDER — AMIODARONE HCL IN DEXTROSE 360-4.14 MG/200ML-% IV SOLN
30.0000 mg/h | INTRAVENOUS | Status: DC
  Administered 2021-12-23 – 2021-12-25 (×5): 30 mg/h via INTRAVENOUS
  Filled 2021-12-23 (×4): qty 200

## 2021-12-23 MED ORDER — LEVOTHYROXINE SODIUM 100 MCG PO TABS
100.0000 ug | ORAL_TABLET | Freq: Every day | ORAL | Status: DC
  Administered 2021-12-24 – 2021-12-27 (×4): 100 ug via ORAL
  Filled 2021-12-23 (×4): qty 1

## 2021-12-23 MED ORDER — ROSUVASTATIN CALCIUM 5 MG PO TABS
10.0000 mg | ORAL_TABLET | ORAL | Status: DC
  Administered 2021-12-23 – 2021-12-27 (×3): 10 mg via ORAL
  Filled 2021-12-23 (×4): qty 2

## 2021-12-23 MED ORDER — LIDOCAINE IN D5W 4-5 MG/ML-% IV SOLN
1.0000 mg/min | INTRAVENOUS | Status: DC
  Administered 2021-12-23 (×2): 1 mg/min via INTRAVENOUS
  Filled 2021-12-23: qty 500

## 2021-12-23 MED ORDER — CHLORHEXIDINE GLUCONATE CLOTH 2 % EX PADS
6.0000 | MEDICATED_PAD | Freq: Every day | CUTANEOUS | Status: DC
  Administered 2021-12-23 – 2021-12-27 (×3): 6 via TOPICAL

## 2021-12-23 MED ORDER — ONDANSETRON HCL 4 MG/2ML IJ SOLN: 4.0000 mg | Freq: Four times a day (QID) | INTRAMUSCULAR | Status: DC | PRN

## 2021-12-23 MED ORDER — TRAZODONE HCL 50 MG PO TABS
50.0000 mg | ORAL_TABLET | Freq: Every day | ORAL | Status: DC
  Administered 2021-12-23 – 2021-12-26 (×4): 50 mg via ORAL
  Filled 2021-12-23 (×4): qty 1

## 2021-12-23 MED ORDER — MAGNESIUM OXIDE -MG SUPPLEMENT 400 (240 MG) MG PO TABS
400.0000 mg | ORAL_TABLET | Freq: Every day | ORAL | Status: DC
  Administered 2021-12-23 – 2021-12-26 (×4): 400 mg via ORAL
  Filled 2021-12-23 (×6): qty 1

## 2021-12-23 NOTE — Progress Notes (Addendum)
1945 patient in bed sleeping family at bedside plan per MD noted to keep amio and lidocaine running for an additional 24 hours. Plan to have AICD placed before discharge. Patient family at bedside states patient does have 1 hearing aide but not in hospital and it doesn't help improve hearing so he doesn't wear it at home. Patient unable to hear translator on stick so google translate was used to help communicate with patient. Wife at bedside staying with patient tonight also non-english speaking.

## 2021-12-23 NOTE — Plan of Care (Signed)

## 2021-12-23 NOTE — Telephone Encounter (Signed)
Spoke with Dr Caryl Comes and advised pt is in ICU.  Provided [employer] number as listed and requested Dr Caryl Comes contact.

## 2021-12-23 NOTE — Consult Note (Addendum)
Cardiology Consultation   Patient ID: Gerald Hodges MRN: [id number]; DOB: 07-26-1935  Admit date: 12/23/2021 Date of Consult: 12/23/2021  PCP:  Dionicia Abler, NP (Inactive)   [place] Providers Cardiologist:  Nelva Bush, MD  EP: Dr. Caryl Comes    Patient Profile:   Gerald Hodges is a 86 y.o. male with a hx of TIA, CKD (III), HLD, GERD, HTN, VT, no obstructive CAD (Cath Jan 2023),  who is being seen 12/23/2021 for the evaluation of recurrent VT at the request of Dr. Haroldine Laws.  History of Present Illness:   Gerald Hodges was admitted to [facility] back in Jan of this year with CP, diaphoresis, found in VT cardioverted in the ER, w/u then noted preserved LVEF 55-60%, LHC with no obstructive disease.  Dr. Caryl Comes consulted  with plans to continue with amiodarone as management strategy and follow up with outpt follow up, MRI.  MRI noted LVEF 46%, greater than 50% thickness subendocardial LGE suggestive of prior infarct in the circumflex distribution however, it was noted that diagnostic catheterization did not show any significant circumflex disease, and he has been medically managed.  He saw Dr. Caryl Comes in June, no symptoms/palpitations, tolerating amiodarone, volume OL in setting of significant water/Na++ intake and counselled, his lasix was pulsed a couple days, amio labs planned. Discussed plans to see Dr. Saunders Revel with significant left ventricular dysfunction in the context of his MR  BP was high, bradycardia would not allow more beta blocker, in fact suspected likely would have to reduce his coreg dose in the future,  and ARNB increased with plans to further advance GDMT at his next visit.  TSH was 11 > synthroid increased  He was admitted to [facility] yesterday with symptoms, noted to have VT again.  He reported a few days of dizzy spells, perhaps near syncope, but no other symptoms.  The AM coming in he woke with chest pressure, diaphoresis, tokk a couple  doses of his PRN NTG without improvement.  He saw his PMD in the AM, found him in a WCT rhythm and called EMS.  He was hypoxic in the 60's% > NRB mask, initial treatment was adenosine  6>12 without change >  calcium and sodium bicarbonate with conversion to sinus rhythm. He started on amio gtt and received a few amio boluses with recurrent VT.  Cardiology saw him there mild HS Trop 116, no plans for repeat ischemic eval.  With recurrent sustained VT (though minimally symptomatic at rest) in d/w EP (Dr. Quentin Ore) lidocaine added And planned for transfer to [facility] for further management.  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 b. TTE 12/17/2019: EF 55-60%; c. 03/2021 Echo: EF 55-60%, GrI DD, mod MR; d. 05/2021 cMRI: LVEF 46%, RVEF 46%, >50% thickness subendocardial LGE sugg of prior infarct in LCX distribution. At least mod MR.   Abdominal aortic atherosclerosis ([place])    a. MPI 12/12/2019--> very mild.   Anginal pain (HCC)    CKD (chronic kidney disease), stage III (HCC)    GERD (gastroesophageal reflux disease)  Gout    History of 2019 novel coronavirus disease (COVID-19) 09/10/2020   Hyperlipidemia    Hypertension    Hypothyroidism    Moderate mitral regurgitation    a. 03/2021 Echo: Mod MR; b. 05/2021 cMRI: at least mod MR.   Nonosbructive CAD (coronary artery disease)    a. 03/2021 Cath: LM nl, LAD 52m, D1 nl, LCX min irregs, RCA 20p/d, RPDA nl, RPAV min irregs, RPL1/2/3 nl.   Osteoarthritis of left knee    Psoriasis    Recurrent umbilical hernia    a. repaired 01/01/2020; recurred.   SNHL  (sensory-neural hearing loss), asymmetrical    Sustained ventricular tachycardia ([place]) 04/02/2021   TIA (transient ischemic attack)     Past Surgical History:  Procedure Laterality Date   BUNIONECTOMY Bilateral    CATARACT EXTRACTION W/ INTRAOCULAR LENS  IMPLANT, BILATERAL Bilateral    LEFT HEART CATH AND CORONARY ANGIOGRAPHY N/A 04/04/2021   Procedure: LEFT HEART CATH AND CORONARY ANGIOGRAPHY;  Surgeon: Wellington Hampshire, MD;  Location: [place] [facility];  Service: Cardiovascular;  Laterality: N/A;   TOTAL KNEE ARTHROPLASTY Left 10/25/2016   Procedure: TOTAL KNEE ARTHROPLASTY;  Surgeon: Earnestine Leys, MD;  Location: [facility];  Service: Orthopedics;  Laterality: Left;   UMBILICAL HERNIA REPAIR N/A 01/01/2020   Procedure: HERNIA REPAIR UMBILICAL ADULT;  Surgeon: Olean Ree, MD;  Location: [facility] OR[facility];  Service: General;  Laterality: N/A;   XI ROBOTIC ASSISTED INGUINAL HERNIA REPAIR WITH MESH Left 01/01/2020   Procedure: XI ROBOTIC ASSISTED INGUINAL HERNIA REPAIR WITH MESH;  Surgeon: Olean Ree, MD;  Location: [facility] OR[facility];  Service: General;  Laterality: Left;     Home Medications:  Prior to Admission medications   Medication Sig Start Date End Date Taking? Authorizing Provider  acetaminophen (TYLENOL) 500 MG tablet Take 2 tablets (1,000 mg total) by mouth every 6 (six) hours as needed for mild pain. 12/14/20   Piscoya, Jacqulyn Bath, MD  acetic acid 2 % otic solution SMARTSIG:In Ear(s) 09/20/21   [provider]  albuterol (VENTOLIN HFA) 108 (90 Base) MCG/ACT inhaler Inhale 2 puffs into the lungs every 6 (six) hours as needed for wheezing or shortness of breath. 11/01/21   Blake Divine, MD  allopurinol (ZYLOPRIM) 100 MG tablet TAKE 1 TABLET(100 MG) BY MOUTH DAILY 07/27/20   Cannady, Henrine Screws T, NP  amiodarone (PACERONE) 200 MG tablet Take 200 mg by mouth daily.    [provider]  amLODipine (NORVASC) 5 MG tablet Take 5 mg by mouth daily. 12/11/21   [provider]   azelastine (ASTELIN) 0.1 % nasal spray Place 1 spray into both nostrils daily. 11/15/21   [provider]  benzonatate (TESSALON) 100 MG capsule Take 100 mg by mouth 3 (three) times daily. 12/16/21   [provider]  calcium carbonate (TUMS - DOSED IN MG ELEMENTAL CALCIUM) 500 MG chewable tablet Chew 1 tablet by mouth 2 (two) times daily as needed for indigestion or heartburn.    [provider]  carvedilol (COREG) 3.125 MG tablet Take 3.125 mg by mouth 2 (two) times daily. 11/15/21   [provider]  carvedilol (COREG) 6.25 MG tablet TAKE 1 TABLET(6.25 MG) BY MOUTH TWICE DAILY WITH A MEAL 01/27/21   Cannady, Jolene T, NP  chlorpheniramine-HYDROcodone (TUSSIONEX) 10-8 MG/5ML Take 5 mLs by mouth at bedtime as needed. 12/06/21   [provider]  clopidogrel (PLAVIX) 75 MG tablet Take 1 tablet (75 mg total) by mouth daily. 07/27/20   Venita Lick, NP  [employer] CORTISONE MAXIMUM STRENGTH 1 % lotion SMARTSIG:1 sparingly Topical Twice Daily 09/27/21   [provider]  docusate sodium (COLACE) 100 MG capsule Take 100 mg by mouth daily.    [provider]  Emollient (AQUAPHOR ADVANCED THERAPY BABY) OINT SMARTSIG:sparingly Topical Twice Daily 09/27/21   [provider]  fluticasone (FLONASE) 50 MCG/ACT nasal spray Place 1 spray into both nostrils daily. 09/08/21   [provider]  furosemide (LASIX) 40 MG tablet Take 1 tablet (40 mg) by mouth once daily 08/11/21   Deboraha Sprang, MD  isosorbide mononitrate (IMDUR) 120 MG 24 hr tablet Take 1 tablet (120 mg) by mouth once daily AT BEDTIME 08/11/21   Deboraha Sprang, MD  isosorbide mononitrate (IMDUR) 30 MG 24 hr tablet Take 2 tablets by mouth 2 (two) times daily. 08/16/21   [provider]  levothyroxine (SYNTHROID) 100 MCG tablet Take 1 tablet (100 mcg total) by mouth daily before breakfast. 08/18/21   Deboraha Sprang, MD  levothyroxine (SYNTHROID) 75 MCG tablet Take 50 mcg by mouth  daily. 08/18/21   [provider]  loratadine (CLARITIN) 10 MG tablet Take 1 tablet (10 mg total) by mouth daily. 03/30/21   Cannady, Henrine Screws T, NP  losartan (COZAAR) 100 MG tablet Take 1 tablet (100 mg total) by mouth daily. 08/11/21 11/09/21  Deboraha Sprang, MD  losartan (COZAAR) 50 MG tablet Take 100 mg by mouth daily. 08/23/21   [provider]  magnesium oxide (MAG-OX) 400 MG tablet Take 1 tablet by mouth at bedtime. 11/15/21   [provider]  Magnesium Oxide 400 MG CAPS Take 1 capsule (400 mg total) by mouth daily. 04/29/21   Dunn, Areta Haber, PA-C  nitroGLYCERIN (NITROSTAT) 0.4 MG SL tablet Place 1 tablet (0.4 mg total) under the tongue every 5 (five) minutes as needed for chest pain (chest pain). Maximum of 3 doses. 04/11/19   Dunn, Areta Haber, PA-C  pantoprazole (PROTONIX) 40 MG tablet Take 1 tablet (40 mg total) by mouth daily. 07/27/20   Cannady, Jolene T, NP  PATADAY 0.2 % SOLN Apply 1 drop to eye daily. 11/15/21   [provider]  polyethylene glycol (MIRALAX / GLYCOLAX) 17 g packet Take 17 g by mouth daily as needed.    [provider]  rosuvastatin (CRESTOR) 10 MG tablet Take 10 MG (one tablet) by mouth 3 days a week (Monday, Wednesday, Friday). 07/28/20   Cannady, Henrine Screws T, NP  spironolactone (ALDACTONE) 25 MG tablet Take 1 tablet (25 mg total) by mouth daily. 11/01/21 12/01/21  Blake Divine, MD  traZODone (DESYREL) 50 MG tablet Take 50 mg by mouth at bedtime.    [provider]  VITAMIN D-1000 MAX ST 25 MCG (1000 UT) tablet Take 1,000 Units by mouth daily. 03/22/21   [provider]    Inpatient Medications: Scheduled Meds:  Chlorhexidine Gluconate Cloth  6 each Topical Q0600   enoxaparin (LOVENOX) injection  30 mg Subcutaneous Q24H   Continuous Infusions:  amiodarone 30 mg/hr (12/23/21 0730)   lidocaine 1 mg/min (12/23/21 0800)   PRN Meds: acetaminophen, guaiFENesin, ondansetron (ZOFRAN) IV  Allergies:    Allergies  Allergen  Reactions   Aspirin Cough   Lisinopril Itching    Throat itching     Social History:   Social History   Socioeconomic History   Marital status: Married    Spouse name: Not on file   Number of children: Not on file   Years of education: Not on  file   Highest education level: Not on file  Occupational History   Not on file  Tobacco Use   Smoking status: Never   Smokeless tobacco: Never  Vaping Use   Vaping Use: Never used  Substance and Sexual Activity   Alcohol use: No   Drug use: No   Sexual activity: Not Currently  Other Topics Concern   Not on file  Social History Narrative   Not on file   Social Determinants of Health   Financial Resource Strain: Not on file  Food Insecurity: Not on file  Transportation Needs: Not on file  Physical Activity: Not on file  Stress: Not on file  Social Connections: Not on file  Intimate Partner Violence: Not on file    Family History:   Family History  Problem Relation Age of Onset   Cancer Mother        stomach   Heart disease Neg Hx      ROS:  Please see the history of present illness.  All other ROS reviewed and negative.     Physical Exam/Data:   Vitals:   12/23/21 0500 12/23/21 0600 12/23/21 0731 12/23/21 0800  BP: 128/74 124/73  117/84  Pulse: 76 76  76  Resp: (!) 32 (!) 34  (!) 27  Temp:   98.3 F (36.8 C)   TempSrc:   Oral   SpO2: 93% 92%  93%  Weight:      Height:        Intake/Output Summary (Last 24 hours) at 12/23/2021 0857 Last data filed at 12/23/2021 0800 Gross per 24 hour  Intake 211.07 ml  Output 375 ml  Net -163.93 ml       12/23/2021   12:40 AM 12/22/2021   11:25 AM 08/11/2021    9:48 AM  Last 3 Weights  Weight (lbs) 179 lb 10.8 oz 192 lb 14.4 oz 188 lb  Weight (kg) 81.5 kg 87.5 kg 85.276 kg     Body mass index is 29 kg/m.  General:  Well nourished, well developed, in no acute distress HEENT: normal Neck: no JVD Vascular: No carotid bruits; Distal pulses 2+  bilaterally Cardiac: RRR; no murmurs, gallops or rubs Lungs:  CTA b/l, no wheezing, rhonchi or rales  Abd: soft, nontender, no hepatomegaly  Ext: no edema Musculoskeletal:  No deformities Skin: warm and dry  Neuro: hard of hearing, otherwise no focal abnormalities noted Psych:  Normal affect   EKG:  The EKG was personally reviewed and demonstrates:    SB 59bp, 1st degree AVblock, , QRS 82, poor/no R progression VT 150bpm, RBBB, LAD VT rates 147, 150, 142  Telemetry:  Telemetry was personally reviewed and demonstrates:   SR 70's  Relevant CV Studies:   06/01/21: c.MRI IMPRESSION: 1. Normal LV size with EF 46%. Wall motion abnormalities as noted above. 2.  Normal RV size and systolic function, EF 46%. 3. LGE pattern is most suggestive of prior infarction in the left circumflex territory (coronary-pattern LGE). With > 50% wall thickness subendocardial LGE in the affected segments, they are unlikely to improve in function with revascularization (unlikely to be viable). Interestingly, no definite explanatory lesion was found at recent catheterization. 4.  Mildly elevated ECV percentage, this is likely nonspecific. 5. At last moderate MR. Unfortunately, flow sequences were not done to quantify. This may be infarct-related MR due to inferolateral/inferior wall motion abnormalities.   VT likely originates from area of scarring in the basal lateral, basal inferior LV.  04/04/21: LHC   Prox RCA lesion is 20% stenosed.   Dist RCA lesion is 20% stenosed.   Mid LAD lesion is 40% stenosed.   1.  Superdominant right coronary artery with relatively small LAD.  The LAD has moderate mid stenosis.  Overall, no evidence of obstructive coronary artery disease. 2.  Left ventricular angiography was not performed due to chronic kidney disease.  EF was normal by echo. 3.  Mildly to moderately elevated left ventricular end-diastolic pressure at 22 mmHg.   Recommendations: no obstructive  coronary artery disease to explain ventricular tachycardia.  Elevated troponin is likely due to supply demand ischemia in the setting of sustained ventricular tachycardia requiring cardioversion. I consulted EP for management of this patient.  04/03/21: TTE 1. Left ventricular ejection fraction, by estimation, is 55 to 60%. The  left ventricle has normal function. The left ventricle has no regional  wall motion abnormalities. There is mild left ventricular hypertrophy.  Left ventricular diastolic parameters  are consistent with Grade I diastolic dysfunction (impaired relaxation).   2. Right ventricular systolic function is normal. The right ventricular  size is normal. There is mildly elevated pulmonary artery systolic  pressure. The estimated right ventricular systolic pressure is 99991111 mmHg.   3. Left atrial size was mildly dilated.   4. The mitral valve is normal in structure. Moderate mitral valve  regurgitation. No evidence of mitral stenosis.   5. Tricuspid valve regurgitation is mild to moderate.   6. The aortic valve was not well visualized. Aortic valve regurgitation  is mild. No aortic stenosis is present.   7. The inferior vena cava is normal in size with greater than 50%  respiratory variability, suggesting right atrial pressure of 3 mmHg.    Laboratory Data:  High Sensitivity Troponin:   Recent Labs  Lab 12/22/21 1123 12/22/21 1330  TROPONINIHS 116* 165*      Chemistry Recent Labs  Lab 12/22/21 1330 12/23/21 0513  NA 133* 134*  K 3.7 4.0  CL 97* 99  CO2 27 25  GLUCOSE 136* 120*  BUN 22 17  CREATININE 1.58* 1.53*  CALCIUM 9.9 9.2  MG 2.1  --   GFRNONAA 43* 45*  ANIONGAP 9 10     Recent Labs  Lab 12/22/21 1330 12/23/21 0513  PROT 6.5 6.0*  ALBUMIN 3.7 3.3*  AST 40 32  ALT 43 35  ALKPHOS 57 56  BILITOT 1.5* 1.1    Lipids No results for input(s): "CHOL", "TRIG", "HDL", "LABVLDL", "LDLCALC", "CHOLHDL" in the last 168 hours.  Hematology Recent Labs   Lab 12/22/21 1123 12/23/21 0513  WBC 5.5 8.9  RBC 4.37 4.79  HGB 13.8 15.4  HCT 40.2 43.4  MCV 92.0 90.6  MCH 31.6 32.2  MCHC 34.3 35.5  RDW 13.5 13.3  PLT 284 270    Thyroid  Recent Labs  Lab 12/22/21 1330 12/23/21 0513  TSH 8.393*  --   FREET4  --  1.28*     BNPNo results for input(s): "BNP", "PROBNP" in the last 168 hours.  DDimer No results for input(s): "DDIMER" in the last 168 hours.   Radiology/Studies:  DG Chest Portable 1 View  Result Date: 12/22/2021 CLINICAL DATA:  Chest pain with shortness of breath. SVT. Unresponsiveness. EXAM: PORTABLE CHEST 1 VIEW COMPARISON:  Radiographs 04/02/2021 and 09/15/2020.  CT 11/01/2021. FINDINGS: 1242 hours. Persistent low lung volumes. The heart size and mediastinal contours are stable. Bibasilar atelectasis, similar to previous study. No superimposed edema, confluent airspace opacity, pneumothorax  or significant pleural effusion. No acute osseous findings are evident. Telemetry leads overlie the chest. IMPRESSION: Persistent low lung volumes and bibasilar atelectasis. No acute cardiopulmonary process. Electronically Signed   By: Richardean Sale M.D.   On: 12/22/2021 13:08     Assessment and Plan:   VT Symptomatic, relatively hemodynamically stable Chronically on amiodarone > gtt Lidocaine gtt   Felt to be scar mediated No obstructive CAD  Get lidocaine level  Once rhythm is settled, plan for ICD Will be next week   NICM Not felt volume OL PO lasix  HTN Will resume low dose coreg Follow off other meds for now  MR Pending updated echo  Hypothyroid Continue synthroid, will need adjustment though better then in June   Risk Assessment/Risk Scores:     For questions or updates, please contact [place] Please consult [email or web address] for contact info under    Signed, Baldwin [place], PA-C  12/23/2021 8:57 AM

## 2021-12-23 NOTE — Progress Notes (Addendum)
0030 patient came from [facility] via [place] patient only speaks spanish and is hard of hearing. RN on unit helped to translate for bedside RN and patient. Patient alert and understands why he has come to the hospital able to answer basic questions.Lidocaine 2gram per 500 ml running at 1mg  per minute and Amiodarone 360mg  in 200 ml running at 30mg  per hour. Orders to continue with drips at same dosage from covering cards group. Patient connected to bedside monitor and bedside telemetry. No recorded urine noted bladder scanned 198ml patient reports he did urinate at [facility] while in ED.   12/23/2021 0630 patient stable over night no arrhythmias noted, patient able to successfully used urinal

## 2021-12-23 NOTE — Telephone Encounter (Signed)
Patient is in the ICU at hospital the the PCP there wanted to talk to Dr. Caryl Comes. Please advise

## 2021-12-23 NOTE — Plan of Care (Signed)
  Problem: Education: Goal: Knowledge of General Education information will improve Description: Including pain rating scale, medication(s)/side effects and non-pharmacologic comfort measures Outcome: Progressing   Problem: Health Behavior/Discharge Planning: Goal: Ability to manage health-related needs will improve Outcome: Progressing   Problem: Clinical Measurements: Goal: Ability to maintain clinical measurements within normal limits will improve Outcome: Progressing Goal: Will remain free from infection Outcome: Not Progressing Goal: Diagnostic test results will improve Outcome: Not Progressing Goal: Respiratory complications will improve Outcome: Not Progressing Goal: Cardiovascular complication will be avoided Outcome: Not Progressing  Patient able to move self in bed on critical gtts for critical arrhythmia patient unable to eat or progress with activity at this time

## 2021-12-23 NOTE — H&P (Signed)
Cardiology Admission History and Physical   Patient ID: Gerald Hodges MRN: [id number]; DOB: [birth year]   Admission date: 12/23/2021  PCP:  Dionicia Abler, NP (Inactive)   [facility] Cardiologist:  Nelva Bush, MD       Chief Complaint:  refractory VT  Patient Profile:   Alecsander Hattabaugh is an 86yo male with medical prob as below who presents as transfer from [facility] for refractory VT  History of Present Illness:   Mr. Serviss s a 86 y.o. male with nonobstructive coronary disease, sustained VT, hypertension, hyperlipidemia, moderate MR normal ejection fraction by echocardiogram January 2023 who presents as transfer from [place] with several days of feeling unwell, chest palpitations, shortness of breath, worsening overnight not relieved with sublingual nitro. As symptoms persisted all night he presented to the emergency room via primary care physician office EMS was contacted for wide-complex tachycardia In the [place] ER he had wide-complex tachyarrhythmia, hypoxia saturation 65% on room air Initially adenosine 6/12 given with no effect, given calcium, sodium bicarbonate, converted to normal sinus rhythm Recurrent wide-complex tachycardia consistent with VT, started on amiodarone bolus with infusion Throughout the afternoon with recurrent VT episodes requiring recurrent amiodarone bolus and continued infusion. Lidocaine IV gtt added for refractory VT, and decision made to transfer pt to [facility] for further evaluation by EP.   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 b. TTE 12/17/2019: EF 55-60%; c. 03/2021 Echo: EF 55-60%, GrI DD, mod MR; d. 05/2021 cMRI: LVEF 46%, RVEF 46%, >50% thickness subendocardial LGE sugg of prior infarct in LCX distribution. At least mod MR.   Abdominal aortic atherosclerosis ([place])    a. MPI 12/12/2019--> very mild.    Anginal pain ([place])    CKD (chronic kidney disease), stage III (HCC)    GERD (gastroesophageal reflux disease)    Gout    History of 2019 novel coronavirus disease (COVID-19) 09/10/2020   Hyperlipidemia    Hypertension    Hypothyroidism    Moderate mitral regurgitation    a. 03/2021 Echo: Mod MR; b. 05/2021 cMRI: at least mod MR.   Nonosbructive CAD (coronary artery disease)    a. 03/2021 Cath: LM nl, LAD 30m, D1 nl, LCX min irregs, RCA 20p/d, RPDA nl, RPAV min irregs, RPL1/2/3 nl.   Osteoarthritis of left knee    Psoriasis    Recurrent umbilical hernia    a. repaired 01/01/2020; recurred.   SNHL (sensory-neural hearing loss), asymmetrical    Sustained ventricular tachycardia ([place]) 04/02/2021   TIA (transient ischemic attack)     Past Surgical History:  Procedure Laterality Date   BUNIONECTOMY Bilateral    CATARACT EXTRACTION W/ INTRAOCULAR LENS  IMPLANT, BILATERAL Bilateral    LEFT HEART CATH AND CORONARY ANGIOGRAPHY N/A 04/04/2021   Procedure: LEFT HEART CATH AND CORONARY ANGIOGRAPHY;  Surgeon: Wellington Hampshire, MD;  Location: [facility];  Service: Cardiovascular;  Laterality: N/A;   TOTAL KNEE ARTHROPLASTY Left 10/25/2016   Procedure: TOTAL KNEE ARTHROPLASTY;  Surgeon: Earnestine Leys, MD;  Location: [facility];  Service: Orthopedics;  Laterality: Left;   UMBILICAL HERNIA REPAIR N/A 01/01/2020   Procedure: HERNIA REPAIR UMBILICAL ADULT;  Surgeon: Olean Ree, MD;  Location: [facility] OR[facility];  Service: General;  Laterality: N/A;   XI ROBOTIC ASSISTED INGUINAL HERNIA REPAIR WITH MESH Left 01/01/2020   Procedure: XI ROBOTIC ASSISTED INGUINAL HERNIA REPAIR WITH MESH;  Surgeon: Olean Ree, MD;  Location:  [facility] OR[facility];  Service: General;  Laterality: Left;     Medications Prior to Admission: Prior to Admission medications   Medication Sig Start Date End Date Taking? Authorizing Provider  acetaminophen (TYLENOL) 500 MG tablet Take 2 tablets (1,000 mg total) by mouth every 6 (six)  hours as needed for mild pain. 12/14/20   Piscoya, Jacqulyn Bath, MD  acetic acid 2 % otic solution SMARTSIG:In Ear(s) 09/20/21   [provider]  albuterol (VENTOLIN HFA) 108 (90 Base) MCG/ACT inhaler Inhale 2 puffs into the lungs every 6 (six) hours as needed for wheezing or shortness of breath. 11/01/21   Blake Divine, MD  allopurinol (ZYLOPRIM) 100 MG tablet TAKE 1 TABLET(100 MG) BY MOUTH DAILY 07/27/20   Cannady, Henrine Screws T, NP  amiodarone (PACERONE) 200 MG tablet Take 200 mg by mouth daily.    [provider]  amLODipine (NORVASC) 5 MG tablet Take 5 mg by mouth daily. 12/11/21   [provider]  azelastine (ASTELIN) 0.1 % nasal spray Place 1 spray into both nostrils daily. 11/15/21   [provider]  benzonatate (TESSALON) 100 MG capsule Take 100 mg by mouth 3 (three) times daily. 12/16/21   [provider]  calcium carbonate (TUMS - DOSED IN MG ELEMENTAL CALCIUM) 500 MG chewable tablet Chew 1 tablet by mouth 2 (two) times daily as needed for indigestion or heartburn.    [provider]  carvedilol (COREG) 3.125 MG tablet Take 3.125 mg by mouth 2 (two) times daily. 11/15/21   [provider]  carvedilol (COREG) 6.25 MG tablet TAKE 1 TABLET(6.25 MG) BY MOUTH TWICE DAILY WITH A MEAL 01/27/21   Cannady, Jolene T, NP  chlorpheniramine-HYDROcodone (TUSSIONEX) 10-8 MG/5ML Take 5 mLs by mouth at bedtime as needed. 12/06/21   [provider]  clopidogrel (PLAVIX) 75 MG tablet Take 1 tablet (75 mg total) by mouth daily. 07/27/20   Marnee Guarneri T, NP  [employer] CORTISONE MAXIMUM STRENGTH 1 % lotion SMARTSIG:1 sparingly Topical Twice Daily 09/27/21   [provider]  docusate sodium (COLACE) 100 MG capsule Take 100 mg by mouth daily.    [provider]  Emollient (AQUAPHOR ADVANCED THERAPY BABY) OINT SMARTSIG:sparingly Topical Twice Daily 09/27/21   [provider]  fluticasone (FLONASE) 50 MCG/ACT nasal spray Place 1 spray into  both nostrils daily. 09/08/21   [provider]  furosemide (LASIX) 40 MG tablet Take 1 tablet (40 mg) by mouth once daily 08/11/21   Deboraha Sprang, MD  isosorbide mononitrate (IMDUR) 120 MG 24 hr tablet Take 1 tablet (120 mg) by mouth once daily AT BEDTIME 08/11/21   Deboraha Sprang, MD  isosorbide mononitrate (IMDUR) 30 MG 24 hr tablet Take 2 tablets by mouth 2 (two) times daily. 08/16/21   [provider]  levothyroxine (SYNTHROID) 100 MCG tablet Take 1 tablet (100 mcg total) by mouth daily before breakfast. 08/18/21   Deboraha Sprang, MD  levothyroxine (SYNTHROID) 75 MCG tablet Take 50 mcg by mouth daily. 08/18/21   [provider]  loratadine (CLARITIN) 10 MG tablet Take 1 tablet (10 mg total) by mouth daily. 03/30/21   Cannady, Henrine Screws T, NP  losartan (COZAAR) 100 MG tablet Take 1 tablet (100 mg total) by mouth daily. 08/11/21 11/09/21  Deboraha Sprang, MD  losartan (COZAAR) 50 MG tablet Take 100 mg by mouth daily. 08/23/21   [provider]  magnesium oxide (MAG-OX) 400 MG tablet Take 1 tablet by mouth at bedtime. 11/15/21   [provider]  Magnesium Oxide 400 MG CAPS Take 1 capsule (400 mg total) by mouth daily. 04/29/21   Dunn, Areta Haber, PA-C  nitroGLYCERIN (NITROSTAT) 0.4 MG SL tablet Place 1 tablet (0.4 mg total) under the tongue every 5 (five) minutes as needed for chest pain (chest pain). Maximum of 3 doses. 04/11/19   Dunn, Areta Haber, PA-C  pantoprazole (PROTONIX) 40 MG tablet Take 1 tablet (40 mg total) by mouth daily. 07/27/20   Cannady, Jolene T, NP  PATADAY 0.2 % SOLN Apply 1 drop to eye daily. 11/15/21   [provider]  polyethylene glycol (MIRALAX / GLYCOLAX) 17 g packet Take 17 g by mouth daily as needed.    [provider]  rosuvastatin (CRESTOR) 10 MG tablet Take 10 MG (one tablet) by mouth 3 days a week (Monday, Wednesday, Friday). 07/28/20   Cannady, Henrine Screws T, NP  spironolactone (ALDACTONE) 25 MG tablet Take 1 tablet (25 mg total) by mouth  daily. 11/01/21 12/01/21  Blake Divine, MD  traZODone (DESYREL) 50 MG tablet Take 50 mg by mouth at bedtime.    [provider]  VITAMIN D-1000 MAX ST 25 MCG (1000 UT) tablet Take 1,000 Units by mouth daily. 03/22/21   [provider]     Allergies:    Allergies  Allergen Reactions   Aspirin Cough   Lisinopril Itching    Throat itching     Social History:   Social History   Socioeconomic History   Marital status: Married    Spouse name: Not on file   Number of children: Not on file   Years of education: Not on file   Highest education level: Not on file  Occupational History   Not on file  Tobacco Use   Smoking status: Never   Smokeless tobacco: Never  Vaping Use   Vaping Use: Never used  Substance and Sexual Activity   Alcohol use: No   Drug use: No   Sexual activity: Not Currently  Other Topics Concern   Not on file  Social History Narrative   Not on file   Social Determinants of Health   Financial Resource Strain: Not on file  Food Insecurity: Not on file  Transportation Needs: Not on file  Physical Activity: Not on file  Stress: Not on file  Social Connections: Not on file  Intimate Partner Violence: Not on file    Family History:   The patient's family history includes Cancer in his mother. There is no history of Heart disease.    ROS:  Please see the history of present illness.  All other ROS reviewed and negative.     Physical Exam/Data:   Vitals:   12/23/21 0037 12/23/21 0040  BP: (!) 147/85 116/85  Pulse: 77 77  Resp: (!) 28 (!) 23  Temp:  99.3 F (37.4 C)  TempSrc:  Oral  SpO2: 94% 92%  Weight:  81.5 kg   No intake or output data in the 24 hours ending 12/23/21 0145    12/23/2021   12:40 AM 12/22/2021   11:25 AM 08/11/2021    9:48 AM  Last 3 Weights  Weight (lbs) 179 lb 10.8 oz 192 lb 14.4 oz 188 lb  Weight (kg) 81.5 kg 87.5 kg 85.276 kg     Body mass index is 29 kg/m.  General:  Well nourished, well developed,  in no acute distress HEENT: normal Neck: no JVD Vascular: No carotid bruits; Distal pulses 2+ bilaterally   Cardiac:  normal S1,  S2; RRR; no murmur  Lungs:  clear to auscultation bilaterally, no wheezing, rhonchi or rales  Abd: soft, nontender, no hepatomegaly  Ext: no edema Musculoskeletal:  No deformities Skin: warm and dry  Neuro:  no focal abnormalities noted Psych:  Normal affect    EKG:  The ECG that was done 12-23-21 was personally reviewed and demonstrates NSR with 1st deg AVB  Relevant CV Studies: LHC 04-04-21   Prox RCA lesion is 20% stenosed.   Dist RCA lesion is 20% stenosed.   Mid LAD lesion is 40% stenosed.   1.  Superdominant right coronary artery with relatively small LAD.  The LAD has moderate mid stenosis.  Overall, no evidence of obstructive coronary artery disease. 2.  Left ventricular angiography was not performed due to chronic kidney disease.  EF was normal by echo. 3.  Mildly to moderately elevated left ventricular end-diastolic pressure at 22 mmHg.   Recommendations: no obstructive coronary artery disease to explain ventricular tachycardia.  Elevated troponin is likely due to supply demand ischemia in the setting of sustained ventricular tachycardia requiring cardioversion.  TTE 04-03-21 1. Left ventricular ejection fraction, by estimation, is 55 to 60%. The  left ventricle has normal function. The left ventricle has no regional  wall motion abnormalities. There is mild left ventricular hypertrophy.  Left ventricular diastolic parameters  are consistent with Grade I diastolic dysfunction (impaired relaxation).   2. Right ventricular systolic function is normal. The right ventricular  size is normal. There is mildly elevated pulmonary artery systolic  pressure. The estimated right ventricular systolic pressure is 99991111 mmHg.   3. Left atrial size was mildly dilated.   4. The mitral valve is normal in structure. Moderate mitral valve  regurgitation. No  evidence of mitral stenosis.   5. Tricuspid valve regurgitation is mild to moderate.   6. The aortic valve was not well visualized. Aortic valve regurgitation  is mild. No aortic stenosis is present.   7. The inferior vena cava is normal in size with greater than 50%  respiratory variability, suggesting right atrial pressure of 3 mmHg.   cMRI 06-02-21 1. Normal LV size with EF 46%. Wall motion abnormalities as noted above.   2.  Normal RV size and systolic function, EF [id number].   3. LGE pattern is most suggestive of prior infarction in the left circumflex territory (coronary-pattern LGE). With > 50% wall thickness subendocardial LGE in the affected segments, they are unlikely to improve in function with revascularization (unlikely to be viable). Interestingly, no definite explanatory lesion was found at recent catheterization.   4.  Mildly elevated ECV percentage, this is likely nonspecific.   5. At last moderate MR. Unfortunately, flow sequences were not done to quantify. This may be infarct-related MR due to inferolateral/inferior wall motion abnormalities.   VT likely originates from area of scarring in the basal lateral, basal inferior LV.  Laboratory Data:  High Sensitivity Troponin:   Recent Labs  Lab 12/22/21 1123 12/22/21 1330  TROPONINIHS 116* 165*      Chemistry Recent Labs  Lab 12/22/21 1330  NA 133*  K 3.7  CL 97*  CO2 27  GLUCOSE 136*  BUN 22  CREATININE 1.58*  CALCIUM 9.9  MG 2.1  GFRNONAA 43*  ANIONGAP 9    Recent Labs  Lab 12/22/21 1330  PROT 6.5  ALBUMIN 3.7  AST 40  ALT 43  ALKPHOS 57  BILITOT 1.5*   Lipids No results for input(s): "CHOL", "TRIG", "HDL", "LABVLDL", "LDLCALC", "CHOLHDL"  in the last 168 hours. Hematology Recent Labs  Lab 12/22/21 1123  WBC 5.5  RBC 4.37  HGB 13.8  HCT 40.2  MCV 92.0  MCH 31.6  MCHC 34.3  RDW 13.5  PLT 284   Thyroid  Recent Labs  Lab 12/22/21 1330  TSH 8.393*   BNPNo results for input(s):  "BNP", "PROBNP" in the last 168 hours.  DDimer No results for input(s): "DDIMER" in the last 168 hours.   Radiology/Studies:  DG Chest Portable 1 View  Result Date: 12/22/2021 CLINICAL DATA:  Chest pain with shortness of breath. SVT. Unresponsiveness. EXAM: PORTABLE CHEST 1 VIEW COMPARISON:  Radiographs 04/02/2021 and 09/15/2020.  CT 11/01/2021. FINDINGS: 1242 hours. Persistent low lung volumes. The heart size and mediastinal contours are stable. Bibasilar atelectasis, similar to previous study. No superimposed edema, confluent airspace opacity, pneumothorax or significant pleural effusion. No acute osseous findings are evident. Telemetry leads overlie the chest. IMPRESSION: Persistent low lung volumes and bibasilar atelectasis. No acute cardiopulmonary process. Electronically Signed   By: Richardean Sale M.D.   On: 12/22/2021 13:08     Assessment and Plan:   Sustained VT Known history of VT, prior cardiac catheterization, echo, cardiac MRI as outpatient, seen by Dr. Caryl Comes Currently in NSR on amiodarone and lidocaine gtt, stable EP to see in the AM For recurrent VTE, may need to consider increasing lidocaine infusion up to 2 mg Keep K>4, Mag>2 Repeat echocardiogram pending   Elevated troponin Supply/demand mismatch in the setting of VT as above with tachycardia, hypoxia No immediate plan for ischemic work-up at this time He is s/p cardiac catheterization earlier in 2023 with nonobstructive disease  Essential hypertension Stable On amlodipine, isosorbide, losartan   Mitral valve regurgitation Repeat echocardiogram pending  5. Abn TSH: may need adjustment in thyroid supplementation. Will order full thyroid panel   Risk Assessment/Risk Scores:           Severity of Illness: The appropriate patient status for this patient is INPATIENT. Inpatient status is judged to be reasonable and necessary in order to provide the required intensity of service to ensure the patient's safety.  The patient's presenting symptoms, physical exam findings, and initial radiographic and laboratory data in the context of their chronic comorbidities is felt to place them at high risk for further clinical deterioration. Furthermore, it is not anticipated that the patient will be medically stable for discharge from the hospital within 2 midnights of admission.   * I certify that at the point of admission it is my clinical judgment that the patient will require inpatient hospital care spanning beyond 2 midnights from the point of admission due to high intensity of service, high risk for further deterioration and high frequency of surveillance required.*   For questions or updates, please contact [place] Please consult [email or web address] for contact info under     Signed, Rudean Curt, MD, [facility] 12/23/2021 1:45 AM

## 2021-12-24 LAB — BASIC METABOLIC PANEL
Anion gap: 9 (ref 5–15)
BUN: 17 mg/dL (ref 8–23)
CO2: 25 mmol/L (ref 22–32)
Calcium: 8.3 mg/dL — ABNORMAL LOW (ref 8.9–10.3)
Chloride: 94 mmol/L — ABNORMAL LOW (ref 98–111)
Creatinine, Ser: 1.52 mg/dL — ABNORMAL HIGH (ref 0.61–1.24)
GFR, Estimated: 45 mL/min — ABNORMAL LOW (ref >60)
Glucose, Bld: 98 mg/dL (ref 70–99)
Potassium: 3.3 mmol/L — ABNORMAL LOW (ref 3.5–5.1)
Sodium: 128 mmol/L — ABNORMAL LOW (ref 135–145)

## 2021-12-24 LAB — MAGNESIUM: Magnesium: 1.6 mg/dL — ABNORMAL LOW (ref 1.7–2.4)

## 2021-12-24 MED ORDER — POTASSIUM CHLORIDE CRYS ER 20 MEQ PO TBCR
40.0000 meq | EXTENDED_RELEASE_TABLET | ORAL | Status: AC
  Administered 2021-12-24 (×2): 40 meq via ORAL
  Filled 2021-12-24 (×2): qty 2

## 2021-12-24 MED ORDER — MAGNESIUM SULFATE 2 GM/50ML IV SOLN
2.0000 g | Freq: Once | INTRAVENOUS | Status: AC
  Administered 2021-12-24: 2 g via INTRAVENOUS
  Filled 2021-12-24: qty 50

## 2021-12-24 MED ORDER — DIPHENHYDRAMINE HCL 25 MG PO CAPS
25.0000 mg | ORAL_CAPSULE | Freq: Once | ORAL | Status: AC
  Administered 2021-12-24: 25 mg via ORAL
  Filled 2021-12-24: qty 1

## 2021-12-24 MED ORDER — MAGNESIUM SULFATE IN D5W 1-5 GM/100ML-% IV SOLN
1.0000 g | Freq: Once | INTRAVENOUS | Status: AC
  Administered 2021-12-24: 1 g via INTRAVENOUS
  Filled 2021-12-24: qty 100

## 2021-12-24 MED ORDER — MEXILETINE HCL 150 MG PO CAPS
150.0000 mg | ORAL_CAPSULE | Freq: Three times a day (TID) | ORAL | Status: DC
  Administered 2021-12-24 – 2021-12-27 (×11): 150 mg via ORAL
  Filled 2021-12-24 (×11): qty 1

## 2021-12-24 NOTE — Progress Notes (Signed)
Rounding Note    Patient Name: Gerald Hodges Date of Encounter: 12/24/2021  [place] Cardiologist: Nelva Bush, MD   Subjective   NAEO. No further VT since arrival.  Inpatient Medications    Scheduled Meds:  allopurinol  100 mg Oral Daily   carvedilol  3.125 mg Oral BID   Chlorhexidine Gluconate Cloth  6 each Topical Q0600   docusate sodium  100 mg Oral Daily   enoxaparin (LOVENOX) injection  30 mg Subcutaneous Q24H   furosemide  20 mg Oral Daily   levothyroxine  100 mcg Oral QAC breakfast   magnesium oxide  400 mg Oral QHS   rosuvastatin  10 mg Oral QODAY   traZODone  50 mg Oral QHS   Continuous Infusions:  amiodarone 30 mg/hr (12/24/21 0700)   lidocaine 1 mg/min (12/24/21 0700)   PRN Meds: acetaminophen, albuterol, guaiFENesin, ondansetron (ZOFRAN) IV   Vital Signs    Vitals:   12/24/21 0500 12/24/21 0600 12/24/21 0700 12/24/21 0722  BP: 113/67 109/68 114/66   Pulse: 62 63 (!) 59   Resp: 20 (!) 21 (!) 22   Temp:    99.2 F (37.3 C)  TempSrc:    Oral  SpO2: 93% 93% 94%   Weight:      Height:        Intake/Output Summary (Last 24 hours) at 12/24/2021 0757 Last data filed at 12/24/2021 0700 Gross per 24 hour  Intake 918.06 ml  Output --  Net 918.06 ml      12/23/2021   12:40 AM 12/22/2021   11:25 AM 08/11/2021    9:48 AM  Last 3 Weights  Weight (lbs) 179 lb 10.8 oz 192 lb 14.4 oz 188 lb  Weight (kg) 81.5 kg 87.5 kg 85.276 kg      Telemetry    sinus - Personally Reviewed  ECG    Personally Reviewed  Physical Exam   GEN: No acute distress.  Elderly in bed at 30 degrees. Neck: No JVD Cardiac: RRR, no murmurs, rubs, or gallops.  Respiratory: Clear to auscultation bilaterally. GI: Soft, nontender, non-distended  MS: No edema; No deformity. Neuro:  Nonfocal  Psych: Normal affect   Labs    High Sensitivity Troponin:   Recent Labs  Lab 12/22/21 1123 12/22/21 1330  TROPONINIHS 116* 165*      Chemistry Recent Labs  Lab 12/22/21 1330 12/23/21 0513 12/24/21 0543  NA 133* 134* 128*  K 3.7 4.0 3.3*  CL 97* 99 94*  CO2 27 25 25   GLUCOSE 136* 120* 98  BUN 22 17 17   CREATININE 1.58* 1.53* 1.52*  CALCIUM 9.9 9.2 8.3*  MG 2.1  --  1.6*  PROT 6.5 6.0*  --   ALBUMIN 3.7 3.3*  --   AST 40 32  --   ALT 43 35  --   ALKPHOS 57 56  --   BILITOT 1.5* 1.1  --   GFRNONAA 43* 45* 45*  ANIONGAP 9 10 9     Lipids No results for input(s): "CHOL", "TRIG", "HDL", "LABVLDL", "LDLCALC", "CHOLHDL" in the last 168 hours.  Hematology Recent Labs  Lab 12/22/21 1123 12/23/21 0513  WBC 5.5 8.9  RBC 4.37 4.79  HGB 13.8 15.4  HCT 40.2 43.4  MCV 92.0 90.6  MCH 31.6 32.2  MCHC 34.3 35.5  RDW 13.5 13.3  PLT 284 270   Thyroid  Recent Labs  Lab 12/22/21 1330 12/23/21 0513  TSH 8.393*  --   FREET4  --  1.28*    BNPNo results for input(s): "BNP", "PROBNP" in the last 168 hours.  DDimer No results for input(s): "DDIMER" in the last 168 hours.   Radiology    DG Chest Portable 1 View  Result Date: 12/22/2021 CLINICAL DATA:  Chest pain with shortness of breath. SVT. Unresponsiveness. EXAM: PORTABLE CHEST 1 VIEW COMPARISON:  Radiographs 04/02/2021 and 09/15/2020.  CT 11/01/2021. FINDINGS: 1242 hours. Persistent low lung volumes. The heart size and mediastinal contours are stable. Bibasilar atelectasis, similar to previous study. No superimposed edema, confluent airspace opacity, pneumothorax or significant pleural effusion. No acute osseous findings are evident. Telemetry leads overlie the chest. IMPRESSION: Persistent low lung volumes and bibasilar atelectasis. No acute cardiopulmonary process. Electronically Signed   By: Richardean Sale M.D.   On: 12/22/2021 13:08      Assessment & Plan    86 year old man with a history of TIA, CKD 3, hyperlipidemia, GERD, hypertension, ventricular tachycardia, nonobstructive coronary artery disease who was admitted with recurrent ventricular  tachycardia.     #Ventricular tachycardia Quiescent on IV amiodarone and lidocaine. Continue IV medications for at least 24 more hours.   Stop IV lidocaine today and start mexiletine. Tentatively planning to transition to oral amiodarone therapy tomorrow.    He will require an ICD prior to discharge for secondary prophylaxis.  I suspect his ATP is amenable to ATP therapy.   #Nonischemic cardiomyopathy Appears euvolemic on exam.  For questions or updates, please contact [place] Please consult [email or web address] for contact info under        Signed, Vickie Epley, MD  12/24/2021, 7:57 AM

## 2021-12-25 LAB — BASIC METABOLIC PANEL
Anion gap: 7 (ref 5–15)
BUN: 19 mg/dL (ref 8–23)
CO2: 25 mmol/L (ref 22–32)
Calcium: 8.3 mg/dL — ABNORMAL LOW (ref 8.9–10.3)
Chloride: 99 mmol/L (ref 98–111)
Creatinine, Ser: 1.64 mg/dL — ABNORMAL HIGH (ref 0.61–1.24)
GFR, Estimated: 41 mL/min — ABNORMAL LOW (ref >60)
Glucose, Bld: 106 mg/dL — ABNORMAL HIGH (ref 70–99)
Potassium: 3.8 mmol/L (ref 3.5–5.1)
Sodium: 131 mmol/L — ABNORMAL LOW (ref 135–145)

## 2021-12-25 LAB — T3, FREE: T3, Free: 2.1 pg/mL (ref 2.0–4.4)

## 2021-12-25 LAB — MAGNESIUM: Magnesium: 1.9 mg/dL (ref 1.7–2.4)

## 2021-12-25 MED ORDER — CETIRIZINE HCL 5 MG/5ML PO SOLN
5.0000 mg | Freq: Every day | ORAL | Status: AC
  Administered 2021-12-25: 5 mg via ORAL
  Filled 2021-12-25: qty 5

## 2021-12-25 MED ORDER — POTASSIUM CHLORIDE CRYS ER 20 MEQ PO TBCR
20.0000 meq | EXTENDED_RELEASE_TABLET | Freq: Once | ORAL | Status: AC
  Administered 2021-12-25: 20 meq via ORAL
  Filled 2021-12-25: qty 1

## 2021-12-25 MED ORDER — HYDROCORTISONE 1 % EX LOTN
TOPICAL_LOTION | Freq: Two times a day (BID) | CUTANEOUS | Status: DC
  Filled 2021-12-25: qty 118

## 2021-12-25 MED ORDER — AMIODARONE HCL 200 MG PO TABS: 400.0000 mg | ORAL_TABLET | Freq: Every day | ORAL | Status: DC

## 2021-12-25 MED ORDER — DIPHENHYDRAMINE HCL 25 MG PO CAPS
25.0000 mg | ORAL_CAPSULE | Freq: Four times a day (QID) | ORAL | Status: DC | PRN
  Administered 2021-12-25 – 2021-12-26 (×3): 25 mg via ORAL
  Filled 2021-12-25 (×3): qty 1

## 2021-12-25 MED ORDER — AMIODARONE HCL 200 MG PO TABS
400.0000 mg | ORAL_TABLET | Freq: Two times a day (BID) | ORAL | Status: DC
  Administered 2021-12-25 – 2021-12-27 (×5): 400 mg via ORAL
  Filled 2021-12-25 (×5): qty 2

## 2021-12-25 MED ORDER — MAGNESIUM SULFATE IN D5W 1-5 GM/100ML-% IV SOLN
1.0000 g | Freq: Once | INTRAVENOUS | Status: AC
  Administered 2021-12-25: 1 g via INTRAVENOUS
  Filled 2021-12-25: qty 100

## 2021-12-25 MED ORDER — HYDROCORTISONE 1 % EX CREA
TOPICAL_CREAM | Freq: Two times a day (BID) | CUTANEOUS | Status: DC
  Administered 2021-12-27: 1 via TOPICAL
  Filled 2021-12-25: qty 28

## 2021-12-25 NOTE — Progress Notes (Signed)
Rounding Note    Patient Name: Gerald Hodges Date of Encounter: 12/25/2021  [place] Cardiologist: Nelva Bush, MD   Subjective   NAEO. No VT overnight. Wife and I spoke this AM via translator app on [place].  Inpatient Medications    Scheduled Meds:  allopurinol  100 mg Oral Daily   carvedilol  3.125 mg Oral BID   Chlorhexidine Gluconate Cloth  6 each Topical Q0600   docusate sodium  100 mg Oral Daily   enoxaparin (LOVENOX) injection  30 mg Subcutaneous Q24H   furosemide  20 mg Oral Daily   hydrocortisone cream   Topical BID   levothyroxine  100 mcg Oral QAC breakfast   magnesium oxide  400 mg Oral QHS   mexiletine  150 mg Oral Q8H   rosuvastatin  10 mg Oral QODAY   traZODone  50 mg Oral QHS   Continuous Infusions:  amiodarone 30 mg/hr (12/25/21 8341)   PRN Meds: acetaminophen, albuterol, diphenhydrAMINE, guaiFENesin, ondansetron (ZOFRAN) IV   Vital Signs    Vitals:   12/25/21 0400 12/25/21 0500 12/25/21 0600 12/25/21 0737  BP: 113/77 123/68 115/71   Pulse: (!) 54 (!) 54 (!) 52   Resp: 20 (!) 24 (!) 21   Temp:    98 F (36.7 C)  TempSrc:    Oral  SpO2: 92% 96% 95%   Weight:      Height:        Intake/Output Summary (Last 24 hours) at 12/25/2021 0806 Last data filed at 12/25/2021 0500 Gross per 24 hour  Intake 404.86 ml  Output 400 ml  Net 4.86 ml       12/23/2021   12:40 AM 12/22/2021   11:25 AM 08/11/2021    9:48 AM  Last 3 Weights  Weight (lbs) 179 lb 10.8 oz 192 lb 14.4 oz 188 lb  Weight (kg) 81.5 kg 87.5 kg 85.276 kg      Telemetry    sinus - Personally Reviewed  ECG    Personally Reviewed  Physical Exam   GEN: No acute distress.  Elderly in bed at 30 degrees. Neck: No JVD Cardiac: RRR, no murmurs, rubs, or gallops.  Respiratory: Clear to auscultation bilaterally. GI: Soft, nontender, non-distended  MS: No edema; No deformity. Neuro:  Nonfocal  Psych: Normal affect   Labs    High Sensitivity  Troponin:   Recent Labs  Lab 12/22/21 1123 12/22/21 1330  TROPONINIHS 116* 165*      Chemistry Recent Labs  Lab 12/22/21 1330 12/23/21 0513 12/24/21 0543  NA 133* 134* 128*  K 3.7 4.0 3.3*  CL 97* 99 94*  CO2 27 25 25   GLUCOSE 136* 120* 98  BUN 22 17 17   CREATININE 1.58* 1.53* 1.52*  CALCIUM 9.9 9.2 8.3*  MG 2.1  --  1.6*  PROT 6.5 6.0*  --   ALBUMIN 3.7 3.3*  --   AST 40 32  --   ALT 43 35  --   ALKPHOS 57 56  --   BILITOT 1.5* 1.1  --   GFRNONAA 43* 45* 45*  ANIONGAP 9 10 9      Lipids No results for input(s): "CHOL", "TRIG", "HDL", "LABVLDL", "LDLCALC", "CHOLHDL" in the last 168 hours.  Hematology Recent Labs  Lab 12/22/21 1123 12/23/21 0513  WBC 5.5 8.9  RBC 4.37 4.79  HGB 13.8 15.4  HCT 40.2 43.4  MCV 92.0 90.6  MCH 31.6 32.2  MCHC 34.3 35.5  RDW 13.5 13.3  PLT  284 270    Thyroid  Recent Labs  Lab 12/22/21 1330 12/23/21 0513  TSH 8.393*  --   FREET4  --  1.28*     BNPNo results for input(s): "BNP", "PROBNP" in the last 168 hours.  DDimer No results for input(s): "DDIMER" in the last 168 hours.   Radiology    No results found.    Assessment & Plan    86 year old man with a history of TIA, CKD 3, hyperlipidemia, GERD, hypertension, ventricular tachycardia, nonobstructive coronary artery disease who was admitted with recurrent ventricular tachycardia.     #Ventricular tachycardia Quiescent on amiodarone and mexiletine  Stop IV amiodarone today. Start amio 400mg  PO BID x 3 days followed by 400mg  daily  Tentatively planning for ICD prior to discharge. I suspect his ATP is amenable to ATP therapy.  The patient has a non-ischemic CM.  At this time, he meets criteria for ICD implantation for secondary prevention of sudden death.  I have had a thorough discussion with the patient reviewing options.  The patient and their family (if available) have had opportunities to ask questions and have them answered. The patient and I have decided together  through a shared decision making process to proceed with ICD implant at this time.    Risks, benefits, alternatives to ICD implantation were discussed in detail with the patient today. The patient understands that the risks include but are not limited to bleeding, infection, pneumothorax, perforation, tamponade, vascular damage, renal failure, MI, stroke, death, inappropriate shocks, and lead dislodgement and wishes to proceed.     Keep NPO after MN in case we are able to add on for tomorrow.   #Nonischemic cardiomyopathy Appears euvolemic on exam.  For questions or updates, please contact [place] Please consult [email or web address] for contact info under        Signed, Vickie Epley, MD  12/25/2021, 8:06 AM

## 2021-12-26 ENCOUNTER — Other Ambulatory Visit (HOSPITAL_COMMUNITY): Payer: Self-pay

## 2021-12-26 ENCOUNTER — Encounter (HOSPITAL_COMMUNITY)
Admission: AD | Disposition: A | Discharge status: Home Health | Payer: Self-pay | Source: Other Acute Inpatient Hospital | Attending: Cardiology

## 2021-12-26 ENCOUNTER — Encounter: Payer: Self-pay | Admitting: Internal Medicine

## 2021-12-26 DIAGNOSIS — I472 Ventricular tachycardia, unspecified: Secondary | ICD-10-CM | POA: Diagnosis not present

## 2021-12-26 DIAGNOSIS — Z95810 Presence of automatic (implantable) cardiac defibrillator: Secondary | ICD-10-CM

## 2021-12-26 HISTORY — DX: Presence of automatic (implantable) cardiac defibrillator: Z95.810

## 2021-12-26 HISTORY — PX: ICD IMPLANT: EP1208

## 2021-12-26 LAB — BASIC METABOLIC PANEL
Anion gap: 8 (ref 5–15)
Anion gap: 8 (ref 5–15)
BUN: 25 mg/dL — ABNORMAL HIGH (ref 8–23)
BUN: 20 mg/dL (ref 8–23)
CO2: 26 mmol/L (ref 22–32)
CO2: 23 mmol/L (ref 22–32)
Calcium: 8.5 mg/dL — ABNORMAL LOW (ref 8.9–10.3)
Calcium: 8.4 mg/dL — ABNORMAL LOW (ref 8.9–10.3)
Chloride: 97 mmol/L — ABNORMAL LOW (ref 98–111)
Chloride: 99 mmol/L (ref 98–111)
Creatinine, Ser: 1.74 mg/dL — ABNORMAL HIGH (ref 0.61–1.24)
Creatinine, Ser: 1.49 mg/dL — ABNORMAL HIGH (ref 0.61–1.24)
GFR, Estimated: 38 mL/min — ABNORMAL LOW (ref >60)
GFR, Estimated: 46 mL/min — ABNORMAL LOW (ref >60)
Glucose, Bld: 89 mg/dL (ref 70–99)
Glucose, Bld: 118 mg/dL — ABNORMAL HIGH (ref 70–99)
Potassium: 3.9 mmol/L (ref 3.5–5.1)
Potassium: 3.6 mmol/L (ref 3.5–5.1)
Sodium: 131 mmol/L — ABNORMAL LOW (ref 135–145)
Sodium: 130 mmol/L — ABNORMAL LOW (ref 135–145)

## 2021-12-26 LAB — LIDOCAINE LEVEL
Lidocaine Lvl: 2.6 ug/mL (ref 1.5–5.0)
Lidocaine Lvl: 3.1 ug/mL (ref 1.5–5.0)

## 2021-12-26 LAB — MAGNESIUM
Magnesium: 1.9 mg/dL (ref 1.7–2.4)
Magnesium: 1.7 mg/dL (ref 1.7–2.4)

## 2021-12-26 LAB — SURGICAL PCR SCREEN
MRSA, PCR: NEGATIVE
Staphylococcus aureus: NEGATIVE

## 2021-12-26 SURGERY — ICD IMPLANT

## 2021-12-26 MED ORDER — FENTANYL CITRATE (PF) 100 MCG/2ML IJ SOLN
INTRAMUSCULAR | Status: DC | PRN
  Administered 2021-12-26: 25 ug via INTRAVENOUS

## 2021-12-26 MED ORDER — MIDAZOLAM HCL 5 MG/5ML IJ SOLN
INTRAMUSCULAR | Status: DC | PRN
  Administered 2021-12-26: 1 mg via INTRAVENOUS

## 2021-12-26 MED ORDER — POTASSIUM CHLORIDE CRYS ER 20 MEQ PO TBCR
20.0000 meq | EXTENDED_RELEASE_TABLET | Freq: Once | ORAL | Status: AC
  Administered 2021-12-26: 20 meq via ORAL
  Filled 2021-12-26: qty 1

## 2021-12-26 MED ORDER — MIDAZOLAM HCL 5 MG/5ML IJ SOLN
INTRAMUSCULAR | Status: AC
  Filled 2021-12-26: qty 5

## 2021-12-26 MED ORDER — LIDOCAINE HCL (PF) 1 % IJ SOLN
INTRAMUSCULAR | Status: AC
  Filled 2021-12-26: qty 60

## 2021-12-26 MED ORDER — CHLORHEXIDINE GLUCONATE 4 % EX LIQD
60.0000 mL | Freq: Once | CUTANEOUS | Status: AC
  Administered 2021-12-26: 4 via TOPICAL
  Filled 2021-12-26: qty 60

## 2021-12-26 MED ORDER — FENTANYL CITRATE (PF) 100 MCG/2ML IJ SOLN
INTRAMUSCULAR | Status: AC
  Filled 2021-12-26: qty 2

## 2021-12-26 MED ORDER — LIDOCAINE HCL (PF) 1 % IJ SOLN
INTRAMUSCULAR | Status: DC | PRN
  Administered 2021-12-26: 60 mL

## 2021-12-26 MED ORDER — SODIUM CHLORIDE 0.9 % IV SOLN: INTRAVENOUS | Status: DC

## 2021-12-26 MED ORDER — GUAIFENESIN-DM 100-10 MG/5ML PO SYRP
5.0000 mL | ORAL_SOLUTION | ORAL | Status: DC | PRN
  Administered 2021-12-26: 5 mL via ORAL
  Filled 2021-12-26: qty 5

## 2021-12-26 MED ORDER — SODIUM CHLORIDE 0.9 % IV SOLN
INTRAVENOUS | Status: AC
  Filled 2021-12-26: qty 2

## 2021-12-26 MED ORDER — HEPARIN (PORCINE) IN NACL 1000-0.9 UT/500ML-% IV SOLN
INTRAVENOUS | Status: AC
  Filled 2021-12-26: qty 500

## 2021-12-26 MED ORDER — SODIUM CHLORIDE 0.9 % IV SOLN
80.0000 mg | INTRAVENOUS | Status: AC
  Administered 2021-12-26: 80 mg
  Filled 2021-12-26: qty 2

## 2021-12-26 MED ORDER — CEFAZOLIN SODIUM-DEXTROSE 2-4 GM/100ML-% IV SOLN
2.0000 g | INTRAVENOUS | Status: AC
  Administered 2021-12-26: 1 g via INTRAVENOUS
  Administered 2021-12-26: 2 g via INTRAVENOUS
  Filled 2021-12-26: qty 100

## 2021-12-26 MED ORDER — CHLORHEXIDINE GLUCONATE 4 % EX LIQD
60.0000 mL | Freq: Once | CUTANEOUS | Status: AC
  Administered 2021-12-26: 4 via TOPICAL

## 2021-12-26 MED ORDER — HEPARIN (PORCINE) IN NACL 1000-0.9 UT/500ML-% IV SOLN
INTRAVENOUS | Status: DC | PRN
  Administered 2021-12-26: 500 mL

## 2021-12-26 MED ORDER — CEFAZOLIN SODIUM-DEXTROSE 2-4 GM/100ML-% IV SOLN
INTRAVENOUS | Status: AC
  Filled 2021-12-26: qty 100

## 2021-12-26 SURGICAL SUPPLY — 9 items
CABLE SURGICAL S-101-97-12 (CABLE) ×1 IMPLANT
ICD VIGILANT DR D233 (Pacemaker) IMPLANT
LEAD INGEVITY 7841 52 (Lead) IMPLANT
LEAD RELIANCE 0672 IMPLANT
PAD DEFIB RADIO PHYSIO CONN (PAD) ×1 IMPLANT
SHEATH 7FR PRELUDE SNAP 13 (SHEATH) IMPLANT
SHEATH 8FR PRELUDE SNAP 13 (SHEATH) IMPLANT
SHEATH PROBE COVER 6X72 (BAG) IMPLANT
TRAY PACEMAKER INSERTION (PACKS) ×1 IMPLANT

## 2021-12-26 NOTE — Progress Notes (Signed)
Electrophysiology Rounding Note  Patient Name: Gerald Hodges Date of Encounter: 12/26/2021  Primary Cardiologist: Nelva Bush, MD Electrophysiologist: New   Subjective   NAEO  Does have erythema and slight warmth at left wrist. Wife states his "thumb was sore" earlier.   Inpatient Medications    Scheduled Meds:  allopurinol  100 mg Oral Daily   amiodarone  400 mg Oral BID   Followed by   Derrill Memo ON 12/28/2021] amiodarone  400 mg Oral Daily   carvedilol  3.125 mg Oral BID   chlorhexidine  60 mL Topical Once   Chlorhexidine Gluconate Cloth  6 each Topical Q0600   docusate sodium  100 mg Oral Daily   furosemide  20 mg Oral Daily   gentamicin (GARAMYCIN) 80 mg in sodium chloride 0.9 % 500 mL irrigation  80 mg Irrigation On Call   hydrocortisone cream   Topical BID   levothyroxine  100 mcg Oral QAC breakfast   magnesium oxide  400 mg Oral QHS   mexiletine  150 mg Oral Q8H   rosuvastatin  10 mg Oral QODAY   traZODone  50 mg Oral QHS   Continuous Infusions:  sodium chloride      ceFAZolin (ANCEF) IV     PRN Meds: acetaminophen, albuterol, diphenhydrAMINE, guaiFENesin, ondansetron (ZOFRAN) IV   Vital Signs    Vitals:   12/26/21 0400 12/26/21 0500 12/26/21 0600 12/26/21 0700  BP: 126/78 129/72 136/78 137/70  Pulse: (!) 54 (!) 55 62 (!) 58  Resp: (!) 22 (!) 24 (!) 29   Temp:    98.1 F (36.7 C)  TempSrc:    Oral  SpO2: 91% 91% 90% 92%  Weight:      Height:        Intake/Output Summary (Last 24 hours) at 12/26/2021 0823 Last data filed at 12/25/2021 2000 Gross per 24 hour  Intake 36.37 ml  Output --  Net 36.37 ml   Filed Weights   12/23/21 0040  Weight: 81.5 kg    Physical Exam    GEN- The patient is well appearing, alert and oriented x 3 today.   Head- normocephalic, atraumatic Eyes-  Sclera clear, conjunctiva pink Ears- hearing intact Oropharynx- clear Neck- supple Lungs- Clear to ausculation bilaterally, normal work of  breathing Heart- Regular rate and rhythm, no murmurs, rubs or gallops GI- soft, NT, ND, + BS Extremities- no clubbing or cyanosis. No edema Skin- no rash or lesion Psych- euthymic mood, full affect Neuro- strength and sensation are intact  Labs    CBC No results for input(s): "WBC", "NEUTROABS", "HGB", "HCT", "MCV", "PLT" in the last 72 hours. Basic Metabolic Panel Recent Labs    12/25/21 0841 12/26/21 0248  NA 131* 131*  K 3.8 3.9  CL 99 97*  CO2 25 26  GLUCOSE 106* 89  BUN 19 25*  CREATININE 1.64* 1.74*  CALCIUM 8.3* 8.5*  MG 1.9 1.9   Liver Function Tests No results for input(s): "AST", "ALT", "ALKPHOS", "BILITOT", "PROT", "ALBUMIN" in the last 72 hours. No results for input(s): "LIPASE", "AMYLASE" in the last 72 hours. Cardiac Enzymes No results for input(s): "CKTOTAL", "CKMB", "CKMBINDEX", "TROPONINI" in the last 72 hours.   Telemetry    Sinus brady/ NSR 50-60s (personally reviewed)  Radiology    No results found.  Patient Profile     86 year old man with a history of TIA, CKD 3, hyperlipidemia, GERD, hypertension, ventricular tachycardia, nonobstructive coronary artery disease who was admitted with recurrent ventricular tachycardia.  Assessment & Plan    Ventricular tachycardia Quiescent on po amiodarone Continue amiodarone 400 mg BID x 2 more days, then 400 mg daily.  Planning for ICD today as we suspect his VT would be amenable to ATP therapy With assistance from [place] interpreter; explained risks, benefits, and alternatives to ICD implantation, including but not limited to bleeding, infection, pneumothorax, pericardial effusion, lead dislodgement, heart attack, stroke, or death.  Pt verbalized understanding and agrees to proceed.  2. NICM cMRI 05/2021 EF 46%, left cx territory scar, but no explanatory lesion on cath 03/2021 Continue GDMT as tolerated On coreg Losartan on hold with AKI/CKD III  3. AKI on CKD III Cr 1.74 this am. Follow. May need  to reassess resuming losartan as outpatient.   4. Continuity of care [facility] has asked to be contacted prior to discharge as they assist with his medications.  Georgeanne Nim called regarding this pt, she is his primary provider at [facility], Her number is [phone]   For questions or updates, please contact [place] Please consult [email or web address] for contact info under Cardiology/STEMI.  Signed, Shirley Friar, PA-C  12/26/2021, 8:23 AM

## 2021-12-27 ENCOUNTER — Inpatient Hospital Stay (HOSPITAL_COMMUNITY): Payer: Medicare (Managed Care)

## 2021-12-27 ENCOUNTER — Encounter (HOSPITAL_COMMUNITY): Payer: Self-pay | Admitting: Cardiology

## 2021-12-27 LAB — BASIC METABOLIC PANEL
Anion gap: 10 (ref 5–15)
BUN: 20 mg/dL (ref 8–23)
CO2: 25 mmol/L (ref 22–32)
Calcium: 9.1 mg/dL (ref 8.9–10.3)
Chloride: 99 mmol/L (ref 98–111)
Creatinine, Ser: 1.49 mg/dL — ABNORMAL HIGH (ref 0.61–1.24)
GFR, Estimated: 46 mL/min — ABNORMAL LOW (ref >60)
Glucose, Bld: 95 mg/dL (ref 70–99)
Potassium: 4.1 mmol/L (ref 3.5–5.1)
Sodium: 134 mmol/L — ABNORMAL LOW (ref 135–145)

## 2021-12-27 LAB — MAGNESIUM: Magnesium: 1.7 mg/dL (ref 1.7–2.4)

## 2021-12-27 MED ORDER — ACETAMINOPHEN 325 MG PO TABS: 650.0000 mg | ORAL_TABLET | ORAL | Status: AC | PRN

## 2021-12-27 MED ORDER — AMIODARONE HCL 400 MG PO TABS: 400.0000 mg | ORAL_TABLET | Freq: Two times a day (BID) | ORAL | 0 refills | Status: DC

## 2021-12-27 MED ORDER — TRAZODONE HCL 50 MG PO TABS: 50.0000 mg | ORAL_TABLET | Freq: Every day | ORAL | Status: DC

## 2021-12-27 MED ORDER — DOCUSATE SODIUM 100 MG PO CAPS: 100.0000 mg | ORAL_CAPSULE | Freq: Every day | ORAL | 0 refills | Status: DC

## 2021-12-27 MED ORDER — LOSARTAN POTASSIUM 25 MG PO TABS: 25.0000 mg | ORAL_TABLET | Freq: Every day | ORAL | 1 refills | Status: AC

## 2021-12-27 MED ORDER — FUROSEMIDE 20 MG PO TABS: 20.0000 mg | ORAL_TABLET | Freq: Every day | ORAL | Status: AC

## 2021-12-27 MED ORDER — MAGNESIUM SULFATE 2 GM/50ML IV SOLN
2.0000 g | Freq: Once | INTRAVENOUS | Status: AC
  Administered 2021-12-27: 2 g via INTRAVENOUS
  Filled 2021-12-27: qty 50

## 2021-12-27 MED ORDER — MEXILETINE HCL 150 MG PO CAPS: 150.0000 mg | ORAL_CAPSULE | Freq: Three times a day (TID) | ORAL | 6 refills | Status: DC

## 2021-12-27 MED ORDER — CARVEDILOL 3.125 MG PO TABS: 3.1250 mg | ORAL_TABLET | Freq: Two times a day (BID) | ORAL | Status: AC

## 2021-12-27 MED ORDER — AMIODARONE HCL 400 MG PO TABS: 400.0000 mg | ORAL_TABLET | Freq: Every day | ORAL | 0 refills | Status: DC

## 2021-12-27 MED ORDER — CLOPIDOGREL BISULFATE 75 MG PO TABS: 75.0000 mg | ORAL_TABLET | Freq: Every day | ORAL | 4 refills | Status: AC

## 2021-12-27 NOTE — Discharge Summary (Signed)
ELECTROPHYSIOLOGY PROCEDURE DISCHARGE SUMMARY    Patient ID: Gerald Hodges,  MRN: [id number], DOB/AGE: 08-24-1935 86 y.o.  Admit date: 12/23/2021 Discharge date: 12/27/2021  Primary Care Physician: Dionicia Abler, NP (Inactive)  Primary Cardiologist: Nelva Bush, MD  Electrophysiologist: Dr. Quentin Ore    Primary Diagnosis:  Chronic systolic CHF  VT  Secondary Diagnosis: NICM CKDIII HLD HTN CAD, non-obstructive  Allergies  Allergen Reactions   Asa [Aspirin] Cough   Zestril [Lisinopril] Itching    Throat itching      Procedures This Admission:  1.  Implantation of a [employer] dual chamber ICD on 12/26/2021 by Dr. Quentin Ore.  The patient received a [employer] Dr [phone] MRI [phone] with Peak [phone] right atrial lead and [place] [phone] right ventricular lead. Pt did not require an LV lead. DFTs were deferred at time of implant There were no post procedure complications 2.  CXR on 12/27/21 demonstrated no pneumothorax status post device implantation.      Brief HPI: Gerald Hodges is a 86 y.o. male was admitted for dizziness and ventricular tachycardia and consulted by electrophysiology  for consideration of ICD implantation.  Past medical history includes above.  The patient has persistent LV dysfunction despite guideline directed therapy.  Risks, benefits, and alternatives to ICD implantation were reviewed with the patient who wished to proceed.   Hospital Course:  The patient was admitted as above for symptomatic ventricular tachycardia. He was started on IV amiodarone with conversion to NSR.  He was also briefly on lidocaine. EP was asked to see, and it was felt his VT would be amenable to ATP. He was loaded on IV Amiodarone and transitioned to po dose for home. He underwent implantation of a [employer] dual chamber ICD with details as outlined above. They were monitored on  telemetry overnight which demonstrated appropriate pacing .  Left chest was without hematoma or ecchymosis.  The device was interrogated and found to be functioning normally.  CXR was obtained and demonstrated no pneumothorax status post device implantation..  Wound care, arm mobility, and restrictions were reviewed with the patient via interpreter, printed off in [place], and reviewed with his [place] speaking son.  The patient was examined and considered stable for discharge to home.   The patient's discharge medications include an ACE-I/ARB/ARNI (losartan) and beta blocker (coreg).   I personally discussed his discharge and medications with [facility] provider L. Herring, who stated he will get [facility] and PT through [facility], and that his updated meds will be delivered to his home this afternoon.   Anticoagulation resumption the patient should resume their Clopidogrel on Saturday, 10/21  Physical Exam: Vitals:   12/27/21 1048 12/27/21 1100 12/27/21 1200 12/27/21 1300  BP:  122/72 119/69   Pulse:  60 60 (!) 59  Resp:  17 17 11   Temp: 98 F (36.7 C)     TempSrc: Oral     SpO2:  95% 97% 97%  Weight:      Height:        GEN- The patient is well appearing, alert and oriented x 3 today.   HEENT: normocephalic, atraumatic; sclera clear, conjunctiva pink; hearing intact; oropharynx clear; neck supple, no JVP Lymph- no cervical lymphadenopathy Lungs- Clear to ausculation bilaterally, normal work of breathing.  No wheezes, rales, rhonchi Heart- Regular rate and rhythm, no murmurs, rubs or gallops, PMI not laterally displaced GI- soft, non-tender, non-distended, bowel sounds present, no hepatosplenomegaly Extremities- no clubbing, cyanosis, or  edema; DP/PT/radial pulses 2+ bilaterally MS- no significant deformity or atrophy Skin- warm and dry, no rash or lesion. ICD site stable. Psych- euthymic mood, full affect Neuro- strength and sensation are intact   Labs:   Lab Results  Component Value Date    WBC 8.9 12/23/2021   HGB 15.4 12/23/2021   HCT 43.4 12/23/2021   MCV 90.6 12/23/2021   PLT 270 12/23/2021    Recent Labs  Lab 12/23/21 0513 12/24/21 0543 12/27/21 0437  NA 134*   < > 134*  K 4.0   < > 4.1  CL 99   < > 99  CO2 25   < > 25  BUN 17   < > 20  CREATININE 1.53*   < > 1.49*  CALCIUM 9.2   < > 9.1  PROT 6.0*  --   --   BILITOT 1.1  --   --   ALKPHOS 56  --   --   ALT 35  --   --   AST 32  --   --   GLUCOSE 120*   < > 95   < > = values in this interval not displayed.    Discharge Medications:  Allergies as of 12/27/2021       Reactions   Asa [aspirin] Cough   Zestril [lisinopril] Itching   Throat itching        Medication List     STOP taking these medications    amLODipine 5 MG tablet Commonly known as: NORVASC   spironolactone 25 MG tablet Commonly known as: Aldactone   TYLENOL PO Replaced by: acetaminophen 325 MG tablet       TAKE these medications    acetaminophen 325 MG tablet Commonly known as: TYLENOL Take 2 tablets (650 mg total) by mouth every 4 (four) hours as needed for headache or mild pain. Replaces: TYLENOL PO   albuterol 108 (90 Base) MCG/ACT inhaler Commonly known as: VENTOLIN HFA Inhale 2 puffs into the lungs every 6 (six) hours as needed for wheezing or shortness of breath.   allopurinol 100 MG tablet Commonly known as: ZYLOPRIM TAKE 1 TABLET(100 MG) BY MOUTH DAILY What changed:  how much to take how to take this when to take this additional instructions   amiodarone 400 MG tablet Commonly known as: PACERONE Take 1 tablet (400 mg total) by mouth 2 (two) times daily for 3 days. What changed:  medication strength how much to take when to take this   amiodarone 400 MG tablet Commonly known as: PACERONE Take 1 tablet (400 mg total) by mouth daily. Start taking on: December 31, 2021 What changed: You were already taking a medication with the same name, and this prescription was added. Make sure you understand  how and when to take each.   Aquaphor Adv Protect Healing 41 % Oint Apply 1 application  topically daily as needed (dry skin).   benzonatate 100 MG capsule Commonly known as: TESSALON Take 100 mg by mouth 3 (three) times daily.   calcium carbonate 500 MG chewable tablet Commonly known as: TUMS - dosed in mg elemental calcium Chew 500 mg by mouth daily as needed for indigestion or heartburn.   carvedilol 3.125 MG tablet Commonly known as: COREG Take 1 tablet (3.125 mg total) by mouth 2 (two) times daily. What changed: Another medication with the same name was removed. Continue taking this medication, and follow the directions you see here.   chlorpheniramine-HYDROcodone 10-8 MG/5ML Commonly known as: TUSSIONEX  Take 5 mLs by mouth at bedtime as needed.   clopidogrel 75 MG tablet Commonly known as: PLAVIX Take 1 tablet (75 mg total) by mouth daily. Start taking on: December 31, 2021 What changed: These instructions start on December 31, 2021. If you are unsure what to do until then, ask your doctor or other care provider. Notes to patient: Start taking on October 21st, 2023   docusate sodium 100 MG capsule Commonly known as: COLACE Take 1 capsule (100 mg total) by mouth daily.   furosemide 20 MG tablet Commonly known as: LASIX Take 1 tablet (20 mg total) by mouth daily. Start taking on: December 28, 2021 What changed:  medication strength how much to take how to take this when to take this additional instructions   hydrocortisone cream 1 % Apply 1 Application topically daily as needed for itching.   isosorbide mononitrate 120 MG 24 hr tablet Commonly known as: IMDUR Take 1 tablet (120 mg) by mouth once daily AT BEDTIME What changed:  how much to take how to take this when to take this additional instructions   levothyroxine 100 MCG tablet Commonly known as: Synthroid Take 1 tablet (100 mcg total) by mouth daily before breakfast.   loratadine 10 MG  tablet Commonly known as: CLARITIN Take 1 tablet (10 mg total) by mouth daily.   losartan 25 MG tablet Commonly known as: COZAAR Take 1 tablet (25 mg total) by mouth daily. What changed:  medication strength how much to take   Magnesium Oxide 400 MG Caps Take 1 capsule (400 mg total) by mouth daily.   mexiletine 150 MG capsule Commonly known as: MEXITIL Take 1 capsule (150 mg total) by mouth every 8 (eight) hours.   nitroGLYCERIN 0.4 MG SL tablet Commonly known as: NITROSTAT Place 1 tablet (0.4 mg total) under the tongue every 5 (five) minutes as needed for chest pain (chest pain). Maximum of 3 doses.   pantoprazole 40 MG tablet Commonly known as: PROTONIX Take 1 tablet (40 mg total) by mouth daily.   rosuvastatin 10 MG tablet Commonly known as: Crestor Take 10 MG (one tablet) by mouth 3 days a week (Monday, Wednesday, Friday).   traZODone 50 MG tablet Commonly known as: DESYREL Take 1 tablet (50 mg total) by mouth at bedtime.   VITAMIN D-3 PO Take 1 capsule by mouth daily.        Disposition:    Follow-up Information     [facility] [place] Follow up.   Why: on 10/30 at [facility] for post hospital follow up Contact information: [place] [id number] [phone]                Duration of Discharge Encounter: Greater than 30 minutes including physician time.  Jacalyn Lefevre, PA-C  12/27/2021 1:23 PM

## 2021-12-27 NOTE — Evaluation (Signed)
Physical Therapy Evaluation Patient Details Name: Gerald Hodges MRN: [id number] DOB: Apr 09, 1935 Today's Date: 12/27/2021  History of Present Illness  86 yo transferred from [facility] 10/13 with VT. Pt s/p ICD 10/16. PMHx: Lt TKA, NICM, AKI, CAD, HTN, HLD, moderate MR, gout, CKD  Clinical Impression  Pt pleasant, HOH with wife and sister present. Interpreter used with pt unable to hear interpreter so sister then relaying information to pt. Pt and family educated for ICD precautions, safety, transfers and gait. Pt normally independent walking without AD but currently requires physical assist for transfers and gait along with RW. Family to provide support at D/C and report understanding of education. Pt with decreased functional mobility, safety and independence who will benefit from acute therapy to maximize mobility, safety and independence.   HR 60 96%     Recommendations for follow up therapy are one component of a multi-disciplinary discharge planning process, led by the attending physician.  Recommendations may be updated based on patient status, additional functional criteria and insurance authorization.  Follow Up Recommendations Home health PT      Assistance Recommended at Discharge Frequent or constant Supervision/Assistance  Patient can return home with the following  A little help with walking and/or transfers;Assistance with cooking/housework;Direct supervision/assist for medications management;Assist for transportation;Help with stairs or ramp for entrance;A little help with bathing/dressing/bathroom    Equipment Recommendations None recommended by PT  Recommendations for Other Services       Functional Status Assessment Patient has had a recent decline in their functional status and demonstrates the ability to make significant improvements in function in a reasonable and predictable amount of time.     Precautions / Restrictions  Precautions Precautions: Fall;ICD/Pacemaker      Mobility  Bed Mobility Overal bed mobility: Needs Assistance Bed Mobility: Rolling, Sidelying to Sit Rolling: Min assist Sidelying to sit: Min assist       General bed mobility comments: multimodal cues    Transfers Overall transfer level: Needs assistance   Transfers: Sit to/from Stand Sit to Stand: Min assist           General transfer comment: min assist with multimodal cues to place LUE across chest for sit<>stand    Ambulation/Gait Ambulation/Gait assistance: Min assist Gait Distance (Feet): 200 Feet Assistive device: Rolling walker (2 wheels) Gait Pattern/deviations: Step-through pattern, Decreased stride length   Gait velocity interpretation: <1.8 ft/sec, indicate of risk for recurrent falls   General Gait Details: pt with difficulty directing RW as times with multimodal cues to step into RW to maintain LUE proximity  Stairs            Wheelchair Mobility    Modified Rankin (Stroke Patients Only)       Balance Overall balance assessment: Needs assistance   Sitting balance-Leahy Scale: Fair     Standing balance support: Bilateral upper extremity supported Standing balance-Leahy Scale: Poor                               Pertinent Vitals/Pain Pain Assessment Pain Assessment: No/denies pain    Home Living Family/patient expects to be discharged to:: Private residence Living Arrangements: Spouse/significant other;Children Available Help at Discharge: Family;Available 24 hours/day Type of Home: House Home Access: Stairs to enter   [employer] of Steps: 2   Home Layout: One level Home Equipment: Conservation officer, nature (2 wheels);Cane - single point      Prior Function Prior Level of Function :  Independent/Modified Independent                     Hand Dominance        Extremity/Trunk Assessment   Upper Extremity Assessment Upper Extremity Assessment:  Generalized weakness    Lower Extremity Assessment Lower Extremity Assessment: Generalized weakness    Cervical / Trunk Assessment Cervical / Trunk Assessment: Kyphotic  Communication   Communication: Prefers language other than English;Interpreter utilized Medical laboratory scientific officer ipad interpreter [phone])  Cognition Arousal/Alertness: Awake/alert Behavior During Therapy: WFL for tasks assessed/performed Overall Cognitive Status: Impaired/Different from baseline Area of Impairment: Memory, Following commands                     Memory: Decreased recall of precautions Following Commands: Follows one step commands consistently       General Comments: cues for sequence and safety        General Comments      Exercises     Assessment/Plan    PT Assessment Patient needs continued PT services  PT Problem List Decreased strength;Decreased mobility;Decreased activity tolerance;Decreased balance;Decreased knowledge of use of DME;Decreased knowledge of precautions       PT Treatment Interventions Gait training;Therapeutic exercise;Functional mobility training;DME instruction;Therapeutic activities;Patient/family education;Balance training    PT Goals (Current goals can be found in the Care Plan section)  Acute Rehab PT Goals Patient Stated Goal: return home PT Goal Formulation: With patient/family Time For Goal Achievement: 01/10/22 Potential to Achieve Goals: Fair    Frequency Min 3X/week     Co-evaluation               AM-PAC PT "6 Clicks" Mobility  Outcome Measure Help needed turning from your back to your side while in a flat bed without using bedrails?: A Little Help needed moving from lying on your back to sitting on the side of a flat bed without using bedrails?: A Little Help needed moving to and from a bed to a chair (including a wheelchair)?: A Little Help needed standing up from a chair using your arms (e.g., wheelchair or bedside chair)?: A  Little Help needed to walk in hospital room?: A Lot Help needed climbing 3-5 steps with a railing? : A Lot 6 Click Score: 16    End of Session Equipment Utilized During Treatment: Gait belt Activity Tolerance: Patient tolerated treatment well Patient left: in chair;with call bell/phone within reach;with family/visitor present Nurse Communication: Mobility status PT Visit Diagnosis: Other abnormalities of gait and mobility (R26.89)    Time: [id number] PT Time Calculation (min) (ACUTE ONLY): 42 min   Charges:   PT Evaluation $PT Eval Moderate Complexity: 1 Mod PT Treatments $Gait Training: 8-22 mins $Therapeutic Activity: 8-22 mins        Bayard Males, PT Acute Rehabilitation Services Office: [place] 12/27/2021, 1:04 PM

## 2021-12-27 NOTE — Discharge Instructions (Addendum)
Despus del implante de su marcapasos   Tienes un marcapasos de [employer]  No levante el brazo por encima de la altura de los hombros durante 1 semana despus de su procedimiento. Despus de [address] puede progresar como se indica a continuacin.    Tuesday January 03, 2022  Wednesday January 04, 2022 Thursday January 05, 2022 Friday January 06, 2022   No levante ms de 10 libras con el brazo afectado hasta 6 semanas despus del procedimiento. No hay otras restricciones en cuanto al movimiento del brazo despus de su cita de revisin de la herida.   Monitoree el sitio de su marcapasos para ver si tiene enrojecimiento, hinchazn y drenaje. Llame a la [employer] colocaron el dispositivo al [phone] si tiene los sntomas anteriores, o fiebre/resfriado.   Si su herida se cierra con tiritas esterilizadas (Steri-strips) o con grapas: Puede ducharse 7 das despus del procedimiento y lavar su herida con agua y [place]. Si su herida se cierra con [place]: Puede ducharse un da despus del implante del marcapasos y lavar su herida con agua y [place]. Evite el uso de lociones, ungentos o perfumes sobre su herida hasta que est completamente sanada.   Si la herida est completamente cerrada, puede usar un jacuzzi o una piscina despus de su cita de revisin de la herida.   Puede conducir, a menos que sus proveedores de atencin mdica le hayan dicho que no puede conducir.   Es posible su marcapasos es compatible con Visual merchandiser de IRM (Imagen por Resonancia Magntica). Pregunte en su prxima cita.  Su DCI est diseado para protegerle de ritmos cardacos que amenazan con su vida. Debido a esto, es posible que reciba Social worker.    o 1 descarga sin sntomas: Llame a la oficina durante las horas de oficina.  o 1 descarga con sntomas (dolor en el pecho, presin en el pecho, mareos, vrtigos, dificultad para respirar, sensacin general de malestar): Llame al 911.  o Si experimenta  2 o ms descargas en 24 horas: Llame al 911  o Si recibe una descarga, no debe conducir.  o [place] DMV: no puede conducir durante 6 meses si recibe la terapia adecuada de su DCI.   Alertas del DCI: algunas alertas son vibratorias y Nurse, adult. Estas NO son emergencias. Por favor llame a nuestra oficina para informarnos. Si esto ocurre de noche o [employer] fines de [place], puede esperar hasta el siguiente da de [place]. Enve una transmisin remota.   Si su dispositivo puede leer el estado de los lquidos (por la insuficiencia cardaca), se le ofrecer un seguimiento mensual para repasarlo con usted.  La vigilancia remota se [place] para monitorear el marcapasos desde su casa. Esta vigilancia se programa cada [address] en nuestra oficina. Esto nos permite vigilar el funcionamiento de su dispositivo para asegurarnos de que funciona correctamente. Usted ver a su electrofisilogo anualmente (si es necesario, le ver ms a menudo).

## 2021-12-27 NOTE — Progress Notes (Signed)
Discharged to home with wife, sister and son. Questions answered.

## 2022-01-08 NOTE — Progress Notes (Unsigned)
Cardiology Office Note Date:  01/08/2022  Patient ID:  Gerald Hodges, DOB May 08, 1935, MRN [id number] PCP:  Dionicia Abler, NP (Inactive)  Cardiologist:  Dr. Saunders Revel Electrophysiologist: Dr. Caryl Comes >> Dr. Quentin Ore  ***refresh   Chief Complaint: *** wound check  History of Present Illness: Gerald Hodges is a 86 y.o. male with history of TIA, CKD (III), HLD, GERD, HTN, VT, no obstructive CAD (Cath Jan 2023), VT.   He was admitted to [facility] 12/22/21, with symptoms, noted to have VT again.  He reported a few days of dizzy spells, perhaps near syncope, but no other symptoms.  The AM coming in he woke with chest pressure, diaphoresis, tokk a couple doses of his PRN NTG without improvement. He saw his PMD in the AM, found him in a WCT rhythm and called EMS.  He was hypoxic in the 60's% > NRB mask, initial treatment was adenosine  6>12 without change >  calcium and sodium bicarbonate with conversion to sinus rhythm. He started on amio gtt and received a few amio boluses with recurrent VT.  Cardiology saw him there mild HS Trop 116, no plans for repeat ischemic eval.  With recurrent sustained VT (though minimally symptomatic at rest) in d/w EP (Dr. Quentin Ore) lidocaine added And planned for transfer to [facility] for further management. 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 b. TTE 12/17/2019: EF 55-60%; c. 03/2021 Echo: EF 55-60%, GrI DD, mod MR; d. 05/2021 cMRI: LVEF 46%, RVEF 46%, >50% thickness subendocardial LGE sugg of  prior infarct in LCX distribution. At least mod MR.   Abdominal aortic atherosclerosis ([place])    a. MPI 12/12/2019--> very mild.   Anginal pain ([place])    CKD (chronic kidney disease), stage III (HCC)    GERD (gastroesophageal reflux disease)    Gout    History of 2019 novel coronavirus disease (COVID-19) 09/10/2020   Hyperlipidemia    Hypertension    Hypothyroidism    Moderate mitral regurgitation    a. 03/2021 Echo: Mod MR; b. 05/2021 cMRI: at least mod MR.   Nonosbructive CAD (coronary artery disease)    a. 03/2021 Cath: LM nl, LAD 26m, D1 nl, LCX min irregs, RCA 20p/d, RPDA nl, RPAV min irregs, RPL1/2/3 nl.   Osteoarthritis of left knee    Psoriasis    Recurrent umbilical hernia    a. repaired 01/01/2020; recurred.   SNHL (sensory-neural hearing loss), asymmetrical    Sustained ventricular tachycardia ([place]) 04/02/2021   TIA (transient ischemic attack)     Past Surgical History:  Procedure Laterality Date   BUNIONECTOMY Bilateral    CATARACT EXTRACTION W/ INTRAOCULAR LENS  IMPLANT, BILATERAL Bilateral    ICD IMPLANT N/A 12/26/2021   Procedure: ICD IMPLANT;  Surgeon: Vickie Epley, MD;  Location: [place] [facility];  Service: Cardiovascular;  Laterality: N/A;   LEFT HEART CATH AND CORONARY ANGIOGRAPHY N/A 04/04/2021   Procedure: LEFT HEART CATH AND CORONARY ANGIOGRAPHY;  Surgeon: Wellington Hampshire, MD;  Location: [place] [facility];  Service: Cardiovascular;  Laterality: N/A;   TOTAL KNEE  ARTHROPLASTY Left 10/25/2016   Procedure: TOTAL KNEE ARTHROPLASTY;  Surgeon: Earnestine Leys, MD;  Location: [facility];  Service: Orthopedics;  Laterality: Left;   UMBILICAL HERNIA REPAIR N/A 01/01/2020   Procedure: HERNIA REPAIR UMBILICAL ADULT;  Surgeon: Olean Ree, MD;  Location: [facility];  Service: General;  Laterality: N/A;   XI ROBOTIC ASSISTED INGUINAL HERNIA REPAIR WITH MESH Left 01/01/2020   Procedure: XI ROBOTIC ASSISTED INGUINAL HERNIA REPAIR WITH MESH;  Surgeon: Olean Ree, MD;   Location: [facility];  Service: General;  Laterality: Left;    Current Outpatient Medications  Medication Sig Dispense Refill   acetaminophen (TYLENOL) 325 MG tablet Take 2 tablets (650 mg total) by mouth every 4 (four) hours as needed for headache or mild pain.     albuterol (VENTOLIN HFA) 108 (90 Base) MCG/ACT inhaler Inhale 2 puffs into the lungs every 6 (six) hours as needed for wheezing or shortness of breath. 8 g 0   allopurinol (ZYLOPRIM) 100 MG tablet TAKE 1 TABLET(100 MG) BY MOUTH DAILY (Patient taking differently: Take 100 mg by mouth daily.) 90 tablet 4   amiodarone (PACERONE) 400 MG tablet Take 1 tablet (400 mg total) by mouth 2 (two) times daily for 3 days. 6 tablet 0   amiodarone (PACERONE) 400 MG tablet Take 1 tablet (400 mg total) by mouth daily. 60 tablet 0   benzonatate (TESSALON) 100 MG capsule Take 100 mg by mouth 3 (three) times daily.     calcium carbonate (TUMS - DOSED IN MG ELEMENTAL CALCIUM) 500 MG chewable tablet Chew 500 mg by mouth daily as needed for indigestion or heartburn.     carvedilol (COREG) 3.125 MG tablet Take 1 tablet (3.125 mg total) by mouth 2 (two) times daily.     chlorpheniramine-HYDROcodone (TUSSIONEX) 10-8 MG/5ML Take 5 mLs by mouth at bedtime as needed.     Cholecalciferol (VITAMIN D-3 PO) Take 1 capsule by mouth daily.     clopidogrel (PLAVIX) 75 MG tablet Take 1 tablet (75 mg total) by mouth daily. 90 tablet 4   docusate sodium (COLACE) 100 MG capsule Take 1 capsule (100 mg total) by mouth daily. 10 capsule 0   Emollient (AQUAPHOR ADV PROTECT HEALING) 41 % OINT Apply 1 application  topically daily as needed (dry skin).     furosemide (LASIX) 20 MG tablet Take 1 tablet (20 mg total) by mouth daily. 30 tablet    hydrocortisone cream 1 % Apply 1 Application topically daily as needed for itching.     isosorbide mononitrate (IMDUR) 120 MG 24 hr tablet Take 1 tablet (120 mg) by mouth once daily AT BEDTIME (Patient taking differently: Take 120 mg by mouth  at bedtime.) 90 tablet 1   levothyroxine (SYNTHROID) 100 MCG tablet Take 1 tablet (100 mcg total) by mouth daily before breakfast. 30 tablet 1   loratadine (CLARITIN) 10 MG tablet Take 1 tablet (10 mg total) by mouth daily. (Patient not taking: Reported on 12/23/2021) 30 tablet 11   losartan (COZAAR) 25 MG tablet Take 1 tablet (25 mg total) by mouth daily. 90 tablet 1   Magnesium Oxide 400 MG CAPS Take 1 capsule (400 mg total) by mouth daily. (Patient not taking: Reported on 12/23/2021) 90 capsule 3   mexiletine (MEXITIL) 150 MG capsule Take 1 capsule (150 mg total) by mouth every 8 (eight) hours. 90 capsule 6   nitroGLYCERIN (NITROSTAT) 0.4 MG SL tablet Place 1 tablet (0.4 mg total) under the tongue every 5 (five) minutes as needed for chest  pain (chest pain). Maximum of 3 doses. 25 tablet 1   pantoprazole (PROTONIX) 40 MG tablet Take 1 tablet (40 mg total) by mouth daily. 90 tablet 4   rosuvastatin (CRESTOR) 10 MG tablet Take 10 MG (one tablet) by mouth 3 days a week (Monday, Wednesday, Friday). 36 tablet 4   traZODone (DESYREL) 50 MG tablet Take 1 tablet (50 mg total) by mouth at bedtime.     No current facility-administered medications for this visit.    Allergies:   Asa [aspirin] and Zestril [lisinopril]   Social History:  The patient  reports that he has never smoked. He has never used smokeless tobacco. He reports that he does not drink alcohol and does not use drugs.   Family History:  The patient's family history includes Cancer in his mother.  ROS:  Please see the history of present illness.    All other systems are reviewed and otherwise negative.   PHYSICAL EXAM:  VS:  There were no vitals taken for this visit. BMI: There is no height or weight on file to calculate BMI. Well nourished, well developed, in no acute distress HEENT: normocephalic, atraumatic Neck: no JVD, carotid bruits or masses Cardiac:  *** RRR; no significant murmurs, no rubs, or gallops Lungs:  *** CTA b/l,  no wheezing, rhonchi or rales Abd: soft, nontender MS: no deformity or *** atrophy Ext: *** no edema Skin: warm and dry, no rash Neuro:  No gross deficits appreciated Psych: euthymic mood, full affect  *** ICD site is stable, no tethering or discomfort   EKG:  not done today  Device interrogation done today and reviewed by myself:  ***   06/01/21: c.MRI IMPRESSION: 1. Normal LV size with EF 46%. Wall motion abnormalities as noted above. 2.  Normal RV size and systolic function, EF 40%. 3. LGE pattern is most suggestive of prior infarction in the left circumflex territory (coronary-pattern LGE). With > 50% wall thickness subendocardial LGE in the affected segments, they are unlikely to improve in function with revascularization (unlikely to be viable). Interestingly, no definite explanatory lesion was found at recent catheterization. 4.  Mildly elevated ECV percentage, this is likely nonspecific. 5. At last moderate MR. Unfortunately, flow sequences were not done to quantify. This may be infarct-related MR due to inferolateral/inferior wall motion abnormalities.   VT likely originates from area of scarring in the basal lateral, basal inferior LV.     04/04/21: LHC   Prox RCA lesion is 20% stenosed.   Dist RCA lesion is 20% stenosed.   Mid LAD lesion is 40% stenosed.   1.  Superdominant right coronary artery with relatively small LAD.  The LAD has moderate mid stenosis.  Overall, no evidence of obstructive coronary artery disease. 2.  Left ventricular angiography was not performed due to chronic kidney disease.  EF was normal by echo. 3.  Mildly to moderately elevated left ventricular end-diastolic pressure at 22 mmHg.   Recommendations: no obstructive coronary artery disease to explain ventricular tachycardia.  Elevated troponin is likely due to supply demand ischemia in the setting of sustained ventricular tachycardia requiring cardioversion. I consulted EP for  management of this patient.   04/03/21: TTE 1. Left ventricular ejection fraction, by estimation, is 55 to 60%. The  left ventricle has normal function. The left ventricle has no regional  wall motion abnormalities. There is mild left ventricular hypertrophy.  Left ventricular diastolic parameters  are consistent with Grade I diastolic dysfunction (impaired relaxation).   2. Right  ventricular systolic function is normal. The right ventricular  size is normal. There is mildly elevated pulmonary artery systolic  pressure. The estimated right ventricular systolic pressure is 99991111 mmHg.   3. Left atrial size was mildly dilated.   4. The mitral valve is normal in structure. Moderate mitral valve  regurgitation. No evidence of mitral stenosis.   5. Tricuspid valve regurgitation is mild to moderate.   6. The aortic valve was not well visualized. Aortic valve regurgitation  is mild. No aortic stenosis is present.   7. The inferior vena cava is normal in size with greater than 50%  respiratory variability, suggesting right atrial pressure of 3 mmHg.       Recent Labs: 11/01/2021: B Natriuretic Peptide 89.7 12/22/2021: TSH 8.393 12/23/2021: ALT 35; Hemoglobin 15.4; Platelets 270 12/27/2021: BUN 20; Creatinine, Ser 1.49; Magnesium 1.7; Potassium 4.1; Sodium 134  04/04/2021: Cholesterol 140; HDL 43; LDL Cholesterol 78; Total CHOL/HDL Ratio 3.3; Triglycerides 94; VLDL 19   CrCl cannot be calculated (Unknown ideal weight.).   Wt Readings from Last 3 Encounters:  12/23/21 179 lb 10.8 oz (81.5 kg)  12/22/21 192 lb 14.4 oz (87.5 kg)  08/11/21 188 lb (85.3 kg)     Other studies reviewed: Additional studies/records reviewed today include: summarized above  ASSESSMENT AND PLAN:   ICD ***  VT ***    3.  NICM 4.  Chronic CHF LVEF 46% by c.MRI LGE pattern is most suggestive of prior infarction in the left circumflex territory (coronary-pattern LGE). With > 50% wall thickness subendocardial  LGE in the affected segments, they are unlikely to improve in function with revascularization (unlikely to be viable). Interestingly, no definite explanatory lesion was found at recent catheterization ***   5. HTN ***   6. MR *** C/w Dr. Armando Gang     Disposition: F/u with ***  Current medicines are reviewed at length with the patient today.  The patient did not have any concerns regarding medicines.  Venetia Night, PA-C 01/08/2022 9:09 AM     [place] [address] [phone] (office)  [phone] (fax)

## 2022-01-09 ENCOUNTER — Ambulatory Visit: Payer: Medicaid Other | Attending: Physician Assistant | Admitting: Physician Assistant

## 2022-01-09 ENCOUNTER — Encounter: Payer: Self-pay | Admitting: Physician Assistant

## 2022-01-09 VITALS — BP 138/80 | HR 64 | Ht 66.0 in | Wt 175.8 lb

## 2022-01-09 DIAGNOSIS — Z79899 Other long term (current) drug therapy: Secondary | ICD-10-CM | POA: Diagnosis not present

## 2022-01-09 DIAGNOSIS — Z95810 Presence of automatic (implantable) cardiac defibrillator: Secondary | ICD-10-CM

## 2022-01-09 DIAGNOSIS — I5022 Chronic systolic (congestive) heart failure: Secondary | ICD-10-CM | POA: Diagnosis not present

## 2022-01-09 DIAGNOSIS — I472 Ventricular tachycardia, unspecified: Secondary | ICD-10-CM

## 2022-01-09 DIAGNOSIS — I10 Essential (primary) hypertension: Secondary | ICD-10-CM

## 2022-01-09 DIAGNOSIS — I428 Other cardiomyopathies: Secondary | ICD-10-CM

## 2022-01-09 DIAGNOSIS — Z5189 Encounter for other specified aftercare: Secondary | ICD-10-CM

## 2022-01-09 LAB — CUP PACEART INCLINIC DEVICE CHECK
Date Time Interrogation Session: 20231030125323
HighPow Impedance: 83 Ohm
Implantable Lead Connection Status: 753985
Implantable Lead Connection Status: 753985
Implantable Lead Implant Date: 20231016
Implantable Lead Implant Date: 20231016
Implantable Lead Location: 753859
Implantable Lead Location: 753860
Implantable Lead Model: 672
Implantable Lead Model: 7841
Implantable Lead Serial Number: 104965
Implantable Lead Serial Number: 228703
Implantable Pulse Generator Implant Date: 20231016
Lead Channel Impedance Value: 405 Ohm
Lead Channel Impedance Value: 632 Ohm
Lead Channel Pacing Threshold Amplitude: 0.7 V
Lead Channel Pacing Threshold Amplitude: 0.9 V
Lead Channel Pacing Threshold Pulse Width: 0.4 ms
Lead Channel Pacing Threshold Pulse Width: 0.4 ms
Lead Channel Sensing Intrinsic Amplitude: 13.4 mV
Lead Channel Sensing Intrinsic Amplitude: 6.2 mV
Lead Channel Setting Pacing Amplitude: 2 V
Lead Channel Setting Pacing Amplitude: 2 V
Lead Channel Setting Pacing Pulse Width: 0.4 ms
Lead Channel Setting Sensing Sensitivity: 0.6 mV
Pulse Gen Serial Number: 619056
Zone Setting Status: 755011

## 2022-01-09 MED ORDER — MEXILETINE HCL 150 MG PO CAPS: 150.0000 mg | ORAL_CAPSULE | Freq: Two times a day (BID) | ORAL | 2 refills | Status: DC

## 2022-01-09 NOTE — Patient Instructions (Addendum)
Medication Instructions:   START TAKING: MEXILETINE 150 MG TWICE A DAY    *If you need a refill on your cardiac medications before your next appointment, please call your pharmacy*   Lab Work:  CMET AND TSH TODAY   If you have labs (blood work) drawn today and your tests are completely normal, you will receive your results only by: [place] (if you have MyChart) OR A paper copy in the mail If you have any lab test that is abnormal or we need to change your treatment, we will call you to review the results.   Testing/Procedures: NONE ORDERED  TODAY    Follow-Up: At [facility], you and your health needs are our priority.  As part of our continuing mission to provide you with exceptional heart care, we have created designated Provider Care Teams.  These Care Teams include your primary Cardiologist (physician) and Advanced Practice Providers (APPs -  Physician Assistants and Nurse Practitioners) who all work together to provide you with the care you need, when you need it.  We recommend signing up for the patient portal called "MyChart".  Sign up information is provided on this After Visit Summary.  MyChart is used to connect with patients for Virtual Visits (Telemedicine).  Patients are able to view lab/test results, encounter notes, upcoming appointments, etc.  Non-urgent messages can be sent to your provider as well.   To learn more about what you can do with MyChart, go to [email or web address].    Your next appointment:    1 month(s)  The format for your next appointment:    In Person  Provider:    You may see  one of the following Advanced Practice Providers on your designated Care Team:   Tommye Standard, [place] Legrand Como "Jonni Sanger" Chalmers Cater, [place]     Other Instructions  YOU HAVE BEEN RECOMMENDED TO WEAR COMPRESSION STOCKINGS  Important Information About Sugar

## 2022-01-10 LAB — COMPREHENSIVE METABOLIC PANEL
ALT: 69 IU/L — ABNORMAL HIGH (ref 0–44)
AST: 44 IU/L — ABNORMAL HIGH (ref 0–40)
Albumin/Globulin Ratio: 1.9 (ref 1.2–2.2)
Albumin: 4.5 g/dL (ref 3.7–4.7)
Alkaline Phosphatase: 106 IU/L (ref 44–121)
BUN/Creatinine Ratio: 10 (ref 10–24)
BUN: 14 mg/dL (ref 8–27)
Bilirubin Total: 1 mg/dL (ref 0.0–1.2)
CO2: 26 mmol/L (ref 20–29)
Calcium: 9.9 mg/dL (ref 8.6–10.2)
Chloride: 92 mmol/L — ABNORMAL LOW (ref 96–106)
Creatinine, Ser: 1.47 mg/dL — ABNORMAL HIGH (ref 0.76–1.27)
Globulin, Total: 2.4 g/dL (ref 1.5–4.5)
Glucose: 103 mg/dL — ABNORMAL HIGH (ref 70–99)
Potassium: 4.8 mmol/L (ref 3.5–5.2)
Sodium: 132 mmol/L — ABNORMAL LOW (ref 134–144)
Total Protein: 6.9 g/dL (ref 6.0–8.5)
eGFR: 47 mL/min/{1.73_m2} — ABNORMAL LOW (ref >59)

## 2022-01-10 LAB — TSH: TSH: 10.5 u[IU]/mL — ABNORMAL HIGH (ref 0.450–4.500)

## 2022-02-13 NOTE — Progress Notes (Deleted)
Electrophysiology Office Note Date: 02/13/2022  ID:  Gerald Hodges, DOB 10/31/1935, MRN [id number]  PCP: Dionicia Abler, NP (Inactive) Primary Cardiologist: Nelva Bush, MD Electrophysiologist: Vickie Epley, MD   CC: Routine ICD follow-up  Gerald Hodges is a 86 y.o. male seen today for Vickie Epley, MD for routine electrophysiology followup. Since last being seen in our clinic the patient reports doing ***.  he denies chest pain, palpitations, dyspnea, PND, orthopnea, nausea, vomiting, dizziness, syncope, edema, weight gain, or early satiety.     {He/she (caps):30048} has not had ICD shocks.   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 b. TTE 12/17/2019: EF 55-60%; c. 03/2021 Echo: EF 55-60%, GrI DD, mod MR; d. 05/2021 cMRI: LVEF 46%, RVEF 46%, >50% thickness subendocardial LGE sugg of prior infarct in LCX distribution. At least mod MR.   Abdominal aortic atherosclerosis ([place])    a. MPI 12/12/2019--> very mild.   Anginal pain ([place])    CKD (chronic kidney disease), stage III (HCC)    GERD (gastroesophageal reflux disease)    Gout    History of 2019 novel coronavirus disease (COVID-19) 09/10/2020   Hyperlipidemia    Hypertension    Hypothyroidism    Moderate mitral regurgitation    a. 03/2021 Echo: Mod MR; b. 05/2021 cMRI: at least mod MR.   Nonosbructive CAD (coronary artery disease)    a. 03/2021 Cath: LM nl, LAD 67m, D1 nl, LCX min irregs, RCA 20p/d, RPDA nl, RPAV min irregs, RPL1/2/3 nl.   Osteoarthritis of left knee    Psoriasis    Recurrent umbilical hernia    a. repaired 01/01/2020; recurred.   SNHL (sensory-neural hearing loss), asymmetrical    Sustained ventricular tachycardia ([place]) 04/02/2021   TIA (transient  ischemic attack)    Past Surgical History:  Procedure Laterality Date   BUNIONECTOMY Bilateral    CATARACT EXTRACTION W/ INTRAOCULAR LENS  IMPLANT, BILATERAL Bilateral    ICD IMPLANT N/A 12/26/2021   Procedure: ICD IMPLANT;  Surgeon: Vickie Epley, MD;  Location: [facility];  Service: Cardiovascular;  Laterality: N/A;   LEFT HEART CATH AND CORONARY ANGIOGRAPHY N/A 04/04/2021   Procedure: LEFT HEART CATH AND CORONARY ANGIOGRAPHY;  Surgeon: Wellington Hampshire, MD;  Location: [facility];  Service: Cardiovascular;  Laterality: N/A;   TOTAL KNEE ARTHROPLASTY Left 10/25/2016   Procedure: TOTAL KNEE ARTHROPLASTY;  Surgeon: Earnestine Leys, MD;  Location: [facility];  Service: Orthopedics;  Laterality: Left;   UMBILICAL HERNIA REPAIR N/A 01/01/2020   Procedure: HERNIA REPAIR UMBILICAL ADULT;  Surgeon: Olean Ree, MD;  Location: [facility];  Service: General;  Laterality: N/A;   XI ROBOTIC ASSISTED INGUINAL HERNIA REPAIR WITH MESH Left 01/01/2020   Procedure: XI ROBOTIC ASSISTED INGUINAL HERNIA REPAIR WITH MESH;  Surgeon: Olean Ree, MD;  Location: [facility];  Service: General;  Laterality: Left;    Current Outpatient Medications  Medication Sig Dispense Refill   acetaminophen (TYLENOL) 325 MG tablet Take 2 tablets (650 mg total) by mouth every 4 (four) hours as needed for headache or mild pain.     albuterol (VENTOLIN HFA) 108 (90 Base) MCG/ACT inhaler Inhale 2 puffs into the lungs every 6 (six) hours as needed for wheezing or shortness of breath. 8 g 0   allopurinol (  ZYLOPRIM) 100 MG tablet TAKE 1 TABLET(100 MG) BY MOUTH DAILY (Patient taking differently: Take 100 mg by mouth daily.) 90 tablet 4   amiodarone (PACERONE) 400 MG tablet Take 1 tablet (400 mg total) by mouth 2 (two) times daily for 3 days. 6 tablet 0   amiodarone (PACERONE) 400 MG tablet Take 1 tablet (400 mg total) by mouth daily. 60 tablet 0   carvedilol (COREG) 3.125 MG tablet Take 1 tablet (3.125 mg total) by  mouth 2 (two) times daily.     clopidogrel (PLAVIX) 75 MG tablet Take 1 tablet (75 mg total) by mouth daily. 90 tablet 4   Emollient (AQUAPHOR ADV PROTECT HEALING) 41 % OINT Apply 1 application  topically daily as needed (dry skin).     furosemide (LASIX) 20 MG tablet Take 1 tablet (20 mg total) by mouth daily. 30 tablet    hydrocortisone cream 1 % Apply 1 Application topically daily as needed for itching.     isosorbide mononitrate (IMDUR) 120 MG 24 hr tablet Take 1 tablet (120 mg) by mouth once daily AT BEDTIME (Patient taking differently: Take 120 mg by mouth at bedtime.) 90 tablet 1   levothyroxine (SYNTHROID) 100 MCG tablet Take 1 tablet (100 mcg total) by mouth daily before breakfast. 30 tablet 1   loratadine (CLARITIN) 10 MG tablet Take 1 tablet (10 mg total) by mouth daily. 30 tablet 11   losartan (COZAAR) 25 MG tablet Take 1 tablet (25 mg total) by mouth daily. 90 tablet 1   Magnesium Oxide 400 MG CAPS Take 1 capsule (400 mg total) by mouth daily. 90 capsule 3   mexiletine (MEXITIL) 150 MG capsule Take 1 capsule (150 mg total) by mouth 2 (two) times daily. 180 capsule 2   nitroGLYCERIN (NITROSTAT) 0.4 MG SL tablet Place 1 tablet (0.4 mg total) under the tongue every 5 (five) minutes as needed for chest pain (chest pain). Maximum of 3 doses. 25 tablet 1   pantoprazole (PROTONIX) 40 MG tablet Take 1 tablet (40 mg total) by mouth daily. 90 tablet 4   rosuvastatin (CRESTOR) 10 MG tablet Take 10 MG (one tablet) by mouth 3 days a week (Monday, Wednesday, Friday). 36 tablet 4   traZODone (DESYREL) 50 MG tablet Take 1 tablet (50 mg total) by mouth at bedtime.     No current facility-administered medications for this visit.    Allergies:   Asa [aspirin] and Zestril [lisinopril]   Social History: Social History   Socioeconomic History   Marital status: Married    Spouse name: Not on file   Number of children: Not on file   Years of education: Not on file   Highest education level: Not on  file  Occupational History   Not on file  Tobacco Use   Smoking status: Never   Smokeless tobacco: Never  Vaping Use   Vaping Use: Never used  Substance and Sexual Activity   Alcohol use: No   Drug use: No   Sexual activity: Not Currently  Other Topics Concern   Not on file  Social History Narrative   Not on file   Social Determinants of Health   Financial Resource Strain: Not on file  Food Insecurity: Not on file  Transportation Needs: Not on file  Physical Activity: Not on file  Stress: Not on file  Social Connections: Not on file  Intimate Partner Violence: Not on file    Family History: Family History  Problem Relation Age of Onset   Cancer  Mother        stomach   Heart disease Neg Hx     Review of Systems: All other systems reviewed and are otherwise negative except as noted above.   Physical Exam: There were no vitals filed for this visit.   GEN- The patient is well appearing, alert and oriented x 3 today.   HEENT: normocephalic, atraumatic; sclera clear, conjunctiva pink; hearing intact; oropharynx clear; neck supple, no JVP Lymph- no cervical lymphadenopathy Lungs- Clear to ausculation bilaterally, normal work of breathing.  No wheezes, rales, rhonchi Heart- {Blank single:19197::"Regular","Irregularly irregular"}  rate and rhythm, no murmurs, rubs or gallops, PMI not laterally displaced GI- soft, non-tender, non-distended, bowel sounds present, no hepatosplenomegaly Extremities- no clubbing or cyanosis. {EDEMA RAQTM:22633} peripheral edema; DP/PT/radial pulses 2+ bilaterally MS- no significant deformity or atrophy Skin- warm and dry, no rash or lesion; ICD pocket well healed Psych- euthymic mood, full affect Neuro- strength and sensation are intact  ICD interrogation- reviewed in detail today,  See PACEART report  EKG:  EKG is not ordered today.  Recent Labs: 11/01/2021: B Natriuretic Peptide 89.7 12/23/2021: Hemoglobin 15.4; Platelets  270 12/27/2021: Magnesium 1.7 01/09/2022: ALT 69; BUN 14; Creatinine, Ser 1.47; Potassium 4.8; Sodium 132; TSH 10.500   Wt Readings from Last 3 Encounters:  01/09/22 175 lb 12.8 oz (79.7 kg)  12/23/21 179 lb 10.8 oz (81.5 kg)  12/22/21 192 lb 14.4 oz (87.5 kg)     Other studies Reviewed: Additional studies/ records that were reviewed today include: Previous EP office notes.   {Select studies to display:26339}   Assessment and Plan:  1.  Chronic systolic dysfunction s/p {Blank single:19197::"Medtronic","St. Jude","[employer]","Biotronik"} {Blank single:19197::"***","single chamber ICD","dual chamber ICD","CRT-D","S-ICD"}  LVEF 46% by c.MRI LGE pattern is most suggestive of prior infarction in the left circumflex territory (coronary-pattern LGE). With > 50% wall thickness subendocardial LGE in the affected segments, they are unlikely to improve in function with revascularization (unlikely to be viable). Interestingly, no definite explanatory lesion was found at recent catheterization Stable on an appropriate medical regimen Normal ICD function See Pace Art report No changes today  VT None further He has some degree of baseline nausea, perhaps more lately Continue mexiletine 150mg  BID  Decrease amidoarone to 200 mg daily Labs today   3. HTN Stable on current regimen    4. MR C/w Dr.   Current medicines are reviewed at length with the patient today.   =  Labs/ tests ordered today include: *** No orders of the defined types were placed in this encounter.    Disposition:   Follow up with {EPMDS:28135} {Blank single:19197::"in 2 weeks","in 4 weeks","in 3 months","in 6 months","in 12 months","as usual post gen change"}    Signed, Cameron Ali, PA-C  02/13/2022 9:03 AM  [facility] [address] [place] [phone] (office) [phone] (fax)

## 2022-02-14 ENCOUNTER — Emergency Department: Payer: Medicaid Other

## 2022-02-14 ENCOUNTER — Other Ambulatory Visit: Payer: Self-pay

## 2022-02-14 ENCOUNTER — Emergency Department
Admission: EM | Admit: 2022-02-14 | Discharge: 2022-02-15 | Disposition: A | Discharge status: Home / Self Care | Payer: Medicaid Other | Attending: Emergency Medicine | Admitting: Emergency Medicine

## 2022-02-14 ENCOUNTER — Encounter: Payer: Self-pay | Admitting: Emergency Medicine

## 2022-02-14 DIAGNOSIS — R059 Cough, unspecified: Secondary | ICD-10-CM | POA: Insufficient documentation

## 2022-02-14 DIAGNOSIS — R55 Syncope and collapse: Secondary | ICD-10-CM | POA: Diagnosis present

## 2022-02-14 DIAGNOSIS — R42 Dizziness and giddiness: Secondary | ICD-10-CM | POA: Diagnosis not present

## 2022-02-14 DIAGNOSIS — N189 Chronic kidney disease, unspecified: Secondary | ICD-10-CM | POA: Diagnosis not present

## 2022-02-14 DIAGNOSIS — N400 Enlarged prostate without lower urinary tract symptoms: Secondary | ICD-10-CM | POA: Diagnosis not present

## 2022-02-14 DIAGNOSIS — K5730 Diverticulosis of large intestine without perforation or abscess without bleeding: Secondary | ICD-10-CM | POA: Diagnosis not present

## 2022-02-14 DIAGNOSIS — I509 Heart failure, unspecified: Secondary | ICD-10-CM | POA: Insufficient documentation

## 2022-02-14 DIAGNOSIS — I700 Atherosclerosis of aorta: Secondary | ICD-10-CM | POA: Diagnosis not present

## 2022-02-14 DIAGNOSIS — K4020 Bilateral inguinal hernia, without obstruction or gangrene, not specified as recurrent: Secondary | ICD-10-CM | POA: Insufficient documentation

## 2022-02-14 LAB — CBC
HCT: 43.9 % (ref 39.0–52.0)
Hemoglobin: 14.7 g/dL (ref 13.0–17.0)
MCH: 32.2 pg (ref 26.0–34.0)
MCHC: 33.5 g/dL (ref 30.0–36.0)
MCV: 96.3 fL (ref 80.0–100.0)
Platelets: 218 10*3/uL (ref 150–400)
RBC: 4.56 MIL/uL (ref 4.22–5.81)
RDW: 13.8 % (ref 11.5–15.5)
WBC: 5.9 10*3/uL (ref 4.0–10.5)
nRBC: 0 % (ref 0.0–0.2)

## 2022-02-14 LAB — URINALYSIS, ROUTINE W REFLEX MICROSCOPIC
Bilirubin Urine: NEGATIVE
Glucose, UA: NEGATIVE mg/dL
Hgb urine dipstick: NEGATIVE
Ketones, ur: NEGATIVE mg/dL
Leukocytes,Ua: NEGATIVE
Nitrite: NEGATIVE
Protein, ur: NEGATIVE mg/dL
Specific Gravity, Urine: 1.027 (ref 1.005–1.030)
pH: 7 (ref 5.0–8.0)

## 2022-02-14 LAB — BASIC METABOLIC PANEL
Anion gap: 3 — ABNORMAL LOW (ref 5–15)
BUN: 18 mg/dL (ref 8–23)
CO2: 17 mmol/L — ABNORMAL LOW (ref 22–32)
Calcium: 5.9 mg/dL — CL (ref 8.9–10.3)
Chloride: 119 mmol/L — ABNORMAL HIGH (ref 98–111)
Creatinine, Ser: 0.97 mg/dL (ref 0.61–1.24)
GFR, Estimated: >60 mL/min (ref >60)
Glucose, Bld: 78 mg/dL (ref 70–99)
Potassium: 3.1 mmol/L — ABNORMAL LOW (ref 3.5–5.1)
Sodium: 139 mmol/L (ref 135–145)

## 2022-02-14 LAB — HEPATIC FUNCTION PANEL
ALT: 20 U/L (ref 0–44)
AST: 24 U/L (ref 15–41)
Albumin: 2.4 g/dL — ABNORMAL LOW (ref 3.5–5.0)
Alkaline Phosphatase: 50 U/L (ref 38–126)
Bilirubin, Direct: 0.3 mg/dL — ABNORMAL HIGH (ref 0.0–0.2)
Indirect Bilirubin: 0.7 mg/dL (ref 0.3–0.9)
Total Bilirubin: 1 mg/dL (ref 0.3–1.2)
Total Protein: 4.3 g/dL — ABNORMAL LOW (ref 6.5–8.1)

## 2022-02-14 LAB — LIPASE, BLOOD: Lipase: 34 U/L (ref 11–51)

## 2022-02-14 MED ORDER — IOHEXOL 350 MG/ML SOLN
100.0000 mL | Freq: Once | INTRAVENOUS | Status: AC | PRN
  Administered 2022-02-14: 100 mL via INTRAVENOUS

## 2022-02-14 NOTE — ED Triage Notes (Addendum)
First Nurse: Pt here via [facility] with a syncopal episode from his doctor's office and passed out in the chair. Pt was diaphoretic, pt has a pacemaker but has an irregular rhythm.   127/75 98% 2L 17 32-end tidal 60 127-cbg  18G LAC-500 cc given by ems

## 2022-02-14 NOTE — ED Triage Notes (Signed)
Patient to ED via [facility] from doctor's office for a syncopal episode. Patient states he was initially at appointment for a cough that started yesterday that he believes it caused "by the little dog at home." Patient denies CP at this time but does have a pacemaker. Aox4, hard of hearing.

## 2022-02-14 NOTE — ED Provider Notes (Signed)
[facility] Provider Note    Event Date/Time   First MD Initiated Contact with Patient 02/14/22 1939     (approximate)   History   Chief Complaint: Loss of Consciousness  Encounter completed with Spanish video interpreter.  History obtained from patient and his spouse at bedside. HPI  Gerald Hodges is a 86 y.o. male with a history of GERD, CKD, heart failure, ventricular tachycardia status post ICD insertion a few weeks ago who comes to the ED due to syncope.  Patient was at his doctor's office, when he seemed to lose consciousness while sitting in a chair.  He was noted to be diaphoretic.  He reports having a frontal headache prior to passing out which is unusual for him.  He denies chest pain or shortness of breath.  No neck pain.  He also reports that over the last several days he has had nonproductive cough, coughing fits make him get dizzy.  Denies any shocks from his ICD.  Currently all symptoms have resolved       Physical Exam   Triage Vital Signs: ED Triage Vitals  Enc Vitals Group     BP 02/14/22 1259 (!) 143/89     Pulse Rate 02/14/22 1259 60     Resp 02/14/22 1259 18     Temp 02/14/22 1259 97.7 F (36.5 C)     Temp Source 02/14/22 1259 Oral     SpO2 02/14/22 1259 99 %     Weight 02/14/22 1948 176 lb (79.8 kg)     Height 02/14/22 1948 5\' 6"  (1.676 m)     Head Circumference --      Peak Flow --      Pain Score 02/14/22 1259 0     Pain Loc --      Pain Edu? --      Excl. in GC? --     Most recent vital signs: Vitals:   02/14/22 2300 02/14/22 2310  BP: (!) 156/86   Pulse: (!) 58   Resp: 17   Temp:  97.8 F (36.6 C)  SpO2: 99%     General: Awake, no distress.  CV:  Good peripheral perfusion.  Normal distal pulses.  Regular rate Resp:  Normal effort.  Clear to auscultation bilaterally Abd:  No distention.  Soft with mild left upper quadrant tenderness.  No peritoneal signs Other:  No lower extremity edema or calf  tenderness.  Moist oral mucosa.  Cranial nerves III through XII intact.   ED Results / Procedures / Treatments   Labs (all labs ordered are listed, but only abnormal results are displayed) Labs Reviewed  BASIC METABOLIC PANEL - Abnormal; Notable for the following components:      Result Value   Potassium 3.1 (*)    Chloride 119 (*)    CO2 17 (*)    Calcium 5.9 (*)    Anion gap 3 (*)    All other components within normal limits  URINALYSIS, ROUTINE W REFLEX MICROSCOPIC - Abnormal; Notable for the following components:   Color, Urine YELLOW (*)    APPearance CLEAR (*)    All other components within normal limits  HEPATIC FUNCTION PANEL - Abnormal; Notable for the following components:   Total Protein 4.3 (*)    Albumin 2.4 (*)    Bilirubin, Direct 0.3 (*)    All other components within normal limits  CBC  LIPASE, BLOOD  CALCIUM, IONIZED  CBG MONITORING, ED     EKG  Interpreted by me Dual paced rhythm, rate of 60.  Left axis, prolonged QTc of 542 ms.  Left bundle branch block, no acute ischemic changes   RADIOLOGY CT abdomen pelvis interpreted by me, negative for ileus or intra-abdominal abscess.  Radiology report reviewed.  CT angiogram head negative for aneurysm, intracranial hemorrhage or other acute findings.   PROCEDURES:  Procedures   MEDICATIONS ORDERED IN ED: Medications  iohexol (OMNIPAQUE) 350 MG/ML injection 100 mL (100 mLs Intravenous Contrast Given 02/14/22 2035)     IMPRESSION / MDM / ASSESSMENT AND PLAN / ED COURSE  I reviewed the triage vital signs and the nursing notes.                              Differential diagnosis includes, but is not limited to, cerebral aneurysm, cerebral hemorrhage, vagal episode, dehydration, GERD, diverticulitis, pancreatitis, arrhythmia  Patient's presentation is most consistent with acute presentation with potential threat to life or bodily function.  Patient presents with episode of syncope associated with a  new onset of frontal headache.  Currently asymptomatic.  Vital signs unremarkable.  Will interrogate pacemaker, check labs, obtain CT angiogram head and CT abdomen pelvis   Clinical Course as of 02/14/22 2345  Tue Feb 14, 2022  2003 Calcium(!!): 5.9 Corrected calcium level is 7.2 [PS]  2143 Interrogation report reveals no arrhythmias.  No shocks delivered.  Patient has remained in sinus rhythm or paced rhythm. [PS]    Clinical Course User Index [PS] Carrie Mew, MD    ----------------------------------------- 11:45 PM on 02/14/2022 ----------------------------------------- Work-up entirely unremarkable.  Vital signs remain unremarkable and patient remains asymptomatic.  He does not require admission and can be discharged home to follow-up with his primary care.  Return precautions discussed.   FINAL CLINICAL IMPRESSION(S) / ED DIAGNOSES   Final diagnoses:  Syncope, unspecified syncope type     Rx / DC Orders   ED Discharge Orders     None        Note:  This document was prepared using Dragon voice recognition software and may include unintentional dictation errors.   Carrie Mew, MD 02/14/22 [phone]

## 2022-02-14 NOTE — ED Notes (Signed)
Pt's provider wants ED MD to call her with information when pt has been seen.   Rhett Bannister  [phone]

## 2022-02-14 NOTE — ED Notes (Signed)
Patient's pacemaker interrogated at this time. Data was sent to [employer]. I spoke with Shanda Bumps a representative with the company and she informed me the report will automatically be faxed when ready within 30 minutes.

## 2022-02-14 NOTE — Discharge Instructions (Addendum)
Your tests in the emergency department today, including pacemaker check, labs, and CT scan of your head and abdomen were all okay.  Please continue to follow-up with your doctor.

## 2022-02-14 NOTE — ED Notes (Signed)
Bloodwork sent to lab earlier with no sticker information. Pt will have labs redraw and sent to lab.

## 2022-02-15 ENCOUNTER — Encounter: Payer: Medicaid Other | Admitting: Student

## 2022-02-15 DIAGNOSIS — I10 Essential (primary) hypertension: Secondary | ICD-10-CM

## 2022-02-15 DIAGNOSIS — I472 Ventricular tachycardia, unspecified: Secondary | ICD-10-CM

## 2022-02-15 DIAGNOSIS — I428 Other cardiomyopathies: Secondary | ICD-10-CM

## 2022-02-15 DIAGNOSIS — I5022 Chronic systolic (congestive) heart failure: Secondary | ICD-10-CM

## 2022-02-16 ENCOUNTER — Ambulatory Visit: Payer: Medicaid Other | Admitting: Internal Medicine

## 2022-02-17 LAB — CALCIUM, IONIZED: Calcium, Ionized, Serum: 4.9 mg/dL (ref 4.5–5.6)

## 2022-03-28 NOTE — Progress Notes (Signed)
Electrophysiology Office Follow up Visit Note:    Date:  03/29/2022   ID:  Gerald Hodges, DOB 12-23-1935, MRN [id number]  PCP:  Dionicia Abler, NP (Inactive)  [facility] Cardiologist:  Nelva Bush, MD  [facility] Electrophysiologist:  Vickie Epley, MD    Interval History:    Gerald Hodges is a 87 y.o. male who presents for a follow up visit. He had an ICD implanted 12/26/2021. Remote monitoring since implant has shown stable device function. He was admitted 02/14/2022 with syncope. Interrogation at that time showed appropriate device function and no HV therapies. Today he is with his son.  There is an in person Spanish interpreter assisting during today's visit.  He tells me that when he was admitted for syncope in December he felt a warmth sensation, over his body.  He then broke out in a cold sweat before losing consciousness.  He tells me that he has had many of these episodes for many years.  He confirms the same set of symptoms preceding each passing out episode.    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 b. TTE 12/17/2019: EF 55-60%; c. 03/2021 Echo: EF 55-60%, GrI DD, mod MR; d. 05/2021 cMRI: LVEF 46%, RVEF 46%, >50% thickness subendocardial LGE sugg of prior infarct in LCX distribution. At least mod MR.   Abdominal aortic atherosclerosis ([place])    a. MPI 12/12/2019--> very mild.   Anginal pain ([place])    CKD (chronic kidney disease), stage III (HCC)    GERD (gastroesophageal reflux disease)    Gout    History of 2019 novel coronavirus disease (COVID-19) 09/10/2020   Hyperlipidemia    Hypertension    Hypothyroidism    Moderate mitral regurgitation    a. 03/2021 Echo: Mod MR; b. 05/2021 cMRI: at least mod MR.   Nonosbructive CAD (coronary artery disease)    a. 03/2021 Cath: LM nl, LAD 60m, D1 nl, LCX min irregs, RCA 20p/d, RPDA nl, RPAV min irregs, RPL1/2/3  nl.   Osteoarthritis of left knee    Psoriasis    Recurrent umbilical hernia    a. repaired 01/01/2020; recurred.   SNHL (sensory-neural hearing loss), asymmetrical    Sustained ventricular tachycardia ([place]) 04/02/2021   TIA (transient ischemic attack)     Past Surgical History:  Procedure Laterality Date   BUNIONECTOMY Bilateral    CATARACT EXTRACTION W/ INTRAOCULAR LENS  IMPLANT, BILATERAL Bilateral    ICD IMPLANT N/A 12/26/2021   Procedure: ICD IMPLANT;  Surgeon: Vickie Epley, MD;  Location: [facility];  Service: Cardiovascular;  Laterality: N/A;   LEFT HEART CATH AND CORONARY ANGIOGRAPHY N/A 04/04/2021   Procedure: LEFT HEART CATH AND CORONARY ANGIOGRAPHY;  Surgeon: Wellington Hampshire, MD;  Location: [facility];  Service: Cardiovascular;  Laterality: N/A;   TOTAL KNEE ARTHROPLASTY Left 10/25/2016   Procedure: TOTAL KNEE ARTHROPLASTY;  Surgeon: Earnestine Leys, MD;  Location: [facility];  Service: Orthopedics;  Laterality: Left;   UMBILICAL HERNIA REPAIR N/A 01/01/2020   Procedure: HERNIA REPAIR UMBILICAL ADULT;  Surgeon: Olean Ree, MD;  Location: [facility];  Service: General;  Laterality: N/A;   XI ROBOTIC ASSISTED INGUINAL HERNIA REPAIR WITH MESH Left 01/01/2020   Procedure: XI ROBOTIC ASSISTED INGUINAL HERNIA REPAIR WITH MESH;  Surgeon: Olean Ree, MD;  Location: [facility];  Service: General;  Laterality: Left;    Current Medications: Current Meds  Medication Sig  acetaminophen (TYLENOL) 325 MG tablet Take 2 tablets (650 mg total) by mouth every 4 (four) hours as needed for headache or mild pain.   albuterol (VENTOLIN HFA) 108 (90 Base) MCG/ACT inhaler Inhale 2 puffs into the lungs every 6 (six) hours as needed for wheezing or shortness of breath.   allopurinol (ZYLOPRIM) 100 MG tablet TAKE 1 TABLET(100 MG) BY MOUTH DAILY (Patient taking differently: Take 100 mg by mouth daily.)   carvedilol (COREG) 3.125 MG tablet Take 1 tablet (3.125 mg total) by mouth  2 (two) times daily.   cholecalciferol (VITAMIN D3) 25 MCG (1000 UNIT) tablet Take 1,000 Units by mouth daily.   clopidogrel (PLAVIX) 75 MG tablet Take 1 tablet (75 mg total) by mouth daily.   Emollient (AQUAPHOR ADV PROTECT HEALING) 41 % OINT Apply 1 application  topically daily as needed (dry skin).   furosemide (LASIX) 20 MG tablet Take 1 tablet (20 mg total) by mouth daily.   hydrocortisone cream 1 % Apply 1 Application topically daily as needed for itching.   isosorbide mononitrate (IMDUR) 120 MG 24 hr tablet Take 1 tablet (120 mg) by mouth once daily AT BEDTIME (Patient taking differently: Take 120 mg by mouth at bedtime.)   levothyroxine (SYNTHROID) 100 MCG tablet Take 1 tablet (100 mcg total) by mouth daily before breakfast.   loratadine (CLARITIN) 10 MG tablet Take 1 tablet (10 mg total) by mouth daily.   losartan (COZAAR) 25 MG tablet Take 1 tablet (25 mg total) by mouth daily.   Magnesium Oxide 400 MG CAPS Take 1 capsule (400 mg total) by mouth daily.   mexiletine (MEXITIL) 150 MG capsule Take 1 capsule (150 mg total) by mouth 2 (two) times daily.   nitroGLYCERIN (NITROSTAT) 0.4 MG SL tablet Place 1 tablet (0.4 mg total) under the tongue every 5 (five) minutes as needed for chest pain (chest pain). Maximum of 3 doses.   pantoprazole (PROTONIX) 40 MG tablet Take 1 tablet (40 mg total) by mouth daily.   rosuvastatin (CRESTOR) 10 MG tablet Take 10 MG (one tablet) by mouth 3 days a week (Monday, Wednesday, Friday).   traZODone (DESYREL) 50 MG tablet Take 1 tablet (50 mg total) by mouth at bedtime.   [DISCONTINUED] amiodarone (PACERONE) 400 MG tablet Take 1 tablet (400 mg total) by mouth 2 (two) times daily for 3 days.     Allergies:   Asa [aspirin] and Zestril [lisinopril]   Social History   Socioeconomic History   Marital status: Married    Spouse name: Not on file   Number of children: Not on file   Years of education: Not on file   Highest education level: Not on file   Occupational History   Not on file  Tobacco Use   Smoking status: Never   Smokeless tobacco: Never  Vaping Use   Vaping Use: Never used  Substance and Sexual Activity   Alcohol use: No   Drug use: No   Sexual activity: Not Currently  Other Topics Concern   Not on file  Social History Narrative   Not on file   Social Determinants of Health   Financial Resource Strain: Not on file  Food Insecurity: Not on file  Transportation Needs: Not on file  Physical Activity: Not on file  Stress: Not on file  Social Connections: Not on file     Family History: The patient's family history includes Cancer in his mother. There is no history of Heart disease.  ROS:   Please  see the history of present illness.    All other systems reviewed and are negative.  EKGs/Labs/Other Studies Reviewed:    The following studies were reviewed today:  03/29/2022 in clinic device interrogation personally reviewed Battery longevity 12.5 years Lead parameter stable Reprogrammed to DDDR/AAIR with VVI backup 60-1 20 to minimize ventricular pacing.  Sensor should help his histogram which is relatively flat.    Recent Labs: 11/01/2021: B Natriuretic Peptide 89.7 12/27/2021: Magnesium 1.7 01/09/2022: TSH 10.500 02/14/2022: ALT 20; BUN 18; Creatinine, Ser 0.97; Hemoglobin 14.7; Platelets 218; Potassium 3.1; Sodium 139  Recent Lipid Panel    Component Value Date/Time   CHOL 140 04/04/2021 0551   CHOL 106 01/27/2021 1006   TRIG 94 04/04/2021 0551   HDL 43 04/04/2021 0551   HDL 35 (L) 01/27/2021 1006   CHOLHDL 3.3 04/04/2021 0551   VLDL 19 04/04/2021 0551   LDLCALC 78 04/04/2021 0551   LDLCALC 45 01/27/2021 1006   LDLDIRECT UNABLETO PERFORMED DUE TO NORMAL TRIG 04/11/2019 0934    Physical Exam:    VS:  BP (!) 142/84   Pulse 60   Ht 5\' 6"  (1.676 m)   Wt 183 lb (83 kg)   BMI 29.54 kg/m     Wt Readings from Last 3 Encounters:  03/29/22 183 lb (83 kg)  02/14/22 176 lb (79.8 kg)  01/09/22  175 lb 12.8 oz (79.7 kg)     GEN:  Well nourished, well developed in no acute distress CARDIAC: RRR, no murmurs, rubs, gallops. ICD pocket well healed. RESPIRATORY:  Clear to auscultation without rales, wheezing or rhonchi  PSYCHIATRIC:  Normal affect        ASSESSMENT:    1. Chronic systolic congestive heart failure ([place])   2. Sustained ventricular tachycardia (HCC)   3. Cardiac defibrillator in situ    PLAN:    In order of problems listed above:  #VT #ICD in situ On amiodarone 400mg  PO daily. Decrease to 200mg  PO daily today. Need to repeat TSH and FT4 today. CMP not needed until 6 mo appointment (just checked in December)  On mexiletine ICD functioning appropriately. Continue remote monitoring  #HFrEF NYHA class II. Continue current therapy including losartan, imdur, lasix, coreg  Follow up in 6 months w APP.   Medication Adjustments/Labs and Tests Ordered: Current medicines are reviewed at length with the patient today.  Concerns regarding medicines are outlined above.  Orders Placed This Encounter  Procedures   T4, free   TSH   EKG 12-Lead   Meds ordered this encounter  Medications   amiodarone (PACERONE) 200 MG tablet    Sig: Take 1 tablet (200 mg total) by mouth daily.    Dispense:  90 tablet    Refill:  0     Signed, Lars Mage, MD, [facility], [facility] 03/29/2022 12:12 PM    Electrophysiology [place]

## 2022-03-29 ENCOUNTER — Encounter: Payer: Self-pay | Admitting: Cardiology

## 2022-03-29 ENCOUNTER — Ambulatory Visit: Payer: Medicaid Other | Attending: Cardiology | Admitting: Cardiology

## 2022-03-29 VITALS — BP 142/84 | HR 60 | Ht 66.0 in | Wt 183.0 lb

## 2022-03-29 DIAGNOSIS — Z95810 Presence of automatic (implantable) cardiac defibrillator: Secondary | ICD-10-CM

## 2022-03-29 DIAGNOSIS — I5022 Chronic systolic (congestive) heart failure: Secondary | ICD-10-CM

## 2022-03-29 DIAGNOSIS — I472 Ventricular tachycardia, unspecified: Secondary | ICD-10-CM

## 2022-03-29 MED ORDER — AMIODARONE HCL 200 MG PO TABS: 200.0000 mg | ORAL_TABLET | Freq: Every day | ORAL | 0 refills | Status: AC

## 2022-03-29 NOTE — Patient Instructions (Addendum)
Medication Instructions:   DECREASE Amiodarone - take 200mg  by mouth daily.   *If you need a refill on your cardiac medications before your next appointment, please call your pharmacy*   Lab Work:  Your physician recommends you go to the medical mall to have lab work completed.  If you have labs (blood work) drawn today and your tests are completely normal, you will receive your results only by: [place] (if you have MyChart) OR A paper copy in the mail If you have any lab test that is abnormal or we need to change your treatment, we will call you to review the results.   Testing/Procedures:  None Ordered   Follow-Up: At [facility], you and your health needs are our priority.  As part of our continuing mission to provide you with exceptional heart care, we have created designated Provider Care Teams.  These Care Teams include your primary Cardiologist (physician) and Advanced Practice Providers (APPs -  Physician Assistants and Nurse Practitioners) who all work together to provide you with the care you need, when you need it.  We recommend signing up for the patient portal called "MyChart".  Sign up information is provided on this After Visit Summary.  MyChart is used to connect with patients for Virtual Visits (Telemedicine).  Patients are able to view lab/test results, encounter notes, upcoming appointments, etc.  Non-urgent messages can be sent to your provider as well.   To learn more about what you can do with MyChart, go to [email or web address].    Your next appointment:   12 month(s)  Provider:   Lars Mage, MD or Mamie Levers, NP

## 2022-03-31 ENCOUNTER — Ambulatory Visit: Payer: Medicaid Other | Attending: Cardiology

## 2022-03-31 DIAGNOSIS — I428 Other cardiomyopathies: Secondary | ICD-10-CM

## 2022-04-03 LAB — CUP PACEART REMOTE DEVICE CHECK
Battery Remaining Longevity: 132 mo
Battery Remaining Percentage: 100 %
Brady Statistic RA Percent Paced: 99 %
Brady Statistic RV Percent Paced: 2 %
Date Time Interrogation Session: 20240119013100
HighPow Impedance: 74 Ohm
Implantable Lead Connection Status: 753985
Implantable Lead Connection Status: 753985
Implantable Lead Implant Date: 20231016
Implantable Lead Implant Date: 20231016
Implantable Lead Location: 753859
Implantable Lead Location: 753860
Implantable Lead Model: 672
Implantable Lead Model: 7841
Implantable Lead Serial Number: 104965
Implantable Lead Serial Number: 228703
Implantable Pulse Generator Implant Date: 20231016
Lead Channel Impedance Value: 384 Ohm
Lead Channel Impedance Value: 665 Ohm
Lead Channel Pacing Threshold Amplitude: 0.6 V
Lead Channel Pacing Threshold Amplitude: 1.1 V
Lead Channel Pacing Threshold Pulse Width: 0.4 ms
Lead Channel Pacing Threshold Pulse Width: 0.4 ms
Lead Channel Setting Pacing Amplitude: 2 V
Lead Channel Setting Pacing Amplitude: 2.2 V
Lead Channel Setting Pacing Pulse Width: 0.4 ms
Lead Channel Setting Sensing Sensitivity: 0.6 mV
Pulse Gen Serial Number: 619056
Zone Setting Status: 755011

## 2022-04-17 NOTE — Progress Notes (Signed)
Remote ICD transmission.   

## 2022-05-18 ENCOUNTER — Encounter: Payer: Self-pay | Admitting: Cardiology

## 2022-06-30 ENCOUNTER — Ambulatory Visit (INDEPENDENT_AMBULATORY_CARE_PROVIDER_SITE_OTHER): Payer: PRIVATE HEALTH INSURANCE

## 2022-06-30 DIAGNOSIS — I428 Other cardiomyopathies: Secondary | ICD-10-CM | POA: Diagnosis not present

## 2022-06-30 LAB — CUP PACEART REMOTE DEVICE CHECK
Battery Remaining Longevity: 150 mo
Battery Remaining Percentage: 100 %
Brady Statistic RA Percent Paced: 79 %
Brady Statistic RV Percent Paced: 8 %
Date Time Interrogation Session: 20240419013100
HighPow Impedance: 78 Ohm
Implantable Lead Connection Status: 753985
Implantable Lead Connection Status: 753985
Implantable Lead Implant Date: 20231016
Implantable Lead Implant Date: 20231016
Implantable Lead Location: 753859
Implantable Lead Location: 753860
Implantable Lead Model: 672
Implantable Lead Model: 7841
Implantable Lead Serial Number: 104965
Implantable Lead Serial Number: 228703
Implantable Pulse Generator Implant Date: 20231016
Lead Channel Impedance Value: 377 Ohm
Lead Channel Impedance Value: 643 Ohm
Lead Channel Pacing Threshold Amplitude: 0.4 V
Lead Channel Pacing Threshold Amplitude: 1.2 V
Lead Channel Pacing Threshold Pulse Width: 0.4 ms
Lead Channel Pacing Threshold Pulse Width: 0.4 ms
Lead Channel Setting Pacing Amplitude: 2 V
Lead Channel Setting Pacing Amplitude: 2.4 V
Lead Channel Setting Pacing Pulse Width: 0.4 ms
Lead Channel Setting Sensing Sensitivity: 0.6 mV
Pulse Gen Serial Number: 619056
Zone Setting Status: 755011

## 2022-07-28 NOTE — Progress Notes (Signed)
Remote ICD transmission.   

## 2022-09-29 ENCOUNTER — Ambulatory Visit (INDEPENDENT_AMBULATORY_CARE_PROVIDER_SITE_OTHER): Payer: PRIVATE HEALTH INSURANCE

## 2022-09-29 DIAGNOSIS — I428 Other cardiomyopathies: Secondary | ICD-10-CM | POA: Diagnosis not present

## 2022-10-01 LAB — CUP PACEART REMOTE DEVICE CHECK
Battery Remaining Longevity: 156 mo
Battery Remaining Percentage: 100 %
Brady Statistic RA Percent Paced: 61 %
Brady Statistic RV Percent Paced: 5 %
Date Time Interrogation Session: 20240719013100
HighPow Impedance: 78 Ohm
Implantable Lead Connection Status: 753985
Implantable Lead Connection Status: 753985
Implantable Lead Implant Date: 20231016
Implantable Lead Implant Date: 20231016
Implantable Lead Location: 753859
Implantable Lead Location: 753860
Implantable Lead Model: 672
Implantable Lead Model: 7841
Implantable Lead Serial Number: 104965
Implantable Lead Serial Number: 228703
Implantable Pulse Generator Implant Date: 20231016
Lead Channel Impedance Value: 384 Ohm
Lead Channel Impedance Value: 597 Ohm
Lead Channel Pacing Threshold Amplitude: 0.5 V
Lead Channel Pacing Threshold Amplitude: 1.1 V
Lead Channel Pacing Threshold Pulse Width: 0.4 ms
Lead Channel Pacing Threshold Pulse Width: 0.4 ms
Lead Channel Setting Pacing Amplitude: 2 V
Lead Channel Setting Pacing Amplitude: 2.4 V
Lead Channel Setting Pacing Pulse Width: 0.4 ms
Lead Channel Setting Sensing Sensitivity: 0.6 mV
Pulse Gen Serial Number: 619056
Zone Setting Status: 755011

## 2022-10-12 NOTE — Progress Notes (Signed)
Remote ICD transmission.   

## 2022-11-15 ENCOUNTER — Other Ambulatory Visit: Payer: Self-pay | Admitting: Family Medicine

## 2022-11-15 DIAGNOSIS — K5900 Constipation, unspecified: Secondary | ICD-10-CM

## 2022-11-15 DIAGNOSIS — R059 Cough, unspecified: Secondary | ICD-10-CM

## 2022-11-15 DIAGNOSIS — K219 Gastro-esophageal reflux disease without esophagitis: Secondary | ICD-10-CM

## 2022-12-01 ENCOUNTER — Ambulatory Visit
Admission: RE | Admit: 2022-12-01 | Discharge: 2022-12-01 | Disposition: A | Discharge status: Home / Self Care | Payer: Medicare (Managed Care) | Source: Ambulatory Visit | Attending: Family Medicine | Admitting: Family Medicine

## 2022-12-01 ENCOUNTER — Other Ambulatory Visit: Payer: Self-pay | Admitting: Family Medicine

## 2022-12-01 DIAGNOSIS — K219 Gastro-esophageal reflux disease without esophagitis: Secondary | ICD-10-CM | POA: Insufficient documentation

## 2022-12-01 DIAGNOSIS — K5900 Constipation, unspecified: Secondary | ICD-10-CM

## 2022-12-01 DIAGNOSIS — R059 Cough, unspecified: Secondary | ICD-10-CM

## 2022-12-29 ENCOUNTER — Ambulatory Visit (INDEPENDENT_AMBULATORY_CARE_PROVIDER_SITE_OTHER): Payer: Medicare (Managed Care)

## 2022-12-29 DIAGNOSIS — I428 Other cardiomyopathies: Secondary | ICD-10-CM

## 2022-12-29 LAB — CUP PACEART REMOTE DEVICE CHECK
Battery Remaining Longevity: 150 mo
Battery Remaining Percentage: 100 %
Brady Statistic RA Percent Paced: 63 %
Brady Statistic RV Percent Paced: 4 %
Date Time Interrogation Session: 20241018013100
HighPow Impedance: 78 Ohm
Implantable Lead Connection Status: 753985
Implantable Lead Connection Status: 753985
Implantable Lead Implant Date: 20231016
Implantable Lead Implant Date: 20231016
Implantable Lead Location: 753859
Implantable Lead Location: 753860
Implantable Lead Model: 672
Implantable Lead Model: 7841
Implantable Lead Serial Number: 104965
Implantable Lead Serial Number: 228703
Implantable Pulse Generator Implant Date: 20231016
Lead Channel Impedance Value: 394 Ohm
Lead Channel Impedance Value: 562 Ohm
Lead Channel Pacing Threshold Amplitude: 0.5 V
Lead Channel Pacing Threshold Amplitude: 1.1 V
Lead Channel Pacing Threshold Pulse Width: 0.4 ms
Lead Channel Pacing Threshold Pulse Width: 0.4 ms
Lead Channel Setting Pacing Amplitude: 2 V
Lead Channel Setting Pacing Amplitude: 2.4 V
Lead Channel Setting Pacing Pulse Width: 0.4 ms
Lead Channel Setting Sensing Sensitivity: 0.6 mV
Pulse Gen Serial Number: 619056
Zone Setting Status: 755011

## 2023-01-16 NOTE — Progress Notes (Signed)
Remote ICD transmission.   

## 2023-03-30 ENCOUNTER — Ambulatory Visit: Payer: Medicaid Other

## 2023-05-07 ENCOUNTER — Other Ambulatory Visit: Payer: Self-pay | Admitting: Ophthalmology

## 2023-05-07 DIAGNOSIS — H5340 Unspecified visual field defects: Secondary | ICD-10-CM

## 2023-05-11 ENCOUNTER — Ambulatory Visit
Admission: RE | Admit: 2023-05-11 | Discharge: 2023-05-11 | Disposition: A | Discharge status: Home / Self Care | Payer: Medicare (Managed Care) | Source: Ambulatory Visit | Attending: Ophthalmology | Admitting: Ophthalmology

## 2023-05-11 DIAGNOSIS — H5340 Unspecified visual field defects: Secondary | ICD-10-CM | POA: Diagnosis present

## 2023-05-11 MED ORDER — IOHEXOL 300 MG/ML  SOLN
75.0000 mL | Freq: Once | INTRAMUSCULAR | Status: AC | PRN
  Administered 2023-05-11: 75 mL via INTRAVENOUS

## 2023-05-29 ENCOUNTER — Encounter: Payer: Medicare (Managed Care) | Admitting: Pulmonary Disease

## 2023-06-08 ENCOUNTER — Encounter: Payer: Medicare (Managed Care) | Admitting: Cardiology

## 2023-06-29 ENCOUNTER — Ambulatory Visit: Payer: Medicaid Other

## 2023-07-06 ENCOUNTER — Ambulatory Visit (INDEPENDENT_AMBULATORY_CARE_PROVIDER_SITE_OTHER): Payer: Medicare (Managed Care)

## 2023-07-06 DIAGNOSIS — I440 Atrioventricular block, first degree: Secondary | ICD-10-CM

## 2023-07-06 LAB — CUP PACEART REMOTE DEVICE CHECK
Battery Remaining Longevity: 156 mo
Battery Remaining Percentage: 100 %
Brady Statistic RA Percent Paced: 66 %
Brady Statistic RV Percent Paced: 3 %
Date Time Interrogation Session: 20250425020600
HighPow Impedance: 73 Ohm
Implantable Lead Connection Status: 753985
Implantable Lead Connection Status: 753985
Implantable Lead Implant Date: 20231016
Implantable Lead Implant Date: 20231016
Implantable Lead Location: 753859
Implantable Lead Location: 753860
Implantable Lead Model: 672
Implantable Lead Model: 7841
Implantable Lead Serial Number: 104965
Implantable Lead Serial Number: 228703
Implantable Pulse Generator Implant Date: 20231016
Lead Channel Impedance Value: 352 Ohm
Lead Channel Impedance Value: 602 Ohm
Lead Channel Pacing Threshold Amplitude: 0.5 V
Lead Channel Pacing Threshold Amplitude: 1 V
Lead Channel Pacing Threshold Pulse Width: 0.4 ms
Lead Channel Pacing Threshold Pulse Width: 0.4 ms
Lead Channel Setting Pacing Amplitude: 2 V
Lead Channel Setting Pacing Amplitude: 2.2 V
Lead Channel Setting Pacing Pulse Width: 0.4 ms
Lead Channel Setting Sensing Sensitivity: 0.6 mV
Pulse Gen Serial Number: 619056
Zone Setting Status: 755011

## 2023-07-16 ENCOUNTER — Ambulatory Visit: Payer: Medicare (Managed Care) | Admitting: Student

## 2023-07-16 NOTE — Progress Notes (Deleted)
  Electrophysiology Office Note:   ID:  Gerald Hodges, DOB 18-Nov-1935, MRN [id number]  Primary Cardiologist: Sammy Crisp, MD Electrophysiologist: Boyce Byes, MD  {Click to update primary MD,subspecialty MD or APP then REFRESH:1}    History of Present Illness:   Gerald Hodges is a 88 y.o. male with h/o HFrEF, CAD, HTN and VT s/p ICD seen today for routine electrophysiology followup.   Since last being seen in our clinic the patient reports doing ***.  he denies chest pain, palpitations, dyspnea, PND, orthopnea, nausea, vomiting, dizziness, syncope, edema, weight gain, or early satiety.   Review of systems complete and found to be negative unless listed in HPI.   EP Information / Studies Reviewed:    EKG is ordered today. Personal review as below.       ICD Interrogation-  reviewed in detail today,  See PACEART report.  Arrhythmia/Device History BSCi dual chamber ICD implanted 12/26/21   Physical Exam:   VS:  There were no vitals taken for this visit.   Wt Readings from Last 3 Encounters:  03/29/22 183 lb (83 kg)  02/14/22 176 lb (79.8 kg)  01/09/22 175 lb 12.8 oz (79.7 kg)     GEN: No acute distress *** NECK: No JVD; No carotid bruits CARDIAC: {EPRHYTHM:28826}, no murmurs, rubs, gallops RESPIRATORY:  Clear to auscultation without rales, wheezing or rhonchi  ABDOMEN: Soft, non-tender, non-distended EXTREMITIES:  {EDEMA LEVEL:28147::"No"} edema; No deformity   ASSESSMENT AND PLAN:    Ventricular arrhythmia  s/p [employer] dual chamber ICD  euvolemic today Stable on an appropriate medical regimen Normal ICD function See Pace Art report No changes today Continue amiodarone  200 mg daily Surveillance labs today  HFmrEF Echo with EF normal 03/2021 cMRI 05/2021 with LVEF 46% NYHA *** symptoms Continue losartan , imdur , lasix , and coreg   CAD Mild, non-obstructive by cath 03/2021  Disposition:   Follow up with {EPPROVIDERS:28135}  {EPFOLLOW UP:28173}   Signed, Tylene Galla, PA-C

## 2023-07-17 NOTE — Progress Notes (Unsigned)
  Electrophysiology Office Follow up Visit Note:    Date:  07/18/2023   ID:  Gerald Hodges, DOB 04/06/1935, MRN [id number]  PCP:  Janella Median, NP (Inactive)  [facility] Cardiologist:  Sammy Crisp, MD  [facility] Electrophysiologist:  Boyce Byes, MD    Interval History:     Gerald Hodges is a 88 y.o. male who presents for a follow up visit.   I last saw the patient March 29, 2022.  He has a history of an ICD that was implanted in October 2023 for ventricular tachycardia.        Past medical, surgical, social and family history were reviewed.  ROS:   Please see the history of present illness.    All other systems reviewed and are negative.  EKGs/Labs/Other Studies Reviewed:    The following studies were reviewed today:  Jul 18, 2023 in-clinic device interrogation personally reviewed Battery and lead parameter stable No high-voltage therapies Heart logic 0 66% atrial paced 3% ventricular Presenting rhythm a sensed, V sensed          Physical Exam:    VS:  BP (!) 144/70 (BP Location: Right Arm, Patient Position: Sitting, Cuff Size: Normal)   Pulse 64   Ht 5\' 6"  (1.676 m)   Wt 175 lb (79.4 kg)   SpO2 97%   BMI 28.25 kg/m     Wt Readings from Last 3 Encounters:  07/18/23 175 lb (79.4 kg)  03/29/22 183 lb (83 kg)  02/14/22 176 lb (79.8 kg)     GEN: no distress CARD: RRR, No MRG.  CIED pocket well-healed RESP: No IWOB. CTAB.      ASSESSMENT:    1. NICM (nonischemic cardiomyopathy) (HCC)   2. Chronic systolic congestive heart failure (HCC)   3. VT (ventricular tachycardia) (HCC)   4. Primary hypertension   5. ICD (implantable cardioverter-defibrillator) in place    PLAN:    In order of problems listed above:  #Ventricular tachycardia #ICD in situ #High risk med monitoring-amiodarone  Electrically quiescent on amiodarone  and mexiletine. Update CMP, TSH and free T4 today  ICD functioning appropriately.   Continue remote monitoring  #Hypertension Above goal today.  Recommend checking blood pressures 1-2 times per week at home and recording the values.  Recommend bringing these recordings to the primary care physician.   Follow-up 6 months with EP APP.  Signed, Harvie Liner, MD, [facility], [facility] 07/18/2023 11:15 AM    Electrophysiology [facility]

## 2023-07-18 ENCOUNTER — Ambulatory Visit: Payer: Medicare (Managed Care) | Attending: Cardiology | Admitting: Cardiology

## 2023-07-18 ENCOUNTER — Encounter: Payer: Self-pay | Admitting: Cardiology

## 2023-07-18 ENCOUNTER — Other Ambulatory Visit: Payer: Self-pay

## 2023-07-18 VITALS — BP 144/70 | HR 64 | Ht 66.0 in | Wt 175.0 lb

## 2023-07-18 DIAGNOSIS — I428 Other cardiomyopathies: Secondary | ICD-10-CM

## 2023-07-18 DIAGNOSIS — I10 Essential (primary) hypertension: Secondary | ICD-10-CM

## 2023-07-18 DIAGNOSIS — I472 Ventricular tachycardia, unspecified: Secondary | ICD-10-CM

## 2023-07-18 DIAGNOSIS — I5022 Chronic systolic (congestive) heart failure: Secondary | ICD-10-CM | POA: Diagnosis not present

## 2023-07-18 DIAGNOSIS — Z95810 Presence of automatic (implantable) cardiac defibrillator: Secondary | ICD-10-CM

## 2023-07-18 NOTE — Patient Instructions (Signed)
 Medication Instructions:  Your physician recommends that you continue on your current medications as directed. Please refer to the Current Medication list given to you today.  *If you need a refill on your cardiac medications before your next appointment, please call your pharmacy*  Lab Work: TODAY: CMET, TSH, T4   Follow-Up: At [facility], you and your health needs are our priority.  As part of our continuing mission to provide you with exceptional heart care, our providers are all part of one team.  This team includes your primary Cardiologist (physician) and Advanced Practice Providers or APPs (Physician Assistants and Nurse Practitioners) who all work together to provide you with the care you need, when you need it.  Your next appointment:   6 months  Provider:   You will see one of the following Advanced Practice Providers on your designated Care Team:   Mertha Abrahams, Kennard Pea [address] [place], PA-C Suzann Riddle, NP Creighton Doffing, NP

## 2023-07-19 LAB — COMPREHENSIVE METABOLIC PANEL WITH GFR
ALT: 20 IU/L (ref 0–44)
AST: 26 IU/L (ref 0–40)
Albumin: 4.1 g/dL (ref 3.7–4.7)
Alkaline Phosphatase: 98 IU/L (ref 44–121)
BUN/Creatinine Ratio: 17 (ref 10–24)
BUN: 24 mg/dL (ref 8–27)
Bilirubin Total: 1.6 mg/dL — ABNORMAL HIGH (ref 0.0–1.2)
CO2: 22 mmol/L (ref 20–29)
Calcium: 9.6 mg/dL (ref 8.6–10.2)
Chloride: 101 mmol/L (ref 96–106)
Creatinine, Ser: 1.45 mg/dL — ABNORMAL HIGH (ref 0.76–1.27)
Globulin, Total: 2.5 g/dL (ref 1.5–4.5)
Glucose: 95 mg/dL (ref 70–99)
Potassium: 4.9 mmol/L (ref 3.5–5.2)
Sodium: 138 mmol/L (ref 134–144)
Total Protein: 6.6 g/dL (ref 6.0–8.5)
eGFR: 47 mL/min/{1.73_m2} — ABNORMAL LOW (ref >59)

## 2023-07-19 LAB — TSH: TSH: 3.94 u[IU]/mL (ref 0.450–4.500)

## 2023-07-19 LAB — T4, FREE: Free T4: 1.61 ng/dL (ref 0.82–1.77)

## 2023-07-23 LAB — CUP PACEART INCLINIC DEVICE CHECK
Date Time Interrogation Session: 20250507084615
Implantable Lead Connection Status: 753985
Implantable Lead Connection Status: 753985
Implantable Lead Implant Date: 20231016
Implantable Lead Implant Date: 20231016
Implantable Lead Location: 753859
Implantable Lead Location: 753860
Implantable Lead Model: 672
Implantable Lead Model: 7841
Implantable Lead Serial Number: 104965
Implantable Lead Serial Number: 228703
Implantable Pulse Generator Implant Date: 20231016
Pulse Gen Serial Number: 619056

## 2023-07-24 ENCOUNTER — Encounter: Payer: Self-pay | Admitting: Cardiology

## 2023-07-29 ENCOUNTER — Ambulatory Visit: Payer: Self-pay | Admitting: Cardiology

## 2023-07-31 ENCOUNTER — Encounter: Payer: Medicare (Managed Care) | Admitting: Cardiology

## 2023-08-07 ENCOUNTER — Other Ambulatory Visit
Admission: RE | Admit: 2023-08-07 | Discharge: 2023-08-07 | Disposition: A | Discharge status: Home / Self Care | Payer: Medicare (Managed Care) | Attending: Orthopedic Surgery | Admitting: Orthopedic Surgery

## 2023-08-07 DIAGNOSIS — M25562 Pain in left knee: Secondary | ICD-10-CM | POA: Insufficient documentation

## 2023-08-07 LAB — SYNOVIAL CELL COUNT + DIFF, W/ CRYSTALS
Crystals, Fluid: NONE SEEN
Eosinophils-Synovial: 0 %
Lymphocytes-Synovial Fld: 0 %
Monocyte-Macrophage-Synovial Fluid: 2 %
Neutrophil, Synovial: 98 %
WBC, Synovial: 16497 /mm3 — ABNORMAL HIGH (ref 0–200)

## 2023-08-13 NOTE — Progress Notes (Signed)
 Remote ICD transmission.

## 2023-08-14 ENCOUNTER — Other Ambulatory Visit: Payer: Self-pay | Admitting: Orthopedic Surgery

## 2023-08-14 ENCOUNTER — Telehealth: Payer: Self-pay | Admitting: *Deleted

## 2023-08-14 NOTE — Telephone Encounter (Signed)
   Pre-operative Risk Assessment    Patient Name: Gerald Hodges  DOB: Sep 24, 1935 MRN: [id number]   Date of last office visit: 07/18/23 DR. LAMBERT Date of next office visit: NONE   Request for Surgical Clearance    Procedure:  LEFT  TOTAL KNEE REVISION DUE TO INFECTION  Date of Surgery:  Clearance 08/20/23 URGENT                               Surgeon:  DR. ABERMAN Surgeon's Group or Practice Name:  [facility] Phone number:  [phone] Fax number:  [phone]   Type of Clearance Requested:   - Medical  - Pharmacy:  Hold Clopidogrel  (Plavix )     Type of Anesthesia:  CHOICE   Additional requests/questions:    [place] Broom   08/14/2023, 3:59 PM

## 2023-08-14 NOTE — Telephone Encounter (Signed)
 Called and spoke to patient's son to schedule patient for a preop clearance appointment per preop APP Katlyn West, NP patient has been scheduled for 6/5 at our [facility] location

## 2023-08-14 NOTE — Progress Notes (Signed)
 PERIOPERATIVE PRESCRIPTION FOR IMPLANTED CARDIAC DEVICE PROGRAMMING   Patient Information:  Patient: Gerald Hodges  MRN: [id number]  Date of Birth: 1935/10/09   Procedure:  LEFT  TOTAL KNEE REVISION DUE TO INFECTION Date of Surgery:  Clearance 08/20/23 URGENT                             Surgeon:  DR. ABERMAN Surgeon's Group or Practice Name:  [facility] Phone number:  [phone] Fax number:  [phone]   Device Information:   Clinic EP Physician:   Dr. Harvie Liner Device Type:  Defibrillator Manufacturer and Phone #:  [employer]: [phone] Pacemaker Dependent?:  Unknown Date of Last Device Check:  07/18/2023         Normal Device Function?:  Yes     Electrophysiologist's Recommendations:   Have magnet available. Provide continuous ECG monitoring when magnet is used or reprogramming is to be performed.  Procedure should not interfere with device function.  No device programming or magnet placement needed.  Per Device Clinic Standing Orders, [name]  08/14/2023 4:43 PM

## 2023-08-14 NOTE — Telephone Encounter (Signed)
 I have completed the device clearance and sent back over to the pool you requested as well as [facility].

## 2023-08-16 ENCOUNTER — Ambulatory Visit
Admission: RE | Admit: 2023-08-16 | Discharge: 2023-08-16 | Disposition: A | Discharge status: Home / Self Care | Payer: Medicare (Managed Care) | Source: Ambulatory Visit | Attending: Cardiology | Admitting: Cardiology

## 2023-08-16 ENCOUNTER — Other Ambulatory Visit: Payer: Self-pay

## 2023-08-16 ENCOUNTER — Encounter: Payer: Self-pay | Admitting: Cardiology

## 2023-08-16 ENCOUNTER — Encounter
Admission: RE | Admit: 2023-08-16 | Discharge: 2023-08-16 | Disposition: A | Discharge status: Home / Self Care | Payer: Medicare (Managed Care) | Source: Ambulatory Visit | Attending: Cardiology | Admitting: Cardiology

## 2023-08-16 ENCOUNTER — Ambulatory Visit: Payer: Medicare (Managed Care) | Attending: Cardiology | Admitting: Cardiology

## 2023-08-16 VITALS — BP 145/86 | HR 70 | Temp 97.8°F | Resp 12 | Ht 63.0 in | Wt 173.4 lb

## 2023-08-16 VITALS — BP 140/80 | HR 68 | Ht 63.0 in | Wt 171.4 lb

## 2023-08-16 DIAGNOSIS — Z603 Acculturation difficulty: Secondary | ICD-10-CM

## 2023-08-16 DIAGNOSIS — I10 Essential (primary) hypertension: Secondary | ICD-10-CM

## 2023-08-16 DIAGNOSIS — M5489 Other dorsalgia: Secondary | ICD-10-CM | POA: Insufficient documentation

## 2023-08-16 DIAGNOSIS — E785 Hyperlipidemia, unspecified: Secondary | ICD-10-CM

## 2023-08-16 DIAGNOSIS — Z01818 Encounter for other preprocedural examination: Secondary | ICD-10-CM

## 2023-08-16 DIAGNOSIS — G8929 Other chronic pain: Secondary | ICD-10-CM | POA: Insufficient documentation

## 2023-08-16 DIAGNOSIS — I428 Other cardiomyopathies: Secondary | ICD-10-CM

## 2023-08-16 DIAGNOSIS — I5032 Chronic diastolic (congestive) heart failure: Secondary | ICD-10-CM

## 2023-08-16 DIAGNOSIS — I25118 Atherosclerotic heart disease of native coronary artery with other forms of angina pectoris: Secondary | ICD-10-CM | POA: Diagnosis not present

## 2023-08-16 DIAGNOSIS — I472 Ventricular tachycardia, unspecified: Secondary | ICD-10-CM | POA: Diagnosis not present

## 2023-08-16 DIAGNOSIS — Z758 Other problems related to medical facilities and other health care: Secondary | ICD-10-CM

## 2023-08-16 DIAGNOSIS — N1832 Chronic kidney disease, stage 3b: Secondary | ICD-10-CM

## 2023-08-16 DIAGNOSIS — Z95810 Presence of automatic (implantable) cardiac defibrillator: Secondary | ICD-10-CM

## 2023-08-16 HISTORY — DX: Diverticulosis of intestine, part unspecified, without perforation or abscess without bleeding: K57.90

## 2023-08-16 HISTORY — DX: Presence of external hearing-aid: Z97.4

## 2023-08-16 HISTORY — DX: Prediabetes: R73.03

## 2023-08-16 HISTORY — DX: Cyst of kidney, acquired: N28.1

## 2023-08-16 HISTORY — DX: Other cardiomyopathies: I42.8

## 2023-08-16 HISTORY — DX: Unilateral inguinal hernia, without obstruction or gangrene, not specified as recurrent: K40.90

## 2023-08-16 HISTORY — DX: Supraventricular tachycardia, unspecified: I47.10

## 2023-08-16 HISTORY — DX: Infection and inflammatory reaction due to other internal orthopedic prosthetic devices, implants and grafts, initial encounter: T84.7XXA

## 2023-08-16 HISTORY — DX: Benign prostatic hyperplasia without lower urinary tract symptoms: N40.0

## 2023-08-16 HISTORY — DX: Atrial premature depolarization: I49.1

## 2023-08-16 HISTORY — DX: Other cerebrovascular disease: I67.89

## 2023-08-16 HISTORY — DX: Presence of automatic (implantable) cardiac defibrillator: Z95.810

## 2023-08-16 HISTORY — DX: Bilateral inguinal hernia, without obstruction or gangrene, not specified as recurrent: K40.20

## 2023-08-16 LAB — SURGICAL PCR SCREEN
MRSA, PCR: NEGATIVE
Staphylococcus aureus: NEGATIVE

## 2023-08-16 LAB — URINALYSIS, ROUTINE W REFLEX MICROSCOPIC
Bacteria, UA: NONE SEEN
Bilirubin Urine: NEGATIVE
Glucose, UA: NEGATIVE mg/dL
Hgb urine dipstick: NEGATIVE
Ketones, ur: NEGATIVE mg/dL
Leukocytes,Ua: NEGATIVE
Nitrite: NEGATIVE
Protein, ur: 30 mg/dL — AB
Specific Gravity, Urine: 1.018 (ref 1.005–1.030)
pH: 5 (ref 5.0–8.0)

## 2023-08-16 LAB — CBC WITH DIFFERENTIAL/PLATELET
Abs Immature Granulocytes: 0.03 10*3/uL (ref 0.00–0.07)
Basophils Absolute: 0 10*3/uL (ref 0.0–0.1)
Basophils Relative: 1 %
Eosinophils Absolute: 0.3 10*3/uL (ref 0.0–0.5)
Eosinophils Relative: 5 %
HCT: 40.3 % (ref 39.0–52.0)
Hemoglobin: 13.6 g/dL (ref 13.0–17.0)
Immature Granulocytes: 1 %
Lymphocytes Relative: 22 %
Lymphs Abs: 1.3 10*3/uL (ref 0.7–4.0)
MCH: 31.1 pg (ref 26.0–34.0)
MCHC: 33.7 g/dL (ref 30.0–36.0)
MCV: 92 fL (ref 80.0–100.0)
Monocytes Absolute: 0.8 10*3/uL (ref 0.1–1.0)
Monocytes Relative: 14 %
Neutro Abs: 3.4 10*3/uL (ref 1.7–7.7)
Neutrophils Relative %: 57 %
Platelets: 538 10*3/uL — ABNORMAL HIGH (ref 150–400)
RBC: 4.38 MIL/uL (ref 4.22–5.81)
RDW: 12.5 % (ref 11.5–15.5)
WBC: 5.9 10*3/uL (ref 4.0–10.5)
nRBC: 0 % (ref 0.0–0.2)

## 2023-08-16 LAB — TYPE AND SCREEN
ABO/RH(D): O POS
Antibody Screen: NEGATIVE

## 2023-08-16 NOTE — Progress Notes (Signed)
 Cardiology Office Note   Date:  08/16/2023  ID:  Gerald Hodges, DOB 1935-06-27, MRN [id number] PCP: Janella Median, NP (Inactive)  [facility] Providers Cardiologist:  Sammy Crisp, MD Electrophysiologist:  Boyce Byes, MD     History of Present Illness Gerald Hodges is a 88 y.o. male with a past medical history of nonobstructive coronary artery disease by left heart catheterization (03/2021) VT (03/2021), HFpEF, TIA, CKD stage IIIb, hypertension, hyperlipidemia, dizziness with possible vertigo, and GERD, who presents today for follow-up.   Prior Myoview  completed 10/2016 at [facility] was without significant ischemia with a small in size several in severity, fixed mild inferior lateral and basal defect consistent with possible artifact but cannot rule out septal scar, EF 56%, no significant coronary artery calcification.  Study was low risk and unchanged when compared to prior study in 09/2012.  Echocardiogram completed in 11/2017 showed an EF of 55 to 60%, mild LVH, moderate focal basal hypertrophy, G1 DD, mild aortic valve thickening with mild regurgitation, normal RV size and function.  He was evaluated in 11/2017 he noted some left leg pain/paresthesias and underwent lower extremity ABIs that were normal bilaterally.  He was evaluated 10/2018 in the [facility] emergency department with COVID-19 pneumonia.  He has been noted to have subtle chronically exertional dyspnea over the years.  Amlodipine  has previously been decreased due to positional dizziness and lower extremity swelling.  He was scheduled for Lexiscan  Myoview  on 11/2019 for surgical clearance that was completed in 12/2019 that showed no significant ischemia with a small to moderate size region of predominantly fixed defect in the inferior lateral wall that was challenging to interpret in the setting of GI uptake, artifact, EF 42%.  CT attenuation correction images were with very mild aortic atherosclerosis and no notable  coronary artery calcification.  Overall low risk study.  Echocardiogram in 12/2019 showed an EF of 55 to 60%, no RWMA, mild LVH, G2 DD, normal RV systolic function and ventricular cavity size, mild MR, and mild AI.  Based on the studies he was felt to be acceptable for surgery.  He was seen in 05/2020 noting body aches and fatigue as well as worsening exertional dyspnea and chest pain.  He has been taking Imdur  30 mg (previously recommended 90 mg.  He preferred to reinitiate Imdur  90 mg initially and reserve left heart catheterization for refractory symptoms.  He underwent robotic umbilical hernia repair on 12/14/2020.  His presurgical follow-up with the surgeon on 01/01/2019 provide states he has incisions about the umbilicus.  There is no evidence of hernia recurrence.  He was advised to try Pepcid  Prilosec for some of his discomfort.  He is 5/22 for positional dizziness and vertigo symptoms.  He was without anginal equivalents.  Subsequent outpatient heart monitoring/2022 showed appropriate sinus rhythm 75 beats per single episode of NSVT lasting 7 beats with a maximum rate of 107 bpm, atrial flutter lasting up to 18 beats with a max of 36 bpm.  Positive for PACs and PVCs with 3% burden.  Discussion of beta-blocker was deferred given possible side effects).  Subsequent echo 12/22 demonstrated EF 60%, no RWMA, G1 DD, normal RV systolic function and ventricular cavity size, mild mitral regurgitation.  Hospitalized from 1/21-1/25/2023 for VT requiring cardioversion.  Initial high-sensitivity troponin of 19 with a delta and peak troponin of 4508.  Upon presentation magnesium  was noted to be 1.7 with potassium 4.5.  BNP 179.  Echo showed EF 55 to 60%, no RWMA, mild LVH, G1 DD,  normal RV systolic function and ventricular cavity size, moderate mitral regurgitation, mild to moderate tricuspid regurgitation.  Left heart catheterization on 04/04/2020 demonstrated superdominant RCA with relatively small LAD.  LAD had  moderate mid stenosis.  Overall there was no evidence of obstructive CAD there was mildly to moderately elevated LVEDP estimated at 22 mmHg.  There was no evidence of obstructive CAD to explain the patient's VT.  Elevated troponin was felt to be related to supply/demand mismatch in the setting of VT requiring cardioversion.  EP was consulted with recommendations for oral amiodarone  loaded followed by outpatient EP follow-up.  He was admitted to [facility] 12/22/2021.  He had been experiencing intermittent presyncope in the absence of chest pain, dizzy or palpitations.  At approximately 1 AM in the morning he alerted his son he was having mild chest pressure dyspnea and diaphoresis.  This had persisted throughout the night he took sublingual nitroglycerin  around 5 or 6 AM without relief.  Around 9 AM they took him to his primary care provider's office and EMS was called due to wide tunnel complex tachycardia.  ER notes indicate that there may have been an episode of unresponsiveness however the patient denied this.  ER notes also indicated the patient was hypoxic at his primary care office with oxygen saturations of 65% on room air and was eventually placed on a nonrebreather.  Once arrived into the emergency department he was noted to be in a wide-complex tachycardia/ventricular tachycardia.  He was placed on IV amiodarone  at this point and received 3 separate 150 mg amiodarone  boluses.  Heart rates had been trending in the 140s 150s and the patient was asymptomatic at the time.  He remained hemodynamically stable and lab work was unremarkable with the exception of mild troponin elevation at 216.  He was transferred to [facility] under the care of of electrophysiology.  On 12/26/2021 he underwent the implantation of a [employer] dual-chamber ICD by Dr. Marven Slimmer.  He had eventually converted to normal sinus rhythm after being continued on amiodarone  therapy.  He was also briefly on lidocaine .   It was felt his VT would be amendable to ATP therapy.  He was loaded on IV amiodarone  and transition to p.o. dosing for home.  He was considered stable for discharge and was able to be discharged home on 12/27/2021.   He was last seen in clinic 07/18/2023 by Dr. Marven Slimmer.  At time he was doing well from the cardiac perspective.  His device was functioning appropriately and he was to continue with remote monitoring.  Blood pressure was slightly elevated and he recommended checking blood pressure 1-2 times per week at home and recording the values.  Recommend bringing readings to his follow-up with his primary care provider.  There were no medication changes that were made at the time.  He returns to clinic today for preoperative cardiovascular examination for upcoming surgery for total knee revision on 08/20/2023.  Information was obtained by the patient's family and the patient using interpreter Rosanne Commodore [phone].  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  Patient complains of discomfort and swelling to the left leg around the knee.  He denies any chest pain or shortness of breath.  He is complaining of back pain today during exam.  He states that he has been compliant with his current medications as they are pill  packed with no adverse side effects.  There are several medications that previously were on his list that are no longer in his pill pack.  Patient and family is unaware of any changes made to his medications.  He states that he has had decreased appetite and weight loss.  He has been suffering from fevers, chills, and night sweats on occasion.  This is all happened since the issues with his knee.  He denies any hospitalizations or visits to the emergency department.  ROS: 10 point review of systems has been reviewed and considered negative with exception was been listed in the  HPI  Studies Reviewed EKG Interpretation Date/Time:  Thursday August 16 2023 10:30:47 EDT Ventricular Rate:  68 PR Interval:  256 QRS Duration:  76 QT Interval:  424 QTC Calculation: 450 R Axis:   -16  Text Interpretation: Sinus rhythm with 1st degree A-V block Minimal voltage criteria for LVH, may be normal variant ( R in aVL ) Inferior infarct (cited on or before 16-Aug-2023) When compared with ECG of 18-Jul-2023 11:10, No significant change was found Confirmed by Ronald Cockayne ([id number]) on 08/16/2023 10:32:27 AM    LHC 04/04/2021:   Prox RCA lesion is 20% stenosed.   Dist RCA lesion is 20% stenosed.   Mid LAD lesion is 40% stenosed.   1.  Superdominant right coronary artery with relatively small LAD.  The LAD has moderate mid stenosis.  Overall, no evidence of obstructive coronary artery disease. 2.  Left ventricular angiography was not performed due to chronic kidney disease.  EF was normal by echo. 3.  Mildly to moderately elevated left ventricular end-diastolic pressure at 22 mmHg.   Recommendations: no obstructive coronary artery disease to explain ventricular tachycardia.  Elevated troponin is likely due to supply demand ischemia in the setting of sustained ventricular tachycardia requiring cardioversion. I consulted EP for management of this patient.   2D echo 04/03/2021: 1. Left ventricular ejection fraction, by estimation, is 55 to 60%. The  left ventricle has normal function. The left ventricle has no regional  wall motion abnormalities. There is mild left ventricular hypertrophy.  Left ventricular diastolic parameters  are consistent with Grade I diastolic dysfunction (impaired relaxation).   2. Right ventricular systolic function is normal. The right ventricular  size is normal. There is mildly elevated pulmonary artery systolic  pressure. The estimated right ventricular systolic pressure is 38.4 mmHg.   3. Left atrial size was mildly dilated.   4. The mitral valve is  normal in structure. Moderate mitral valve  regurgitation. No evidence of mitral stenosis.   5. Tricuspid valve regurgitation is mild to moderate.   6. The aortic valve was not well visualized. Aortic valve regurgitation  is mild. No aortic stenosis is present.   7. The inferior vena cava is normal in size with greater than 50%  respiratory variability, suggesting right atrial pressure of 3 mmHg.   2D echo 02/15/2021: 1. Left ventricular ejection fraction, by estimation, is 60 to 65%. The  left ventricle has normal function. The left ventricle has no regional  wall motion abnormalities. Left ventricular diastolic parameters are  consistent with Grade I diastolic  dysfunction (impaired relaxation).   2. Right ventricular systolic function is normal. The right ventricular  size is normal. There is normal pulmonary artery systolic pressure. The  estimated right ventricular systolic pressure is 35.7 mmHg.   3. Left atrial size was mildly dilated.   4. The mitral valve is normal in structure. Mild mitral  valve  regurgitation. No evidence of mitral stenosis.   5. The aortic valve was not well visualized. Aortic valve regurgitation  is mild. No aortic stenosis is present.   6. The inferior vena cava is normal in size with <50% respiratory  variability, suggesting right atrial pressure of 8 mmHg.   Zio patch 01/2021: The patient was monitored for 13 days, 12 hours. The predominant rhythm was sinus with an average rate of 75 bpm (range 51-123 bpm in sinus). There were rare PACs and occasional PVCs (3% burden). A single episode of nonsustained ventricular tachycardia was observed, lasting 7 beats with a maximum rate of 171 bpm. There were 11 atrial runs lasting up to 18 beats, with a maximum rate of 136 bpm. No sustained arrhythmia or prolonged pause was observed. Patient triggered events corresponded to sinus rhythm and PVCs.   Predominantly sinus rhythm with rare PACs and occasional PVCs, as  well as a few runs of NSVT and PSVT.   2D echo 12/17/2019: 1. Left ventricular ejection fraction, by estimation, is 55 to 60%. The  left ventricle has normal function. The left ventricle has no regional  wall motion abnormalities. There is mild left ventricular hypertrophy.  Left ventricular diastolic parameters  are consistent with Grade II diastolic dysfunction (pseudonormalization).   2. Right ventricular systolic function is normal. The right ventricular  size is normal.   3. The mitral valve is normal in structure. Mild mitral valve  regurgitation.   4. The aortic valve is tricuspid. Aortic valve regurgitation is mild.   5. The inferior vena cava is dilated in size with >50% respiratory  variability, suggesting right atrial pressure of 8 mmHg.   Lexiscan  MPI 12/12/2019: Pharmacological myocardial perfusion imaging study with no significant  Ischemia Small to moderate-sized region of predominantly fixed defect in the inferolateral wall , challenging to interpret in the setting of GI uptake artifact Normal wall motion, EF estimated at 42% (possibly depressed secondary to artifact) No EKG changes concerning for ischemia at peak stress or in recovery. CT attenuation correction images with very mild aortic atherosclerosis, no notable coronary calcification Low risk scan Risk Assessment/Calculations       Physical Exam VS:  BP (!) 140/80 (BP Location: Left Arm)   Pulse 68   Ht 5\' 3"  (1.6 m)   Wt 171 lb 6.4 oz (77.7 kg)   SpO2 94%   BMI 30.36 kg/m    Wt Readings from Last 3 Encounters:  08/16/23 173 lb 6.4 oz (78.7 kg)  08/16/23 171 lb 6.4 oz (77.7 kg)  07/18/23 175 lb (79.4 kg)    GEN: Well nourished, well developed in no acute distress NECK: No JVD; No carotid bruits CARDIAC: RRR, no murmurs, rubs, gallops RESPIRATORY:  Clear to auscultation without rales, wheezing or rhonchi  ABDOMEN: Soft, non-tender, non-distended EXTREMITIES:  No edema; No deformity   ASSESSMENT AND  PLAN Nonobstructive coronary artery disease with no concerning symptoms for anginal or anginal equivalents.  Left heart catheterization completed in 2023 showed no evidence of obstructive coronary artery disease.  He remains on clopidogrel  75 mg daily carvedilol  3.125 mg twice daily losartan  25 mg daily and Imdur  120 mg daily.  Previously had been on rosuvastatin  3 days a week on Monday Wednesday and Friday that medication is fallen off of his medication list and will be restarted.  EKG today reveals sinus rhythm with a first-degree AV block, LVH, and old inferior infarct.  With no symptoms or concerns for decompensation no further ischemic  evaluation is needed at this time.  Previous episodes of sustained VT where he has undergone ICD insertion and continues to be followed by EP.  He is continued on amiodarone  200 mg daily and carvedilol  3.125 mg twice daily.  He has had no reoccurrence of VT and has not had to be shocked by his device.  Recent labs have remained stable.  He will continue with remote uploads and follow-up with EP.  Chronic HFpEF/NICM he denies any shortness of breath.  He appears to be euvolemic on exam and is well compensated.  He does continue to suffer from NYHA class I-II symptoms.  He is continued on carvedilol  3.25 mg twice daily, furosemide  20 mg daily, losartan  25 mg daily.  New medication additions have been deferred today with upcoming surgery.  Will reevaluate on return and escalate GDMT as tolerated by blood pressure and kidney function.  Primary hypertension with a blood pressure today 140/80.  Patient has not had his medications as of yet today.  Blood pressures previously been stable.  He has slightly anxious as well.  He is continued on carvedilol , furosemide , Imdur , and losartan .  He has been encouraged to monitor blood pressures 1 to 2 hours postmedication administration as well.  Mixed hyperlipidemia with last LDL 78 and 04/04/2021.  He will be restarted on rosuvastatin   10 mg 3 days a week will need updated lipid and hepatic panel in 3 months.  CKD stage IIIb was last seen creatinine 1.45.  Kidney function remains stable.  Avoid NSAIDs continues to be recommendation.  Continued on current medication regimen without changes made today.  Back pain that he states has worsened over the last several weeks.  He denies coughing or shortness of breath.  With upcoming surgery he is being sent for a two-view chest x-ray to rule out pneumonia or lung issues prior to surgery.  Language barrier with mobile interpreter used today.  Preoperative cardiovascular examination with ACC/AHA guidelines no further cardiovascular testing is needed.      Mr. Elison perioperative risk of a major cardiac event is 6.6% according to the Revised Cardiac Risk Index (RCRI).  Therefore, he is at high risk for perioperative complications.   His functional capacity is fair at 4.64 METs according to the Duke Activity Status Index (DASI). Recommendations: According to ACC/AHA guidelines, no further cardiovascular testing needed.  The patient may proceed to surgery at acceptable risk.       Dispo: Patient return to clinic to see MD/APP in 3 months or sooner if needed for reevaluation of symptoms.  Signed, Janell Keeling, NP

## 2023-08-16 NOTE — Patient Instructions (Addendum)
 Your procedure is scheduled on:08-20-23 Monday Report to the Registration Desk on the 1st floor of the Medical Mall.Then proceed to the 2nd floor Surgery Desk To find out your arrival time, please call [phone] between 1PM - 3PM on:08-17-23 Friday If your arrival time is 6:00 am, do not arrive before that time as the Medical Mall entrance doors do not open until 6:00 am.  REMEMBER: Instructions that are not followed completely may result in serious medical risk, up to and including death; or upon the discretion of your surgeon and anesthesiologist your surgery may need to be rescheduled.  Do not eat food after midnight the night before surgery.  No gum chewing or hard candies.  You may however, drink CLEAR liquids up to 2 hours before you are scheduled to arrive for your surgery. Do not drink anything within 2 hours of your scheduled arrival time.  Clear liquids include: - water  - apple juice without pulp - gatorade (not RED colors) - black coffee or tea (Do NOT add milk or creamers to the coffee or tea) Do NOT drink anything that is not on this list.  In addition, your doctor has ordered for you to drink the provided:  Ensure Pre-Surgery Clear Carbohydrate Drink  Drinking this carbohydrate drink up to two hours before surgery helps to reduce insulin  resistance and improve patient outcomes. Please complete drinking 2 hours before scheduled arrival time.  One week prior to surgery:Stop NOW (08-16-23) Stop Anti-inflammatories (NSAIDS) such as Advil, Aleve, Ibuprofen, Motrin, Naproxen, Naprosyn and Aspirin  based products such as Excedrin, Goody's Powder, BC Powder. Stop ANY OVER THE COUNTER supplements until after surgery (Calcium -Vitamin D , Magnesium )  You may however, continue to take Tylenol  if needed for pain up until the day of surgery.  Last dose of clopidogrel  (PLAVIX ) was on 08-14-23 Tuesday  Continue taking all of your other prescription medications up until the day of  surgery.  ON THE DAY OF SURGERY ONLY TAKE THESE MEDICATIONS WITH SIPS OF WATER: -amiodarone  (PACERONE )  -carvedilol  (COREG )  -levothyroxine  (SYNTHROID )   No Alcohol for 24 hours before or after surgery.  No Smoking including e-cigarettes for 24 hours before surgery.  No chewable tobacco products for at least 6 hours before surgery.  No nicotine patches on the day of surgery.  Do not use any "recreational" drugs for at least a week (preferably 2 weeks) before your surgery.  Please be advised that the combination of cocaine and anesthesia may have negative outcomes, up to and including death. If you test positive for cocaine, your surgery will be cancelled.  On the morning of surgery brush your teeth with toothpaste and water, you may rinse your mouth with mouthwash if you wish. Do not swallow any toothpaste or mouthwash.  Use CHG Soap as directed on instruction sheet.  Do not wear jewelry, make-up, hairpins, clips or nail polish.  For welded (permanent) jewelry: bracelets, anklets, waist bands, etc.  Please have this removed prior to surgery.  If it is not removed, there is a chance that hospital personnel will need to cut it off on the day of surgery.  Do not wear lotions, powders, or perfumes.   Do not shave body hair from the neck down 48 hours before surgery.  Contact lenses, hearing aids and dentures may not be worn into surgery.  Do not bring valuables to the hospital. [facility] is not responsible for any missing/lost belongings or valuables.   Notify your doctor if there is any change in  your medical condition (cold, fever, infection).  Wear comfortable clothing (specific to your surgery type) to the hospital.  After surgery, you can help prevent lung complications by doing breathing exercises.  Take deep breaths and cough every 1-2 hours. Your doctor may order a device called an Incentive Spirometer to help you take deep breaths. When coughing or sneezing, hold a  pillow firmly against your incision with both hands. This is called "splinting." Doing this helps protect your incision. It also decreases belly discomfort.  If you are being admitted to the hospital overnight, leave your suitcase in the car. After surgery it may be brought to your room.  In case of increased patient census, it may be necessary for you, the patient, to continue your postoperative care in the Same Day Surgery department.  If you are being discharged the day of surgery, you will not be allowed to drive home. You will need a responsible individual to drive you home and stay with you for 24 hours after surgery.   If you are taking public transportation, you will need to have a responsible individual with you.  Please call the Pre-admissions Testing Dept. at [phone] if you have any questions about these instructions.  Surgery Visitation Policy:  Patients having surgery or a procedure may have two visitors.  Children under the age of 62 must have an adult with them who is not the patient.  Inpatient Visitation:    Visiting hours are 7 a.m. to 8 p.m. Up to four visitors are allowed at one time in a patient room. The visitors may rotate out with other people during the day.  One visitor age 51 or older may stay with the patient overnight and must be in the room by 8 p.m.   u procedimiento est programado para el lunes 25 de junio de 2020. Presntese en el mostrador de registro, ubicado en Information systems manager del [employer] Luego, dirjase al [employer] de [place], ubicado en el [employer]. Para conocer su hora de llegada, llame al [phone] entre la 1 p. m. y las 3 p. m. el viernes 25 de junio de 2020. Si su hora de llegada es a las 6:00 a. m., no llegue antes, ya que las puertas de [place] del [address] no abren [employer] 6:00 a. m.  RECUERDE: El incumplimiento de las instrucciones puede resultar en un riesgo mdico grave, incluso la [place]; o, a discrecin de  su cirujano y Scientific laboratory technician, su ciruga podra tener que reprogramarse.  No consuma alimentos despus de la medianoche anterior a la ciruga.  No mastique chicle ni caramelos duros.  Sin embargo, puede beber lquidos claros hasta 2 horas antes de su hora de llegada programada para la [place]. No beba nada dentro de las 2 horas previas a su hora de llegada programada.  Los lquidos claros incluyen: - Agua - Jugo de manzana sin pulpa - Gatorade (sin colorantes ROJOS) - Caf o t negro (NO agregue leche ni cremas al caf o t). NO beba nada que no est en esta lista.  Adems, su mdico le ha indicado que beba la bebida de carbohidratos preoperatoria Ensure Clear Carbohidratos. Beber esta bebida de carbohidratos ONEOK horas antes de la [place] a reducir la resistencia a la insulina y a [employer] del [place]. Por favor, termine de beberla 2 horas antes de la hora de llegada programada.  Una semana antes de la ciruga: Suspenda AHORA (08-16-23). Suspenda los antiinflamatorios (AINE) como Advil,  Aleve, ibuprofeno, Motrin, naproxeno, Naproxeno y productos a base de aspirina como Excedrin, Goody's Powder y BC Powder. Suspenda cualquier suplemento de venta libre hasta despus de la ciruga (calcio, vitamina D, magnesio).  Sin embargo, puede continuar tomando Tylenol  si lo necesita para Marketing executive de la [place].  La ltima dosis de clopidogrel  (PLAVIX ) fue el martes 08/14/23.  Contine tomando todos sus dems medicamentos recetados hasta el da de la ciruga.  EL DA DE LA CIRUGA, TOME ESTOS MEDICAMENTOS SOLO CON BOSOS DE AGUA: - Amiodarona (PACERONE ) - Carvedilol  (COREG ) - Levotiroxina (SYNTHROID )  No consuma alcohol durante las 24 horas previas ni posteriores a la [place].  No fume, incluidos los cigarrillos electrnicos, durante las 24 horas previas a la [place].  No consuma tabaco masticable durante al menos 6 horas previas a la [place].  No use parches  de nicotina el da de la [place].  No consuma drogas recreativas durante al menos una semana (preferiblemente dos) antes de la ciruga.  Tenga en cuenta que la combinacin de cocana y anestesia puede tener consecuencias negativas, incluso la [place]. Si da positivo en la prueba de cocana, se cancelar la ciruga.  La maana de la ciruga, cepllese los dientes con pasta dental y agua; puede enjuagarse la boca con enjuague bucal si lo desea. No ingiera pasta dental ni enjuague bucal.  Use jabn CHG segn las instrucciones.  No use joyas, maquillaje, horquillas, broches ni esmalte de uas.  Para joyas soldadas (permanentes): pulseras, tobilleras, fajas, etc., quteselas antes de la ciruga. Si no se las [place], es posible que el personal del hospital tenga que cortarlas el da de la [place].  No use lociones, polvos ni perfumes.  No se afeite el vello corporal del cuello para abajo 48 horas antes de la [place].  No se pueden usar lentes de contacto, audfonos ni dentaduras postizas durante la [place].  No traiga objetos de valor al hospital. [facility] no se hace responsable de la prdida de pertenencias o objetos de valor.  Informe a su mdico si observa algn cambio en su estado de salud (resfriado, fiebre, infeccin).  Lleve ropa cmoda (especfica para el tipo de [place]) al hospital.  Despus de la ciruga, puede ayudar a prevenir complicaciones pulmonares realizando ejercicios de respiracin.  Respire profundamente y tosa cada 1 o 2 horas. Su mdico podra recetarle un dispositivo llamado espirmetro incentivador para ayudarle a respirar profundamente. Al toser o Engineering geologist, sostenga firmemente una almohada contra la incisin con ambas manos. Esto se llama "entablillar". Esto ayuda a Engineer, drilling incisin y tambin disminuye las molestias abdominales.  Si va a pasar la noche en el hospital, deje su maleta en el auto. Despus de la [place], es posible que se la lleven a su  habitacin.  En caso de un aumento en el nmero de [place], podra ser necesario que usted, el [place], contine su atencin posoperatoria en el departamento de Ciruga Ambulatoria.  Si recibe el alta el da de la ciruga, no se le permitir regresar a casa en coche. Necesitar una persona responsable que lo lleve a casa y lo acompae durante 24 horas despus de la [place].  Si utiliza transporte pblico, deber estar acompaado por una persona responsable.  Si tiene [employer], llame al [employer] de Preadmisin al [phone].  Poltica de Visitas a Ciruga:  Los [employer] se sometan a [place] o un procedimiento pueden [employer] visitantes.  Los [employer] de 16 aos deben estar  acompaados por un adulto que no sea el paciente.  Visitas a Pacientes Hospitalizados:  El horario de visitas es de 7:00 a. m. a 8:00 p. m. Se permiten hasta cuatro visitantes a la vez en la habitacin del [place]. Los visitantes pueden rotar con [employer].

## 2023-08-16 NOTE — Patient Instructions (Signed)
 Pre-operative 5 CHG [place] Instructions   You can play a key role in reducing the risk of infection after surgery. Your skin needs to be as free of germs as possible. You can reduce the number of germs on your skin by washing with CHG (chlorhexidine  gluconate) soap before surgery. CHG is an antiseptic soap that kills germs and continues to kill germs even after washing.   DO NOT use if you have an allergy to chlorhexidine /CHG or antibacterial soaps. If your skin becomes reddened or irritated, stop using the CHG and notify one of our RNs at [phone].   Please shower with the CHG soap starting 4 days before surgery using the following schedule:     Please keep in mind the following:  DO NOT shave, including legs and underarms, starting the day of your first shower.   You may shave your face at any point before/day of surgery.  Place clean sheets on your bed the day you start using CHG soap. Use a clean washcloth (not used since being washed) for each shower. DO NOT sleep with pets once you start using the CHG.   CHG Shower Instructions:  If you choose to wash your hair and private area, wash first with your normal shampoo/soap.  After you use shampoo/soap, rinse your hair and body thoroughly to remove shampoo/soap residue.  Turn the water OFF and apply about 3 tablespoons (45 ml) of CHG soap to a CLEAN washcloth.  Apply CHG soap ONLY FROM YOUR NECK DOWN TO YOUR TOES (washing for 3-5 minutes)  DO NOT use CHG soap on face, private areas, open wounds, or sores.  Pay special attention to the area where your surgery is being performed.  If you are having back surgery, having someone wash your back for you may be helpful. Wait 2 minutes after CHG soap is applied, then you may rinse off the CHG soap.  Pat dry with a clean towel  Put on clean clothes/pajamas   If you choose to wear lotion, please use ONLY the CHG-compatible lotions on the back of this paper.     Additional instructions for  the day of surgery: DO NOT APPLY any lotions, deodorants, cologne, or perfumes.   Put on clean/comfortable clothes.  Brush your teeth.  Ask your nurse before applying any prescription medications to the skin.      CHG Compatible Lotions   Aveeno Moisturizing lotion  Cetaphil Moisturizing Cream  Cetaphil Moisturizing Lotion  Clairol Herbal Essence Moisturizing Lotion, Dry Skin  Clairol Herbal Essence Moisturizing Lotion, Extra Dry Skin  Clairol Herbal Essence Moisturizing Lotion, Normal Skin  Curel Age Defying Therapeutic Moisturizing Lotion with Alpha Hydroxy  Curel Extreme Care Body Lotion  Curel Soothing Hands Moisturizing Hand Lotion  Curel Therapeutic Moisturizing Cream, Fragrance-Free  Curel Therapeutic Moisturizing Lotion, Fragrance-Free  Curel Therapeutic Moisturizing Lotion, Original Formula  Eucerin Daily Replenishing Lotion  Eucerin Dry Skin Therapy Plus Alpha Hydroxy Crme  Eucerin Dry Skin Therapy Plus Alpha Hydroxy Lotion  Eucerin Original Crme  Eucerin Original Lotion  Eucerin Plus Crme Eucerin Plus Lotion  Eucerin TriLipid Replenishing Lotion  Keri Anti-Bacterial Hand Lotion  Keri Deep Conditioning Original Lotion Dry Skin Formula Softly Scented  Keri Deep Conditioning Original Lotion, Fragrance Free Sensitive Skin Formula  Keri Lotion Fast Absorbing Fragrance Free Sensitive Skin Formula  Keri Lotion Fast Absorbing Softly Scented Dry Skin Formula  Keri Original Lotion  Keri Skin Renewal Lotion Keri Silky Smooth Lotion  Keri Silky Smooth Sensitive Skin Lotion  Nivea Body Creamy Conditioning Oil  Nivea Body Extra Enriched Teacher, adult education Moisturizing Lotion Nivea Crme  Nivea Skin Firming Lotion  NutraDerm 30 Skin Lotion  NutraDerm Skin Lotion  NutraDerm Therapeutic Skin Cream  NutraDerm Therapeutic Skin Lotion  ProShield Protective Hand Cream  Provon moisturizing lotion  Instrucciones preoperatorias para el bao  con CHG  Usted puede desempear un papel clave en la reduccin del riesgo de infeccin despus de la ciruga. Su piel debe estar lo ms libre de grmenes posible. Puede reducir la cantidad de grmenes en su piel lavndose con jabn CHG (gluconato de clorhexidina) antes de la ciruga. El CHG es un jabn antisptico que elimina los grmenes y contina hacindolo incluso despus del lavado.  NO lo use si tiene alergia a la clorhexidina/CHG o a los Theatre manager. Si su piel se enrojece o se irrita, deje de usar el CHG y notifique a Ebb Goldman de nuestras enfermeras al [phone].  Dchese con el jabn CHG a partir de 4 das antes de la ciruga siguiendo el siguiente horario:  Financial planner en cuenta lo siguiente:  NO se afeite, ni siquiera las piernas ni las [place], a Glass blower/designer del da de su primera ducha. Puede afeitarse la cara en cualquier momento antes o el mismo da de la [place].  Coloque sbanas limpias en su cama el da que empiece a usar el jabn CHG.  Use una toallita limpia (sin usar desde que la lav) para cada ducha.  NO duerma con mascotas una vez que empiece a usar el CHG.  Instrucciones para la ducha con CHG: 1. Si decide lavarse el cabello y las partes ntimas, lvese primero con su champ o jabn habitual. 2. Despus de usar el champ o jabn, enjuague bien el cabello y el cuerpo para eliminar los residuos. 3. Cierre el agua y aplique aproximadamente 3 cucharadas (45 ml) de jabn CHG en una toallita LIMPIA. 4. Aplique el jabn CHG SOLO DESDE EL CUELLO HASTA LOS PIES (lavando durante 3 a 5 minutos). a. NO use jabn CHG en la cara, las partes ntimas, heridas abiertas ni llagas. b. Preste especial atencin a la zona donde se realizar la [place]. c. Si se va a someter a [place] de espalda, puede ser til que alguien le lave la espalda. 5. Espere 2 minutos despus de aplicar el jabn CHG y luego puede enjuagarse. 6. Seque con una toalla limpia. 7. Pngase ropa/pijama limpia. 8.  Si decide usar locin, utilice SOLO las lociones compatibles con CHG que se indican en el reverso de este folleto.  Instrucciones adicionales para Medical laboratory scientific officer de la ciruga: 1. NO APLIQUE lociones, desodorantes, colonia ni perfumes. 2. Pngase ropa limpia y cmoda. 3. Cepllese los dientes. 4. Consulte a su enfermera antes de aplicar cualquier medicamento recetado en la piel.  Lociones compatibles con CHG   Locin hidratante Aveeno  Crema hidratante Cetaphil  Locin hidratante Cetaphil  Locin hidratante Clairol Herbal Essence para piel seca  Locin hidratante Clairol Herbal Essence para piel extraseca  Locin hidratante Clairol Herbal Essence para piel normal  Locin hidratante teraputica antiedad Curel con alfa hidroxi  Locin corporal Curel Extreme Care  Locin hidratante de manos calmante Curel  Crema hidratante teraputica Curel sin perfume  Locin hidratante teraputica Curel sin perfume  Locin hidratante teraputica Curel con frmula original  Locin reparadora diaria Eucerin  Crema Eucerin Dry Skin Therapy Plus Alpha Hydroxy  Eucerin Dry Skin Therapy Plus Alpha Hydroxy Locin  Eucerin Crema Original  Eucerin Locin Original  Eucerin Crema Plus Locin Eucerin Plus  Eucerin Locin Regeneradora TriLipid  Keri Locin Antibacteriana para [place]  Keri Locin Acondicionadora Profunda Original, Frmula para Piel Seca con Suave Aroma  Keri Locin Acondicionadora Profunda Original, [place] para Piel Sensible sin Perfume  Keri Locin de Rpida Absorcin, [place] para Indios Sensible sin Perfume  Keri Locin de Rpida Absorcin, Frmula para Piel Seca con Suave Aroma  Keri Locin Original  Keri Locin Renovadora de Piel, Keri Locin Suave y [place]  Keri Locin Suave y [place] para Piel Sensible  Nivea Aceite Acondicionador Cremoso Corporal  Nivea Locin Extra Enriquecida Corporal  Nivea Locin Original Corporal  Nivea Locin Hidratante Transpirable Corporal, Nivea Crema  Nivea Locin  Reafirmante para la Piel  NutraDerm 30 Skin Locin  Locin para la piel NutraDerm  Crema teraputica para la piel NutraDerm  Locin teraputica para la piel NutraDerm  Crema protectora de manos ProShield  Locin hidratante Provon

## 2023-08-16 NOTE — Patient Instructions (Signed)
 Medication Instructions:  Your physician recommends that you continue on your current medications as directed. Please refer to the Current Medication list given to you today.   *If you need a refill on your cardiac medications before your next appointment, please call your pharmacy*  Lab Work: No labs ordered today  If you have labs (blood work) drawn today and your tests are completely normal, you will receive your results only by: MyChart Message (if you have MyChart) OR A paper copy in the mail If you have any lab test that is abnormal or we need to change your treatment, we will call you to review the results.  Testing/Procedures: Chest Xray  Your provider has ordered a chest X-Ray for you. You can have this done at the [facility] medical mall. You do not need an appointment. Please go to the entrance of the Medical Mall and check in at the front desk.    Follow-Up: At [facility], you and your health needs are our priority.  As part of our continuing mission to provide you with exceptional heart care, our providers are all part of one team.  This team includes your primary Cardiologist (physician) and Advanced Practice Providers or APPs (Physician Assistants and Nurse Practitioners) who all work together to provide you with the care you need, when you need it.  Your next appointment:   3 month(s)  Provider:   Sammy Crisp, MD or Ronald Cockayne, NP    We recommend signing up for the patient portal called "MyChart".  Sign up information is provided on this After Visit Summary.  MyChart is used to connect with patients for Virtual Visits (Telemedicine).  Patients are able to view lab/test results, encounter notes, upcoming appointments, etc.  Non-urgent messages can be sent to your provider as well.   To learn more about what you can do with MyChart, go to [email or web address].

## 2023-08-16 NOTE — Progress Notes (Signed)
 Anesthesia interview completed with use of hospital interpreter [facility]. Pts wife and son were in the room during interview and would help answer questions as pt was very hard of hearing even with his hearing aids in

## 2023-08-16 NOTE — Progress Notes (Signed)
 Called and spoke with receptionist at [facility] to get their fax # so I could fax over all of pts surgery instructions that were given to pt today in PAT. Received fax # and have faxed over surgery instructions with fax confirmation received

## 2023-08-17 ENCOUNTER — Ambulatory Visit: Payer: Self-pay | Admitting: Cardiology

## 2023-08-17 NOTE — Progress Notes (Signed)
 Perioperative / Anesthesia Services  Pre-Admission Testing Clinical Review / Pre-Operative Anesthesia Consult  Date: 08/17/23  PATIENT DEMOGRAPHICS: Name: Gerald Hodges DOB: [birth year] MRN:   [id number]  Note: Available PAT nursing documentation and vital signs have been reviewed. Clinical nursing staff has updated patient's PMH/PSHx, current medication list, and drug allergies/intolerances to ensure complete and comprehensive history available to assist care teams in MDM as it pertains to the aforementioned surgical procedure and anticipated anesthetic course. Extensive review of available clinical information personally performed. [place] [facility] and PSHx updated with any diagnoses/procedures that  may have been inadvertently omitted during his intake with the pre-admission testing department's nursing staff.  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 male who is submitted for pre-surgical anesthesia review and clearance prior to him undergoing the above procedure.  Patient has never been a smoker. Pertinent PMH includes: CAD, NSTEMI, NICM, HFmrEF, sustained VT (s/p AICD), PSVT, palpitations (PACs), TIA, chronic cerebral microvascular disease, aortic atherosclerosis, angina, HTN, HLD, prediabetes, hypothyroidism, GERD (on daily PPI), CKD-III, latent TB (treated), OA, SNHL, BPH.    Patient is followed by cardiology (End, MD). He was last seen in the cardiology clinic on 08/16/2023; notes reviewed. At the time of his clinic visit,  patient doing well overall from a cardiovascular perspective. Patient denied any chest pain, shortness of breath, PND, orthopnea, palpitations, significant peripheral edema, weakness, fatigue, vertiginous symptoms, or presyncope/syncope. Patient with a past medical history significant for cardiovascular diagnoses. Documented physical exam was grossly benign, providing no evidence of acute exacerbation and/or decompensation of the patient's known cardiovascular conditions.  Most recent myocardial perfusion imaging study was performed on 12/12/2019 revealing a mildly reduced left ventricular systolic function with an EF of 40 to %.  There were no regional wall motion abnormalities.  No left ventricular cavity size enlargement appreciated on review of imaging. SPECT images demonstrated a moderate size fixed defect in the inferolateral wall.  Cardiology noted this was challenging to interpret due to significant GI uptake artifact.  CT attenuation correction images with very mild aortic atherosclerosis.  There were no significant notable coronary calcifications.  TID ratio = 0.98.  No concerns for ischemia.  Study determined to be low risk.  Long-term cardiac event monitor study performed on 02/02/2021 revealing a predominant underlying sinus rhythm with an average rate of 75 bpm; range 51-123 bpm.  There were rare PACs and occasional PVCs (3% burden).  A single episode of NSVT was observed lasting 7 beats at a maximum rate of 171 bpm.  There were 11 atrial runs lasting up to 18 beats with a maximum rate of 136 bpm.  There were no sustained arrhythmias or prolonged pauses.  Patient triggered events corresponded to sinus rhythm with PVCs.  Most recent TTE performed on 04/03/2021 revealed a normal left ventricular systolic function with an EF of 55-60%. There was mild LVH.  There were no regional wall motion abnormalities. Left ventricular diastolic Doppler parameters consistent with abnormal relaxation (G1DD). Right  ventricular size and function normal with a TAPSE measuring 2.1 cm  (normal range >/= 1.6 cm).  RVSP = 38.4 mmHg. left atrium was mildly dilated.  There was moderate mitral, mild to moderate tricuspid, and mild aortic valve regurgitation observed. All transvalvular gradients were noted to be normal providing no evidence suggestive of valvular stenosis. Aorta normal in size with no evidence of ectasia or aneurysmal dilatation.  Patient suffered an NSTEMI on 04/03/2021.  Upon arrival, patient found to be in sustained ventricular tachycardia.  Workup included elevated cardiac troponin levels (peaked at 4508 ng/L).  Patient was cardioverted in the ED using a single 150 J synchronized cardioversion, which restored NSR.  He subsequently underwent diagnostic LEFT heart catheterization on 04/04/2021 that revealed multivessel nonobstructive coronary artery disease; 20% proximal RCA, 20% distal RCA, and 40% mid LAD.  LVEDP was mild to moderately elevated at 22 mmHg.  Cardiac enzyme elevation felt to be demand ischemia due to sustained ventricular tachycardia requiring cardioversion.  Intervention was deferred opting for medical management of the coronary artery disease.  EP was consulted.  Patient underwent cardiac MRI on 06/01/2021 revealing a normal left ventricular size with mildly reduced function; LVEF 46%.  There was basal inferolateral akinesis and thinning, basal inferior akinesis, and basal anterolateral hypokinesis.  Right ventricular size and function was normal with an EF of 46%.  LGE pattern was suggestive of prior infarction in the LCx territory with greater than 50% wall thickness subendocardial LGE in the affected segments.  Interestingly enough, no definite lesions were noted on patient's recent cardiac catheterization.  No likely improvement with revascularization, as affected myocardial segments unlikely to be viable.  There was at least moderate mitral valve regurgitation.  Close sequences were not  done to quantify.  Regurgitation likely due to inferolateral/inferior wall motion abnormalities.  Ventricular tachycardia likely originates from area of scarring in the basal lateral/basal inferior left ventricle.  Patient ultimately underwent placement of an AICD device on 12/26/2021.  [employer] Vigilant DR [id number] (SN: [phone]) was placed.  Device is regularly interrogated by patient's primary cardiology/electrophysiology team.  Most recent interrogation was on 07/18/2023, at which time device was noted to be functioning properly.  Given past cardiovascular history, patient remains on daily antithrombotic therapy using clopidogrel .  Patient reportedly compliant with therapy with no evidence reports of GI/GU related bleeding.  NICM and resulting HFmrEF being managed with GDMT including CCB (amiodarone ), beta-blocker (carvedilol ), diuretic (furosemide ), nitrate (isosorbide  mononitrate), and ARB (losartan ) therapies.  In addition to the scheduled nitrates, patient has a supply of short acting nitrates (NTG) to use on a as needed basis for recurrent angina/anginal equivalent symptoms; denied recent use.  Patient is on rosuvastatin  3 days a week for his HLD diagnosis and further ASCVD prevention.  He has a prediabetes diagnosis that he is managing with diet and lifestyle modification.  Most recent hemoglobin A1c was 6.0% when checked on 04/02/2021. Patient does not have an OSAH diagnosis.  Functional capacity somewhat limited by patient's age and multiple medical comorbidities.  With that said, patient is able to complete all of his ADLs/IADLs without cardiovascular limitation.  Per the DASI, patient able to exceed 4 METS of physical activity without experiencing any significant angina/anginal equivalent symptoms.  No changes were made to his medication regimen.  Patient to follow-up with outpatient cardiology and in 3 months or sooner if needed.  Gerald Hodges is scheduled for an elective TOTAL  KNEE REVISION (Left: Knee) on 08/20/2023 with Dr. Venus Ginsberg, MD. Given patient's past medical history significant for cardiovascular diagnoses, presurgical cardiac clearance was sought by the PAT team. Per cardiology, "  Mr. Prosser perioperative risk of a major cardiac event is 6.6% according to the Revised Cardiac Risk Index (RCRI).  Therefore, he is at high risk for perioperative complications.   His functional capacity is fair at 4.64 METs according to the [place] Activity Status Index (DASI). According to ACC/AHA guidelines, no further cardiovascular testing needed.  The patient may proceed to surgery at ACCEPTABLE risk".  Again, this patient is on daily antithrombotic therapy. He has been instructed on recommendations for holding his clopidogrel  for 5 days prior to his procedure with plans to restart as soon as postoperative bleeding risk felt to be minimized by his primary attending surgeon. The patient has been instructed that his last dose of should be on 08/14/2023 (initially advised by surgeon's office).  Patient denies previous perioperative complications with anesthesia in the past. In review his EMR, it is noted that patient underwent a general anesthetic course here at [facility] ([facility]) in 12/2020 without documented complications.   MOST RECENT VITAL SIGNS:    08/16/2023    2:40 PM 08/16/2023   10:32 AM 07/18/2023   10:51 AM  Vitals with BMI  Height 5\' 3"  5\' 3"  5\' 6"   Weight 173 lbs 6 oz 171 lbs 6 oz 175 lbs  BMI 30.72 30.37 28.26  Systolic 145 140 469  Diastolic 86 80 70  Pulse 70 68 64   PROVIDERS/SPECIALISTS: NOTE: Primary physician provider listed below. Patient may have been seen by APP or partner within same practice.   PROVIDER ROLE / SPECIALTY LAST Florian Hurt, MD Orthopedics (Surgeon) 08/14/2023  Janella Median, NP (Inactive) Primary Care Provider ???  Belva Boyden, MD Cardiology 08/16/2023  Harvie Liner, MD  Electrophysiology 07/18/2023   ALLERGIES: Allergies  Allergen Reactions   Tyson Gals [Aspirin ] Cough   Zestril  [Lisinopril ] Itching    Throat itching    CURRENT HOME MEDICATIONS:  allopurinol  (ZYLOPRIM ) 100 MG tablet   amiodarone  (PACERONE ) 200 MG tablet   carvedilol  (COREG ) 3.125 MG tablet   furosemide  (LASIX ) 20 MG tablet   isosorbide  mononitrate (IMDUR ) 120 MG 24 hr tablet   levothyroxine  (SYNTHROID ) 150 MCG tablet   losartan  (COZAAR ) 25 MG tablet   nitroGLYCERIN  (NITROSTAT ) 0.4 MG SL tablet   pantoprazole  (PROTONIX ) 40 MG tablet   acetaminophen  (TYLENOL ) 325 MG tablet   Calcium  Carb-Cholecalciferol  600-10 MG-MCG TABS   clopidogrel  (PLAVIX ) 75 MG tablet   hydrocortisone  cream 1 %   Magnesium  Oxide 400 MG CAPS   No current facility-administered medications for this encounter.   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 b. TTE 12/17/2019: EF 55-60%; c. 03/2021 Echo: EF 55-60%, GrI DD, mod MR; d. 05/2021 cMRI: LVEF 46%, RVEF 46%, >50% thickness subendocardial LGE sugg of prior infarct in LCX distribution. At least mod MR.   Abdominal aortic atherosclerosis (HCC)    a. MPI 12/12/2019--> very mild.   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 03/2021 Echo: Mod MR; b. 05/2021 cMRI: at least mod MR.   NICM (nonischemic cardiomyopathy) (HCC)     Nonosbructive CAD (coronary artery disease)    a. 03/2021 Cath: LM nl, LAD 17m, D1 nl, LCX min irregs, RCA 20p/d, RPDA nl, RPAV min irregs, RPL1/2/3 nl.   NSTEMI (non-ST elevated myocardial infarction) (HCC) 04/02/2021   a.) troponin peaked at 4508 ng/L --> felt to be demand ischemia in setting of sustained VT --> cMRI in 05/2021 with LGE pattern suggestive of subendocardial infarct in LCx territory   Osteoarthritis of left knee    PAC (premature atrial contraction)    Pre-diabetes    Psoriasis    PSVT (paroxysmal supraventricular tachycardia) (HCC)    Recurrent umbilical hernia    a.) repaired 01/01/2020; recurred; b.) repeat repair 12/14/2020   Renal cyst    SNHL (sensory-neural hearing loss); wears hearing aids    Sustained ventricular tachycardia (HCC) 04/02/2021   a.) s/p AICD placement   TB lung, latent 2022   TIA (transient ischemic attack)    Past Surgical History:  Procedure Laterality Date   BUNIONECTOMY Bilateral    CATARACT EXTRACTION W/ INTRAOCULAR LENS  IMPLANT, BILATERAL Bilateral    ICD IMPLANT N/A 12/26/2021   Procedure: ICD IMPLANT;  Surgeon: Boyce Byes, MD;  Location: [facility] INVASIVE CV LAB;  Service: Cardiovascular;  Laterality: N/A;   LEFT HEART CATH AND CORONARY ANGIOGRAPHY N/A 04/04/2021   Procedure: LEFT HEART CATH AND CORONARY ANGIOGRAPHY;  Surgeon: Wenona Hamilton, MD;  Location: [facility] INVASIVE CV LAB;  Service: Cardiovascular;  Laterality: N/A;   TOTAL KNEE ARTHROPLASTY Left 10/25/2016   Procedure: TOTAL KNEE ARTHROPLASTY;  Surgeon: Marlynn Singer, MD;  Location: [facility] ORS;  Service: Orthopedics;  Laterality: Left;   UMBILICAL HERNIA REPAIR N/A 01/01/2020   Procedure: HERNIA REPAIR UMBILICAL ADULT;  Surgeon: Emmalene Hare, MD;  Location: [facility] ORS;  Service: General;  Laterality: N/A;   XI ROBOTIC ASSISTED INGUINAL HERNIA REPAIR WITH MESH Left 01/01/2020   Procedure: XI ROBOTIC ASSISTED INGUINAL HERNIA REPAIR WITH MESH;  Surgeon: Emmalene Hare, MD;   Location: [facility] ORS;  Service: General;  Laterality: Left;   Family History  Problem Relation Age of Onset   Cancer Mother        stomach   Heart disease Neg Hx    Social History   Tobacco Use   Smoking status: Never   Smokeless tobacco: Never  Substance Use Topics   Alcohol use: No   LABS:  Lab Results  Component Value Date   WBC 5.9 08/16/2023   HGB 13.6 08/16/2023   HCT 40.3 08/16/2023   MCV 92.0 08/16/2023   PLT 538 (H) 08/16/2023   Lab Results  Component Value Date   NA 138 07/18/2023   CL 101 07/18/2023   K 4.9 07/18/2023   CO2 22 07/18/2023   BUN 24 07/18/2023   CREATININE 1.45 (H) 07/18/2023   EGFR 47 (L) 07/18/2023   CALCIUM  9.6 07/18/2023   PHOS 3.6 04/06/2021   ALBUMIN 4.1 07/18/2023   GLUCOSE 95 07/18/2023    ECG: Date: 08/16/2023  Time ECG obtained: 1030 AM Rate: 68 bpm Rhythm: Sinus rhythm for screening block Axis (leads I and aVF): normal Intervals: PR 256 ms. QRS 76 ms. QTc 450 ms. ST segment and T wave changes: No evidence of acute T wave abnormalities or significant ST segment  elevation or depression.  Evidence of a possible, age undetermined, prior infarct:  Yes; inferior Comparison: Similar to previous tracing obtained on 07/18/2023   IMAGING / PROCEDURES: CT ABDOMEN PELVIS W CONTRAST performed on 02/14/2022 No acute abnormality in the abdomen/pelvis Colonic diverticulosis without acute inflammation. Enlarged heterogeneous prostate gland causing mild mass effect on the bladder base. Bladder wall calcification in the dome is again seen. Slight asymmetric left bladder wall thickening persists. Consider direct visualization with cystoscopy as clinically indicated. Small bilateral fat containing inguinal hernias, left greater than right. Aortic atherosclerosis  MR CARDIAC MORPHOLOGY W WO CONTRAST performed on 06/01/2021 Normal LV size with EF 46%. Basal inferolateral akinesis and thinning, basal inferior akinesis, basal anterolateral  hypokinesis  Normal RV size and systolic function, EF 46%. LGE pattern is most suggestive of prior infarction in the left circumflex territory (coronary-pattern LGE). With > 50% wall thickness subendocardial LGE in the affected segments, they are unlikely improve in function with revascularization (unlikely to be viable). Interestingly, no definite explanatory lesion was found at recent catheterization. Mildly elevated ECV percentage, this is likely nonspecific. At last moderate MR. Unfortunately, flow sequences were not done to quantify. This may be infarct-related MR due to inferolateral/inferior wall motion abnormalities. VT likely originates from area of scarring in the basal lateral, basal inferior LV.  LEFT HEART CATHETERIZATION AND CORONARY ANGIOGRAPHY performed on 04/04/2021 Superdominant right coronary artery with relatively small LAD.  The LAD has moderate mid stenosis.  Overall, no evidence of obstructive coronary artery disease. 20% proximal RCA 20% distal RCA 40% mid LAD Left ventricular angiography was not performed due to chronic kidney disease.  EF was normal by echo. Mildly to moderately elevated left ventricular end-diastolic pressure at 22 mmHg.   TRANSTHORACIC ECHOCARDIOGRAM performed on 04/03/2021 Left ventricular ejection fraction, by estimation, is 55 to 60%. The left ventricle has normal function. The left ventricle has no regional wall motion abnormalities. There is mild left ventricular hypertrophy.  Left ventricular diastolic parameters are consistent with Grade I diastolic dysfunction (impaired relaxation).  Right ventricular systolic function is normal. The right ventricular size is normal. There is mildly elevated pulmonary artery systolic pressure. The estimated right ventricular systolic pressure is 38.4 mmHg.  The left atrial size was mildly dilated.  The mitral valve is normal in structure. Moderate mitral valve regurgitation. No evidence of mitral stenosis.   Tricuspid valve regurgitation is mild to moderate.  The aortic valve was not well visualized. Aortic valve regurgitation is mild. No aortic stenosis is present.  The inferior vena cava is normal in size with greater than 50% respiratory variability, suggesting right atrial pressure of 3 mmHg.   LONG TERM CARDIAC EVENT MONITOR STUDY performed on 02/02/2021 The patient was monitored for 13 days, 12 hours. The predominant rhythm was sinus with an average rate of 75 bpm (range 51-123 bpm in sinus). There were rare PACs and occasional PVCs (3% burden). A single episode of nonsustained ventricular tachycardia was observed, lasting 7 beats with a maximum rate of 171 bpm. There were 11 atrial runs lasting up to 18 beats, with a maximum rate of 136 bpm. No sustained arrhythmia or prolonged pause was observed. Patient triggered events corresponded to sinus rhythm and PVCs.  MYOCARDIAL PERFUSION IMAGING STUDY (LEXISCAN ) performed on 12/12/2019 Pharmacological myocardial perfusion imaging study with no significant ischemia Small to moderate-sized region of predominantly fixed defect in the inferolateral wall, challenging to interpret in the setting of GI uptake artifact Normal wall motion, EF estimated at 42% (possibly depressed  secondary to artifact) No EKG changes concerning for ischemia at peak stress or in recovery. CT attenuation correction images with very mild aortic atherosclerosis, no notable coronary calcification Low risk scan  IMPRESSION AND PLAN: Gerald Hodges has been referred for pre-anesthesia review and clearance prior to him undergoing the planned anesthetic and procedural courses. Available labs, pertinent testing, and imaging results were personally reviewed by me in preparation for upcoming operative/procedural course. [facility] medical record has been updated following extensive record review and patient interview with PAT staff.   This patient has been appropriately  cleared by cardiology with an overall ACCEPTABLE risk of patient experiencing significant perioperative cardiovascular complications. Based on clinical review performed today (08/17/23), barring any significant acute changes in the patient's overall condition, it is anticipated that he will be able to proceed with the planned surgical intervention. Any acute changes in clinical condition may necessitate his procedure being postponed and/or cancelled. Patient will meet with anesthesia team (MD and/or CRNA) on the day of his procedure for preoperative evaluation/assessment. Questions regarding anesthetic course will be fielded at that time.   Pre-surgical instructions were reviewed with the patient during his PAT appointment, and questions were fielded to satisfaction by PAT clinical staff. He has been instructed on which medications that he will need to hold prior to surgery, as well as the ones that have been deemed safe/appropriate to take on the day of his procedure. As part of the general education provided by PAT, patient made aware both verbally and in writing, that he would need to abstain from the use of any illegal substances during his perioperative course. He was advised that failure to follow the provided instructions could necessitate case cancellation or result in serious perioperative complications up to and including death. Patient encouraged to contact PAT and/or his surgeon's office to discuss any questions or concerns that may arise prior to surgery; verbalized understanding.   Renate Caroline, MSN, APRN, FNP-C, CEN [facility]  Perioperative Services Nurse Practitioner Phone: [phone] Fax: [phone] 08/17/23 4:39 PM  NOTE: This note has been prepared using Dragon dictation software. Despite my best ability to proofread, there is always the potential that unintentional transcriptional errors may still occur from this process.

## 2023-08-17 NOTE — Progress Notes (Signed)
 Sending these to you. I am unsure why they are showing up for me.

## 2023-08-19 MED ORDER — DEXAMETHASONE SODIUM PHOSPHATE 10 MG/ML IJ SOLN: 8.0000 mg | Freq: Once | INTRAMUSCULAR | Status: DC

## 2023-08-19 MED ORDER — CHLORHEXIDINE GLUCONATE 0.12 % MT SOLN
15.0000 mL | Freq: Once | OROMUCOSAL | Status: AC
  Administered 2023-08-20: 15 mL via OROMUCOSAL

## 2023-08-19 MED ORDER — CEFAZOLIN SODIUM-DEXTROSE 2-4 GM/100ML-% IV SOLN
2.0000 g | INTRAVENOUS | Status: AC
  Administered 2023-08-20: 2 g via INTRAVENOUS

## 2023-08-19 MED ORDER — LACTATED RINGERS IV SOLN
INTRAVENOUS | Status: DC
Start: 2023-08-19 — End: 2023-08-20

## 2023-08-19 MED ORDER — ORAL CARE MOUTH RINSE: 15.0000 mL | Freq: Once | OROMUCOSAL | Status: AC

## 2023-08-20 ENCOUNTER — Encounter: Payer: Self-pay | Admitting: Orthopedic Surgery

## 2023-08-20 ENCOUNTER — Inpatient Hospital Stay: Payer: Medicare (Managed Care) | Admitting: Anesthesiology

## 2023-08-20 ENCOUNTER — Encounter
Admission: RE | Disposition: A | Discharge status: Home Health | Payer: Self-pay | Source: Home / Self Care | Attending: Orthopedic Surgery

## 2023-08-20 ENCOUNTER — Ambulatory Visit: Payer: Self-pay | Admitting: Cardiology

## 2023-08-20 ENCOUNTER — Inpatient Hospital Stay: Payer: Medicare (Managed Care)

## 2023-08-20 ENCOUNTER — Other Ambulatory Visit: Payer: Self-pay

## 2023-08-20 ENCOUNTER — Inpatient Hospital Stay
Admission: RE | Admit: 2023-08-20 | Discharge: 2023-08-22 | DRG: 464 | Disposition: A | Discharge status: Home Health | Payer: Medicare (Managed Care) | Attending: Orthopedic Surgery | Admitting: Orthopedic Surgery

## 2023-08-20 ENCOUNTER — Inpatient Hospital Stay: Payer: Medicare (Managed Care) | Admitting: Urgent Care

## 2023-08-20 DIAGNOSIS — I472 Ventricular tachycardia, unspecified: Secondary | ICD-10-CM

## 2023-08-20 DIAGNOSIS — Z8616 Personal history of COVID-19: Secondary | ICD-10-CM

## 2023-08-20 DIAGNOSIS — I2510 Atherosclerotic heart disease of native coronary artery without angina pectoris: Secondary | ICD-10-CM | POA: Diagnosis present

## 2023-08-20 DIAGNOSIS — Z95810 Presence of automatic (implantable) cardiac defibrillator: Secondary | ICD-10-CM

## 2023-08-20 DIAGNOSIS — I2089 Other forms of angina pectoris: Secondary | ICD-10-CM

## 2023-08-20 DIAGNOSIS — M65862 Other synovitis and tenosynovitis, left lower leg: Secondary | ICD-10-CM | POA: Diagnosis present

## 2023-08-20 DIAGNOSIS — E039 Hypothyroidism, unspecified: Secondary | ICD-10-CM | POA: Diagnosis present

## 2023-08-20 DIAGNOSIS — I252 Old myocardial infarction: Secondary | ICD-10-CM

## 2023-08-20 DIAGNOSIS — L409 Psoriasis, unspecified: Secondary | ICD-10-CM

## 2023-08-20 DIAGNOSIS — Y831 Surgical operation with implant of artificial internal device as the cause of abnormal reaction of the patient, or of later complication, without mention of misadventure at the time of the procedure: Secondary | ICD-10-CM | POA: Diagnosis present

## 2023-08-20 DIAGNOSIS — Z227 Latent tuberculosis: Secondary | ICD-10-CM

## 2023-08-20 DIAGNOSIS — I5022 Chronic systolic (congestive) heart failure: Secondary | ICD-10-CM | POA: Diagnosis present

## 2023-08-20 DIAGNOSIS — H9193 Unspecified hearing loss, bilateral: Secondary | ICD-10-CM

## 2023-08-20 DIAGNOSIS — T8454XA Infection and inflammatory reaction due to internal left knee prosthesis, initial encounter: Principal | ICD-10-CM | POA: Diagnosis present

## 2023-08-20 DIAGNOSIS — Z8719 Personal history of other diseases of the digestive system: Secondary | ICD-10-CM

## 2023-08-20 DIAGNOSIS — L03116 Cellulitis of left lower limb: Secondary | ICD-10-CM | POA: Diagnosis present

## 2023-08-20 DIAGNOSIS — L308 Other specified dermatitis: Secondary | ICD-10-CM

## 2023-08-20 DIAGNOSIS — I4729 Other ventricular tachycardia: Secondary | ICD-10-CM

## 2023-08-20 DIAGNOSIS — E785 Hyperlipidemia, unspecified: Secondary | ICD-10-CM | POA: Diagnosis present

## 2023-08-20 DIAGNOSIS — I491 Atrial premature depolarization: Secondary | ICD-10-CM

## 2023-08-20 DIAGNOSIS — I5032 Chronic diastolic (congestive) heart failure: Secondary | ICD-10-CM

## 2023-08-20 DIAGNOSIS — I340 Nonrheumatic mitral (valve) insufficiency: Secondary | ICD-10-CM | POA: Diagnosis present

## 2023-08-20 DIAGNOSIS — Z79890 Hormone replacement therapy (postmenopausal): Secondary | ICD-10-CM

## 2023-08-20 DIAGNOSIS — Z8673 Personal history of transient ischemic attack (TIA), and cerebral infarction without residual deficits: Secondary | ICD-10-CM

## 2023-08-20 DIAGNOSIS — R600 Localized edema: Secondary | ICD-10-CM

## 2023-08-20 DIAGNOSIS — Z7902 Long term (current) use of antithrombotics/antiplatelets: Secondary | ICD-10-CM

## 2023-08-20 DIAGNOSIS — Z6832 Body mass index (BMI) 32.0-32.9, adult: Secondary | ICD-10-CM

## 2023-08-20 DIAGNOSIS — I428 Other cardiomyopathies: Secondary | ICD-10-CM | POA: Diagnosis present

## 2023-08-20 DIAGNOSIS — K219 Gastro-esophageal reflux disease without esophagitis: Secondary | ICD-10-CM | POA: Diagnosis present

## 2023-08-20 DIAGNOSIS — I130 Hypertensive heart and chronic kidney disease with heart failure and stage 1 through stage 4 chronic kidney disease, or unspecified chronic kidney disease: Secondary | ICD-10-CM | POA: Diagnosis present

## 2023-08-20 DIAGNOSIS — N281 Cyst of kidney, acquired: Secondary | ICD-10-CM

## 2023-08-20 DIAGNOSIS — Z96652 Presence of left artificial knee joint: Principal | ICD-10-CM

## 2023-08-20 DIAGNOSIS — M179 Osteoarthritis of knee, unspecified: Secondary | ICD-10-CM

## 2023-08-20 DIAGNOSIS — E6609 Other obesity due to excess calories: Secondary | ICD-10-CM

## 2023-08-20 DIAGNOSIS — N183 Chronic kidney disease, stage 3 unspecified: Secondary | ICD-10-CM | POA: Diagnosis present

## 2023-08-20 DIAGNOSIS — N400 Enlarged prostate without lower urinary tract symptoms: Secondary | ICD-10-CM | POA: Diagnosis present

## 2023-08-20 DIAGNOSIS — Z79899 Other long term (current) drug therapy: Secondary | ICD-10-CM

## 2023-08-20 DIAGNOSIS — I10 Essential (primary) hypertension: Secondary | ICD-10-CM

## 2023-08-20 DIAGNOSIS — K4090 Unilateral inguinal hernia, without obstruction or gangrene, not specified as recurrent: Secondary | ICD-10-CM

## 2023-08-20 DIAGNOSIS — I2489 Other forms of acute ischemic heart disease: Secondary | ICD-10-CM

## 2023-08-20 HISTORY — PX: TOTAL KNEE REVISION: SHX996

## 2023-08-20 SURGERY — TOTAL KNEE REVISION
Anesthesia: General | Site: Knee | Laterality: Left

## 2023-08-20 MED ORDER — ONDANSETRON HCL 4 MG/2ML IJ SOLN
INTRAMUSCULAR | Status: DC | PRN
  Administered 2023-08-20: 4 mg via INTRAVENOUS

## 2023-08-20 MED ORDER — CEFAZOLIN SODIUM-DEXTROSE 2-4 GM/100ML-% IV SOLN
INTRAVENOUS | Status: AC
  Filled 2023-08-20: qty 100

## 2023-08-20 MED ORDER — SURGIPHOR WOUND IRRIGATION SYSTEM - OPTIME
TOPICAL | Status: DC | PRN
  Administered 2023-08-20: 450 mL

## 2023-08-20 MED ORDER — AMIODARONE HCL 200 MG PO TABS
200.0000 mg | ORAL_TABLET | Freq: Every day | ORAL | Status: DC
  Administered 2023-08-21 – 2023-08-22 (×2): 200 mg via ORAL
  Filled 2023-08-20 (×2): qty 1

## 2023-08-20 MED ORDER — FENTANYL CITRATE (PF) 100 MCG/2ML IJ SOLN
INTRAMUSCULAR | Status: DC | PRN
  Administered 2023-08-20: 50 ug via INTRAVENOUS
  Administered 2023-08-20 (×2): 25 ug via INTRAVENOUS

## 2023-08-20 MED ORDER — ISOSORBIDE MONONITRATE ER 30 MG PO TB24
120.0000 mg | ORAL_TABLET | Freq: Every day | ORAL | Status: DC
  Administered 2023-08-20 – 2023-08-21 (×2): 120 mg via ORAL
  Filled 2023-08-20 (×3): qty 4

## 2023-08-20 MED ORDER — TRANEXAMIC ACID-NACL 1000-0.7 MG/100ML-% IV SOLN
INTRAVENOUS | Status: AC
  Filled 2023-08-20: qty 100

## 2023-08-20 MED ORDER — MORPHINE SULFATE (PF) 2 MG/ML IV SOLN: 0.5000 mg | INTRAVENOUS | Status: DC | PRN

## 2023-08-20 MED ORDER — ACETAMINOPHEN 10 MG/ML IV SOLN
INTRAVENOUS | Status: AC
  Filled 2023-08-20: qty 100

## 2023-08-20 MED ORDER — SODIUM CHLORIDE 0.9 % IV SOLN
2.0000 g | Freq: Every day | INTRAVENOUS | Status: DC
  Administered 2023-08-20 – 2023-08-22 (×3): 2 g via INTRAVENOUS
  Filled 2023-08-20 (×4): qty 20

## 2023-08-20 MED ORDER — BUPIVACAINE-EPINEPHRINE (PF) 0.25% -1:200000 IJ SOLN
INTRAMUSCULAR | Status: AC
  Filled 2023-08-20: qty 30

## 2023-08-20 MED ORDER — PROPOFOL 1000 MG/100ML IV EMUL
INTRAVENOUS | Status: AC
Start: 2023-08-20 — End: ?
  Filled 2023-08-20: qty 100

## 2023-08-20 MED ORDER — PROPOFOL 500 MG/50ML IV EMUL
INTRAVENOUS | Status: DC | PRN
  Administered 2023-08-20: 80 mg via INTRAVENOUS
  Administered 2023-08-20: 100 ug/kg/min via INTRAVENOUS

## 2023-08-20 MED ORDER — DEXAMETHASONE SODIUM PHOSPHATE 10 MG/ML IJ SOLN
INTRAMUSCULAR | Status: AC
  Filled 2023-08-20: qty 1

## 2023-08-20 MED ORDER — SUGAMMADEX SODIUM 200 MG/2ML IV SOLN
INTRAVENOUS | Status: DC | PRN
  Administered 2023-08-20: 200 mg via INTRAVENOUS

## 2023-08-20 MED ORDER — SODIUM CHLORIDE 0.9 % IV SOLN
8.0000 mg/kg | INTRAVENOUS | Status: DC
  Filled 2023-08-20: qty 12

## 2023-08-20 MED ORDER — PROPOFOL 10 MG/ML IV BOLUS
INTRAVENOUS | Status: AC
  Filled 2023-08-20: qty 20

## 2023-08-20 MED ORDER — PHENOL 1.4 % MT LIQD
1.0000 | OROMUCOSAL | Status: DC | PRN
Start: 2023-08-20 — End: 2023-08-22

## 2023-08-20 MED ORDER — METHYLENE BLUE (ANTIDOTE) 1 % IV SOLN
INTRAVENOUS | Status: AC
  Filled 2023-08-20: qty 10

## 2023-08-20 MED ORDER — FENTANYL CITRATE (PF) 100 MCG/2ML IJ SOLN
INTRAMUSCULAR | Status: AC
  Filled 2023-08-20: qty 2

## 2023-08-20 MED ORDER — LIDOCAINE HCL (CARDIAC) PF 100 MG/5ML IV SOSY
PREFILLED_SYRINGE | INTRAVENOUS | Status: DC | PRN
  Administered 2023-08-20: 100 mg via INTRAVENOUS

## 2023-08-20 MED ORDER — TRAMADOL HCL 50 MG PO TABS
50.0000 mg | ORAL_TABLET | Freq: Four times a day (QID) | ORAL | Status: DC | PRN
  Administered 2023-08-20 – 2023-08-22 (×4): 50 mg via ORAL
  Filled 2023-08-20 (×4): qty 1

## 2023-08-20 MED ORDER — TOBRAMYCIN SULFATE 80 MG/2ML IJ SOLN
INTRAMUSCULAR | Status: DC | PRN
  Administered 2023-08-20: 240 mg via INTRAMUSCULAR

## 2023-08-20 MED ORDER — ONDANSETRON HCL 4 MG/2ML IJ SOLN
INTRAMUSCULAR | Status: AC
  Filled 2023-08-20: qty 2

## 2023-08-20 MED ORDER — SODIUM CHLORIDE (PF) 0.9 % IJ SOLN
INTRAMUSCULAR | Status: AC
Start: 2023-08-20 — End: ?
  Filled 2023-08-20: qty 20

## 2023-08-20 MED ORDER — ONDANSETRON HCL 4 MG/2ML IJ SOLN: 4.0000 mg | Freq: Four times a day (QID) | INTRAMUSCULAR | Status: DC | PRN

## 2023-08-20 MED ORDER — OXYCODONE HCL 5 MG/5ML PO SOLN: 5.0000 mg | Freq: Once | ORAL | Status: DC | PRN

## 2023-08-20 MED ORDER — ONDANSETRON HCL 4 MG PO TABS
4.0000 mg | ORAL_TABLET | Freq: Four times a day (QID) | ORAL | Status: DC | PRN
  Administered 2023-08-20: 4 mg via ORAL
  Filled 2023-08-20: qty 1

## 2023-08-20 MED ORDER — BUPIVACAINE HCL (PF) 0.5 % IJ SOLN
INTRAMUSCULAR | Status: AC
  Filled 2023-08-20: qty 10

## 2023-08-20 MED ORDER — 0.9 % SODIUM CHLORIDE (POUR BTL) OPTIME
TOPICAL | Status: DC | PRN
  Administered 2023-08-20: 200 mL

## 2023-08-20 MED ORDER — SODIUM CHLORIDE 0.9 % IV SOLN: INTRAVENOUS | Status: DC

## 2023-08-20 MED ORDER — DEXAMETHASONE SODIUM PHOSPHATE 10 MG/ML IJ SOLN
INTRAMUSCULAR | Status: DC | PRN
  Administered 2023-08-20: 8 mg via INTRAVENOUS

## 2023-08-20 MED ORDER — PANTOPRAZOLE SODIUM 40 MG PO TBEC
40.0000 mg | DELAYED_RELEASE_TABLET | Freq: Every day | ORAL | Status: DC
  Administered 2023-08-20 – 2023-08-22 (×3): 40 mg via ORAL
  Filled 2023-08-20 (×3): qty 1

## 2023-08-20 MED ORDER — ACETAMINOPHEN 325 MG PO TABS: 325.0000 mg | ORAL_TABLET | Freq: Four times a day (QID) | ORAL | Status: DC | PRN

## 2023-08-20 MED ORDER — SODIUM CHLORIDE 0.9 % IV SOLN
8.0000 mg/kg | Freq: Once | INTRAVENOUS | Status: AC
  Administered 2023-08-20: 600 mg via INTRAVENOUS
  Filled 2023-08-20: qty 12

## 2023-08-20 MED ORDER — BUPIVACAINE LIPOSOME 1.3 % IJ SUSP
INTRAMUSCULAR | Status: AC
  Filled 2023-08-20: qty 20

## 2023-08-20 MED ORDER — MENTHOL 3 MG MT LOZG: 1.0000 | LOZENGE | OROMUCOSAL | Status: DC | PRN

## 2023-08-20 MED ORDER — METOCLOPRAMIDE HCL 10 MG PO TABS: 5.0000 mg | ORAL_TABLET | Freq: Three times a day (TID) | ORAL | Status: DC | PRN

## 2023-08-20 MED ORDER — ROCURONIUM BROMIDE 10 MG/ML (PF) SYRINGE
PREFILLED_SYRINGE | INTRAVENOUS | Status: AC
  Filled 2023-08-20: qty 10

## 2023-08-20 MED ORDER — ACETAMINOPHEN 10 MG/ML IV SOLN
INTRAVENOUS | Status: DC | PRN
  Administered 2023-08-20: 1000 mg via INTRAVENOUS

## 2023-08-20 MED ORDER — LOSARTAN POTASSIUM 50 MG PO TABS
25.0000 mg | ORAL_TABLET | Freq: Every day | ORAL | Status: DC
  Administered 2023-08-21 – 2023-08-22 (×2): 25 mg via ORAL
  Filled 2023-08-20 (×2): qty 1

## 2023-08-20 MED ORDER — VANCOMYCIN HCL 1000 MG IV SOLR
INTRAVENOUS | Status: AC
Start: 2023-08-20 — End: ?
  Filled 2023-08-20: qty 20

## 2023-08-20 MED ORDER — CLOPIDOGREL BISULFATE 75 MG PO TABS
75.0000 mg | ORAL_TABLET | Freq: Every day | ORAL | Status: DC
  Administered 2023-08-21 – 2023-08-22 (×2): 75 mg via ORAL
  Filled 2023-08-20 (×2): qty 1

## 2023-08-20 MED ORDER — CARVEDILOL 3.125 MG PO TABS
3.1250 mg | ORAL_TABLET | Freq: Two times a day (BID) | ORAL | Status: DC
  Administered 2023-08-20 – 2023-08-22 (×4): 3.125 mg via ORAL
  Filled 2023-08-20 (×4): qty 1

## 2023-08-20 MED ORDER — ROCURONIUM BROMIDE 100 MG/10ML IV SOLN
INTRAVENOUS | Status: DC | PRN
  Administered 2023-08-20: 50 mg via INTRAVENOUS
  Administered 2023-08-20: 20 mg via INTRAVENOUS

## 2023-08-20 MED ORDER — LEVOTHYROXINE SODIUM 25 MCG PO TABS
150.0000 ug | ORAL_TABLET | Freq: Every day | ORAL | Status: DC
  Administered 2023-08-21 – 2023-08-22 (×2): 150 ug via ORAL
  Filled 2023-08-20 (×2): qty 6

## 2023-08-20 MED ORDER — TOBRAMYCIN SULFATE 80 MG/2ML IJ SOLN
INTRAMUSCULAR | Status: AC
Start: 2023-08-20 — End: ?
  Filled 2023-08-20: qty 6

## 2023-08-20 MED ORDER — METOCLOPRAMIDE HCL 5 MG/ML IJ SOLN: 5.0000 mg | Freq: Three times a day (TID) | INTRAMUSCULAR | Status: DC | PRN

## 2023-08-20 MED ORDER — TRANEXAMIC ACID-NACL 1000-0.7 MG/100ML-% IV SOLN
1000.0000 mg | INTRAVENOUS | Status: AC
  Administered 2023-08-20 (×2): 1000 mg via INTRAVENOUS

## 2023-08-20 MED ORDER — KETOROLAC TROMETHAMINE 15 MG/ML IJ SOLN
7.5000 mg | Freq: Four times a day (QID) | INTRAMUSCULAR | Status: AC
  Administered 2023-08-20 – 2023-08-21 (×4): 7.5 mg via INTRAVENOUS
  Filled 2023-08-20 (×4): qty 1

## 2023-08-20 MED ORDER — OXYCODONE HCL 5 MG PO TABS: 5.0000 mg | ORAL_TABLET | Freq: Once | ORAL | Status: DC | PRN

## 2023-08-20 MED ORDER — ALLOPURINOL 100 MG PO TABS
100.0000 mg | ORAL_TABLET | Freq: Every day | ORAL | Status: DC
  Administered 2023-08-21 – 2023-08-22 (×2): 100 mg via ORAL
  Filled 2023-08-20 (×3): qty 1

## 2023-08-20 MED ORDER — LACTATED RINGERS IR SOLN
Status: DC | PRN
  Administered 2023-08-20: 3000 mL

## 2023-08-20 MED ORDER — VANCOMYCIN HCL 1000 MG IV SOLR
INTRAVENOUS | Status: DC | PRN
  Administered 2023-08-20: 1000 mg via TOPICAL

## 2023-08-20 MED ORDER — FENTANYL CITRATE (PF) 100 MCG/2ML IJ SOLN
25.0000 ug | INTRAMUSCULAR | Status: DC | PRN
  Administered 2023-08-20 (×2): 50 ug via INTRAVENOUS

## 2023-08-20 MED ORDER — SODIUM CHLORIDE 0.9 % IR SOLN
Status: DC | PRN
  Administered 2023-08-20 (×2): 3000 mL

## 2023-08-20 MED ORDER — ACETAMINOPHEN 500 MG PO TABS
1000.0000 mg | ORAL_TABLET | Freq: Three times a day (TID) | ORAL | Status: AC
  Administered 2023-08-20 – 2023-08-21 (×3): 1000 mg via ORAL
  Filled 2023-08-20 (×4): qty 2

## 2023-08-20 MED ORDER — DOCUSATE SODIUM 100 MG PO CAPS
100.0000 mg | ORAL_CAPSULE | Freq: Two times a day (BID) | ORAL | Status: DC
  Administered 2023-08-20 – 2023-08-22 (×4): 100 mg via ORAL
  Filled 2023-08-20 (×4): qty 1

## 2023-08-20 MED ORDER — FUROSEMIDE 20 MG PO TABS
20.0000 mg | ORAL_TABLET | Freq: Every day | ORAL | Status: DC
  Administered 2023-08-21 – 2023-08-22 (×2): 20 mg via ORAL
  Filled 2023-08-20 (×2): qty 1

## 2023-08-20 MED ORDER — PHENYLEPHRINE HCL (PRESSORS) 10 MG/ML IV SOLN
INTRAVENOUS | Status: AC
  Filled 2023-08-20: qty 1

## 2023-08-20 MED ORDER — ENOXAPARIN SODIUM 30 MG/0.3ML IJ SOSY
30.0000 mg | PREFILLED_SYRINGE | INTRAMUSCULAR | Status: DC
  Administered 2023-08-21 – 2023-08-22 (×2): 30 mg via SUBCUTANEOUS
  Filled 2023-08-20 (×2): qty 0.3

## 2023-08-20 MED ORDER — CHLORHEXIDINE GLUCONATE 0.12 % MT SOLN
OROMUCOSAL | Status: AC
  Filled 2023-08-20: qty 15

## 2023-08-20 MED ORDER — HYDROCODONE-ACETAMINOPHEN 5-325 MG PO TABS
1.0000 | ORAL_TABLET | ORAL | Status: DC | PRN
  Administered 2023-08-20: 2 via ORAL
  Administered 2023-08-21: 1 via ORAL
  Filled 2023-08-20: qty 2
  Filled 2023-08-20: qty 1

## 2023-08-20 MED ORDER — IRRISEPT - 450ML BOTTLE WITH 0.05% CHG IN STERILE WATER, USP 99.95% OPTIME
TOPICAL | Status: DC | PRN
  Administered 2023-08-20: 450 mL

## 2023-08-20 SURGICAL SUPPLY — 62 items
BLADE FLEX CHISEL 8 2.5 (MISCELLANEOUS) IMPLANT
BNDG ELASTIC 6INX 5YD STR LF (GAUZE/BANDAGES/DRESSINGS) ×1 IMPLANT
BRUSH SCRUB EZ PLAIN DRY (MISCELLANEOUS) ×1 IMPLANT
CHLORAPREP W/TINT 26 (MISCELLANEOUS) ×2 IMPLANT
CNTNR URN SCR LID CUP LEK RST (MISCELLANEOUS) IMPLANT
COOLER POLAR GLACIER W/PUMP (MISCELLANEOUS) ×1 IMPLANT
CUFF TRNQT CYL 24X4X16.5-23 (TOURNIQUET CUFF) IMPLANT
DERMABOND ADVANCED .7 DNX12 (GAUZE/BANDAGES/DRESSINGS) IMPLANT
DRAPE SHEET LG 3/4 BI-LAMINATE (DRAPES) ×2 IMPLANT
DRSG OPSITE POSTOP 4X10 (GAUZE/BANDAGES/DRESSINGS) IMPLANT
DRSG OPSITE POSTOP 4X8 (GAUZE/BANDAGES/DRESSINGS) IMPLANT
ELECTRODE REM PT RTRN 9FT ADLT (ELECTROSURGICAL) ×1 IMPLANT
EVACUATOR 1/8 PVC DRAIN (DRAIN) IMPLANT
GLOVE BIO SURGEON STRL SZ8 (GLOVE) ×1 IMPLANT
GLOVE BIOGEL PI IND STRL 8 (GLOVE) ×1 IMPLANT
GLOVE PI ORTHO PRO STRL 7.5 (GLOVE) ×2 IMPLANT
GLOVE PI ORTHO PRO STRL SZ8 (GLOVE) ×2 IMPLANT
GLOVE SURG SYN 7.5 E (GLOVE) ×1 IMPLANT
GLOVE SURG SYN 7.5 PF PI (GLOVE) ×1 IMPLANT
GOWN SRG XL LVL 3 NONREINFORCE (GOWNS) ×1 IMPLANT
GOWN STRL REUS W/ TWL LRG LVL3 (GOWN DISPOSABLE) ×1 IMPLANT
GOWN STRL REUS W/ TWL XL LVL3 (GOWN DISPOSABLE) ×1 IMPLANT
HOLDER FOLEY CATH W/STRAP (MISCELLANEOUS) ×1 IMPLANT
HOOD PEEL AWAY T7 (MISCELLANEOUS) ×2 IMPLANT
INSERT TIB LCS RP LRG 17.5 (Knees) IMPLANT
KIT STIMULAN RAPID CURE 5CC (Orthopedic Implant) IMPLANT
KIT TURNOVER KIT A (KITS) ×1 IMPLANT
MANIFOLD NEPTUNE II (INSTRUMENTS) ×1 IMPLANT
MARKER SKIN DUAL TIP RULER LAB (MISCELLANEOUS) ×1 IMPLANT
MAT ABSORB FLUID 56X50 GRAY (MISCELLANEOUS) ×1 IMPLANT
NDL FILTER BLUNT 18X1 1/2 (NEEDLE) IMPLANT
NDL HYPO 21X1.5 SAFETY (NEEDLE) IMPLANT
NDL SAFETY ECLIPSE 18X1.5 (NEEDLE) IMPLANT
NEEDLE FILTER BLUNT 18X1 1/2 (NEEDLE) IMPLANT
NEEDLE HYPO 21X1.5 SAFETY (NEEDLE) IMPLANT
NS IRRIG 500ML POUR BTL (IV SOLUTION) ×1 IMPLANT
OSTEOTOME THIN 10 5 (MISCELLANEOUS) IMPLANT
PACK TOTAL KNEE (MISCELLANEOUS) ×1 IMPLANT
PAD ARMBOARD POSITIONER FOAM (MISCELLANEOUS) ×3 IMPLANT
PAD WRAPON POLAR KNEE (MISCELLANEOUS) ×1 IMPLANT
PATELLA LRG REPLACEMENT (Knees) IMPLANT
PENCIL SMOKE EVACUATOR (MISCELLANEOUS) ×1 IMPLANT
SOL .9 NS 3000ML IRR UROMATIC (IV SOLUTION) ×1 IMPLANT
SOLUTION IRRIG SURGIPHOR (IV SOLUTION) ×1 IMPLANT
STOCKINETTE IMPERV 14X48 (MISCELLANEOUS) ×1 IMPLANT
SUT MON AB 2-0 CT1 36 (SUTURE) IMPLANT
SUT PDS AB 0 CT1 27 (SUTURE) IMPLANT
SUT PDS AB 1 CT1 36 (SUTURE) IMPLANT
SUT STRATAFIX 14 PDO 36 VLT (SUTURE) IMPLANT
SUT VIC AB 0 CT1 36 (SUTURE) IMPLANT
SUT VIC AB 1 CT1 36 (SUTURE) IMPLANT
SUT VIC AB 2-0 CT2 27 (SUTURE) IMPLANT
SUTURE STRATA SPIR 4-0 18 (SUTURE) IMPLANT
SWAB CULTURE AMIES ANAERIB BLU (MISCELLANEOUS) IMPLANT
SYR 20ML LL LF (SYRINGE) IMPLANT
SYR 3ML LL SCALE MARK (SYRINGE) IMPLANT
TAPE CLOTH 3X10 WHT NS LF (GAUZE/BANDAGES/DRESSINGS) ×1 IMPLANT
TIP FAN IRRIG PULSAVAC PLUS (DISPOSABLE) ×1 IMPLANT
TOWEL OR 17X26 4PK STRL BLUE (TOWEL DISPOSABLE) IMPLANT
TRAP FLUID SMOKE EVACUATOR (MISCELLANEOUS) ×1 IMPLANT
TRAY FOLEY SLVR 16FR LF STAT (SET/KITS/TRAYS/PACK) ×1 IMPLANT
WATER STERILE IRR 1000ML POUR (IV SOLUTION) ×1 IMPLANT

## 2023-08-20 NOTE — Progress Notes (Signed)
 No concerning findings in the chest for pneumonia, chronic changes of the left hemidiaphragm. No acute findings to suggest back pain.

## 2023-08-20 NOTE — Anesthesia Procedure Notes (Addendum)
 Procedure Name: Intubation Date/Time: 08/20/2023 1:22 PM  Performed by: Ian Maine, RNPre-anesthesia Checklist: Patient identified, Patient being monitored, Timeout performed, Emergency Drugs available and Suction available Patient Re-evaluated:Patient Re-evaluated prior to induction Oxygen Delivery Method: Circle system utilized Preoxygenation: Pre-oxygenation with 100% oxygen Induction Type: IV induction Ventilation: Mask ventilation without difficulty Laryngoscope Size: McGrath and 4 Grade View: Grade I Tube type: Oral Tube size: 7.5 mm Number of attempts: 1 Airway Equipment and Method: Stylet Placement Confirmation: ETT inserted through vocal cords under direct vision, positive ETCO2 and breath sounds checked- equal and bilateral Secured at: 21 cm Tube secured with: Tape Dental Injury: Teeth and Oropharynx as per pre-operative assessment  Comments: DL x1 with McGrath MAC blade 4, grade 1 view. Atraumatic intubation. Dentition unchanged from preop baseline. Anesthesiologist and CRNA present and readily available to assist SRNA.

## 2023-08-20 NOTE — Consult Note (Signed)
 Infectious Disease     Reason for Consult:Prosthetic joint infection    Referring Physician: Dr Clyda Dark Date of Admission:  08/20/2023   Principal Problem:   S/P revision of total knee, left   HPI: Patient seen with his wife and spanish intepreter.   Gerald Hodges is a 88 y.o. male with MMP including CHF, defibrillator, CAD, OA admitted for surgery on prosthetic knee with concern for infection. Underwent knee aspiration 5/27 and had crp 79, esr 31. WBC 16 K in fluid 98% PMNS. Cx negative. Initial TKR was done 2018 and no reported issues prior to this.  No recent abx given. To Or 6/9  for poly exchange. Findings of large amt of cloury purulent fluid and extensive synovitis. Abx beads placed as well. Gram stain with one of several showing GPC in clusters.  Cx pending.  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 b. TTE 12/17/2019: EF 55-60%; c. 03/2021 Echo: EF 55-60%, GrI DD, mod MR; d. 05/2021 cMRI: LVEF 46%, RVEF 46%, >50% thickness subendocardial LGE sugg of prior infarct in LCX distribution. At least mod MR.   Abdominal aortic atherosclerosis (HCC)    a. MPI 12/12/2019--> very mild.   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 03/2021 Echo: Mod MR; b. 05/2021 cMRI: at least mod MR.    NICM (nonischemic cardiomyopathy) (HCC)    Nonosbructive CAD (coronary artery disease)    a. 03/2021 Cath: LM nl, LAD 18m, D1 nl, LCX min irregs, RCA 20p/d, RPDA nl, RPAV min irregs, RPL1/2/3 nl.   NSTEMI (non-ST elevated myocardial infarction) (HCC) 04/02/2021   a.) troponin peaked at 4508 ng/L --> felt to be demand ischemia in setting of sustained VT --> cMRI in 05/2021 with LGE pattern suggestive of subendocardial infarct in LCx territory   Osteoarthritis of left knee    PAC (premature atrial contraction)    Pre-diabetes    Psoriasis    PSVT (paroxysmal supraventricular tachycardia) (HCC)    Recurrent umbilical hernia    a.) repaired 01/01/2020; recurred; b.) repeat repair 12/14/2020   Renal cyst    SNHL (sensory-neural hearing loss); wears hearing aids    Sustained ventricular tachycardia (HCC) 04/02/2021   a.) s/p AICD placement   TB lung, latent 2022   TIA (transient ischemic attack)    Past Surgical History:  Procedure Laterality Date   BUNIONECTOMY Bilateral    CATARACT EXTRACTION W/ INTRAOCULAR LENS  IMPLANT, BILATERAL Bilateral    ICD IMPLANT N/A 12/26/2021   Procedure: ICD IMPLANT;  Surgeon: Boyce Byes, MD;  Location: [facility];  Service: Cardiovascular;  Laterality: N/A;   LEFT HEART CATH AND CORONARY ANGIOGRAPHY N/A 04/04/2021   Procedure: LEFT HEART CATH AND CORONARY ANGIOGRAPHY;  Surgeon: Alvenia Aus,  Tia Flowers, MD;  Location: [facility];  Service: Cardiovascular;  Laterality: N/A;   TOTAL KNEE ARTHROPLASTY Left 10/25/2016   Procedure: TOTAL KNEE ARTHROPLASTY;  Surgeon: Marlynn Singer, MD;  Location: [facility] OR[facility];  Service: Orthopedics;  Laterality: Left;   UMBILICAL HERNIA REPAIR N/A 01/01/2020   Procedure: HERNIA REPAIR UMBILICAL ADULT;  Surgeon: Emmalene Hare, MD;  Location: [facility] OR[facility];  Service: General;  Laterality: N/A;   XI ROBOTIC ASSISTED INGUINAL HERNIA REPAIR WITH MESH Left 01/01/2020   Procedure: XI ROBOTIC ASSISTED INGUINAL HERNIA REPAIR  WITH MESH;  Surgeon: Emmalene Hare, MD;  Location: [facility] OR[facility];  Service: General;  Laterality: Left;   Social History   Tobacco Use   Smoking status: Never   Smokeless tobacco: Never  Vaping Use   Vaping status: Never Used  Substance Use Topics   Alcohol use: No   Drug use: No   Family History  Problem Relation Age of Onset   Cancer Mother        stomach   Heart disease Neg Hx     Allergies:  Allergies  Allergen Reactions   Asa [Aspirin ] Cough   Zestril  [Lisinopril ] Itching    Throat itching     Current antibiotics: Antibiotics Given (last 72 hours)     Date/Time Action Medication Dose Rate   08/20/23 1329 Given   ceFAZolin  (ANCEF ) IVPB 2g/100 mL premix 2 g    08/20/23 1428 Given  [used in stimulant beads]   vancomycin  (VANCOCIN ) powder 1,000 mg    08/20/23 1429 Given  [240 mg (6 mL) mixed with vancomycin  and used in stimulant beads]   tobramycin (NEBCIN) injection 240 mg    08/20/23 2006 New Bag/Given   DAPTOmycin (CUBICIN) 600 mg in sodium chloride  0.9 % IVPB 600 mg 124 mL/hr   08/20/23 2046 New Bag/Given   cefTRIAXone (ROCEPHIN) 2 g in sodium chloride  0.9 % 100 mL IVPB 2 g 200 mL/hr       MEDICATIONS:  acetaminophen   1,000 mg Oral Q8H   allopurinol   100 mg Oral Daily   amiodarone   200 mg Oral Daily   carvedilol   3.125 mg Oral BID WC   clopidogrel   75 mg Oral Daily   docusate sodium   100 mg Oral BID   enoxaparin  (LOVENOX ) injection  30 mg Subcutaneous Q24H   furosemide   20 mg Oral Daily   isosorbide  mononitrate  120 mg Oral QHS   ketorolac   7.5 mg Intravenous Q6H   levothyroxine   150 mcg Oral Q0600   losartan   25 mg Oral Daily   pantoprazole   40 mg Oral Daily    Review of Systems - 11 systems reviewed and negative per HPI   OBJECTIVE: Temp:  [97 F (36.1 C)-98.7 F (37.1 C)] 97.7 F (36.5 C) (06/10 0749) Pulse Rate:  [58-69] 65 (06/10 0749) Resp:  [11-20] 15 (06/10 0749) BP: (125-177)/(72-90) 125/72 (06/10 0749) SpO2:  [96 %-100 %] 97 %  (06/10 0749) Weight:  [78.7 kg] 78.7 kg (06/09 1135) Physical Exam  Constitutional: He is awake and interactive He appears well-developed and well-nourished. No distress.  HENT: anicter Mouth/Throat: Oropharynx is clear and moist. No oropharyngeal exudate.  Cardiovascular: Normal rate, regular rhythm and normal heart sounds.1/6 sm Pulmonary/Chest: Effort normal and breath sounds normal. No respiratory distress. He has no wheezes.  Abdominal: Soft. Bowel sounds are normal. He exhibits no distension. There is no tenderness.  Lymphadenopathy: He has no cervical adenopathy.  Neurological: He is alert and interactive  Skin: Skin is  warm and dry. No rash noted. No erythema.  L knee wrapped post op- post op drain in place Psychiatric: He has a normal mood and affect. His behavior is normal.   LABS: Results for orders placed or performed during the hospital encounter of 08/20/23 (from the past 48 hours)  Aerobic/Anaerobic Culture w Gram Stain (surgical/deep wound)     Status: None (Preliminary result)   Collection Time: 08/20/23  1:59 PM   Specimen: Path fluid; Body Fluid  Result Value Ref Range   Specimen Description      FLUID Performed at [facility], [address] [place], [place] [address]    Special Requests      LEFT KNEE [facility] CULTURE Performed at [facility], [address] [place], [place] [address]    Gram Stain      MODERATE WBC PRESENT,BOTH PMN AND MONONUCLEAR NO ORGANISMS SEEN Performed at [facility], [address] [place], [place] [address]    Culture PENDING    Report Status PENDING   Aerobic/Anaerobic Culture w Gram Stain (surgical/deep wound)     Status: None (Preliminary result)   Collection Time: 08/20/23  2:01 PM   Specimen: Path fluid; Body Fluid  Result Value Ref Range   Specimen Description      FLUID Performed at [facility], [address] [place], [place] [address]    Special Requests      LEFT KNEE  SYNOVIUM CULTURE 1 Performed at [facility], [address] [place], [place] [address]    Gram Stain      RARE WBC PRESENT, PREDOMINANTLY MONONUCLEAR RARE GRAM POSITIVE COCCI IN CLUSTERS Performed at [facility], [address] [place], [place] [address]    Culture PENDING    Report Status PENDING   Aerobic/Anaerobic Culture w Gram Stain (surgical/deep wound)     Status: None (Preliminary result)   Collection Time: 08/20/23  2:06 PM   Specimen: Path fluid; Body Fluid  Result Value Ref Range   Specimen Description      FLUID Performed at [facility], [address], [place], [place] [address]    Special Requests      LEFT KNEE SYNOVIAL CULTURE 2 Performed at [facility], [address] [place], [place] [address]    Gram Stain      NO WBC SEEN NO ORGANISMS SEEN Performed at [facility], [address] [place], [place] [address]    Culture PENDING    Report Status PENDING   Aerobic/Anaerobic Culture w Gram Stain (surgical/deep wound)     Status: None (Preliminary result)   Collection Time: 08/20/23  2:06 PM   Specimen: Path fluid; Body Fluid  Result Value Ref Range   Specimen Description      FLUID Performed at [facility], [address], [place], [place] [address]    Special Requests      LEFT KNEE SYNOVIUM CULTURE 3 Performed at [facility], [address] [place], [place] [address]    Gram Stain      RARE WBC PRESENT, PREDOMINANTLY MONONUCLEAR NO ORGANISMS SEEN Performed at [facility], [address], [place], [place] [address]    Culture PENDING    Report Status PENDING   Aerobic/Anaerobic Culture w Gram Stain (surgical/deep wound)     Status: None (Preliminary result)   Collection Time: 08/20/23  2:15 PM   Specimen: Path fluid; Body Fluid  Result Value Ref  Range   Specimen Description      FLUID Performed at [facility], [address] [place], [place] [address]     Special Requests      LEFT POSTERIOR KNEE CULTURE Performed at [facility], [address] [place], [place] [address]    Gram Stain      NO WBC SEEN NO ORGANISMS SEEN Performed at [facility], [address], [place], [place] [address]    Culture PENDING    Report Status PENDING   Aerobic/Anaerobic Culture w Gram Stain (surgical/deep wound)     Status: None (Preliminary result)   Collection Time: 08/20/23  2:16 PM   Specimen: Path fluid; Body Fluid  Result Value Ref Range   Specimen Description      FLUID Performed at [facility], [address] [place], [place] [address]    Special Requests      LEFT KNEE TIBIAL COMONENET CULTURE Performed at [facility], [address] [place], [place] [address]    Gram Stain      NO WBC SEEN NO ORGANISMS SEEN Performed at [facility], [address], [place], [place] [address]    Culture PENDING    Report Status PENDING   CK     Status: Abnormal   Collection Time: 08/21/23  6:50 AM  Result Value Ref Range   Total CK 23 (L) 49 - 397 U/L    Comment: Performed at [facility], [address] [place], [place] [address]  CBC     Status: Abnormal   Collection Time: 08/21/23  6:50 AM  Result Value Ref Range   WBC 7.4 4.0 - 10.5 K/uL   RBC 3.51 (L) 4.22 - 5.81 MIL/uL   Hemoglobin 10.9 (L) 13.0 - 17.0 g/dL   HCT 96.2 (L) 95.2 - 84.1 %   MCV 90.3 80.0 - 100.0 fL   MCH 31.1 26.0 - 34.0 pg   MCHC 34.4 30.0 - 36.0 g/dL   RDW 32.4 40.1 - 02.7 %   Platelets 387 150 - 400 K/uL   nRBC 0.0 0.0 - 0.2 %    Comment: Performed at [facility], [address] [place], [place] [address]  Basic metabolic panel     Status: Abnormal   Collection Time: 08/21/23  6:50 AM  Result Value Ref Range   Sodium 129 (L) 135 - 145 mmol/L   Potassium 4.1 3.5 - 5.1 mmol/L   Chloride 98 98 - 111 mmol/L   CO2 22 22 - 32 mmol/L   Glucose, Bld 142 (H) 70 - 99 mg/dL    Comment: Glucose reference  range applies only to samples taken after fasting for at least 8 hours.   BUN 17 8 - 23 mg/dL   Creatinine, Ser 4.40 0.61 - 1.24 mg/dL   Calcium  8.3 (L) 8.9 - 10.3 mg/dL   GFR, Estimated >34 >74 mL/min    Comment: (NOTE) Calculated using the CKD-EPI Creatinine Equation (2021)    Anion gap 9 5 - 15    Comment: Performed at [facility], [address] [place], [place] [address]   No components found for: "ESR", "C REACTIVE PROTEIN" MICRO: Recent Results (from the past 720 hours)  Surgical pcr screen     Status: None   Collection Time: 08/16/23  3:07 PM   Specimen: Nasal Mucosa; Nasal Swab  Result Value Ref Range Status   MRSA, PCR NEGATIVE NEGATIVE Final   Staphylococcus aureus  NEGATIVE NEGATIVE Final    Comment: (NOTE) The Xpert SA Assay (FDA approved for NASAL specimens in patients 31 years of age and older), is one component of a comprehensive surveillance program. It is not intended to diagnose infection nor to guide or monitor treatment. Performed at [facility], [address] [place], [place] [address]   Aerobic/Anaerobic Culture w Gram Stain (surgical/deep wound)     Status: None (Preliminary result)   Collection Time: 08/20/23  1:59 PM   Specimen: Path fluid; Body Fluid  Result Value Ref Range Status   Specimen Description   Final    FLUID Performed at [facility], [address] [place], [place] [address]    Special Requests   Final    LEFT KNEE [facility] CULTURE Performed at [facility], [address] [place], [place] [address]    Gram Stain   Final    MODERATE WBC PRESENT,BOTH PMN AND MONONUCLEAR NO ORGANISMS SEEN Performed at [facility], [address], [place], [place] [address]    Culture PENDING  Incomplete   Report Status PENDING  Incomplete  Aerobic/Anaerobic Culture w Gram Stain (surgical/deep wound)     Status: None (Preliminary result)   Collection Time: 08/20/23  2:01 PM   Specimen: Path  fluid; Body Fluid  Result Value Ref Range Status   Specimen Description   Final    FLUID Performed at [facility], [address] [place], [place] [address]    Special Requests   Final    LEFT KNEE SYNOVIUM CULTURE 1 Performed at [facility], [address] [place], [place] [address]    Gram Stain   Final    RARE WBC PRESENT, PREDOMINANTLY MONONUCLEAR RARE GRAM POSITIVE COCCI IN CLUSTERS Performed at [facility], [address], [place], [place] [address]    Culture PENDING  Incomplete   Report Status PENDING  Incomplete  Aerobic/Anaerobic Culture w Gram Stain (surgical/deep wound)     Status: None (Preliminary result)   Collection Time: 08/20/23  2:06 PM   Specimen: Path fluid; Body Fluid  Result Value Ref Range Status   Specimen Description   Final    FLUID Performed at [facility], [address], [place], [place] [address]    Special Requests   Final    LEFT KNEE SYNOVIAL CULTURE 2 Performed at [facility], [address] [place], [place] [address]    Gram Stain   Final    NO WBC SEEN NO ORGANISMS SEEN Performed at [facility], [address] [place], [place] [address]    Culture PENDING  Incomplete   Report Status PENDING  Incomplete  Aerobic/Anaerobic Culture w Gram Stain (surgical/deep wound)     Status: None (Preliminary result)   Collection Time: 08/20/23  2:06 PM   Specimen: Path fluid; Body Fluid  Result Value Ref Range Status   Specimen Description   Final    FLUID Performed at [facility], [address], [place], [place] [address]    Special Requests   Final    LEFT KNEE SYNOVIUM CULTURE 3 Performed at [facility], [address] [place], [place] [address]    Gram Stain   Final    RARE WBC PRESENT, PREDOMINANTLY MONONUCLEAR NO ORGANISMS SEEN Performed at [facility], [address], [place], [place] [address]    Culture PENDING  Incomplete   Report Status  PENDING  Incomplete  Aerobic/Anaerobic  Culture w Gram Stain (surgical/deep wound)     Status: None (Preliminary result)   Collection Time: 08/20/23  2:15 PM   Specimen: Path fluid; Body Fluid  Result Value Ref Range Status   Specimen Description   Final    FLUID Performed at [facility], [address] [place], [place] [address]    Special Requests   Final    LEFT POSTERIOR KNEE CULTURE Performed at [facility], [address] [place], [place] [address]    Gram Stain   Final    NO WBC SEEN NO ORGANISMS SEEN Performed at [facility], [address], [place], [place] [address]    Culture PENDING  Incomplete   Report Status PENDING  Incomplete  Aerobic/Anaerobic Culture w Gram Stain (surgical/deep wound)     Status: None (Preliminary result)   Collection Time: 08/20/23  2:16 PM   Specimen: Path fluid; Body Fluid  Result Value Ref Range Status   Specimen Description   Final    FLUID Performed at [facility], [address] [place], [place] [address]    Special Requests   Final    LEFT KNEE TIBIAL COMONENET CULTURE Performed at [facility], [address] [place], [place] [address]    Gram Stain   Final    NO WBC SEEN NO ORGANISMS SEEN Performed at [facility], [address] [place], [place] [address]    Culture PENDING  Incomplete   Report Status PENDING  Incomplete    IMAGING: DG Chest 2 View Result Date: 08/20/2023 CLINICAL DATA:  Back pain.  Chronic pain. EXAM: CHEST - 2 VIEW COMPARISON:  Chest radiograph 12/27/2021 FINDINGS: Dual lead left-sided pacemaker in place. Normal heart size with stable mediastinal contours. Chronic eventration of left hemidiaphragm. No confluent consolidation. No pneumothorax or pleural effusion. No pulmonary edema. Degenerative spurring in the thoracic spine. No evidence of compression deformity or acute osseous findings. IMPRESSION: 1. No acute chest findings. 2. Chronic eventration  of left hemidiaphragm. Electronically Signed   By: Chadwick Colonel M.D.   On: 08/20/2023 15:49   DG Knee Left Port Result Date: 08/20/2023 CLINICAL DATA:  Elective surgery, postop. EXAM: PORTABLE LEFT KNEE - 1-2 VIEW COMPARISON:  Radiograph 10/25/2016 FINDINGS: Left knee arthroplasty in expected alignment. No periprosthetic lucency or fracture. Presumed antibiotic beads project over the distal femur and infrapatellar region. Suprapatellar drain in place. Recent postsurgical change includes air and edema in the soft tissues and joint space. Anterior skin staples. IMPRESSION: Left knee arthroplasty without immediate postoperative complication. Electronically Signed   By: Chadwick Colonel M.D.   On: 08/20/2023 15:47    Assessment:   Gerald Hodges is a 88 y.o. male with prosthetic joint infection with recent aspiration with negative cultures. No preceding infections from history. Last abx I see Rx was in January 2025.  S.p 6/9  debridement and poly exchange. Findings of large amt of cloury purulent fluid and extensive synovitis. Abx beads placed as well. Gram stain with one of several showing GPC in clusters.  Cx pending.   I spoke with son Authur Leghorn [phone] discussed need for PICC line and IV abx for 6 weeks. He has agreed and says family could help with the infusions. I discussed also with his wife via the interpreter.    Recommendations Place PICC Will cont dapto  and ceftriaxone and await cultures Will likley need prolonged IV abx therapy and oral suppressive treatment At this point I anticipate 6 weeks  of daptomycin (if MRSA) or cefazolin  (if MSSA).   Thank you very much for allowing me to participate in the care of this patient. Please call with questions.   Merri Abbe. Harwood Lingo, MD

## 2023-08-20 NOTE — Transfer of Care (Signed)
 Immediate Anesthesia Transfer of Care Note  Patient: Gerald Hodges  Procedure(s) Performed: TOTAL KNEE REVISION (Left: Knee)  Patient Location: PACU  Anesthesia Type:General  Level of Consciousness: drowsy, patient cooperative, and responds to stimulation  Airway & Oxygen Therapy: Patient Spontanous Breathing and Patient connected to face mask oxygen  Post-op Assessment: Report given to RN, Post -op Vital signs reviewed and stable, Patient moving all extremities, and Patient able to stick tongue midline  Post vital signs: Reviewed and stable  Last Vitals:  Vitals Value Taken Time  BP    Temp    Pulse    Resp    SpO2      Last Pain:  Vitals:   08/20/23 1135  TempSrc: Temporal  PainSc: 0-No pain         Complications: No notable events documented.

## 2023-08-20 NOTE — Op Note (Signed)
 Patient Name: Gerald Hodges  ZOX:[id number]  Pre-Operative Diagnosis: Acute/subacute left total knee infection  Post-Operative Diagnosis: Acute/subacute left total knee infection with no evidence of hardware loosening or osteomyelitis  Procedure: Revision left knee arthroplasty with polyethylene and patella button exchange irrigation debridement and placement of antibiotic beads  Components/Implants: Femur N/A   Tibia N/A  Poly LCS RP 17.81mm Large  Patella LCS complete Large Patella metal backed poly replacement  Date of Surgery: 08/20/2023  Surgeon: Venus Ginsberg MD  Assistant: Eliza Gull (present and scrubbed throughout the case, critical for assistance with exposure, retraction, instrumentation, and closure), Sarah PAs   Anesthesiologist: Piscitello  Anesthesia: General   Tourniquet Time: 53 min  EBL: 100cc  IVF: 600cc  Complications: None Brief history: The patient is an 88 year old male with a history of left total knee replacement 7 years ago.  He presented to my office with just under a month of pain swelling fevers and chills.  The knee was aspirated in the office and had findings consistent with a septic joint.  Inflammatory markers coinciding with his aspiration also showed elevations although no bacteria was able to be grown in the lab from this aspiration.  The risks and benefits of left knee irrigation debridement with mobile component exchange and implant retention was discussed with the patient and his family and under shared decision making model the patient and the patient's son agreed with the plan to move forward with revision left knee replacement with irrigation debridement and placement of antibiotic beads.  We also discussed the possibility that the metal was loose we will consider changing out the metal components and doing a mobile spacer.     Description of procedure: The patient was brought to the operating room where laterality was confirmed by  all those present to be the left side.    Spinal anesthesia was administered and the patient received an intravenous dose of antibiotics for surgical prophylaxis and a dose of tranexamic acid .  Patient is positioned supine on the operating room table with all bony prominences well-padded.  A well-padded tourniquet was applied to the left thigh.  The knee was then prepped and draped in usual sterile fashion with multiple layers of adhesive and nonadhesive drapes.  All of those present in the operating room participated in a surgical timeout laterality and patient were confirmed.     The leg was gravity exsanguinated.  The tourniquet was inflated to a pressure of 250 mmHg.  The prior incision was marked out using a marking pen and a midline incision was made through the old incision down to the extensor retinaculum.  After exposure of the extensor mechanism the medial parapatellar arthrotomy was performed with a scalpel and electrocautery extending down medial and distal to the tibial tubercle taking care to avoid incising the patellar tendon.  Upon opening the joint there was a large gush of cloudy purulent fluid.  Sample of fluid was taken and sent to the lab for growth.  This time the rest of the knee was examined and there is found to be a large amount of synovitis with inflamed tissues and unhealthy appearing synovium.  The interfaces between the femoral component the tibial component and the patella component and bone were carefully evaluated and the components were found to be well-fixed with no evidence of any bony involvement.  After further debridement later in the case all of these interfaces were checked again and found to have no evidence of osteomyelitis or bone involvement or any loosening  of the components.   A standard medial release was performed over the proximal tibia exposing the existing tibial baseplate and polyethylene.  The knee was brought into extension in order to excise the scar  tissue around the patellar tendon with care taken to avoid injuring the patellar tendon .     Scar excision and synovectomy was performed circumferentially around the knee removing all inflamed and unhealthy appearing tissues.  Care was taken to maintain the medial and lateral collateral complexes and the extensor mechanism.   The polyethylene button from the patella was removed as was the polyethylene from the tibiofemoral space.  3 L of pulsatile irrigation were run through the knee and blunt and sharp and electrocautery debridement were used to remove any any healthy appearing tissues.  Versajet was then used to clean all the exposed surfaces of the metal interfaces with the bone and exposed on healthy synovial tissues.  The knee was then soaked in dilute Betadine solution.  During this time suction Bovie, and a new drape were changed and placed around the knee and all team changed gloves.  A new table was brought over with clean instruments.  The knee was reexposed the Betadine was removed.  The knee was reirrigated and checked for any remaining unhealthy tissue.  All of the tissues appeared healthy with no remaining evidence of infection.  The bony interfaces with the metal remained stable with no evidence of component loosening.  The knee was then irrigated with Irrisept, chlorhexidine -based solution.  It was allowed to soak for a few minutes and then was irrigated out with a few more liters of normal saline.  At this time trial polyethylene liners were placed the knee was taken through range of motion and found to be stable with the next size up poly.  A real 17.5 mm large rotating platform polyethylene was then implanted.  The knee was found to be stable through range of motion and in flexion.  A new poly replacement was snapped into place on the patella and tracked well without any subluxation or dislocation.  The knee was then irrigated again with 3 more liters of normal saline the tourniquet was  dropped and all bleeding vessels were carefully coagulated.  A Hemovac drain was placed superior laterally to the knee and a dose of stimulant beads with vancomycin  and tobramycin was placed within the joint.  The capsule was closed with interrupted figure-of-eight #1 PDS sutures.  The capsule arthrotomy was checked after closure and found to be watertight through range of motion with no entrapment of the drain in any of the sutures.  Subcutaneous tissues were irrigated again and were closed with 0 PDS 2-0 Monocryl and staples. The knee was dressed with Xeroform, honeycomb dressing Ace wrap and a Polar Care unit.  Sponge, needle, and Lap counts were all correct at the end of the case.   The patient was transferred off of the operating room table to a hospital bed, good pulses were found distally on the operative side.  The patient was transferred to the recovery room in stable condition.

## 2023-08-20 NOTE — Interval H&P Note (Signed)
 Patient history and physical updated. Consent reviewed including risks, benefits, and alternatives to surgery. Patient and family agrees with above plan to proceed with left knee revision with polyetheylene and patella exchange with antibiotic bead placement, possible two component revision antibiotic spacer placement.

## 2023-08-20 NOTE — Progress Notes (Signed)
 Pharmacy Antibiotic Note  Gerald Hodges is a 88 y.o. male admitted on 08/20/2023 with prosthetic joint infection of L knee.  Aspiration in orthopedic office 5/27 yielded ~16,500 WBC predominantly neutrophils. Culture was unrevealing.  He is s/p I&D with polyexchange 08/20/23  Pharmacy has been consulted for daptomycin dosing. Also on ceftriaoxne.  ID following  Today, 08/20/2023 D#0 antibiotics Renal: last SCr 5/7 = 1.45 CBC WNL Temp - afebrile 5/27 knee aspirate (outpatient): No growth 6/9 multiple intra-op cultures pending Baseline CK - ordered  Plan: Daptomycin 600mg  (8mg /kg) IV q48h based on estimated CrCl = 19 ml/min with SCr 1.45 CK in am Do not see on statin prior to admit Follow SCr - order for tomorrow Follow cultures and antibiotic plan  Height: 5\' 3"  (160 cm) Weight: 78.7 kg (173 lb 6.4 oz) IBW/kg (Calculated) : 56.9  Temp (24hrs), Avg:97.3 F (36.3 C), Min:97 F (36.1 C), Max:97.5 F (36.4 C)  Recent Labs  Lab 08/16/23 1507  WBC 5.9    CrCl cannot be calculated (Patient's most recent lab result is older than the maximum 21 days allowed.).    Allergies  Allergen Reactions   Asa [Aspirin ] Cough   Zestril  [Lisinopril ] Itching    Throat itching     Antimicrobials this admission: Daptomycin  6/9 >>  ceftriaxone 6/9 >>    Thank you for allowing pharmacy to be a part of this patient's care.  Gerald Hodges, PharmD, BCPS, BCIDP Work Cell: [phone] 08/20/2023 4:35 PM

## 2023-08-20 NOTE — Plan of Care (Signed)

## 2023-08-20 NOTE — H&P (Signed)
 History of Present Illness: The patient is an 88 y.o. male seen in clinic today for follow-up evaluation of his left knee. Patient is accompanied by his wife who assists with history and translation also using video translator Kayleen Party [phone]. Patient reports ongoing pain for now 1 month with intermittent swelling of the left knee fevers body aches and chills. He has been taking Tylenol  with some relief. His knee was replaced in 2018 he had no initial issues with healing or any infections after surgery. No trauma to the knee. Has not improved since his last visit. Denies any chest pain or shortness of breath.  Past Medical History: No past medical history on file.  Past Surgical History: History reviewed. No pertinent surgical history.  Past Family History: History reviewed. No pertinent family history.  Medications: Current Outpatient Medications  Medication Sig Dispense Refill  acetaminophen  (TYLENOL ) 325 MG tablet Take by mouth  albuterol  MDI, PROVENTIL , VENTOLIN , PROAIR , HFA 90 mcg/actuation inhaler Inhale 2 Inhalations into the lungs every 6 (six) hours as needed  allopurinol  (ZYLOPRIM ) 100 MG tablet Take 100 mg by mouth once daily.  AMIOdarone  (PACERONE ) 200 MG tablet Take 200 mg by mouth once daily  amLODIPine  (NORVASC ) 10 MG tablet Take 10 mg by mouth once daily. (Patient not taking: Reported on 11/16/2022)  atorvastatin  (LIPITOR) 40 MG tablet Take 40 mg by mouth once daily. (Patient not taking: Reported on 11/16/2022)  calcium  carbonate-vitamin D3 (CALTRATE 600+D) 600 mg-10 mcg (400 unit) tablet  carvediloL  (COREG ) 3.125 MG tablet Take 3.125 mg by mouth 2 (two) times daily  clobetasoL (TEMOVATE) 0.05 % cream  clopidogreL  (PLAVIX ) 75 mg tablet Take 75 mg by mouth once daily  EUCERIN ORIGINAL lotion  FUROsemide  (LASIX ) 20 MG tablet Take 20 mg by mouth once daily  hydrocodone -homatropine (HYCODAN) 5-1.5 mg/5 mL syrup Take 5 mLs by mouth every 6 (six) hours as needed for Cough. 120 mL 0   isosorbide  mononitrate (IMDUR ) 60 MG ER tablet Take 60 mg by mouth once daily.  levothyroxine  (SYNTHROID ) 125 MCG tablet  losartan  (COZAAR ) 25 MG tablet Take 1 tablet by mouth once daily  magnesium  oxide (MAG-OX) 400 mg (241.3 mg magnesium ) tablet  neomycin -polymyxin-dexAMETHasone  (MAXITROL) ophthalmic suspension  nitroGLYcerin  (NITROSTAT ) 0.4 MG SL tablet  omeprazole  (PRILOSEC) 20 MG DR capsule omeprazole  20 mg capsule,delayed release take 1 capsule by mouth once daily (Patient not taking: Reported on 11/16/2022)  pantoprazole  (PROTONIX ) 40 MG DR tablet Take 40 mg by mouth once daily  rosuvastatin  (CRESTOR ) 10 MG tablet Take 10 MG (one tablet) by mouth 3 days a week (Monday, Wednesday, Friday).  vits A and D-white pet-lanolin (DESITIN CLEAR) ointment   No current facility-administered medications for this visit.   Allergies: Allergies  Allergen Reactions  Aspirin  Cough and Other (See Comments)  Lisinopril  Itching and Other (See Comments)  Throat itching  Throat itching Throat itching    Review of Systems:  A comprehensive 14 point ROS was performed, reviewed, and the pertinent orthopaedic findings are documented in the HPI.  Physical Exam: General/Constitutional: No apparent distress: well-nourished and well developed. Eyes: Pupils equal, round with synchronous movement. Pulmonary exam: Lungs clear to auscultation bilaterally no wheezing rales or rhonchi Cardiac exam: Regular rate and rhythm no obvious murmurs rubs or gallops. Integumentary: No impressive skin lesions present, except as noted in detailed exam. Neuro/Psych: Normal mood and affect, oriented to person, place and time.  Comprehensive Knee Exam: Gait Non-antalgic and fluid  Alignment Neutral   Inspection Left  Skin Normal appearance with  no obvious deformity. Mild erythema in the infrapatellar region blanches no streaking Healed midline incision  Soft Tissue No focal soft tissue swelling  Quad Atrophy None    Palpation  Left  Tenderness Mild tenderness diffusely no point tenderness no bony tenderness  Crepitus No patellofemoral or tibiofemoral crepitus  Effusion None   Range of Motion Left  Flexion 0-110  Extension Full knee extension without hyperextension   Ligamentous Exam Left  AP motion in flexion 2-44mm  Varus/Valgus 0 1-65mm  Varus/Valgus 90 1-47mm    Neurovascular Left  Quadriceps Strength 5/5  Hamstring Strength 5/5  Hip Abductor Strength 4/5  Distal Motor Normal  Distal Sensory Normal light touch sensation  Distal Pulses Normal    Imaging Studies: I have reviewed AP, lateral, and sunrise weight bearing knee X-rays (3 views) of the left knee ordered and taken today in the office show the patient is s/p total knee arthroplasty with cemented components position. No obvious evidence of periprosthetic fracture or loosening tibial component in significant amount of flexion jon lateral view. Patella is resurfaced the metal backing slightly lateral tracking on the sunrise view. No fractures or dislocations noted.   Depuy LCS Femur/Patella size LARGE, Tibia size # 5, Rotating platform polyethylene size 15 mm   Left knee aspiration Component 08/07/2023   Color, Synovial RED Abnormal   Appearance-Synovial TURBID Abnormal   Crystals, Fluid NO CRYSTALS SEEN  WBC, Synovial 16,497 High   Neutrophil, Synovial 98  Lymphocytes-Synovial Fld 0  Monocyte-Macrophage-Synovial Fluid 2  Eosinophils-Synovial 0   ESR- 31 CRP- 79  Assessment:  Left knee prosthetic joint infection  Plan: Deontrae is an 88 year old male who presents with painful left total knee and aspiration and labs concerning for a prosthetic joint infection. Patient has had pain chills and swelling intermittently for about 1 month now. I called and discussed the case with the patient's son at the request [name]. We discussed treatment options but given a positive ESR, CRP and cell count despite a negative culture we  would still consider this a positive workup for joint infection. We discussed acute versus chronic knee infection and the difficulty with eradication and treatment options. Given the patient's age medical comorbidities and the shorter timeframe of pain and swelling of about 4 weeks. We discussed trying a device retention procedure with polyethylene exchange. We did discuss that this would have a lower success rate than a 1 stage or two-stage exchange but would be lower potential morbidity to him. We discussed extended antibiotic treatment after surgery and the possibility of a PICC line.  The hospitalization and post-operative care and rehabilitation were also discussed. The use of perioperative antibiotics and DVT prophylaxis were discussed. The risk, benefits and alternatives to a surgical intervention were discussed at length with the patient. The patient was also advised of risks related to the medical comorbidities and elevated body mass index (BMI). A lengthy discussion took place to review the most common complications including but not limited to: stiffness, loss of function, complex regional pain syndrome, deep vein thrombosis, pulmonary embolus, heart attack, stroke, infection, wound breakdown, numbness, intraoperative fracture, damage to nerves, tendon,muscles, arteries or other blood vessels, death and other possible complications from anesthesia. The patient was told that we will take steps to minimize these risks by using sterile technique, antibiotics and DVT prophylaxis when appropriate and follow the patient postoperatively in the office setting to monitor progress. The possibility of recurrent pain, no improvement in pain and actual worsening of pain were also discussed  with the patient.   The discharge plan of care focused on the patient going home following surgery. The patient was encouraged to make the necessary arrangements to have someone stay with them when they are discharged home.    The benefits of surgery were discussed with the patient including the potential for improving the patient's current clinical condition through operative intervention. Alternatives to surgical intervention including continued conservative management were also discussed in detail. All questions were answered to the satisfaction of the patient. The patient participated and agreed to the plan of care as well as the use of the recommended implants for their total knee replacement surgery. An information packet was given to the patient to review prior to surgery.   The patient received cardiac clearance for surgery.  I reviewed the indications risks and benefits with the patient and his son and wife.  At this time the patient was unable to fully answer questions appropriately enough for consent so we had him sign as well as a telephone consent with the son over the phone who speaks [place].  Translator was also used to discuss everything with the wife and make sure she understood all the risks and benefits of the procedure.  We discussed that the plan will be to remove the polyethylene clean the knee and place a new poly on the patella and in the joint space as long as there is no evidence of involvement of the bone loosening of the components or any evidence of osteomyelitis.  Should any of those be found we may consider doing a 1-1/2 or two-stage revision with a mobile functional antibiotic spacer today.  We did discuss that I would likely place a drain and antibiotic beads in the knee and they are okay with that.  All questions answered the patient, wife and son agree with the plan for left knee revision irrigation and debridement with antibiotic bead placement.

## 2023-08-20 NOTE — Anesthesia Preprocedure Evaluation (Addendum)
 Anesthesia Evaluation  Patient identified by MRN, date of birth, ID band Patient awake    Reviewed: Allergy & Precautions, NPO status , Patient's Chart, lab work & pertinent test results  History of Anesthesia Complications Negative for: history of anesthetic complications  Airway Mallampati: III  TM Distance: <3 FB Neck ROM: full    Dental  (+) Chipped, Poor Dentition, Missing   Pulmonary neg pulmonary ROS, neg shortness of breath   Pulmonary exam normal        Cardiovascular Exercise Tolerance: Good hypertension, (-) angina + CAD and + Past MI  Normal cardiovascular exam     Neuro/Psych TIA negative psych ROS   GI/Hepatic Neg liver ROS,GERD  Controlled,,  Endo/Other  Hypothyroidism    Renal/GU Renal disease     Musculoskeletal  (+) Arthritis ,    Abdominal   Peds  Hematology negative hematology ROS (+)   Anesthesia Other Findings Patient has cardiac clearance for this procedure.   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 b. TTE 12/17/2019: EF 55-60%;               c. 03/2021 Echo: EF 55-60%, GrI DD, mod MR; d. 05/2021               cMRI: LVEF 46%, RVEF 46%, >50% thickness subendocardial               LGE sugg of prior infarct in LCX distribution. At least               mod MR. No date: Abdominal aortic atherosclerosis (HCC)     Comment:  a. MPI 12/12/2019--> very mild. No date: Anginal pain (HCC) No date: Bilateral inguinal hernia No date: BPH (benign prostatic hyperplasia) No date: Cerebral microvascular disease No date: CKD (chronic kidney disease), stage III (HCC) No date: Diverticulosis No date: GERD (gastroesophageal reflux disease) No date: Gout 09/10/2020: History of 2019 novel coronavirus disease (COVID-19) 12/26/2021: History of placement of internal cardiac defibrillator     Comment:  a.) Armed forces logistics/support/administrative officer DR [phone] (SN: [phone])               placed 12/26/2021 No date: Hyperlipidemia No date: Hypertension No date: Hypothyroidism No date: 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 03/2021 Echo: Mod MR; b. 05/2021 cMRI: at least mod MR. No date: NICM (nonischemic cardiomyopathy) (HCC) No date: Nonosbructive CAD (coronary artery disease)     Comment:  a. 03/2021 Cath: LM nl, LAD 49m, D1 nl, LCX min irregs,               RCA 20p/d, RPDA nl, RPAV min irregs, RPL1/2/3 nl. 04/02/2021: NSTEMI (non-ST elevated myocardial infarction) [facility])     Comment:  a.) troponin peaked at 4508 ng/L --> felt to be demand               ischemia in setting of sustained VT --> cMRI in 05/2021               with LGE pattern suggestive of subendocardial infarct in               LCx territory No date: Osteoarthritis of left knee No date: PAC (premature atrial contraction) No date: Pre-diabetes No date: Psoriasis No date: PSVT (paroxysmal supraventricular tachycardia) (HCC) No date: Recurrent umbilical hernia  Comment:  a.) repaired 01/01/2020; recurred; b.) repeat repair               12/14/2020 No date: Renal cyst No date: SNHL (sensory-neural hearing loss); wears hearing aids 04/02/2021: Sustained ventricular tachycardia (HCC)     Comment:  a.) s/p AICD placement 2022: TB lung, latent No date: TIA (transient ischemic attack)  Past Surgical History: No date: BUNIONECTOMY; Bilateral No date: CATARACT EXTRACTION W/ INTRAOCULAR LENS  IMPLANT, BILATERAL;  Bilateral 12/26/2021: ICD IMPLANT; N/A     Comment:  Procedure: ICD IMPLANT;  Surgeon: Boyce Byes,               MD;  Location: [facility] INVASIVE CV LAB;  Service:               Cardiovascular;  Laterality: N/A; 04/04/2021: LEFT HEART CATH AND CORONARY ANGIOGRAPHY; N/A     Comment:  Procedure: LEFT HEART CATH AND CORONARY ANGIOGRAPHY;                Surgeon: Wenona Hamilton, MD;  Location: [facility] INVASIVE               CV LAB;  Service: Cardiovascular;  Laterality: N/A; 10/25/2016: TOTAL KNEE ARTHROPLASTY; Left     Comment:  Procedure: TOTAL KNEE ARTHROPLASTY;  Surgeon: Marlynn Singer, MD;  Location: [facility] ORS;  Service: Orthopedics;                Laterality: Left; 01/01/2020: UMBILICAL HERNIA REPAIR; N/A     Comment:  Procedure: HERNIA REPAIR UMBILICAL ADULT;  Surgeon:               Emmalene Hare, MD;  Location: [facility] ORS;  Service:               General;  Laterality: N/A; 01/01/2020: XI ROBOTIC ASSISTED INGUINAL HERNIA REPAIR WITH MESH; Left     Comment:  Procedure: XI ROBOTIC ASSISTED INGUINAL HERNIA REPAIR               WITH MESH;  Surgeon: Emmalene Hare, MD;  Location: [facility]               ORS;  Service: General;  Laterality: Left;  BMI    Body Mass Index: 30.72 kg/m      Reproductive/Obstetrics negative OB ROS                             Anesthesia Physical Anesthesia Plan  ASA: 4  Anesthesia Plan: General ETT   Post-op Pain Management:    Induction: Intravenous  PONV Risk Score and Plan: Ondansetron , Dexamethasone , Midazolam  and Treatment may vary due to age or medical condition  Airway Management Planned: Oral ETT  Additional Equipment:   Intra-op Plan:   Post-operative Plan: Extubation in OR  Informed Consent: I have reviewed the patients History and Physical, chart, labs and discussed the procedure including the risks, benefits and alternatives for the proposed anesthesia with the patient or authorized representative who has indicated his/her understanding and acceptance.     Dental Advisory Given and Interpreter used for interview  Plan Discussed with: Anesthesiologist, CRNA and Surgeon  Anesthesia Plan Comments: (Not a candidate for spinal 2/2 anticoagulation use  Patient, son and wife consented for risks of anesthesia including but not limited to:  - adverse reactions to  medications - damage to eyes,  teeth, lips or other oral mucosa - nerve damage due to positioning  - sore throat or hoarseness - Damage to heart, brain, nerves, lungs, other parts of body or loss of life  They voiced understanding and assent.)       Anesthesia Quick Evaluation

## 2023-08-21 ENCOUNTER — Encounter: Payer: Self-pay | Admitting: Orthopedic Surgery

## 2023-08-21 ENCOUNTER — Other Ambulatory Visit: Payer: Self-pay

## 2023-08-21 DIAGNOSIS — I252 Old myocardial infarction: Secondary | ICD-10-CM | POA: Diagnosis not present

## 2023-08-21 DIAGNOSIS — I340 Nonrheumatic mitral (valve) insufficiency: Secondary | ICD-10-CM | POA: Diagnosis present

## 2023-08-21 DIAGNOSIS — E039 Hypothyroidism, unspecified: Secondary | ICD-10-CM | POA: Diagnosis present

## 2023-08-21 DIAGNOSIS — I2510 Atherosclerotic heart disease of native coronary artery without angina pectoris: Secondary | ICD-10-CM | POA: Diagnosis present

## 2023-08-21 DIAGNOSIS — Z95810 Presence of automatic (implantable) cardiac defibrillator: Secondary | ICD-10-CM | POA: Diagnosis not present

## 2023-08-21 DIAGNOSIS — Z8616 Personal history of COVID-19: Secondary | ICD-10-CM | POA: Diagnosis not present

## 2023-08-21 DIAGNOSIS — N400 Enlarged prostate without lower urinary tract symptoms: Secondary | ICD-10-CM | POA: Diagnosis present

## 2023-08-21 DIAGNOSIS — I5022 Chronic systolic (congestive) heart failure: Secondary | ICD-10-CM | POA: Diagnosis present

## 2023-08-21 DIAGNOSIS — Z79890 Hormone replacement therapy (postmenopausal): Secondary | ICD-10-CM | POA: Diagnosis not present

## 2023-08-21 DIAGNOSIS — T8459XD Infection and inflammatory reaction due to other internal joint prosthesis, subsequent encounter: Secondary | ICD-10-CM | POA: Diagnosis present

## 2023-08-21 DIAGNOSIS — Y831 Surgical operation with implant of artificial internal device as the cause of abnormal reaction of the patient, or of later complication, without mention of misadventure at the time of the procedure: Secondary | ICD-10-CM | POA: Diagnosis present

## 2023-08-21 DIAGNOSIS — L03116 Cellulitis of left lower limb: Secondary | ICD-10-CM | POA: Diagnosis present

## 2023-08-21 DIAGNOSIS — K219 Gastro-esophageal reflux disease without esophagitis: Secondary | ICD-10-CM | POA: Diagnosis present

## 2023-08-21 DIAGNOSIS — E785 Hyperlipidemia, unspecified: Secondary | ICD-10-CM | POA: Diagnosis present

## 2023-08-21 DIAGNOSIS — I130 Hypertensive heart and chronic kidney disease with heart failure and stage 1 through stage 4 chronic kidney disease, or unspecified chronic kidney disease: Secondary | ICD-10-CM | POA: Diagnosis present

## 2023-08-21 DIAGNOSIS — M65862 Other synovitis and tenosynovitis, left lower leg: Secondary | ICD-10-CM | POA: Diagnosis present

## 2023-08-21 DIAGNOSIS — N183 Chronic kidney disease, stage 3 unspecified: Secondary | ICD-10-CM | POA: Diagnosis present

## 2023-08-21 DIAGNOSIS — Z79899 Other long term (current) drug therapy: Secondary | ICD-10-CM | POA: Diagnosis not present

## 2023-08-21 DIAGNOSIS — Z7902 Long term (current) use of antithrombotics/antiplatelets: Secondary | ICD-10-CM | POA: Diagnosis not present

## 2023-08-21 DIAGNOSIS — Z8673 Personal history of transient ischemic attack (TIA), and cerebral infarction without residual deficits: Secondary | ICD-10-CM | POA: Diagnosis not present

## 2023-08-21 DIAGNOSIS — I428 Other cardiomyopathies: Secondary | ICD-10-CM | POA: Diagnosis present

## 2023-08-21 DIAGNOSIS — T8454XA Infection and inflammatory reaction due to internal left knee prosthesis, initial encounter: Secondary | ICD-10-CM | POA: Diagnosis present

## 2023-08-21 LAB — CBC
HCT: 31.7 % — ABNORMAL LOW (ref 39.0–52.0)
Hemoglobin: 10.9 g/dL — ABNORMAL LOW (ref 13.0–17.0)
MCH: 31.1 pg (ref 26.0–34.0)
MCHC: 34.4 g/dL (ref 30.0–36.0)
MCV: 90.3 fL (ref 80.0–100.0)
Platelets: 387 10*3/uL (ref 150–400)
RBC: 3.51 MIL/uL — ABNORMAL LOW (ref 4.22–5.81)
RDW: 12.6 % (ref 11.5–15.5)
WBC: 7.4 10*3/uL (ref 4.0–10.5)
nRBC: 0 % (ref 0.0–0.2)

## 2023-08-21 LAB — BASIC METABOLIC PANEL WITH GFR
Anion gap: 9 (ref 5–15)
BUN: 17 mg/dL (ref 8–23)
CO2: 22 mmol/L (ref 22–32)
Calcium: 8.3 mg/dL — ABNORMAL LOW (ref 8.9–10.3)
Chloride: 98 mmol/L (ref 98–111)
Creatinine, Ser: 1.13 mg/dL (ref 0.61–1.24)
GFR, Estimated: >60 mL/min (ref >60)
Glucose, Bld: 142 mg/dL — ABNORMAL HIGH (ref 70–99)
Potassium: 4.1 mmol/L (ref 3.5–5.1)
Sodium: 129 mmol/L — ABNORMAL LOW (ref 135–145)

## 2023-08-21 LAB — CK: Total CK: 23 U/L — ABNORMAL LOW (ref 49–397)

## 2023-08-21 MED ORDER — SODIUM CHLORIDE 0.9 % IV SOLN
8.0000 mg/kg | Freq: Every day | INTRAVENOUS | Status: DC
  Administered 2023-08-21 – 2023-08-22 (×2): 600 mg via INTRAVENOUS
  Filled 2023-08-21 (×3): qty 12

## 2023-08-21 NOTE — Progress Notes (Signed)
 PT Cancellation Note  Patient Details Name: Gerald Hodges MRN: [id number] DOB: 09-24-1935   Cancelled Treatment:    Reason Eval/Treat Not Completed: Other (comment). Session attempted, however pt currently eating breakfast and requested to return once finished. Will re-attempt.   [name] 08/21/2023, 9:37 AM Amparo Balk, PT, DPT, GCS [phone]

## 2023-08-21 NOTE — Plan of Care (Signed)

## 2023-08-21 NOTE — Plan of Care (Signed)
 Progressing towards discharge

## 2023-08-21 NOTE — Progress Notes (Signed)
 Subjective: 1 Day Post-Op Procedure(s) (LRB): TOTAL KNEE REVISION (Left) Patient reports pain as moderate.   Patient is well, and has had no acute complaints or problems.  Use the interpreter through [facility] health.  Pain with moving. Plan is to go Rehab versus home after hospital stay. Negative for chest pain and shortness of breath Fever: no Gastrointestinal: Negative for nausea and vomiting  Objective: Vital signs in last 24 hours: Temp:  [97 F (36.1 C)-98.7 F (37.1 C)] 98.7 F (37.1 C) (06/10 0521) Pulse Rate:  [58-69] 66 (06/10 0521) Resp:  [11-20] 18 (06/09 2005) BP: (135-177)/(72-90) 160/89 (06/10 0521) SpO2:  [96 %-100 %] 97 % (06/10 0521) Weight:  [78.7 kg] 78.7 kg (06/09 1135)  Intake/Output from previous day:  Intake/Output Summary (Last 24 hours) at 08/21/2023 0704 Last data filed at 08/21/2023 0606 Gross per 24 hour  Intake 2485 ml  Output 875 ml  Net 1610 ml    Intake/Output this shift: No intake/output data recorded.  Labs: No results for input(s): "HGB" in the last 72 hours. No results for input(s): "WBC", "RBC", "HCT", "PLT" in the last 72 hours. No results for input(s): "NA", "K", "CL", "CO2", "BUN", "CREATININE", "GLUCOSE", "CALCIUM " in the last 72 hours. No results for input(s): "LABPT", "INR" in the last 72 hours.   EXAM General - Patient is Alert and Oriented yet hard of hearing Extremity - Neurovascular intact Sensation intact distally Dorsiflexion/Plantar flexion intact Compartment soft Dressing/Incision - clean, dry, with a Hemovac in place and left intact. Motor Function - intact, moving foot and toes well on exam.   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 b. TTE 12/17/2019: EF 55-60%; c. 03/2021 Echo: EF 55-60%, GrI DD, mod MR; d. 05/2021 cMRI: LVEF 46%, RVEF 46%, >50% thickness subendocardial LGE sugg of prior infarct in LCX distribution. At least mod  MR.   Abdominal aortic atherosclerosis (HCC)    a. MPI 12/12/2019--> very mild.   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 03/2021 Echo: Mod MR; b. 05/2021 cMRI: at least mod MR.   NICM (nonischemic cardiomyopathy) (HCC)    Nonosbructive CAD (coronary artery disease)    a. 03/2021 Cath: LM nl, LAD 75m, D1 nl, LCX min irregs, RCA 20p/d, RPDA nl, RPAV min irregs, RPL1/2/3 nl.   NSTEMI (non-ST elevated myocardial infarction) (HCC) 04/02/2021   a.) troponin peaked at 4508 ng/L --> felt to be demand ischemia in setting of sustained VT --> cMRI in 05/2021 with LGE pattern suggestive of subendocardial infarct in LCx territory   Osteoarthritis of left knee    PAC (premature atrial contraction)    Pre-diabetes    Psoriasis    PSVT (paroxysmal supraventricular tachycardia) (HCC)    Recurrent umbilical hernia    a.) repaired 01/01/2020; recurred; b.) repeat repair 12/14/2020  Renal cyst    SNHL (sensory-neural hearing loss); wears hearing aids    Sustained ventricular tachycardia (HCC) 04/02/2021   a.) s/p AICD placement   TB lung, latent 2022   TIA (transient ischemic attack)     Assessment/Plan: 1 Day Post-Op Procedure(s) (LRB): TOTAL KNEE REVISION (Left) Principal Problem:   S/P revision of total knee, left  Estimated body mass index is 30.72 kg/m as calculated from the following:   Height as of this encounter: 5\' 3"  (1.6 m).    Weight as of this encounter: 78.7 kg. Advance diet Up with therapy  Status post left knee revision with incision and drainage for cellulitis.  IV antibiotic therapy.  Awaiting consultation by infectious disease.  DVT Prophylaxis - Lovenox  and Plavix  Weight-Bearing as tolerated to left leg  Bert Britain, PA-C Orthopaedic Surgery 08/21/2023, 7:04 AM

## 2023-08-21 NOTE — Progress Notes (Signed)
 Physical Therapy Treatment Patient Details Name: Gerald Hodges MRN: [id number] DOB: 28-Aug-1935 Today's Date: 08/21/2023   History of Present Illness Pt is an 31 y.old male presenting s/p L total knee revision. I& D secondary to cellulitis. POD 1 at time of eval    PT Comments  Afternoon tx focused progression of mobility and ambulation/stairs. Pt is able to complete bed mobility with minimal verbal and tactile cueing, mainly providing assistance with lowering of L LE. STS transfer performed with CGA and verbal cueing on placement of UE to safely ascend, no LOB. Pt states that his pain heightens when he bends his L knee to sit or stand up but apart from that pain stays at a constant level. Ambulation with RW and chair follow, pt taking x4 standing and x1 seated rest break throughout, pt stating that pain is minimal during this activity. Stair training consisted of education on x1 hand held assist and x1 one hand rail, next session should focus on negotiation with just x1 or x2 hand held assist to mimic home set up. Overall pt is progressing well toward meeting goals and would continue to benefit from skilled PT interventions. PT to follow acutely.    If plan is discharge home, recommend the following: A little help with walking and/or transfers;A little help with bathing/dressing/bathroom;Assistance with cooking/housework;Assist for transportation;Help with stairs or ramp for entrance   Can travel by private vehicle        Equipment Recommendations  None recommended by PT    Recommendations for Other Services       Precautions / Restrictions Precautions Precautions: Fall Recall of Precautions/Restrictions: Intact Restrictions Weight Bearing Restrictions Per Provider Order: Yes LLE Weight Bearing Per Provider Order: Weight bearing as tolerated     Mobility  Bed Mobility Overal bed mobility: Needs Assistance Bed Mobility: Supine to Sit     Supine to sit: Min assist, HOB  elevated     General bed mobility comments: verbal and tactile cueing, requiring less cues throughout movement    Transfers Overall transfer level: Needs assistance Equipment used: Rolling walker (2 wheels) Transfers: Sit to/from Stand, Bed to chair/wheelchair/BSC Sit to Stand: Contact guard assist Stand pivot transfers: Min assist         General transfer comment: verbal and tactile cues given for initiation and continuation of movement and use of RW    Ambulation/Gait Ambulation/Gait assistance: Contact guard assist Gait Distance (Feet): 150 Feet Assistive device: Rolling walker (2 wheels) Gait Pattern/deviations: Step-through pattern, Decreased step length - right, Decreased step length - left, Decreased stride length       General Gait Details: verbal and tactile cueing given throughout ambulation, x4 standing rest break, no LOB, overall minimal pain expressed by pt   Stairs Stairs: Yes Stairs assistance: Contact guard assist Stair Management: One rail Right Number of Stairs: 2 General stair comments: hand held x1 & right hand rail, no LOB, step to pattern, continued edu required   Wheelchair Mobility     Tilt Bed    Modified Rankin (Stroke Patients Only)       Balance Overall balance assessment: Needs assistance Sitting-balance support: Feet supported, Bilateral upper extremity supported Sitting balance-Leahy Scale: Fair Sitting balance - Comments: sitting EOB   Standing balance support: Bilateral upper extremity supported Standing balance-Leahy Scale: Fair Standing balance comment: use of RW, no LOB, verbal and tactile cueing initially for use of DME  Communication Communication Communication: Impaired;Other (comment) (pt is spanish speaking) Factors Affecting Communication: Hearing impaired  Cognition Arousal: Alert Behavior During Therapy: WFL for tasks assessed/performed   PT - Cognitive impairments: No  apparent impairments                         Following commands: Intact      Cueing Cueing Techniques: Verbal cues, Tactile cues, Gestural cues  Exercises Total Joint Exercises Goniometric ROM: -8-80 L Knee    General Comments        Pertinent Vitals/Pain Pain Assessment Pain Assessment: 0-10 Pain Score: 5  Pain Location: L knee Pain Descriptors / Indicators: Discomfort, Aching Pain Intervention(s): Patient requesting pain meds-RN notified, Monitored during session, Ice applied    Home Living                          Prior Function            PT Goals (current goals can now be found in the care plan section) Acute Rehab PT Goals Patient Stated Goal: to walk better and decrease pain in L knee PT Goal Formulation: With patient/family Time For Goal Achievement: 09/04/23 Potential to Achieve Goals: Good Progress towards PT goals: Progressing toward goals    Frequency    BID      PT Plan      Co-evaluation              AM-PAC PT "6 Clicks" Mobility   Outcome Measure  Help needed turning from your back to your side while in a flat bed without using bedrails?: A Little Help needed moving from lying on your back to sitting on the side of a flat bed without using bedrails?: A Little Help needed moving to and from a bed to a chair (including a wheelchair)?: A Little Help needed standing up from a chair using your arms (e.g., wheelchair or bedside chair)?: A Little Help needed to walk in hospital room?: A Little Help needed climbing 3-5 steps with a railing? : A Little 6 Click Score: 18    End of Session Equipment Utilized During Treatment: Gait belt Activity Tolerance: Patient tolerated treatment well Patient left: in bed;with call bell/phone within reach;with family/visitor present Nurse Communication: Mobility status;Patient requests pain meds PT Visit Diagnosis: Unsteadiness on feet (R26.81);Other abnormalities of gait and mobility  (R26.89);Muscle weakness (generalized) (M62.81)     Time: 1330-1410 PT Time Calculation (min) (ACUTE ONLY): 40 min  Charges:    $Gait Training: 38-52 mins PT General Charges $$ ACUTE PT VISIT: 1 Visit                    Shubh Chiara Romero-Perozo, SPT  08/21/2023, 3:49 PM

## 2023-08-21 NOTE — Anesthesia Postprocedure Evaluation (Signed)
 Anesthesia Post Note  Patient: Gerald Hodges  Procedure(s) Performed: TOTAL KNEE REVISION (Left: Knee)  Patient location during evaluation: PACU Anesthesia Type: General Level of consciousness: awake and alert Pain management: pain level controlled Vital Signs Assessment: post-procedure vital signs reviewed and stable Respiratory status: spontaneous breathing, nonlabored ventilation, respiratory function stable and patient connected to nasal cannula oxygen Cardiovascular status: blood pressure returned to baseline and stable Postop Assessment: no apparent nausea or vomiting Anesthetic complications: no   No notable events documented.   Last Vitals:  Vitals:   08/20/23 2337 08/21/23 0521  BP: (!) 158/88 (!) 160/89  Pulse: 68 66  Resp:    Temp: 36.6 C 37.1 C  SpO2: 98% 97%    Last Pain:  Vitals:   08/21/23 0521  TempSrc: Temporal  PainSc:                  Portia Brittle Charels Stambaugh

## 2023-08-21 NOTE — Evaluation (Signed)
 Physical Therapy Evaluation Patient Details Name: Gerald Hodges MRN: [id number] DOB: [birth year] Today's Date: 08/21/2023  History of Present Illness  Pt is an 89 y.old male presenting s/p L total knee revision. I& D secondary to cellulitis. POD 1 at time of eval  Clinical Impression  Pt is a pleasant 88 year old male who was admitted for L total knee revision. Pt performs bed mobility with min assist requiring verbal and tactile cueing for initiation and continuation of movement to maximize safety. Pt overall limited by pain with movement, categorizing it a 6/10 on the NPS. Pt L knee ROM in -8-80 degrees, highlighting some limitations s/p surgery.  Overall STS and stand pivot transfer with min a with multiple verbal and tactile cueing with manipulation of RW to maximize safety. Ambulation deferred this session d/t fatigue. Pt demonstrates deficits with overall L LE strength, ROM, and activity tolerance, highlighting need for skilled PT interventions. PT to follow acutely.        If plan is discharge home, recommend the following: A little help with walking and/or transfers;A little help with bathing/dressing/bathroom;Assistance with cooking/housework;Assist for transportation;Help with stairs or ramp for entrance   Can travel by private vehicle        Equipment Recommendations None recommended by PT  Recommendations for Other Services       Functional Status Assessment Patient has had a recent decline in their functional status and demonstrates the ability to make significant improvements in function in a reasonable and predictable amount of time.     Precautions / Restrictions Precautions Precautions: Fall Recall of Precautions/Restrictions: Intact Restrictions Weight Bearing Restrictions Per Provider Order: Yes LLE Weight Bearing Per Provider Order: Weight bearing as tolerated      Mobility  Bed Mobility Overal bed mobility: Needs Assistance Bed Mobility: Supine to  Sit     Supine to sit: Min assist, HOB elevated     General bed mobility comments: min a for guidance and support of L LE, verbal and tactile cueing given throughout movement    Transfers Overall transfer level: Needs assistance Equipment used: Rolling walker (2 wheels) Transfers: Bed to chair/wheelchair/BSC   Stand pivot transfers: Min assist         General transfer comment: verbal and tactile cues given for initiation and continuation of movement and use of RW    Ambulation/Gait               General Gait Details: deferred this date d/t discomfort  Stairs            Wheelchair Mobility     Tilt Bed    Modified Rankin (Stroke Patients Only)       Balance Overall balance assessment: Needs assistance Sitting-balance support: Bilateral upper extremity supported, Feet supported Sitting balance-Leahy Scale: Fair Sitting balance - Comments: sitting EOB   Standing balance support: Bilateral upper extremity supported Standing balance-Leahy Scale: Fair Standing balance comment: use of RW, able to weight shift laterally onto L LE without knee buckling, min A for verbal cueing                             Pertinent Vitals/Pain Pain Assessment Pain Assessment: 0-10 Pain Score: 6  Pain Location: L knee Pain Descriptors / Indicators: Discomfort, Aching Pain Intervention(s): Monitored during session, Repositioned, Patient requesting pain meds-RN notified, Ice applied    Home Living Family/patient expects to be discharged to:: Private residence Living Arrangements: Spouse/significant other;Children Available Help  at Discharge: Family Type of Home: House Home Access: Stairs to enter Entrance Stairs-Rails: None Entrance Stairs-Number of Steps: 1   Home Layout: One level Home Equipment: Agricultural consultant (2 wheels);Cane - single point;Shower seat Additional Comments: pt typically sits in shower chair to bathe and does ind    Prior Function Prior  Level of Function : Needs assist       Physical Assist : Mobility (physical);ADLs (physical) Mobility (physical): Transfers;Gait ADLs (physical): Bathing;Dressing Mobility Comments: pt able to fully dress himself except for shoes and socks, able to bathe himself, uses hand held assist to go up one step d/t not having rails ADLs Comments: see mobility comment above     Extremity/Trunk Assessment   Upper Extremity Assessment Upper Extremity Assessment: Overall WFL for tasks assessed    Lower Extremity Assessment Lower Extremity Assessment: Generalized weakness;LLE deficits/detail LLE Deficits / Details: extension in supine: -8 degrees, flexion in seated position: 80 degrees LLE: Unable to fully assess due to pain LLE Sensation: decreased proprioception    Cervical / Trunk Assessment Cervical / Trunk Assessment: Kyphotic  Communication   Communication Communication: Impaired;Other (comment) (pt is spanish speaking) Factors Affecting Communication: Hearing impaired    Cognition Arousal: Alert Behavior During Therapy: WFL for tasks assessed/performed   PT - Cognitive impairments: No apparent impairments                         Following commands: Intact       Cueing Cueing Techniques: Verbal cues, Tactile cues, Gestural cues     General Comments      Exercises Total Joint Exercises Goniometric ROM: -8-80 L Knee   Assessment/Plan    PT Assessment Patient needs continued PT services  PT Problem List Decreased strength;Decreased range of motion;Decreased activity tolerance;Decreased balance;Decreased mobility;Decreased knowledge of use of DME;Decreased safety awareness;Impaired sensation       PT Treatment Interventions DME instruction;Gait training;Stair training;Functional mobility training;Therapeutic activities;Therapeutic exercise;Balance training;Patient/family education    PT Goals (Current goals can be found in the Care Plan section)  Acute Rehab  PT Goals Patient Stated Goal: to walk better and decrease pain in L knee PT Goal Formulation: With patient/family Time For Goal Achievement: 09/04/23 Potential to Achieve Goals: Good    Frequency BID     Co-evaluation               AM-PAC PT "6 Clicks" Mobility  Outcome Measure Help needed turning from your back to your side while in a flat bed without using bedrails?: A Little Help needed moving from lying on your back to sitting on the side of a flat bed without using bedrails?: A Little Help needed moving to and from a bed to a chair (including a wheelchair)?: A Lot Help needed standing up from a chair using your arms (e.g., wheelchair or bedside chair)?: A Lot Help needed to walk in hospital room?: A Lot Help needed climbing 3-5 steps with a railing? : A Lot 6 Click Score: 14    End of Session   Activity Tolerance: Patient limited by fatigue Patient left: in chair;with call bell/phone within reach;with family/visitor present Nurse Communication: Mobility status;Patient requests pain meds PT Visit Diagnosis: Unsteadiness on feet (R26.81);Other abnormalities of gait and mobility (R26.89);Muscle weakness (generalized) (M62.81)    Time: 4098-1191 PT Time Calculation (min) (ACUTE ONLY): 31 min   Charges:   PT Evaluation $PT Eval Low Complexity: 1 Low PT Treatments $Therapeutic Activity: 8-22 mins PT General Charges $$  ACUTE PT VISIT: 1 Visit       Blaire Hodsdon Romero-Perozo, SPT  08/21/2023, 1:17 PM

## 2023-08-21 NOTE — Progress Notes (Signed)
 PHARMACY NOTE:  ANTIMICROBIAL RENAL DOSAGE ADJUSTMENT  Current antimicrobial regimen includes a mismatch between antimicrobial dosage and estimated renal function.  As per policy approved by the Pharmacy & Therapeutics and Medical Executive Committees, the antimicrobial dosage will be adjusted accordingly.  Current antimicrobial dosage:  Daptomycin 600 mg IV Q 48 hours   Indication: Left knee PJI   Renal Function:  Estimated Creatinine Clearance: 44.3 mL/min (by C-G formula based on SCr of 1.13 mg/dL). []      On intermittent HD, scheduled: []      On CRRT    Antimicrobial dosage has been changed to:  Daptomycin 600 mg IV Q 24 hours   Additional comments:   Thank you for allowing pharmacy to be a part of this patient's care.  Denson Flake, PharmD, BCPS, BCIDP Infectious Diseases Clinical Pharmacist Phone: [phone] 08/21/2023 9:44 AM

## 2023-08-22 LAB — CBC
HCT: 32.3 % — ABNORMAL LOW (ref 39.0–52.0)
Hemoglobin: 11.2 g/dL — ABNORMAL LOW (ref 13.0–17.0)
MCH: 31.4 pg (ref 26.0–34.0)
MCHC: 34.7 g/dL (ref 30.0–36.0)
MCV: 90.5 fL (ref 80.0–100.0)
Platelets: 414 10*3/uL — ABNORMAL HIGH (ref 150–400)
RBC: 3.57 MIL/uL — ABNORMAL LOW (ref 4.22–5.81)
RDW: 12.5 % (ref 11.5–15.5)
WBC: 11.9 10*3/uL — ABNORMAL HIGH (ref 4.0–10.5)
nRBC: 0 % (ref 0.0–0.2)

## 2023-08-22 MED ORDER — ENOXAPARIN SODIUM 30 MG/0.3ML IJ SOSY: 30.0000 mg | PREFILLED_SYRINGE | INTRAMUSCULAR | 0 refills | Status: AC

## 2023-08-22 MED ORDER — DAPTOMYCIN IV (FOR PTA / DISCHARGE USE ONLY): 700.0000 mg | INTRAVENOUS | 0 refills | Status: AC

## 2023-08-22 MED ORDER — DAPTOMYCIN IV (FOR PTA / DISCHARGE USE ONLY)
600.0000 mg | INTRAVENOUS | 0 refills | Status: DC
Start: 2023-08-22 — End: 2023-08-22

## 2023-08-22 MED ORDER — SODIUM CHLORIDE 0.9% FLUSH: 10.0000 mL | INTRAVENOUS | Status: DC | PRN

## 2023-08-22 MED ORDER — CHLORHEXIDINE GLUCONATE CLOTH 2 % EX PADS
6.0000 | MEDICATED_PAD | Freq: Every day | CUTANEOUS | Status: DC
Start: 2023-08-22 — End: 2023-08-22
  Administered 2023-08-22: 6 via TOPICAL

## 2023-08-22 MED ORDER — CEFTRIAXONE IV (FOR PTA / DISCHARGE USE ONLY): 2.0000 g | INTRAVENOUS | 0 refills | Status: AC

## 2023-08-22 MED ORDER — TRAMADOL HCL 50 MG PO TABS: 50.0000 mg | ORAL_TABLET | Freq: Four times a day (QID) | ORAL | 0 refills | Status: AC | PRN

## 2023-08-22 MED ORDER — HYDROCODONE-ACETAMINOPHEN 5-325 MG PO TABS: 1.0000 | ORAL_TABLET | ORAL | 0 refills | Status: AC | PRN

## 2023-08-22 NOTE — Progress Notes (Signed)
 Peripherally Inserted Central Catheter Placement  The IV Nurse has discussed with the patient and/or persons authorized to consent for the patient, the purpose of this procedure and the potential benefits and risks involved with this procedure.  The benefits include less needle sticks, lab draws from the catheter, and the patient may be discharged home with the catheter. Risks include, but not limited to, infection, bleeding, blood clot (thrombus formation), and puncture of an artery; nerve damage and irregular heartbeat and possibility to perform a PICC exchange if needed/ordered by physician.  Alternatives to this procedure were also discussed.  Bard Power PICC patient education guide, fact sheet on infection prevention and patient information card has been provided to patient /or left at bedside.    PICC Placement Documentation  PICC Single Lumen 08/22/23 Right Brachial 38 cm 0 cm (Active)  Indication for Insertion or Continuance of Line Prolonged intravenous therapies 08/22/23 0958  Exposed Catheter (cm) 0 cm 08/22/23 0958  Site Assessment Clean, Dry, Intact 08/22/23 0958  Line Status Flushed;Saline locked;Blood return noted 08/22/23 0958  Dressing Type Transparent;Securing device 08/22/23 0958  Dressing Status Antimicrobial disc/dressing in place 08/22/23 0958  Line Care Connections checked and tightened 08/22/23 0958  Line Adjustment (NICU/IV Team Only) No 08/22/23 0958  Dressing Intervention New dressing;Adhesive placed at insertion site (IV team only) 08/22/23 0958  Dressing Change Due 08/29/23 08/22/23 0958    Patient's spouse at bedside signed PICC consent via Spanish Interpreter.    Kendrell Lottman 08/22/2023, 9:59 AM

## 2023-08-22 NOTE — Discharge Instructions (Signed)
TOTAL KNEE REPLACEMENT POSTOPERATIVE DIRECTIONS  Knee Rehabilitation, Guidelines Following Surgery  Results after knee surgery are often greatly improved when you follow the exercise, range of motion and muscle strengthening exercises prescribed by your doctor. Safety measures are also important to protect the knee from further injury. Any time any of these exercises cause you to have increased pain or swelling in your knee joint, decrease the amount until you are comfortable again and slowly increase them. If you have problems or questions, call your caregiver or physical therapist for advice.   HOME CARE INSTRUCTIONS  Remove items at home which could result in a fall. This includes throw rugs or furniture in walking pathways.  ICE using the Polar Care unit to the affected knee every three hours for 30 minutes at a time and then as needed for pain and swelling.  Place a dry towel or pillow case over the knee before applying the Polar Care Unit.  Continue to use ice on the knee for pain and swelling from surgery. You may notice swelling that will progress down to the foot and ankle.  This is normal after surgery.  Elevate the leg when you are not up walking on it.   Continue to use the breathing machine which will help keep your temperature down.  It is common for your temperature to cycle up and down following surgery, especially at night when you are not up moving around and exerting yourself.  The breathing machine keeps your lungs expanded and your temperature down. Do not place pillow under knee, focus on keeping the knee straight while resting  DIET You may resume your previous home diet once your are discharged from the hospital.  DRESSING / WOUND CARE / SHOWERING You may change your dressing 3-5 days after surgery.  Then change the dressing every day with sterile gauze.  Please use good hand washing techniques before changing the dressing.  Do not use any lotions or creams on the incision  until instructed by your surgeon. You need to keep your wound dry after being discharged home.  Just keep the incision dry and apply a dry gauze dressing on daily. Change the surgical dressing only if needed and reapply a dry dressing each time.  ACTIVITY Walk with your walker as instructed. Use walker as long as suggested by your caregivers. Avoid periods of inactivity such as sitting longer than an hour when not asleep. This helps prevent blood clots.  You may resume a sexual relationship in one month or when given the OK by your doctor.  You may return to work once you are cleared by your doctor.  Do not drive a car for 6 weeks or until released by you surgeon.  Do not drive while taking narcotics.  WEIGHT BEARING Weight bearing as tolerated with assist device (walker, cane, etc) as directed, use it as long as suggested by your surgeon or therapist, typically at least 4-6 weeks.  POSTOPERATIVE CONSTIPATION PROTOCOL Constipation - defined medically as fewer than three stools per week and severe constipation as less than one stool per week.  One of the most common issues patients have following surgery is constipation.  Even if you have a regular bowel pattern at home, your normal regimen is likely to be disrupted due to multiple reasons following surgery.  Combination of anesthesia, postoperative narcotics, change in appetite and fluid intake all can affect your bowels.  In order to avoid complications following surgery, here are some recommendations in order to help   you during your recovery period.  Colace (docusate) - Pick up an over-the-counter form of Colace or another stool softener and take twice a day as long as you are requiring postoperative pain medications.  Take with a full glass of water daily.  If you experience loose stools or diarrhea, hold the colace until you stool forms back up.  If your symptoms do not get better within 1 week or if they get worse, check with your  doctor.  Dulcolax (bisacodyl) - Pick up over-the-counter and take as directed by the product packaging as needed to assist with the movement of your bowels.  Take with a full glass of water.  Use this product as needed if not relieved by Colace only.   MiraLax (polyethylene glycol) - Pick up over-the-counter to have on hand.  MiraLax is a solution that will increase the amount of water in your bowels to assist with bowel movements.  Take as directed and can mix with a glass of water, juice, soda, coffee, or tea.  Take if you go more than two days without a movement. Do not use MiraLax more than once per day. Call your doctor if you are still constipated or irregular after using this medication for 7 days in a row.  If you continue to have problems with postoperative constipation, please contact the office for further assistance and recommendations.  If you experience "the worst abdominal pain ever" or develop nausea or vomiting, please contact the office immediatly for further recommendations for treatment.  ITCHING  If you experience itching with your medications, try taking only a single pain pill, or even half a pain pill at a time.  You can also use Benadryl over the counter for itching or also to help with sleep.   TED HOSE STOCKINGS Wear the elastic stockings on both legs for six weeks following surgery during the day but you may remove then at night for sleeping.  MEDICATIONS See your medication summary on the "After Visit Summary" that the nursing staff will review with you prior to discharge.  You may have some home medications which will be placed on hold until you complete the course of blood thinner medication.  It is important for you to complete the blood thinner medication as prescribed by your surgeon.  Continue your approved medications as instructed at time of discharge.  PRECAUTIONS If you experience chest pain or shortness of breath - call 911 immediately for transfer to the  hospital emergency department.  If you develop a fever greater that 101 F, purulent drainage from wound, increased redness or drainage from wound, foul odor from the wound/dressing, or calf pain - CONTACT YOUR SURGEON.                                                   FOLLOW-UP APPOINTMENTS Make sure you keep all of your appointments after your operation with your surgeon and caregivers. You should call the office at the above phone number and make an appointment for approximately two weeks after the date of your surgery or on the date instructed by your surgeon outlined in the "After Visit Summary".   RANGE OF MOTION AND STRENGTHENING EXERCISES  Rehabilitation of the knee is important following a knee injury or an operation. After just a few days of immobilization, the muscles of the thigh which   control the knee become weakened and shrink (atrophy). Knee exercises are designed to build up the tone and strength of the thigh muscles and to improve knee motion. Often times heat used for twenty to thirty minutes before working out will loosen up your tissues and help with improving the range of motion but do not use heat for the first two weeks following surgery. These exercises can be done on a training (exercise) mat, on the floor, on a table or on a bed. Use what ever works the best and is most comfortable for you Knee exercises include:  Leg Lifts - While your knee is still immobilized in a splint or cast, you can do straight leg raises. Lift the leg to 60 degrees, hold for 3 sec, and slowly lower the leg. Repeat 10-20 times 2-3 times daily. Perform this exercise against resistance later as your knee gets better.  Quad and Hamstring Sets - Tighten up the muscle on the front of the thigh (Quad) and hold for 5-10 sec. Repeat this 10-20 times hourly. Hamstring sets are done by pushing the foot backward against an object and holding for 5-10 sec. Repeat as with quad sets.  Leg Slides: Lying on your back,  slowly slide your foot toward your buttocks, bending your knee up off the floor (only go as far as is comfortable). Then slowly slide your foot back down until your leg is flat on the floor again. Angel Wings: Lying on your back spread your legs to the side as far apart as you can without causing discomfort.  A rehabilitation program following serious knee injuries can speed recovery and prevent re-injury in the future due to weakened muscles. Contact your doctor or a physical therapist for more information on knee rehabilitation.   IF YOU ARE TRANSFERRED TO A SKILLED REHAB FACILITY If the patient is transferred to a skilled rehab facility following release from the hospital, a list of the current medications will be sent to the facility for the patient to continue.  When discharged from the skilled rehab facility, please have the facility set up the patient's Home Health Physical Therapy prior to being released. Also, the skilled facility will be responsible for providing the patient with their medications at time of release from the facility to include their pain medication, the muscle relaxants, and their blood thinner medication. If the patient is still at the rehab facility at time of the two week follow up appointment, the skilled rehab facility will also need to assist the patient in arranging follow up appointment in our office and any transportation needs.  MAKE SURE YOU:  Understand these instructions.  Get help right away if you are not doing well or get worse.    Pick up stool softner and laxative for home use following surgery while on pain medications. Do not submerge incision under water. Please use good hand washing techniques while changing dressing each day. May shower starting three days after surgery. Please use a clean towel to pat the incision dry following showers. Continue to use ice for pain and swelling after surgery. Do not use any lotions or creams on the incision until  instructed by your surgeon.  

## 2023-08-22 NOTE — Plan of Care (Signed)

## 2023-08-22 NOTE — Progress Notes (Addendum)
 Infectious Disease Long Term IV Antibiotic Orders Dupree Givler Aug 19, 1935  Diagnosis: Prosthetic joint infeciton   Culture results pending  LABS Lab Results  Component Value Date   CREATININE 1.13 08/21/2023   Lab Results  Component Value Date   WBC 11.9 (H) 08/22/2023   HGB 11.2 (L) 08/22/2023   HCT 32.3 (L) 08/22/2023   MCV 90.5 08/22/2023   PLT 414 (H) 08/22/2023   Lab Results  Component Value Date   ESRSEDRATE 2 05/26/2020   Lab Results  Component Value Date   CRP 2.3 09/24/2015    Allergies:  Allergies  Allergen Reactions   Asa [Aspirin ] Cough   Zestril  [Lisinopril ] Itching    Throat itching     Discharge antibiotics Ceftriaxone 2 grams every      24         Hours Daptomycin 700 mg every  25 hours   (weekly CPK)  PICC Care per protocol Labs weekly while on IV antibiotics -FAX weekly labs to [phone]  CBC w diff   Comprehensive met panel CK CRP   Planned duration of antibiotics 6 weeks   Stop date July 21st Follow up clinic date 4 weeks   Eartha Gold, MD

## 2023-08-22 NOTE — Discharge Summary (Signed)
 Physician Discharge Summary  Subjective: 2 Days Post-Op Procedure(s) (LRB): TOTAL KNEE REVISION (Left) Patient reports pain as mild.   Patient seen in rounds with Dr. Clyda Dark. Patient is well, and has had no acute complaints or problems Patient is ready to go home after physical therapy completion today.  The patient has a PICC line that has been placed.  He will go home on IV antibiotic therapy.  Physician Discharge Summary  Patient ID: Gerald Hodges MRN: [id number] DOB/AGE: 05/03/1935 88 y.o.  Admit date: 08/20/2023 Discharge date: 08/22/2023  Admission Diagnoses:  Discharge Diagnoses:  Principal Problem:   S/P revision of total knee, left   Discharged Condition: fair  Hospital Course: The patient is postop day 2 from a left total knee revision.  The patient has ambulated over 150 feet with physical therapy.  The patient had a PICC line placed today for IV antibiotic therapy.  Infectious disease consulted on the patient yesterday and has recommended 6 weeks of daptomycin and Rocephin antibiotic therapy.  The patient is doing well with pain management.  His vitals are stable.  Treatments: surgery:  Revision left knee arthroplasty with polyethylene and patella button exchange irrigation debridement and placement of antibiotic beads   Components/Implants: Femur N/A   Tibia N/A  Poly LCS RP 17.5mm Large  Patella LCS complete Large Patella metal backed poly replacement   Date of Surgery: 08/20/2023   Surgeon: Venus Ginsberg MD   Assistant: Eliza Gull (present and scrubbed throughout the case, critical for assistance with exposure, retraction, instrumentation, and closure), Sarah PAs     Anesthesiologist: Piscitello   Anesthesia: General    Tourniquet Time: 53 min   EBL: 100cc   IVF: 600cc  Discharge Exam: Blood pressure (!) 158/88, pulse 66, temperature 97.9 F (36.6 C), temperature source Oral, resp. rate 15, height 5' 3 (1.6 m), weight 78.7 kg, SpO2  96%.   Disposition: Discharge disposition: 01-Home or Self Care        Allergies as of 08/22/2023       Reactions   Asa [aspirin ] Cough   Zestril  [lisinopril ] Itching   Throat itching        Medication List     TAKE these medications    acetaminophen  325 MG tablet Commonly known as: TYLENOL  Take 2 tablets (650 mg total) by mouth every 4 (four) hours as needed for headache or mild pain.   allopurinol  100 MG tablet Commonly known as: ZYLOPRIM  TAKE 1 TABLET(100 MG) BY MOUTH DAILY What changed:  how much to take how to take this when to take this additional instructions   amiodarone  200 MG tablet Commonly known as: PACERONE  Take 1 tablet (200 mg total) by mouth daily. What changed: when to take this   Calcium  Carb-Cholecalciferol  600-10 MG-MCG Tabs Take 1 tablet by mouth 2 (two) times daily.   carvedilol  3.125 MG tablet Commonly known as: COREG  Take 1 tablet (3.125 mg total) by mouth 2 (two) times daily.   clopidogrel  75 MG tablet Commonly known as: PLAVIX  Take 1 tablet (75 mg total) by mouth daily.   enoxaparin  30 MG/0.3ML injection Commonly known as: LOVENOX  Inject 0.3 mLs (30 mg total) into the skin daily for 14 days.   furosemide  20 MG tablet Commonly known as: LASIX  Take 1 tablet (20 mg total) by mouth daily. What changed: when to take this   HYDROcodone -acetaminophen  5-325 MG tablet Commonly known as: NORCO/VICODIN Take 1-2 tablets by mouth every 4 (four) hours as needed for severe pain (pain score 7-10).  hydrocortisone  cream 1 % Apply 1 Application topically daily as needed for itching.   isosorbide  mononitrate 120 MG 24 hr tablet Commonly known as: IMDUR  Take 1 tablet (120 mg) by mouth once daily AT BEDTIME What changed:  how much to take how to take this when to take this additional instructions   levothyroxine  150 MCG tablet Commonly known as: SYNTHROID  Take 150 mcg by mouth daily before breakfast.   losartan  25 MG  tablet Commonly known as: COZAAR  Take 1 tablet (25 mg total) by mouth daily. What changed: when to take this   Magnesium  Oxide 400 MG Caps Take 1 capsule (400 mg total) by mouth daily.   nitroGLYCERIN  0.4 MG SL tablet Commonly known as: NITROSTAT  Place 1 tablet (0.4 mg total) under the tongue every 5 (five) minutes as needed for chest pain (chest pain). Maximum of 3 doses.   pantoprazole  40 MG tablet Commonly known as: PROTONIX  Take 1 tablet (40 mg total) by mouth daily. What changed: when to take this   traMADol 50 MG tablet Commonly known as: ULTRAM Take 1 tablet (50 mg total) by mouth every 6 (six) hours as needed for moderate pain (pain score 4-6).        Follow-up Information     [name], PA-C Follow up in 2 week(s).   Specialties: Orthopedic Surgery, Emergency Medicine Why: Postoperative care Contact information: [address] [facility] [place] [address] [phone]                 Signed: Sharyon Deis, Conor Lata 08/22/2023, 11:09 AM   Objective: Vital signs in last 24 hours: Temp:  [97.6 F (36.4 C)-98 F (36.7 C)] 97.9 F (36.6 C) (06/11 0830) Pulse Rate:  [66-74] 66 (06/11 0830) Resp:  [14-16] 15 (06/11 0830) BP: (142-158)/(86-88) 158/88 (06/11 0830) SpO2:  [96 %-98 %] 96 % (06/11 0830)  Intake/Output from previous day:  Intake/Output Summary (Last 24 hours) at 08/22/2023 1109 Last data filed at 08/21/2023 1627 Gross per 24 hour  Intake 1072 ml  Output 230 ml  Net 842 ml    Intake/Output this shift: No intake/output data recorded.  Labs: Recent Labs    08/21/23 0650 08/22/23 0603  HGB 10.9* 11.2*   Recent Labs    08/21/23 0650 08/22/23 0603  WBC 7.4 11.9*  RBC 3.51* 3.57*  HCT 31.7* 32.3*  PLT 387 414*   Recent Labs    08/21/23 0650  NA 129*  K 4.1  CL 98  CO2 22  BUN 17  CREATININE 1.13  GLUCOSE 142*  CALCIUM  8.3*   No results for input(s): LABPT, INR in the last 72  hours.  EXAM: General - Patient is Alert and Oriented Extremity - Sensation intact distally Intact pulses distally Dorsiflexion/Plantar flexion intact Compartment soft Incision - clean, dry, no drainage Motor Function -plantarflexion and dorsiflexion are intact.  Ambulating 150 feet with physical therapy.  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 b. TTE 12/17/2019: EF 55-60%; c. 03/2021 Echo: EF 55-60%, GrI DD, mod MR; d. 05/2021 cMRI: LVEF 46%, RVEF 46%, >50% thickness subendocardial LGE sugg of prior infarct in LCX distribution. At least mod MR.   Abdominal aortic atherosclerosis (HCC)    a. MPI 12/12/2019--> very mild.   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 03/2021 Echo: Mod MR; b. 05/2021 cMRI: at least mod MR.   NICM (nonischemic cardiomyopathy) (HCC)    Nonosbructive CAD (coronary artery disease)    a. 03/2021 Cath: LM nl, LAD 59m, D1 nl, LCX min irregs, RCA 20p/d, RPDA nl, RPAV min irregs, RPL1/2/3 nl.   NSTEMI (non-ST elevated myocardial infarction) (HCC) 04/02/2021   a.) troponin peaked at 4508 ng/L --> felt to be demand ischemia in setting of sustained VT --> cMRI in 05/2021 with LGE pattern suggestive of  subendocardial infarct in LCx territory   Osteoarthritis of left knee    PAC (premature atrial contraction)    Pre-diabetes    Psoriasis    PSVT (paroxysmal supraventricular tachycardia) (HCC)    Recurrent umbilical hernia    a.) repaired 01/01/2020; recurred; b.) repeat repair 12/14/2020   Renal cyst    SNHL (sensory-neural hearing loss); wears hearing aids    Sustained ventricular tachycardia (HCC) 04/02/2021   a.) s/p AICD placement   TB lung, latent 2022   TIA (transient ischemic attack)    Principal Problem:   S/P revision of total knee, left  Estimated body mass index is 30.72 kg/m as calculated from the following:   Height as of this encounter: 5' 3 (1.6 m).   Weight as of this encounter: 78.7 kg. Advance diet Up with therapy Discharge home with home health Diet - Regular diet Follow up - in 2 weeks Activity - WBAT Disposition - Home with IV antibiotic therapy. Condition Upon Discharge - Stable DVT Prophylaxis - Lovenox   Bert Britain, PA-C Orthopaedic Surgery 08/22/2023, 11:09 AM

## 2023-08-22 NOTE — Progress Notes (Signed)
 Physical Therapy Treatment Patient Details Name: Gerald Hodges MRN: [id number] DOB: 14-Mar-1935 Today's Date: 08/22/2023   History of Present Illness Pt is an 17 y.old male presenting s/p L total knee revision. I& D secondary to cellulitis. POD 1 at time of eval    PT Comments  Today's tx involved continuation of ambulation and integration of therex alongside education. Pt able to demonstrate majority of LE therex and was able to verbalize understanding of the rest of the exercises. Pt ROM measurements improved, 0-90 degrees, and pt states he feels a little less pain when flexing knee. Ambulation successful, ambulating 100' with x2 standing rest breaks. Pt educated on DME use, ice application, and overall care for his L knee. PT to follow acutely as appropriate and pt progressing toward overall goals.    If plan is discharge home, recommend the following: A little help with walking and/or transfers;A little help with bathing/dressing/bathroom;Assistance with cooking/housework;Assist for transportation;Help with stairs or ramp for entrance   Can travel by private vehicle        Equipment Recommendations  None recommended by PT    Recommendations for Other Services       Precautions / Restrictions Precautions Precautions: Fall Recall of Precautions/Restrictions: Intact Restrictions Weight Bearing Restrictions Per Provider Order: Yes LLE Weight Bearing Per Provider Order: Weight bearing as tolerated     Mobility  Bed Mobility Overal bed mobility: Needs Assistance Bed Mobility: Supine to Sit     Supine to sit: HOB elevated, Contact guard     General bed mobility comments: verbal and tactile cueing, requiring less cues throughout movement    Transfers Overall transfer level: Needs assistance Equipment used: Rolling walker (2 wheels) Transfers: Sit to/from Stand, Bed to chair/wheelchair/BSC Sit to Stand: Contact guard assist Stand pivot transfers: Contact guard  assist         General transfer comment: verbal and tactile cues given for initiation of movement    Ambulation/Gait Ambulation/Gait assistance: Contact guard assist Gait Distance (Feet): 100 Feet Assistive device: Rolling walker (2 wheels) Gait Pattern/deviations: Step-through pattern, Decreased step length - right, Decreased step length - left       General Gait Details: verbal and tactile cueing given throughout ambulation, x2 standing rest break, no LOB, overall minimal pain expressed by pt, x1 seated rest break   Stairs             Wheelchair Mobility     Tilt Bed    Modified Rankin (Stroke Patients Only)       Balance Overall balance assessment: Needs assistance Sitting-balance support: Single extremity supported, Feet supported Sitting balance-Leahy Scale: Fair Sitting balance - Comments: sitting EOB   Standing balance support: Bilateral upper extremity supported Standing balance-Leahy Scale: Fair Standing balance comment: use of RW, no LOB, verbal and tactile cueing initially for use of DME                            Communication Communication Communication: Impaired;Other (comment) (spanish speaking) Factors Affecting Communication: Hearing impaired  Cognition Arousal: Alert Behavior During Therapy: WFL for tasks assessed/performed   PT - Cognitive impairments: No apparent impairments                         Following commands: Intact      Cueing Cueing Techniques: Verbal cues, Tactile cues, Gestural cues  Exercises Total Joint Exercises Ankle Circles/Pumps: AROM, Both, 5 reps, Supine Quad Sets:  AROM, Both, 5 reps, Supine Towel Squeeze: AROM, Both, 5 reps, Supine Short Arc Quad: Other (comment) (verbal explanation) Heel Slides: Other (comment) (verbal explanation) Hip ABduction/ADduction: Other (comment) (verbal explanation) Straight Leg Raises: AROM, Both, 5 reps, Supine Long Arc Quad: Other (comment) (verbal  explanation) Knee Flexion: Other (comment) (verbal explanation) Goniometric ROM: 0-90 degrees    General Comments        Pertinent Vitals/Pain Pain Assessment Pain Assessment: Faces Faces Pain Scale: Hurts little more Pain Location: L knee Pain Descriptors / Indicators: Discomfort, Aching Pain Intervention(s): Monitored during session, Repositioned, Ice applied    Home Living                          Prior Function            PT Goals (current goals can now be found in the care plan section) Acute Rehab PT Goals Patient Stated Goal: to walk better and decrease pain in L knee PT Goal Formulation: With patient/family Time For Goal Achievement: 09/04/23 Potential to Achieve Goals: Good Progress towards PT goals: Progressing toward goals    Frequency    BID      PT Plan      Co-evaluation              AM-PAC PT 6 Clicks Mobility   Outcome Measure  Help needed turning from your back to your side while in a flat bed without using bedrails?: A Little Help needed moving from lying on your back to sitting on the side of a flat bed without using bedrails?: A Little Help needed moving to and from a bed to a chair (including a wheelchair)?: A Little Help needed standing up from a chair using your arms (e.g., wheelchair or bedside chair)?: A Little Help needed to walk in hospital room?: A Little Help needed climbing 3-5 steps with a railing? : A Little 6 Click Score: 18    End of Session Equipment Utilized During Treatment: Gait belt Activity Tolerance: Patient tolerated treatment well Patient left: in chair;with call bell/phone within reach;with family/visitor present Nurse Communication: Mobility status PT Visit Diagnosis: Unsteadiness on feet (R26.81);Other abnormalities of gait and mobility (R26.89);Muscle weakness (generalized) (M62.81)     Time: 1308-6578 PT Time Calculation (min) (ACUTE ONLY): 43 min  Charges:                            Benedicto Capozzi Romero-Perozo, SPT  08/22/2023, 12:05 PM

## 2023-08-22 NOTE — TOC Transition Note (Signed)
 [facility]) - Discharge Note   Patient Details  Name: Gerald Hodges MRN: [id number] Date of Birth: 07/03/1935  [facility] CM/SW Contact:  Alexandra Ice, RN Phone Number: 08/22/2023, 12:28 PM   Clinical Narrative:     TOC spoke with Loyce Ruffini, [facility] CM. She stated they are aware of the need for IV ABX and currently working on it. They request that patient just receive his ABX prior to discharge today. They will call family to see if patient needs transportation home today. TOC updated MD, pharmacist and bedside nurse.    Barriers to Discharge: Barriers Resolved   Patient Goals and CMS Choice        Expected Discharge Plan and Services           Expected Discharge Date: 08/22/23                 DME Agency: NA       HH Arranged: IV Antibiotics, PT, OT          Prior Living Arrangements/Services                       Activities of Daily Living   ADL Screening (condition at time of admission) Independently performs ADLs?: Yes (appropriate for developmental age) Is the patient deaf or have difficulty hearing?: Yes Does the patient have difficulty seeing, even when wearing glasses/contacts?: No Does the patient have difficulty concentrating, remembering, or making decisions?: Yes  Permission Sought/Granted                  Emotional Assessment              Admission diagnosis:  Infection of prosthetic hip joint, subsequent encounter [T84.59XD, Z96.649] Infection of total left knee replacement, subsequent encounter [T84.54XD] S/P revision of total knee, left [Z96.652] Patient Active Problem List   Diagnosis Date Noted   S/P revision of total knee, left 08/20/2023   VT (ventricular tachycardia) (HCC) 12/23/2021   Ventricular tachycardia (HCC) 12/22/2021   Chronic diastolic heart failure (HCC)    Demand ischemia (HCC)    Sustained VT (ventricular tachycardia) (HCC) 04/02/2021   Nonsustained  ventricular tachycardia (HCC) 04/02/2021   Obesity 03/30/2021   TB lung, latent 11/08/2020   Lower extremity edema 05/28/2020   Decreased hearing of both ears 01/27/2020   Pruritic dermatitis 12/04/2019   Left inguinal hernia 12/01/2019   History of umbilical hernia repair 11/03/2019   Renal cyst 11/03/2019   Hypothyroidism 04/12/2019   Osteoarthritis of knee 10/11/2016   (HFpEF) heart failure with preserved ejection fraction (HCC) 08/02/2016   Allergic rhinitis 08/04/2015   Stable angina (HCC) 06/28/2015   Gout 10/09/2014   Essential hypertension 08/18/2014   Psoriasis 08/18/2014   Chronic kidney disease, stage III (moderate) (HCC) 08/18/2014   PAC (premature atrial contraction) 02/17/2014   GERD (gastroesophageal reflux disease) 03/19/2013   CAD (coronary artery disease) 03/18/2013   Hyperlipidemia LDL goal <70 10/06/2012   PCP:  Janella Median, NP (Inactive) Pharmacy:   [facility], [place] - [address] [place] [address] Phone: [phone] Fax: [phone]     Social Determinants of Health (SDOH) Interventions    Readmission Risk Interventions     No data to display           Final next level of care: Home w Home Health Services Barriers to Discharge: Barriers Resolved   Patient Goals and CMS Choice  Discharge Placement                    Patient and family notified of of transfer: 08/22/23  Discharge Plan and Services Additional resources added to the After Visit Summary for                    DME Agency: NA       HH Arranged: IV Antibiotics, PT, OT          Social Drivers of Health (SDOH) Interventions SDOH Screenings   Food Insecurity: No Food Insecurity (08/20/2023)  Housing: Low Risk  (08/20/2023)  Transportation Needs: No Transportation Needs (08/20/2023)  Utilities: Not At Risk (08/20/2023)  Depression (PHQ2-9): Low Risk  (07/27/2020)  Financial Resource Strain: Low Risk   (08/07/2023)   Received from [facility]  Social Connections: Unknown (08/21/2023)  Tobacco Use: Low Risk  (08/20/2023)     Readmission Risk Interventions     No data to display

## 2023-08-22 NOTE — Progress Notes (Addendum)
 Subjective: 2 Days Post-Op Procedure(s) (LRB): TOTAL KNEE REVISION (Left) Patient reports pain as mild.   Patient is well, and has had no acute complaints or problems.  Use the interpreter through [facility].  Pain with moving. Plan is to go Rehab versus home after hospital stay. Negative for chest pain and shortness of breath Fever: no Gastrointestinal: Negative for nausea and vomiting  Objective: Vital signs in last 24 hours: Temp:  [97.6 F (36.4 C)-98 F (36.7 C)] 98 F (36.7 C) (06/11 0015) Pulse Rate:  [65-74] 74 (06/11 0015) Resp:  [14-16] 14 (06/11 0015) BP: (125-148)/(72-88) 142/86 (06/11 0015) SpO2:  [97 %-98 %] 97 % (06/11 0015)  Intake/Output from previous day:  Intake/Output Summary (Last 24 hours) at 08/22/2023 0641 Last data filed at 08/21/2023 1627 Gross per 24 hour  Intake 1072 ml  Output 300 ml  Net 772 ml    Intake/Output this shift: No intake/output data recorded.  Labs: Recent Labs    08/21/23 0650 08/22/23 0603  HGB 10.9* 11.2*   Recent Labs    08/21/23 0650 08/22/23 0603  WBC 7.4 11.9*  RBC 3.51* 3.57*  HCT 31.7* 32.3*  PLT 387 414*   Recent Labs    08/21/23 0650  NA 129*  K 4.1  CL 98  CO2 22  BUN 17  CREATININE 1.13  GLUCOSE 142*  CALCIUM  8.3*   No results for input(s): LABPT, INR in the last 72 hours.   EXAM General - Patient is Alert and Oriented yet hard of hearing Extremity - Neurovascular intact Sensation intact distally Dorsiflexion/Plantar flexion intact Compartment soft Dressing/Incision - clean, dry, with the Hemovac removed with no complication.  Hemovac was down to 10 to 15 cc overnight. Motor Function - intact, moving foot and toes well on exam.  Ambulated 150 feet with physical therapy.  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 b. TTE 12/17/2019: EF 55-60%; c. 03/2021 Echo: EF 55-60%, GrI DD, mod MR; d. 05/2021  cMRI: LVEF 46%, RVEF 46%, >50% thickness subendocardial LGE sugg of prior infarct in LCX distribution. At least mod MR.   Abdominal aortic atherosclerosis (HCC)    a. MPI 12/12/2019--> very mild.   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 03/2021 Echo: Mod MR; b. 05/2021 cMRI: at least mod MR.   NICM (nonischemic cardiomyopathy) (HCC)    Nonosbructive CAD (coronary artery disease)    a. 03/2021 Cath: LM nl, LAD 19m, D1 nl, LCX min irregs, RCA 20p/d, RPDA nl, RPAV min irregs, RPL1/2/3 nl.   NSTEMI (non-ST elevated myocardial infarction) (HCC) 04/02/2021   a.) troponin peaked at 4508 ng/L --> felt to be demand ischemia in setting of sustained VT --> cMRI in 05/2021 with LGE pattern suggestive of subendocardial infarct in LCx territory   Osteoarthritis of left knee  PAC (premature atrial contraction)    Pre-diabetes    Psoriasis    PSVT (paroxysmal supraventricular tachycardia) (HCC)    Recurrent umbilical hernia    a.) repaired 01/01/2020; recurred; b.) repeat repair 12/14/2020   Renal cyst    SNHL (sensory-neural hearing loss); wears hearing aids    Sustained ventricular tachycardia (HCC) 04/02/2021   a.) s/p AICD placement   TB lung, latent 2022   TIA (transient ischemic attack)     Assessment/Plan: 2 Days Post-Op Procedure(s) (LRB): TOTAL KNEE REVISION (Left) Principal Problem:   S/P revision of total knee, left  Estimated  body mass index is 30.72 kg/m as calculated from the following:   Height as of this encounter: 5' 3 (1.6 m).   Weight as of this encounter: 78.7 kg. Advance diet Up with therapy  Status post left knee revision with incision and drainage for cellulitis.  IV antibiotic therapy.  Appreciate consultation by infectious disease.  PICC line placement, probable today.  Hold discharge until IV antibiotic therapy set up for home.  Continue antibiotics.  DVT Prophylaxis - Lovenox  and Plavix  Weight-Bearing as tolerated to left leg  Bert Britain, PA-C Orthopaedic Surgery 08/22/2023, 6:41 AM

## 2023-08-22 NOTE — Progress Notes (Addendum)
 PHARMACY CONSULT NOTE FOR:  OUTPATIENT  PARENTERAL ANTIBIOTIC THERAPY (OPAT)  Indication: Left Knee PJI  Regimen: Daptomycin 700 mg IV every 24 hours + Ceftriaxone 2 gm IV every 24 hours  End date: 10/01/23  IV antibiotic discharge orders are pended. To discharging provider:  please sign these orders via discharge navigator,  Select New Orders & click on the button choice - Manage This Unsigned Work.     Thank you for allowing pharmacy to be a part of this patient's care.  Denson Flake, PharmD, BCPS, BCIDP Infectious Diseases Clinical Pharmacist Phone: [phone] 08/22/2023, 11:19 AM

## 2023-08-22 NOTE — Progress Notes (Signed)
 DISCHARGE NOTE:    Pt dc with IV removed and dc instructions given to pt and pt's wife. Interpreter present for all of dc. Pt has both TED hose on and in place. Pt has cane, polar care, medication scripts, and dc paperwork. Pt PICC line clamped, clean, dry, and intact. Nurse called [facility] and informed pt of importance of not missing a dose of the IV antibiotics and going each day to [facility]. Pt wheeled down to medical mall entrance by staff. Pt's son provided transportation.

## 2023-08-25 LAB — AEROBIC/ANAEROBIC CULTURE W GRAM STAIN (SURGICAL/DEEP WOUND)
Culture: NO GROWTH
Culture: NO GROWTH
Culture: NO GROWTH
Culture: NO GROWTH
Gram Stain: NONE SEEN
Gram Stain: NONE SEEN
Gram Stain: NONE SEEN

## 2023-09-05 ENCOUNTER — Ambulatory Visit
Admission: RE | Admit: 2023-09-05 | Discharge: 2023-09-05 | Disposition: A | Discharge status: Home / Self Care | Source: Ambulatory Visit | Attending: Orthopedic Surgery | Admitting: Orthopedic Surgery

## 2023-09-05 ENCOUNTER — Telehealth: Payer: Self-pay | Admitting: Infectious Diseases

## 2023-09-05 ENCOUNTER — Other Ambulatory Visit: Payer: Self-pay | Admitting: Orthopedic Surgery

## 2023-09-05 DIAGNOSIS — M7989 Other specified soft tissue disorders: Secondary | ICD-10-CM | POA: Diagnosis present

## 2023-09-05 DIAGNOSIS — Z96652 Presence of left artificial knee joint: Secondary | ICD-10-CM

## 2023-09-05 DIAGNOSIS — M79662 Pain in left lower leg: Secondary | ICD-10-CM

## 2023-09-05 NOTE — Telephone Encounter (Signed)
 REviwed with pharmacy. S warneri actually grew in 2 separate op cultures making it more likely cause Will dc dapto and cont just ctx 2 gm q 24

## 2023-09-05 NOTE — Telephone Encounter (Signed)
 Called back [facility] provder Sheri's cell at [phone] or the clinic at [phone]. Gerald Hodges Pt doing well but ck elevated.  Seen by ortho today. Getting doppler LE for edema. Per ortho report knee stable. Unclear if he was stopped off his statin but they think so  Cx from inpt with only Staph warneri in one of 3 OR cultures Labs 6/23 WBC 9, cr 1.1, NA 130 Alk phos 127, alt 111, crp 25  (range 0-10) Ck 1200 Current dapto 700 mg q 24. CTX 2000 mg qd     pecimen Description FLUID Performed at [facility], [address] [place], [place] [address]  Special Requests LEFT POSTERIOR KNEE CULTURE Performed at [facility], [address] [place], [place] [address]  Gram Stain NO WBC SEEN NO ORGANISMS SEEN  Culture RARE STAPHYLOCOCCUS WARNERI NO ANAEROBES ISOLATED Performed at [facility], [address] [place], [place] [address]  Report Status 08/25/2023 FINAL  Organism ID, Bacteria STAPHYLOCOCCUS WARNERI  Resulting Agency [facility]     Susceptibility    Staphylococcus warneri    MIC    CIPROFLOXACIN <=0.5 SENSI... Sensitive    CLINDAMYCIN <=0.25 SENS... Sensitive    ERYTHROMYCIN <=0.25 SENS... Sensitive    GENTAMICIN  4 SENSITIVE Sensitive    Inducible Clindamycin NEGATIVE Sensitive    OXACILLIN <=0.25 SENS... Sensitive    RIFAMPIN <=0.5 SENSI... Sensitive    TETRACYCLINE <=1 SENSITIVE Sensitive    TRIMETH/SULFA <=10 SENSIT... Sensitive    VANCOMYCIN  2 SENSITIVE Sensitive

## 2023-09-28 ENCOUNTER — Ambulatory Visit: Payer: Medicaid Other

## 2023-10-05 ENCOUNTER — Ambulatory Visit (INDEPENDENT_AMBULATORY_CARE_PROVIDER_SITE_OTHER): Payer: Self-pay

## 2023-10-05 DIAGNOSIS — I440 Atrioventricular block, first degree: Secondary | ICD-10-CM

## 2023-10-10 IMAGING — MR MR CARD MORPHOLOGY WO/W CM
47 of 48 series · 47 of 48 positions shown · IV contrast (Contrast agent)
Comparison: none

CLINICAL DATA: Ventricular tachycardia

EXAM:
CARDIAC MRI
TECHNIQUE: The patient was scanned on a 1.5 Tesla GE magnet. A dedicated
cardiac coil was used. Functional imaging was done using Fiesta
sequences. [DATE], and 4 chamber views were done to assess for RWMA's.
Modified Gamra rule using a short axis stack was used to
calculate an ejection fraction on a dedicated work station using
Circle software. The patient received 8 cc of Gadavist. After 10
minutes inversion recovery sequences were used to assess for
infiltration and scar tissue.

[Series 4: t2_haste_db_tra_bh · axial · 8.0mm · 1.41mm/px · 1 of 16 slices shown]
[im 1/16]
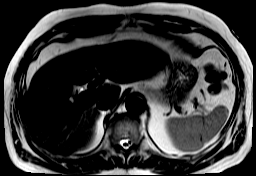

[Series 11: bSSFP · oblique · 8.0mm · 1.61mm/px · 1 of 25 slices shown (1 of 19)]
[im 1/25]
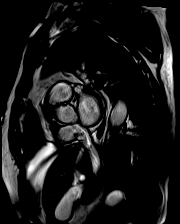

[Series 12: bSSFP · oblique · 8.0mm · 1.61mm/px · 1 of 25 slices shown (2 of 19)]
[im 1/25]
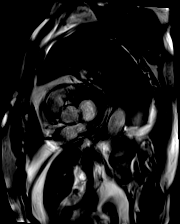

[Series 13: bSSFP · oblique · 8.0mm · 1.61mm/px · 1 of 25 slices shown (3 of 19)]
[im 1/25]
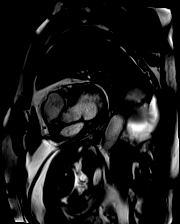

[Series 14: bSSFP · oblique · 8.0mm · 1.61mm/px · 1 of 25 slices shown (4 of 19)]
[im 1/25]
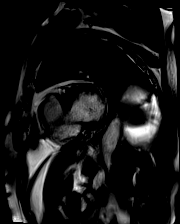

[Series 15: bSSFP · oblique · 8.0mm · 1.61mm/px · 1 of 25 slices shown (5 of 19)]
[im 1/25]
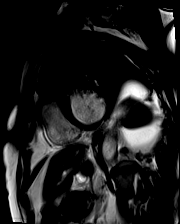

[Series 16: bSSFP · oblique · 8.0mm · 1.61mm/px · 1 of 25 slices shown (6 of 19)]
[im 1/25]
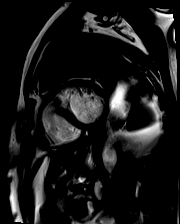

[Series 17: bSSFP · oblique · 8.0mm · 1.61mm/px · 1 of 25 slices shown (7 of 19)]
[im 1/25]
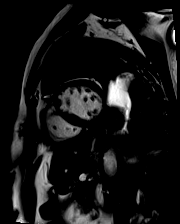

[Series 18: bSSFP · oblique · 8.0mm · 1.61mm/px · 1 of 25 slices shown (8 of 19)]
[im 1/25]
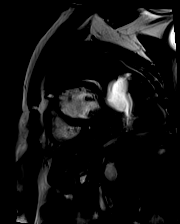

[Series 19: bSSFP · oblique · 8.0mm · 1.61mm/px · 1 of 25 slices shown (9 of 19)]
[im 1/25]
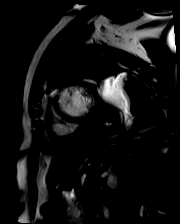

[Series 20: bSSFP · oblique · 8.0mm · 1.61mm/px · 1 of 25 slices shown (10 of 19)]
[im 1/25]
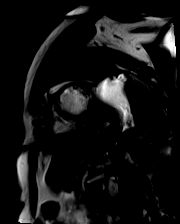

[Series 21: bSSFP · oblique · 8.0mm · 1.61mm/px · 1 of 25 slices shown (11 of 19)]
[im 1/25]
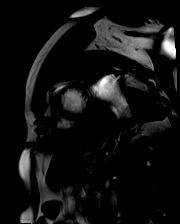

[Series 22: bSSFP · oblique · 8.0mm · 1.61mm/px · 1 of 25 slices shown (12 of 19)]
[im 1/25]
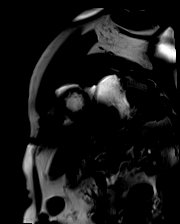

[Series 23: bSSFP · oblique · 8.0mm · 1.61mm/px · 1 of 25 slices shown (13 of 19)]
[im 1/25]
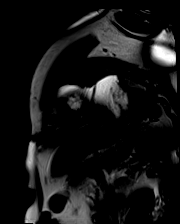

[Series 24: bSSFP · oblique · 8.0mm · 1.61mm/px · 1 of 25 slices shown (14 of 19)]
[im 1/25]
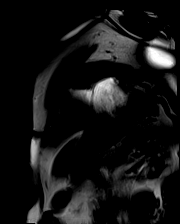

[Series 25: bSSFP · oblique · 8.0mm · 1.61mm/px · 1 of 25 slices shown (15 of 19)]
[im 1/25]
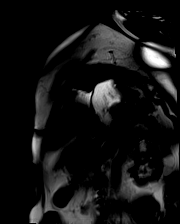

[Series 26: bSSFP · oblique · 8.0mm · 1.61mm/px · 1 of 25 slices shown (16 of 19)]
[im 1/25]
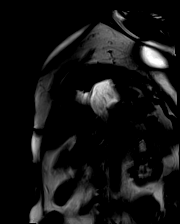

[Series 27: bSSFP · axial · 6.0mm · 1.41mm/px · 1 of 25 slices shown (17 of 19)]
[im 1/25]
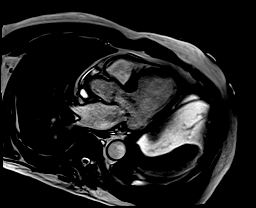

[Series 28: bSSFP · oblique · 6.0mm · 1.41mm/px · 1 of 25 slices shown (18 of 19)]
[im 1/25]
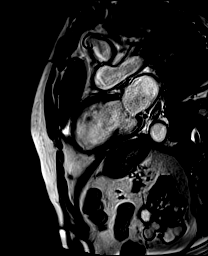

[Series 29: bSSFP · axial · 6.0mm · 1.41mm/px · 1 of 25 slices shown (19 of 19)]
[im 1/25]
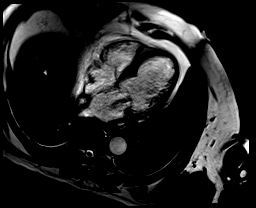

[Series 30: cine_trufi_short axis_cs_2_shot · coronal · 8.0mm · 1.48mm/px · 1 of 25 slices shown (1 of 15)]
[im 1/25]
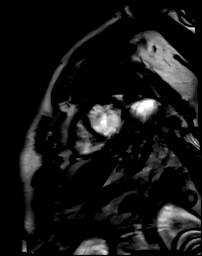

[Series 30: cine_trufi_short axis_cs_2_shot · coronal · 8.0mm · 1.48mm/px · 1 of 25 slices shown (2 of 15)]
[im 1/25]
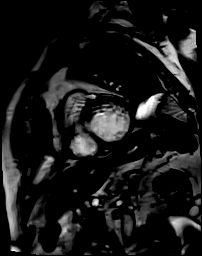

[Series 30: cine_trufi_short axis_cs_2_shot · coronal · 8.0mm · 1.48mm/px · 1 of 25 slices shown (3 of 15)]
[im 1/25]
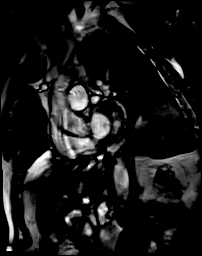

[Series 30: cine_trufi_short axis_cs_2_shot · coronal · 8.0mm · 1.48mm/px · 1 of 25 slices shown (4 of 15)]
[im 1/25]
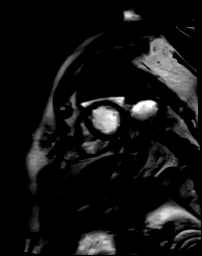

[Series 30: cine_trufi_short axis_cs_2_shot · coronal · 8.0mm · 1.48mm/px · 1 of 25 slices shown (5 of 15)]
[im 1/25]
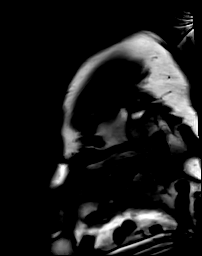

[Series 30: cine_trufi_short axis_cs_2_shot · coronal · 8.0mm · 1.48mm/px · 1 of 25 slices shown (6 of 15)]
[im 1/25]
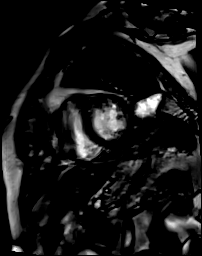

[Series 30: cine_trufi_short axis_cs_2_shot · coronal · 8.0mm · 1.48mm/px · 1 of 25 slices shown (7 of 15)]
[im 1/25]
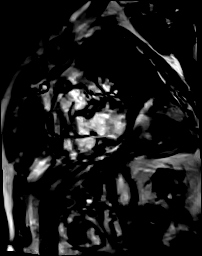

[Series 30: cine_trufi_short axis_cs_2_shot · coronal · 8.0mm · 1.48mm/px · 1 of 25 slices shown (8 of 15)]
[im 1/25]
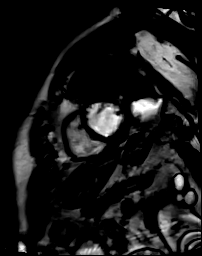

[Series 30: cine_trufi_short axis_cs_2_shot · coronal · 8.0mm · 1.48mm/px · 1 of 25 slices shown (9 of 15)]
[im 1/25]
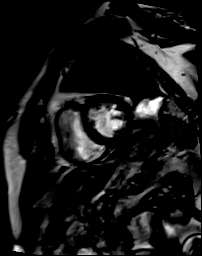

[Series 30: cine_trufi_short axis_cs_2_shot · coronal · 8.0mm · 1.48mm/px · 1 of 25 slices shown (10 of 15)]
[im 1/25]
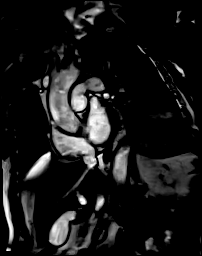

[Series 30: cine_trufi_short axis_cs_2_shot · coronal · 8.0mm · 1.48mm/px · 1 of 25 slices shown (11 of 15)]
[im 1/25]
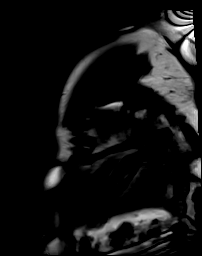

[Series 30: cine_trufi_short axis_cs_2_shot · coronal · 8.0mm · 1.48mm/px · 1 of 25 slices shown (12 of 15)]
[im 1/25]
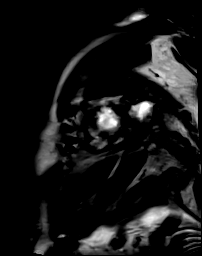

[Series 30: cine_trufi_short axis_cs_2_shot · coronal · 8.0mm · 1.48mm/px · 1 of 25 slices shown (13 of 15)]
[im 1/25]
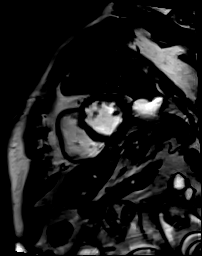

[Series 30: cine_trufi_short axis_cs_2_shot · coronal · 8.0mm · 1.48mm/px · 1 of 25 slices shown (14 of 15)]
[im 1/25]
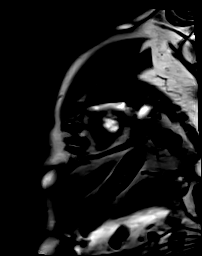

[Series 30: cine_trufi_short axis_cs_2_shot · coronal · 8.0mm · 1.48mm/px · 1 of 25 slices shown (15 of 15)]
[im 1/25]
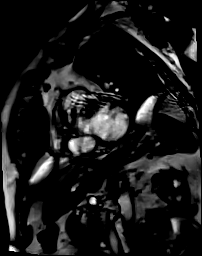

[Series 31: (id)_long_t1 · oblique · 8.0mm · 1.56mm/px · 1 of 24 slices shown]
[im 1/24]
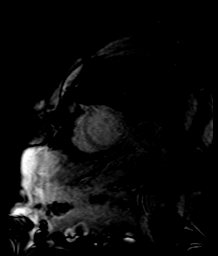

[Series 32: (id)_long_t1_moco · oblique · 8.0mm · 1.56mm/px · 1 of 24 slices shown]
[im 1/24]
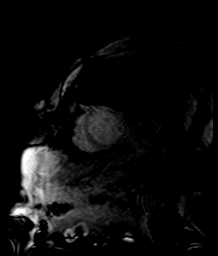

[Series 33: (id)_long_t1_moco_t1 · oblique · 8.0mm · 1.56mm/px · 1 of 6 slices shown]
[im 1/6]
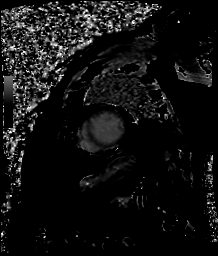

[Series 35: (id)_trufi · oblique · 8.0mm · 2.08mm/px · 1 of 9 slices shown]
[im 1/9]
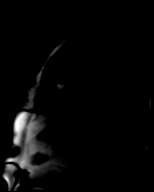

[Series 36: (id)_trufi_moco · oblique · 8.0mm · 2.08mm/px · 1 of 9 slices shown]
[im 1/9]
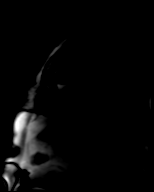

[Series 37: (id)_trufi_moco_t2 · oblique · 8.0mm · 2.08mm/px · 1 of 3 slices shown]
[im 1/3]
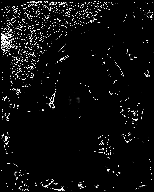

[Series 39: pre short axis · oblique · non-contrast · 8.0mm · 2.25mm/px · 1 of 10 slices shown (1 of 6)]
[im 1/10]
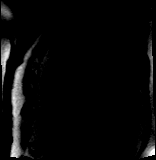

[Series 40: pre short axis · oblique · non-contrast · 8.0mm · 2.25mm/px · 1 of 10 slices shown (2 of 6)]
[im 1/10]
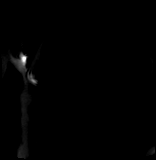

[Series 41: pre short axis · oblique · non-contrast · 8.0mm · 2.25mm/px · 1 of 10 slices shown (3 of 6)]
[im 1/10]
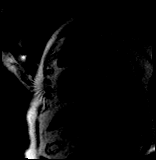

[Series 42: pre short axis · oblique · non-contrast · 8.0mm · 2.25mm/px · 1 of 10 slices shown (4 of 6)]
[im 1/10]
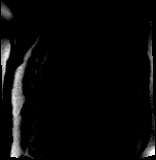

[Series 43: pre short axis · oblique · non-contrast · 8.0mm · 2.25mm/px · 1 of 10 slices shown (5 of 6)]
[im 1/10]
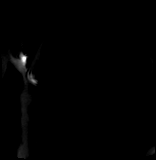

[Series 44: pre short axis · oblique · non-contrast · 8.0mm · 2.25mm/px · 1 of 10 slices shown (6 of 6)]
[im 1/10]
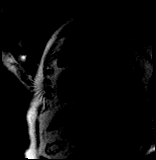

[47 of 48 positions shown; findings below may reference images not displayed]

FINDINGS: Limited images of the lung fields showed no gross abnormalities.

Normal left ventricular size and wall thickness. Basal inferolateral
akinesis and thinning, basal inferior akinesis, basal anterolateral
hypokinesis. LV EF 46%. Normal right ventricular size and systolic
function, EF 53%. Mild tricuspid regurgitation. The aortic valve was
poorly visualized, mild regurgitation with no significant stenosis.
There was at least moderate mitral regurgitation, unfortunately flow
sequences to quantify were not done.

Delayed enhancement imaging showed >50% wall thickness
subendocardial late gadolinium enhancement (LGE) in the basal
inferior, basal-mid inferolateral, and basal anterolateral walls.

Measurements:
LVEDV 194 mL
LVSV 90 mL
LVEF 46%

RVEDV 131 mL
RVSV 69 mL
RVEF 53%

T1 4111, ECV 31% in septum
IMPRESSION: 1. Normal LV size with EF 46%. Wall motion abnormalities as noted
above.

2.  Normal RV size and systolic function, EF 46%.

3. LGE pattern is most suggestive of prior infarction in the left
circumflex territory (coronary-pattern LGE). With > 50% wall
thickness subendocardial LGE in the affected segments, they are
unlikely to improve in function with revascularization (unlikely to
be viable). Interestingly, no definite explanatory lesion was found
at recent catheterization.

4.  Mildly elevated ECV percentage, this is likely nonspecific.

5. At last moderate MR. Unfortunately, flow sequences were not done
to quantify. This may be infarct-related MR due to
inferolateral/inferior wall motion abnormalities.

VT likely originates from area of scarring in the basal lateral,
basal inferior LV.

Bayanda Lishivha

## 2023-10-11 ENCOUNTER — Ambulatory Visit: Payer: Self-pay | Admitting: Cardiology

## 2023-10-11 LAB — CUP PACEART REMOTE DEVICE CHECK
Battery Remaining Longevity: 156 mo
Battery Remaining Percentage: 100 %
Brady Statistic RA Percent Paced: 24 %
Brady Statistic RV Percent Paced: 0 %
Date Time Interrogation Session: 20250730153300
HighPow Impedance: 71 Ohm
Implantable Lead Connection Status: 753985
Implantable Lead Connection Status: 753985
Implantable Lead Implant Date: 20231016
Implantable Lead Implant Date: 20231016
Implantable Lead Location: 753859
Implantable Lead Location: 753860
Implantable Lead Model: 672
Implantable Lead Model: 7841
Implantable Lead Serial Number: 104965
Implantable Lead Serial Number: 228703
Implantable Pulse Generator Implant Date: 20231016
Lead Channel Impedance Value: 367 Ohm
Lead Channel Impedance Value: 621 Ohm
Lead Channel Pacing Threshold Amplitude: 0.5 V
Lead Channel Pacing Threshold Amplitude: 1.1 V
Lead Channel Pacing Threshold Pulse Width: 0.4 ms
Lead Channel Pacing Threshold Pulse Width: 0.4 ms
Lead Channel Setting Pacing Amplitude: 2 V
Lead Channel Setting Pacing Amplitude: 2.2 V
Lead Channel Setting Pacing Pulse Width: 0.4 ms
Lead Channel Setting Sensing Sensitivity: 0.6 mV
Pulse Gen Serial Number: 619056
Zone Setting Status: 755011

## 2023-11-16 ENCOUNTER — Ambulatory Visit: Payer: Medicare (Managed Care) | Attending: Cardiology | Admitting: Cardiology

## 2023-11-16 NOTE — Progress Notes (Deleted)
 Cardiology Office Note    Date:  11/16/2023   ID:  Altin Sease, DOB 1935/06/25, MRN [id number]  PCP:  Joeann Ego, NP (Inactive)  Cardiologist:  Lonni Hanson, MD  Electrophysiologist:  OLE ONEIDA HOLTS, MD   Chief Complaint: ***  History of Present Illness:   Maxwell Lemen is a 88 y.o. 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 b. TTE 12/17/2019: EF 55-60%; c. 03/2021 Echo: EF 55-60%, GrI DD, mod MR; d. 05/2021 cMRI: LVEF 46%, RVEF 46%, >50% thickness subendocardial LGE sugg of prior infarct in LCX distribution. At least mod MR.   Abdominal aortic atherosclerosis (HCC)    a. MPI 12/12/2019--> very mild.   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 03/2021 Echo: Mod MR; b. 05/2021 cMRI: at least mod MR.   NICM (nonischemic cardiomyopathy) (HCC)    Nonosbructive CAD (coronary artery disease)    a. 03/2021 Cath: LM nl, LAD 47m, D1 nl, LCX min irregs, RCA 20p/d, RPDA nl, RPAV min irregs, RPL1/2/3 nl.   NSTEMI (non-ST elevated  myocardial infarction) (HCC) 04/02/2021   a.) troponin peaked at 4508 ng/L --> felt to be demand ischemia in setting of sustained VT --> cMRI in 05/2021 with LGE pattern suggestive of subendocardial infarct in LCx territory   Osteoarthritis of left knee    PAC (premature atrial contraction)    Pre-diabetes    Psoriasis    PSVT (paroxysmal supraventricular tachycardia) (HCC)    Recurrent umbilical hernia    a.) repaired 01/01/2020; recurred; b.) repeat repair 12/14/2020   Renal cyst    SNHL (sensory-neural hearing loss); wears hearing aids    Sustained ventricular tachycardia (HCC) 04/02/2021   a.) s/p AICD placement   TB lung, latent 2022   TIA (transient ischemic attack)     Current Medications: No outpatient medications have been marked as taking for the 11/16/23 encounter (Appointment) with Gerard Frederick, NP.    Allergies:   Asa [aspirin ] and Zestril  [lisinopril ]   Social History   Socioeconomic History   Marital status: Married    Spouse name: Not on file   Number of children: Not on file   Years of education: Not on file   Highest education level: Not on file  Occupational History   Not on file  Tobacco Use   Smoking status: Never   Smokeless tobacco: Never  Vaping Use   Vaping status: Never Used  Substance and Sexual Activity   Alcohol use: No   Drug use: No   Sexual activity: Not Currently  Other Topics Concern   Not on file  Social History Narrative   Not on file   Social Drivers of Health   Financial Resource Strain: Low Risk  (08/07/2023)   Received from [facility]   Overall Financial Resource Strain (CARDIA)    Difficulty of Paying Living Expenses: Not hard at all  Food Insecurity: No Food Insecurity (08/20/2023)   Hunger Vital Sign    Worried About Running Out of Food in the Last Year: Never true    Ran Out of Food in the Last Year: Never true  Transportation Needs: No Transportation Needs (08/20/2023)   PRAPARE - Therapist, art (Medical): No    Lack of Transportation (Non-Medical): No  Physical Activity: Not on file  Stress: Not on file  Social Connections: Unknown (08/21/2023)   Social Connection and Isolation Panel    Frequency of Communication with Friends and Family: Patient unable to answer    Frequency of Social Gatherings with Friends and Family: Patient unable to answer    Attends Religious Services: Patient unable to answer    Active Member of Clubs or Organizations: Patient unable to answer    Attends Banker Meetings: Not on file    Marital Status: Patient unable to answer     Family History:  The patient's family history includes Cancer in his mother. There is no history of Heart disease.  ROS:   12-point review of systems is negative unless otherwise noted in the HPI.  EKGs/Other Studies Reviewed:    Studies reviewed were summarized above. The additional studies were reviewed today: ***  EKG:  EKG personally reviewed by me today    PHYSICAL EXAM:    VS:  There were no vitals taken for this visit.  BMI: There is no height or weight on file to calculate BMI.  GEN: Well nourished, well developed in no acute distress NECK: No JVD; No carotid bruits CARDIAC: ***RRR, no murmurs, rubs, gallops RESPIRATORY:  Clear to auscultation without rales, wheezing or rhonchi  ABDOMEN: Soft, non-tender, non-distended EXTREMITIES:  *** No edema; No deformity  Wt Readings from Last 3 Encounters:  08/20/23 173 lb 6.4 oz (78.7 kg)  08/16/23 173 lb 6.4 oz (78.7 kg)  08/16/23 171 lb 6.4 oz (77.7 kg)        ASSESSMENT & PLAN:   ***   {Are you ordering a CV Procedure (e.g. stress test, cath, DCCV, TEE, etc)?   Press F2        :[id number]}   Disposition: F/u with Dr. Mady or an APP in ***.   Medication Adjustments/Labs and Tests Ordered: Current medicines are reviewed at length with the patient today.  Concerns regarding medicines are outlined above. Medication  changes, Labs and Tests ordered today are summarized above and listed in the Patient Instructions accessible in Encounters.   Bonney Lesley Maffucci, PA-C 11/16/2023 8:00 AM     [facility] [place] [address] [place], [place] [address] [phone]

## 2023-12-13 NOTE — Progress Notes (Signed)
 Remote ICD Transmission

## 2023-12-28 ENCOUNTER — Ambulatory Visit: Payer: Medicaid Other

## 2024-02-12 ENCOUNTER — Ambulatory Visit

## 2024-02-12 DIAGNOSIS — I428 Other cardiomyopathies: Secondary | ICD-10-CM

## 2024-02-13 LAB — CUP PACEART REMOTE DEVICE CHECK
Battery Remaining Longevity: 150 mo
Battery Remaining Percentage: 100 %
Brady Statistic RA Percent Paced: 42 %
Brady Statistic RV Percent Paced: 1 %
Date Time Interrogation Session: 20251202172500
HighPow Impedance: 71 Ohm
Implantable Lead Connection Status: 753985
Implantable Lead Connection Status: 753985
Implantable Lead Implant Date: 20231016
Implantable Lead Implant Date: 20231016
Implantable Lead Location: 753859
Implantable Lead Location: 753860
Implantable Lead Model: 672
Implantable Lead Model: 7841
Implantable Lead Serial Number: 104965
Implantable Lead Serial Number: 228703
Implantable Pulse Generator Implant Date: 20231016
Lead Channel Impedance Value: 360 Ohm
Lead Channel Impedance Value: 539 Ohm
Lead Channel Pacing Threshold Amplitude: 0.4 V
Lead Channel Pacing Threshold Amplitude: 1.2 V
Lead Channel Pacing Threshold Pulse Width: 0.4 ms
Lead Channel Pacing Threshold Pulse Width: 0.4 ms
Lead Channel Setting Pacing Amplitude: 2 V
Lead Channel Setting Pacing Amplitude: 2.4 V
Lead Channel Setting Pacing Pulse Width: 0.4 ms
Lead Channel Setting Sensing Sensitivity: 0.6 mV
Pulse Gen Serial Number: 619056
Zone Setting Status: 755011

## 2024-02-15 NOTE — Progress Notes (Signed)
 Remote ICD Transmission

## 2024-02-17 ENCOUNTER — Ambulatory Visit: Payer: Self-pay | Admitting: Cardiology

## 2024-02-20 ENCOUNTER — Telehealth: Payer: Self-pay | Admitting: Cardiology

## 2024-02-20 NOTE — Telephone Encounter (Signed)
 Call received from the Call Center that the patient's PCP was calling stating the patient has lost his remote transmitter. Advised the Call Center to obtain a good contact # for the patient, but no message ever sent to us .   Danette, do you mind calling the patient to follow up regarding his transmitter. It looks like his last transmission came in on 02/12/24.   Thank you!

## 2024-02-22 NOTE — Telephone Encounter (Signed)
Unable to reach pt lvm for pt to call back

## 2024-03-04 NOTE — Telephone Encounter (Signed)
 Pt monitor is plugged in and has connection

## 2024-03-18 ENCOUNTER — Telehealth: Payer: Self-pay | Admitting: Cardiology

## 2024-03-18 NOTE — Telephone Encounter (Signed)
 Calling to make nurse [facility] aware that pt has recently moved again and she does not think that device was taken with him. Please advise

## 2024-03-18 NOTE — Telephone Encounter (Signed)
 Spoke with Berwyn from [facility]. After looking through chart on 12/23 it was documented that patient device was plugged in and transmission was working. She was just wanting to make sure. No further needs at this time.

## 2024-05-13 ENCOUNTER — Ambulatory Visit

## 2024-08-07 ENCOUNTER — Ambulatory Visit: Admitting: Cardiology

## 2024-08-12 ENCOUNTER — Ambulatory Visit

## 2024-11-11 ENCOUNTER — Ambulatory Visit

## 2025-02-10 ENCOUNTER — Ambulatory Visit

## 2079-05-09 ENCOUNTER — Encounter: Payer: Self-pay | Admitting: Sports Medicine
